# Patient Record
Sex: Male | Born: 1950 | Race: Black or African American | Hispanic: No | Marital: Married | Smoking: Former smoker
Health system: Southern US, Community
[De-identification: ages and names within clinical notes are randomized; demographics above are authoritative.]

## PROBLEM LIST (undated history)

## (undated) DIAGNOSIS — E785 Hyperlipidemia, unspecified: Secondary | ICD-10-CM

## (undated) DIAGNOSIS — I495 Sick sinus syndrome: Secondary | ICD-10-CM

## (undated) DIAGNOSIS — F17201 Nicotine dependence, unspecified, in remission: Secondary | ICD-10-CM

## (undated) DIAGNOSIS — I739 Peripheral vascular disease, unspecified: Secondary | ICD-10-CM

## (undated) DIAGNOSIS — M1990 Unspecified osteoarthritis, unspecified site: Secondary | ICD-10-CM

## (undated) DIAGNOSIS — G629 Polyneuropathy, unspecified: Secondary | ICD-10-CM

## (undated) DIAGNOSIS — D649 Anemia, unspecified: Secondary | ICD-10-CM

## (undated) DIAGNOSIS — I10 Essential (primary) hypertension: Secondary | ICD-10-CM

## (undated) DIAGNOSIS — I2510 Atherosclerotic heart disease of native coronary artery without angina pectoris: Secondary | ICD-10-CM

## (undated) DIAGNOSIS — E119 Type 2 diabetes mellitus without complications: Secondary | ICD-10-CM

## (undated) DIAGNOSIS — G4733 Obstructive sleep apnea (adult) (pediatric): Secondary | ICD-10-CM

## (undated) DIAGNOSIS — N400 Enlarged prostate without lower urinary tract symptoms: Secondary | ICD-10-CM

## (undated) DIAGNOSIS — K219 Gastro-esophageal reflux disease without esophagitis: Secondary | ICD-10-CM

## (undated) DIAGNOSIS — J449 Chronic obstructive pulmonary disease, unspecified: Secondary | ICD-10-CM

## (undated) DIAGNOSIS — E669 Obesity, unspecified: Secondary | ICD-10-CM

## (undated) DIAGNOSIS — I679 Cerebrovascular disease, unspecified: Secondary | ICD-10-CM

## (undated) HISTORY — DX: Type 2 diabetes mellitus without complications: E11.9

## (undated) HISTORY — DX: Obesity, unspecified: E66.9

## (undated) HISTORY — DX: Polyneuropathy, unspecified: G62.9

## (undated) HISTORY — DX: Peripheral vascular disease, unspecified: I73.9

## (undated) HISTORY — DX: Sick sinus syndrome: I49.5

## (undated) HISTORY — PX: ILIAC ARTERY STENT: SHX1786

## (undated) HISTORY — DX: Obstructive sleep apnea (adult) (pediatric): G47.33

## (undated) HISTORY — DX: Hyperlipidemia, unspecified: E78.5

## (undated) HISTORY — DX: Unspecified osteoarthritis, unspecified site: M19.90

## (undated) HISTORY — DX: Chronic obstructive pulmonary disease, unspecified: J44.9

## (undated) HISTORY — DX: Nicotine dependence, unspecified, in remission: F17.201

## (undated) HISTORY — DX: Atherosclerotic heart disease of native coronary artery without angina pectoris: I25.10

## (undated) HISTORY — DX: Cerebrovascular disease, unspecified: I67.9

## (undated) HISTORY — DX: Anemia, unspecified: D64.9

## (undated) HISTORY — DX: Gastro-esophageal reflux disease without esophagitis: K21.9

## (undated) HISTORY — DX: Benign prostatic hyperplasia without lower urinary tract symptoms: N40.0

## (undated) HISTORY — DX: Essential (primary) hypertension: I10

## (undated) SURGERY — Surgical Case
Anesthesia: *Unknown

---

## 1994-02-14 DIAGNOSIS — I495 Sick sinus syndrome: Secondary | ICD-10-CM

## 1994-02-14 HISTORY — DX: Sick sinus syndrome: I49.5

## 1994-02-14 HISTORY — PX: PACEMAKER INSERTION: SHX728

## 1996-02-15 HISTORY — PX: FEMORAL-POPLITEAL BYPASS GRAFT: SHX937

## 2001-02-14 DIAGNOSIS — I679 Cerebrovascular disease, unspecified: Secondary | ICD-10-CM

## 2001-02-14 HISTORY — PX: CAROTID ENDARTERECTOMY: SUR193

## 2001-02-14 HISTORY — DX: Cerebrovascular disease, unspecified: I67.9

## 2001-02-14 HISTORY — PX: CORONARY ARTERY BYPASS GRAFT: SHX141

## 2001-09-15 ENCOUNTER — Encounter (INDEPENDENT_AMBULATORY_CARE_PROVIDER_SITE_OTHER): Payer: Self-pay | Admitting: *Deleted

## 2001-09-15 ENCOUNTER — Inpatient Hospital Stay (HOSPITAL_COMMUNITY): Admission: AD | Admit: 2001-09-15 | Discharge: 2001-09-27 | Payer: Self-pay | Admitting: Cardiology

## 2001-09-15 ENCOUNTER — Encounter: Payer: Self-pay | Admitting: Internal Medicine

## 2001-09-18 ENCOUNTER — Encounter: Payer: Self-pay | Admitting: Cardiology

## 2001-09-20 ENCOUNTER — Encounter: Payer: Self-pay | Admitting: Cardiothoracic Surgery

## 2001-09-21 ENCOUNTER — Encounter: Payer: Self-pay | Admitting: Cardiothoracic Surgery

## 2001-09-22 ENCOUNTER — Encounter: Payer: Self-pay | Admitting: Cardiothoracic Surgery

## 2001-09-23 ENCOUNTER — Encounter: Payer: Self-pay | Admitting: Cardiothoracic Surgery

## 2001-09-24 ENCOUNTER — Encounter: Payer: Self-pay | Admitting: Cardiothoracic Surgery

## 2001-09-25 ENCOUNTER — Encounter: Payer: Self-pay | Admitting: Thoracic Surgery (Cardiothoracic Vascular Surgery)

## 2001-10-08 ENCOUNTER — Encounter: Payer: Self-pay | Admitting: Cardiology

## 2001-10-08 ENCOUNTER — Ambulatory Visit (HOSPITAL_COMMUNITY): Admission: RE | Admit: 2001-10-08 | Discharge: 2001-10-08 | Payer: Self-pay | Admitting: Cardiology

## 2001-10-18 ENCOUNTER — Encounter: Payer: Self-pay | Admitting: Emergency Medicine

## 2001-10-18 ENCOUNTER — Emergency Department (HOSPITAL_COMMUNITY): Admission: EM | Admit: 2001-10-18 | Discharge: 2001-10-18 | Payer: Self-pay | Admitting: Emergency Medicine

## 2001-10-18 ENCOUNTER — Inpatient Hospital Stay (HOSPITAL_COMMUNITY): Admission: EM | Admit: 2001-10-18 | Discharge: 2001-10-19 | Payer: Self-pay | Admitting: Cardiology

## 2002-08-28 ENCOUNTER — Emergency Department (HOSPITAL_COMMUNITY): Admission: EM | Admit: 2002-08-28 | Discharge: 2002-08-28 | Payer: Self-pay | Admitting: Emergency Medicine

## 2002-11-03 ENCOUNTER — Encounter: Payer: Self-pay | Admitting: Emergency Medicine

## 2002-11-03 ENCOUNTER — Emergency Department (HOSPITAL_COMMUNITY): Admission: EM | Admit: 2002-11-03 | Discharge: 2002-11-03 | Payer: Self-pay | Admitting: Emergency Medicine

## 2002-12-13 ENCOUNTER — Ambulatory Visit (HOSPITAL_COMMUNITY): Admission: RE | Admit: 2002-12-13 | Discharge: 2002-12-13 | Payer: Self-pay | Admitting: Family Medicine

## 2003-12-24 ENCOUNTER — Emergency Department (HOSPITAL_COMMUNITY): Admission: EM | Admit: 2003-12-24 | Discharge: 2003-12-25 | Payer: Self-pay | Admitting: Emergency Medicine

## 2004-02-10 ENCOUNTER — Encounter: Admission: RE | Admit: 2004-02-10 | Discharge: 2004-03-11 | Payer: Self-pay | Admitting: Family Medicine

## 2004-02-24 ENCOUNTER — Encounter (INDEPENDENT_AMBULATORY_CARE_PROVIDER_SITE_OTHER): Payer: Self-pay | Admitting: Family Medicine

## 2004-02-24 ENCOUNTER — Ambulatory Visit: Payer: Self-pay | Admitting: *Deleted

## 2004-02-26 ENCOUNTER — Encounter (INDEPENDENT_AMBULATORY_CARE_PROVIDER_SITE_OTHER): Payer: Self-pay | Admitting: Family Medicine

## 2004-02-26 ENCOUNTER — Ambulatory Visit (HOSPITAL_COMMUNITY): Admission: RE | Admit: 2004-02-26 | Discharge: 2004-02-26 | Payer: Self-pay | Admitting: *Deleted

## 2004-03-31 ENCOUNTER — Encounter (INDEPENDENT_AMBULATORY_CARE_PROVIDER_SITE_OTHER): Payer: Self-pay | Admitting: Family Medicine

## 2004-03-31 ENCOUNTER — Ambulatory Visit: Payer: Self-pay | Admitting: Internal Medicine

## 2004-04-12 ENCOUNTER — Emergency Department (HOSPITAL_COMMUNITY): Admission: EM | Admit: 2004-04-12 | Discharge: 2004-04-13 | Payer: Self-pay | Admitting: *Deleted

## 2004-04-14 ENCOUNTER — Encounter (INDEPENDENT_AMBULATORY_CARE_PROVIDER_SITE_OTHER): Payer: Self-pay | Admitting: Family Medicine

## 2004-04-14 ENCOUNTER — Ambulatory Visit: Payer: Self-pay | Admitting: *Deleted

## 2004-04-21 ENCOUNTER — Encounter (INDEPENDENT_AMBULATORY_CARE_PROVIDER_SITE_OTHER): Payer: Self-pay | Admitting: Family Medicine

## 2004-04-22 ENCOUNTER — Ambulatory Visit: Payer: Self-pay | Admitting: *Deleted

## 2004-05-12 ENCOUNTER — Encounter (INDEPENDENT_AMBULATORY_CARE_PROVIDER_SITE_OTHER): Payer: Self-pay | Admitting: Family Medicine

## 2004-05-12 ENCOUNTER — Ambulatory Visit: Payer: Self-pay | Admitting: *Deleted

## 2004-11-09 ENCOUNTER — Encounter (INDEPENDENT_AMBULATORY_CARE_PROVIDER_SITE_OTHER): Payer: Self-pay | Admitting: Family Medicine

## 2004-11-09 ENCOUNTER — Ambulatory Visit: Payer: Self-pay | Admitting: *Deleted

## 2005-02-14 DIAGNOSIS — I739 Peripheral vascular disease, unspecified: Secondary | ICD-10-CM

## 2005-02-14 HISTORY — PX: AORTA - BILATERAL FEMORAL ARTERY BYPASS GRAFT: SHX1175

## 2005-02-14 HISTORY — DX: Peripheral vascular disease, unspecified: I73.9

## 2005-04-28 ENCOUNTER — Ambulatory Visit: Payer: Self-pay | Admitting: Cardiology

## 2005-04-28 ENCOUNTER — Encounter (INDEPENDENT_AMBULATORY_CARE_PROVIDER_SITE_OTHER): Payer: Self-pay | Admitting: Family Medicine

## 2005-06-06 ENCOUNTER — Emergency Department (HOSPITAL_COMMUNITY): Admission: EM | Admit: 2005-06-06 | Discharge: 2005-06-06 | Payer: Self-pay | Admitting: Emergency Medicine

## 2005-06-08 ENCOUNTER — Encounter (INDEPENDENT_AMBULATORY_CARE_PROVIDER_SITE_OTHER): Payer: Self-pay | Admitting: Family Medicine

## 2005-06-08 ENCOUNTER — Ambulatory Visit: Payer: Self-pay | Admitting: *Deleted

## 2005-06-11 ENCOUNTER — Emergency Department (HOSPITAL_COMMUNITY): Admission: EM | Admit: 2005-06-11 | Discharge: 2005-06-11 | Payer: Self-pay | Admitting: Emergency Medicine

## 2005-06-24 ENCOUNTER — Ambulatory Visit (HOSPITAL_COMMUNITY): Admission: RE | Admit: 2005-06-24 | Discharge: 2005-06-24 | Payer: Self-pay | Admitting: *Deleted

## 2005-06-26 ENCOUNTER — Emergency Department (HOSPITAL_COMMUNITY): Admission: EM | Admit: 2005-06-26 | Discharge: 2005-06-26 | Payer: Self-pay | Admitting: Emergency Medicine

## 2005-07-05 ENCOUNTER — Ambulatory Visit: Payer: Self-pay | Admitting: Cardiology

## 2005-07-06 ENCOUNTER — Inpatient Hospital Stay (HOSPITAL_COMMUNITY): Admission: AD | Admit: 2005-07-06 | Discharge: 2005-07-19 | Payer: Self-pay | Admitting: *Deleted

## 2005-07-07 ENCOUNTER — Encounter: Payer: Self-pay | Admitting: Internal Medicine

## 2005-07-12 ENCOUNTER — Encounter (INDEPENDENT_AMBULATORY_CARE_PROVIDER_SITE_OTHER): Payer: Self-pay | Admitting: Specialist

## 2005-09-11 ENCOUNTER — Encounter (INDEPENDENT_AMBULATORY_CARE_PROVIDER_SITE_OTHER): Payer: Self-pay | Admitting: Family Medicine

## 2005-09-21 ENCOUNTER — Ambulatory Visit: Payer: Self-pay | Admitting: Family Medicine

## 2005-10-03 ENCOUNTER — Encounter (INDEPENDENT_AMBULATORY_CARE_PROVIDER_SITE_OTHER): Payer: Self-pay | Admitting: Family Medicine

## 2005-10-05 ENCOUNTER — Encounter (INDEPENDENT_AMBULATORY_CARE_PROVIDER_SITE_OTHER): Payer: Self-pay | Admitting: Family Medicine

## 2005-10-06 ENCOUNTER — Ambulatory Visit: Payer: Self-pay | Admitting: Family Medicine

## 2005-10-19 ENCOUNTER — Ambulatory Visit: Payer: Self-pay | Admitting: Family Medicine

## 2005-10-24 ENCOUNTER — Encounter (INDEPENDENT_AMBULATORY_CARE_PROVIDER_SITE_OTHER): Payer: Self-pay | Admitting: Family Medicine

## 2005-11-05 ENCOUNTER — Encounter (INDEPENDENT_AMBULATORY_CARE_PROVIDER_SITE_OTHER): Payer: Self-pay | Admitting: Family Medicine

## 2005-11-10 ENCOUNTER — Ambulatory Visit: Payer: Self-pay | Admitting: Family Medicine

## 2005-11-24 ENCOUNTER — Telehealth (INDEPENDENT_AMBULATORY_CARE_PROVIDER_SITE_OTHER): Payer: Self-pay | Admitting: Family Medicine

## 2005-11-30 ENCOUNTER — Telehealth (INDEPENDENT_AMBULATORY_CARE_PROVIDER_SITE_OTHER): Payer: Self-pay | Admitting: Family Medicine

## 2005-12-22 ENCOUNTER — Ambulatory Visit: Payer: Self-pay | Admitting: Family Medicine

## 2006-01-12 ENCOUNTER — Encounter (INDEPENDENT_AMBULATORY_CARE_PROVIDER_SITE_OTHER): Payer: Self-pay | Admitting: Family Medicine

## 2006-01-18 ENCOUNTER — Encounter: Payer: Self-pay | Admitting: Family Medicine

## 2006-01-18 DIAGNOSIS — I10 Essential (primary) hypertension: Secondary | ICD-10-CM

## 2006-01-18 DIAGNOSIS — E1140 Type 2 diabetes mellitus with diabetic neuropathy, unspecified: Secondary | ICD-10-CM

## 2006-01-19 ENCOUNTER — Ambulatory Visit: Payer: Self-pay | Admitting: Family Medicine

## 2006-02-20 ENCOUNTER — Encounter (INDEPENDENT_AMBULATORY_CARE_PROVIDER_SITE_OTHER): Payer: Self-pay | Admitting: Family Medicine

## 2006-03-23 ENCOUNTER — Encounter (INDEPENDENT_AMBULATORY_CARE_PROVIDER_SITE_OTHER): Payer: Self-pay | Admitting: Family Medicine

## 2006-03-27 ENCOUNTER — Encounter (INDEPENDENT_AMBULATORY_CARE_PROVIDER_SITE_OTHER): Payer: Self-pay | Admitting: Family Medicine

## 2006-04-10 ENCOUNTER — Encounter (INDEPENDENT_AMBULATORY_CARE_PROVIDER_SITE_OTHER): Payer: Self-pay | Admitting: Family Medicine

## 2006-04-12 ENCOUNTER — Encounter (INDEPENDENT_AMBULATORY_CARE_PROVIDER_SITE_OTHER): Payer: Self-pay | Admitting: Family Medicine

## 2006-04-20 ENCOUNTER — Ambulatory Visit: Payer: Self-pay | Admitting: Family Medicine

## 2006-04-20 DIAGNOSIS — E785 Hyperlipidemia, unspecified: Secondary | ICD-10-CM

## 2006-04-20 LAB — CONVERTED CEMR LAB
Cholesterol, target level: 200 mg/dL
Glucose, Bld: 144 mg/dL
HDL goal, serum: 40 mg/dL
LDL Goal: 70 mg/dL

## 2006-04-27 ENCOUNTER — Encounter (INDEPENDENT_AMBULATORY_CARE_PROVIDER_SITE_OTHER): Payer: Self-pay | Admitting: Family Medicine

## 2006-04-28 ENCOUNTER — Encounter (INDEPENDENT_AMBULATORY_CARE_PROVIDER_SITE_OTHER): Payer: Self-pay | Admitting: Family Medicine

## 2006-05-01 ENCOUNTER — Telehealth (INDEPENDENT_AMBULATORY_CARE_PROVIDER_SITE_OTHER): Payer: Self-pay | Admitting: Family Medicine

## 2006-05-03 ENCOUNTER — Encounter (INDEPENDENT_AMBULATORY_CARE_PROVIDER_SITE_OTHER): Payer: Self-pay | Admitting: Family Medicine

## 2006-05-08 ENCOUNTER — Telehealth (INDEPENDENT_AMBULATORY_CARE_PROVIDER_SITE_OTHER): Payer: Self-pay | Admitting: Family Medicine

## 2006-05-08 ENCOUNTER — Encounter (INDEPENDENT_AMBULATORY_CARE_PROVIDER_SITE_OTHER): Payer: Self-pay | Admitting: Family Medicine

## 2006-05-23 ENCOUNTER — Ambulatory Visit: Payer: Self-pay | Admitting: Family Medicine

## 2006-06-09 ENCOUNTER — Encounter (INDEPENDENT_AMBULATORY_CARE_PROVIDER_SITE_OTHER): Payer: Self-pay | Admitting: Family Medicine

## 2006-06-13 ENCOUNTER — Encounter (INDEPENDENT_AMBULATORY_CARE_PROVIDER_SITE_OTHER): Payer: Self-pay | Admitting: Family Medicine

## 2006-06-19 ENCOUNTER — Encounter (INDEPENDENT_AMBULATORY_CARE_PROVIDER_SITE_OTHER): Payer: Self-pay | Admitting: Family Medicine

## 2006-07-05 ENCOUNTER — Encounter (INDEPENDENT_AMBULATORY_CARE_PROVIDER_SITE_OTHER): Payer: Self-pay | Admitting: Family Medicine

## 2006-07-11 ENCOUNTER — Encounter (INDEPENDENT_AMBULATORY_CARE_PROVIDER_SITE_OTHER): Payer: Self-pay | Admitting: Family Medicine

## 2006-08-11 ENCOUNTER — Encounter (INDEPENDENT_AMBULATORY_CARE_PROVIDER_SITE_OTHER): Payer: Self-pay | Admitting: Family Medicine

## 2006-08-17 ENCOUNTER — Encounter (INDEPENDENT_AMBULATORY_CARE_PROVIDER_SITE_OTHER): Payer: Self-pay | Admitting: Family Medicine

## 2006-10-02 ENCOUNTER — Encounter (INDEPENDENT_AMBULATORY_CARE_PROVIDER_SITE_OTHER): Payer: Self-pay | Admitting: Family Medicine

## 2006-11-07 ENCOUNTER — Encounter (INDEPENDENT_AMBULATORY_CARE_PROVIDER_SITE_OTHER): Payer: Self-pay | Admitting: Family Medicine

## 2006-11-14 ENCOUNTER — Ambulatory Visit: Payer: Self-pay | Admitting: Family Medicine

## 2006-11-21 ENCOUNTER — Encounter (INDEPENDENT_AMBULATORY_CARE_PROVIDER_SITE_OTHER): Payer: Self-pay | Admitting: Family Medicine

## 2006-11-23 ENCOUNTER — Telehealth (INDEPENDENT_AMBULATORY_CARE_PROVIDER_SITE_OTHER): Payer: Self-pay | Admitting: Family Medicine

## 2006-11-23 ENCOUNTER — Encounter (INDEPENDENT_AMBULATORY_CARE_PROVIDER_SITE_OTHER): Payer: Self-pay | Admitting: Family Medicine

## 2006-11-24 ENCOUNTER — Ambulatory Visit: Payer: Self-pay | Admitting: Family Medicine

## 2006-11-24 ENCOUNTER — Telehealth (INDEPENDENT_AMBULATORY_CARE_PROVIDER_SITE_OTHER): Payer: Self-pay | Admitting: *Deleted

## 2006-11-24 LAB — CONVERTED CEMR LAB
Alkaline Phosphatase: 46 units/L (ref 39–117)
Basophils Relative: 1 % (ref 0–1)
Eosinophils Absolute: 0.1 10*3/uL (ref 0.0–0.7)
Eosinophils Relative: 3 % (ref 0–5)
Glucose, Bld: 154 mg/dL — ABNORMAL HIGH (ref 70–99)
HCT: 34.6 % — ABNORMAL LOW (ref 39.0–52.0)
HDL: 27 mg/dL — ABNORMAL LOW (ref >39)
LDL Cholesterol: 65 mg/dL (ref 0–99)
Lymphs Abs: 2.2 10*3/uL (ref 0.7–3.3)
MCHC: 32.9 g/dL (ref 30.0–36.0)
MCV: 85.4 fL (ref 78.0–100.0)
Nitrite: NEGATIVE
Platelets: 245 10*3/uL (ref 150–400)
Protein, U semiquant: 30
RDW: 12.9 % (ref 11.5–14.0)
Sodium: 140 meq/L (ref 135–145)
Total Bilirubin: 0.6 mg/dL (ref 0.3–1.2)
Total Protein: 7.1 g/dL (ref 6.0–8.3)
Triglycerides: 326 mg/dL — ABNORMAL HIGH (ref <150)
Urobilinogen, UA: 4
VLDL: 65 mg/dL — ABNORMAL HIGH (ref 0–40)
WBC: 4.7 10*3/uL (ref 4.0–10.5)
pH: 6

## 2006-11-25 ENCOUNTER — Encounter (INDEPENDENT_AMBULATORY_CARE_PROVIDER_SITE_OTHER): Payer: Self-pay | Admitting: Family Medicine

## 2006-11-26 LAB — CONVERTED CEMR LAB
Iron: 90 ug/dL (ref 42–165)
Microalb Creat Ratio: 49.3 mg/g — ABNORMAL HIGH (ref 0.0–30.0)
Saturation Ratios: 30 % (ref 20–55)
TIBC: 304 ug/dL (ref 215–435)

## 2006-11-27 ENCOUNTER — Encounter (INDEPENDENT_AMBULATORY_CARE_PROVIDER_SITE_OTHER): Payer: Self-pay | Admitting: Family Medicine

## 2006-11-27 ENCOUNTER — Telehealth (INDEPENDENT_AMBULATORY_CARE_PROVIDER_SITE_OTHER): Payer: Self-pay | Admitting: *Deleted

## 2006-12-06 ENCOUNTER — Ambulatory Visit: Payer: Self-pay | Admitting: Internal Medicine

## 2007-01-31 ENCOUNTER — Encounter (INDEPENDENT_AMBULATORY_CARE_PROVIDER_SITE_OTHER): Payer: Self-pay | Admitting: Family Medicine

## 2007-02-12 ENCOUNTER — Encounter (INDEPENDENT_AMBULATORY_CARE_PROVIDER_SITE_OTHER): Payer: Self-pay | Admitting: Family Medicine

## 2007-02-20 ENCOUNTER — Ambulatory Visit (HOSPITAL_COMMUNITY): Admission: RE | Admit: 2007-02-20 | Discharge: 2007-02-20 | Payer: Self-pay | Admitting: Internal Medicine

## 2007-02-20 ENCOUNTER — Ambulatory Visit: Payer: Self-pay | Admitting: Internal Medicine

## 2007-02-20 HISTORY — PX: OTHER SURGICAL HISTORY: SHX169

## 2007-02-24 ENCOUNTER — Encounter (INDEPENDENT_AMBULATORY_CARE_PROVIDER_SITE_OTHER): Payer: Self-pay | Admitting: Family Medicine

## 2007-02-27 ENCOUNTER — Encounter (INDEPENDENT_AMBULATORY_CARE_PROVIDER_SITE_OTHER): Payer: Self-pay | Admitting: Family Medicine

## 2007-03-27 ENCOUNTER — Ambulatory Visit: Payer: Self-pay | Admitting: Family Medicine

## 2007-03-28 ENCOUNTER — Ambulatory Visit (HOSPITAL_COMMUNITY): Admission: RE | Admit: 2007-03-28 | Discharge: 2007-03-28 | Payer: Self-pay | Admitting: Family Medicine

## 2007-03-29 ENCOUNTER — Telehealth (INDEPENDENT_AMBULATORY_CARE_PROVIDER_SITE_OTHER): Payer: Self-pay | Admitting: *Deleted

## 2007-04-03 ENCOUNTER — Ambulatory Visit: Payer: Self-pay | Admitting: Family Medicine

## 2007-04-05 ENCOUNTER — Encounter (INDEPENDENT_AMBULATORY_CARE_PROVIDER_SITE_OTHER): Payer: Self-pay | Admitting: Family Medicine

## 2007-04-06 ENCOUNTER — Encounter (INDEPENDENT_AMBULATORY_CARE_PROVIDER_SITE_OTHER): Payer: Self-pay | Admitting: Family Medicine

## 2007-04-11 ENCOUNTER — Encounter (INDEPENDENT_AMBULATORY_CARE_PROVIDER_SITE_OTHER): Payer: Self-pay | Admitting: Family Medicine

## 2007-04-13 ENCOUNTER — Encounter (INDEPENDENT_AMBULATORY_CARE_PROVIDER_SITE_OTHER): Payer: Self-pay | Admitting: Family Medicine

## 2007-04-13 LAB — CONVERTED CEMR LAB
AST: 14 units/L (ref 0–37)
BUN: 18 mg/dL (ref 6–23)
Basophils Relative: 1 % (ref 0–1)
Calcium: 9.6 mg/dL (ref 8.4–10.5)
Chloride: 104 meq/L (ref 96–112)
Creatinine, Ser: 1.11 mg/dL (ref 0.40–1.50)
Eosinophils Absolute: 0.1 10*3/uL (ref 0.0–0.7)
Glucose, Bld: 120 mg/dL — ABNORMAL HIGH (ref 70–99)
HDL: 30 mg/dL — ABNORMAL LOW (ref >39)
Hemoglobin: 12.5 g/dL — ABNORMAL LOW (ref 13.0–17.0)
MCHC: 31.5 g/dL (ref 30.0–36.0)
MCV: 85.2 fL (ref 78.0–100.0)
Monocytes Absolute: 0.4 10*3/uL (ref 0.1–1.0)
Monocytes Relative: 9 % (ref 3–12)
Neutro Abs: 1.9 10*3/uL (ref 1.7–7.7)
RBC: 4.66 M/uL (ref 4.22–5.81)
Total CHOL/HDL Ratio: 5.5
Triglycerides: 318 mg/dL — ABNORMAL HIGH (ref <150)

## 2007-05-04 ENCOUNTER — Encounter (INDEPENDENT_AMBULATORY_CARE_PROVIDER_SITE_OTHER): Payer: Self-pay | Admitting: Family Medicine

## 2007-05-07 ENCOUNTER — Telehealth (INDEPENDENT_AMBULATORY_CARE_PROVIDER_SITE_OTHER): Payer: Self-pay | Admitting: *Deleted

## 2007-05-07 ENCOUNTER — Encounter (INDEPENDENT_AMBULATORY_CARE_PROVIDER_SITE_OTHER): Payer: Self-pay | Admitting: Family Medicine

## 2007-05-21 ENCOUNTER — Ambulatory Visit: Payer: Self-pay | Admitting: Family Medicine

## 2007-05-31 ENCOUNTER — Ambulatory Visit: Payer: Self-pay | Admitting: Cardiovascular Disease

## 2007-06-01 ENCOUNTER — Emergency Department (HOSPITAL_COMMUNITY): Admission: EM | Admit: 2007-06-01 | Discharge: 2007-06-01 | Payer: Self-pay | Admitting: Emergency Medicine

## 2007-06-12 ENCOUNTER — Encounter (INDEPENDENT_AMBULATORY_CARE_PROVIDER_SITE_OTHER): Payer: Self-pay | Admitting: Family Medicine

## 2007-06-14 ENCOUNTER — Ambulatory Visit: Payer: Self-pay | Admitting: Family Medicine

## 2007-07-26 ENCOUNTER — Ambulatory Visit: Payer: Self-pay | Admitting: Family Medicine

## 2007-07-31 ENCOUNTER — Ambulatory Visit: Payer: Self-pay | Admitting: Cardiology

## 2007-08-01 ENCOUNTER — Encounter (INDEPENDENT_AMBULATORY_CARE_PROVIDER_SITE_OTHER): Payer: Self-pay | Admitting: Family Medicine

## 2007-08-02 ENCOUNTER — Encounter (HOSPITAL_COMMUNITY): Admission: RE | Admit: 2007-08-02 | Discharge: 2007-09-01 | Payer: Self-pay | Admitting: Cardiology

## 2007-08-02 ENCOUNTER — Encounter (INDEPENDENT_AMBULATORY_CARE_PROVIDER_SITE_OTHER): Payer: Self-pay | Admitting: Family Medicine

## 2007-08-02 ENCOUNTER — Ambulatory Visit: Payer: Self-pay | Admitting: Cardiology

## 2007-08-06 LAB — CONVERTED CEMR LAB
AST: 14 units/L (ref 0–37)
Albumin: 4.2 g/dL (ref 3.5–5.2)
BUN: 18 mg/dL (ref 6–23)
CO2: 23 meq/L (ref 19–32)
Calcium: 9.6 mg/dL (ref 8.4–10.5)
Chloride: 103 meq/L (ref 96–112)
Cholesterol: 164 mg/dL (ref 0–200)
Creatinine, Ser: 1.14 mg/dL (ref 0.40–1.50)
Glucose, Bld: 296 mg/dL — ABNORMAL HIGH (ref 70–99)
HDL: 27 mg/dL — ABNORMAL LOW (ref >39)
Total CHOL/HDL Ratio: 6.1
Triglycerides: 404 mg/dL — ABNORMAL HIGH (ref <150)

## 2007-08-20 ENCOUNTER — Encounter (INDEPENDENT_AMBULATORY_CARE_PROVIDER_SITE_OTHER): Payer: Self-pay | Admitting: Family Medicine

## 2007-08-22 ENCOUNTER — Ambulatory Visit: Payer: Self-pay | Admitting: Cardiology

## 2007-08-24 ENCOUNTER — Telehealth (INDEPENDENT_AMBULATORY_CARE_PROVIDER_SITE_OTHER): Payer: Self-pay | Admitting: Family Medicine

## 2007-08-24 ENCOUNTER — Encounter (INDEPENDENT_AMBULATORY_CARE_PROVIDER_SITE_OTHER): Payer: Self-pay | Admitting: Family Medicine

## 2007-08-27 ENCOUNTER — Encounter (INDEPENDENT_AMBULATORY_CARE_PROVIDER_SITE_OTHER): Payer: Self-pay | Admitting: Family Medicine

## 2007-09-11 ENCOUNTER — Encounter (HOSPITAL_COMMUNITY): Admission: RE | Admit: 2007-09-11 | Discharge: 2007-10-11 | Payer: Self-pay | Admitting: Family Medicine

## 2007-09-11 ENCOUNTER — Encounter (INDEPENDENT_AMBULATORY_CARE_PROVIDER_SITE_OTHER): Payer: Self-pay | Admitting: Family Medicine

## 2007-09-18 ENCOUNTER — Ambulatory Visit: Payer: Self-pay | Admitting: Family Medicine

## 2007-10-04 ENCOUNTER — Ambulatory Visit: Payer: Self-pay | Admitting: Cardiology

## 2007-10-09 ENCOUNTER — Encounter (INDEPENDENT_AMBULATORY_CARE_PROVIDER_SITE_OTHER): Payer: Self-pay | Admitting: Family Medicine

## 2007-10-11 ENCOUNTER — Ambulatory Visit: Payer: Self-pay | Admitting: *Deleted

## 2007-10-16 ENCOUNTER — Encounter (HOSPITAL_COMMUNITY): Admission: RE | Admit: 2007-10-16 | Discharge: 2007-11-12 | Payer: Self-pay | Admitting: Family Medicine

## 2007-10-17 ENCOUNTER — Encounter (INDEPENDENT_AMBULATORY_CARE_PROVIDER_SITE_OTHER): Payer: Self-pay | Admitting: Family Medicine

## 2007-10-23 ENCOUNTER — Encounter (INDEPENDENT_AMBULATORY_CARE_PROVIDER_SITE_OTHER): Payer: Self-pay | Admitting: Family Medicine

## 2007-10-30 ENCOUNTER — Ambulatory Visit: Payer: Self-pay | Admitting: Family Medicine

## 2007-10-31 ENCOUNTER — Encounter (INDEPENDENT_AMBULATORY_CARE_PROVIDER_SITE_OTHER): Payer: Self-pay | Admitting: Family Medicine

## 2007-11-16 ENCOUNTER — Encounter (INDEPENDENT_AMBULATORY_CARE_PROVIDER_SITE_OTHER): Payer: Self-pay | Admitting: Family Medicine

## 2007-11-19 ENCOUNTER — Ambulatory Visit: Payer: Self-pay | Admitting: Family Medicine

## 2007-12-24 ENCOUNTER — Encounter (INDEPENDENT_AMBULATORY_CARE_PROVIDER_SITE_OTHER): Payer: Self-pay | Admitting: Family Medicine

## 2008-02-15 HISTORY — PX: PACEMAKER GENERATOR CHANGE: SHX5998

## 2008-04-11 ENCOUNTER — Encounter: Payer: Self-pay | Admitting: Internal Medicine

## 2008-04-15 ENCOUNTER — Ambulatory Visit: Payer: Self-pay | Admitting: Internal Medicine

## 2008-04-22 ENCOUNTER — Ambulatory Visit (HOSPITAL_COMMUNITY): Admission: RE | Admit: 2008-04-22 | Discharge: 2008-04-22 | Payer: Self-pay | Admitting: Internal Medicine

## 2008-04-22 ENCOUNTER — Ambulatory Visit: Payer: Self-pay | Admitting: Internal Medicine

## 2008-04-25 ENCOUNTER — Ambulatory Visit: Payer: Self-pay | Admitting: Family Medicine

## 2008-04-27 ENCOUNTER — Encounter (INDEPENDENT_AMBULATORY_CARE_PROVIDER_SITE_OTHER): Payer: Self-pay | Admitting: Family Medicine

## 2008-05-02 ENCOUNTER — Encounter (INDEPENDENT_AMBULATORY_CARE_PROVIDER_SITE_OTHER): Payer: Self-pay | Admitting: Family Medicine

## 2008-05-05 ENCOUNTER — Encounter (INDEPENDENT_AMBULATORY_CARE_PROVIDER_SITE_OTHER): Payer: Self-pay | Admitting: Family Medicine

## 2008-05-06 ENCOUNTER — Encounter (INDEPENDENT_AMBULATORY_CARE_PROVIDER_SITE_OTHER): Payer: Self-pay | Admitting: Family Medicine

## 2008-05-07 LAB — CONVERTED CEMR LAB
ALT: 11 units/L (ref 0–53)
Albumin: 4.3 g/dL (ref 3.5–5.2)
Alkaline Phosphatase: 40 units/L (ref 39–117)
Basophils Absolute: 0.1 10*3/uL (ref 0.0–0.1)
Basophils Relative: 1 % (ref 0–1)
CO2: 26 meq/L (ref 19–32)
Creatinine, Urine: 109.7 mg/dL
Glucose, Bld: 236 mg/dL — ABNORMAL HIGH (ref 70–99)
Hemoglobin: 13.1 g/dL (ref 13.0–17.0)
LDL Cholesterol: 94 mg/dL (ref 0–99)
Lymphocytes Relative: 46 % (ref 12–46)
Microalb Creat Ratio: 15.4 mg/g (ref 0.0–30.0)
Microalb, Ur: 1.69 mg/dL (ref 0.00–1.89)
Monocytes Absolute: 0.4 10*3/uL (ref 0.1–1.0)
Monocytes Relative: 8 % (ref 3–12)
Neutro Abs: 2 10*3/uL (ref 1.7–7.7)
Neutrophils Relative %: 41 % — ABNORMAL LOW (ref 43–77)
PSA: 0.59 ng/mL (ref 0.10–4.00)
Potassium: 4.2 meq/L (ref 3.5–5.3)
RBC: 4.79 M/uL (ref 4.22–5.81)
Sodium: 138 meq/L (ref 135–145)
Total Bilirubin: 0.4 mg/dL (ref 0.3–1.2)
Total Protein: 7.7 g/dL (ref 6.0–8.3)
Triglycerides: 290 mg/dL — ABNORMAL HIGH (ref <150)
VLDL: 58 mg/dL — ABNORMAL HIGH (ref 0–40)

## 2008-05-23 ENCOUNTER — Encounter (INDEPENDENT_AMBULATORY_CARE_PROVIDER_SITE_OTHER): Payer: Self-pay | Admitting: Family Medicine

## 2008-05-28 ENCOUNTER — Ambulatory Visit: Payer: Self-pay | Admitting: Internal Medicine

## 2008-06-09 ENCOUNTER — Encounter (INDEPENDENT_AMBULATORY_CARE_PROVIDER_SITE_OTHER): Payer: Self-pay | Admitting: Family Medicine

## 2008-06-19 ENCOUNTER — Ambulatory Visit: Payer: Self-pay | Admitting: Family Medicine

## 2008-06-26 ENCOUNTER — Ambulatory Visit: Payer: Self-pay | Admitting: *Deleted

## 2008-07-28 ENCOUNTER — Emergency Department (HOSPITAL_COMMUNITY): Admission: EM | Admit: 2008-07-28 | Discharge: 2008-07-28 | Payer: Self-pay | Admitting: Emergency Medicine

## 2008-08-19 ENCOUNTER — Ambulatory Visit: Payer: Self-pay | Admitting: Family Medicine

## 2008-08-19 LAB — CONVERTED CEMR LAB: Hgb A1c MFr Bld: 12.6 %

## 2008-09-02 ENCOUNTER — Encounter (INDEPENDENT_AMBULATORY_CARE_PROVIDER_SITE_OTHER): Payer: Self-pay | Admitting: Family Medicine

## 2008-09-22 ENCOUNTER — Encounter: Payer: Self-pay | Admitting: Cardiology

## 2009-09-14 ENCOUNTER — Encounter (INDEPENDENT_AMBULATORY_CARE_PROVIDER_SITE_OTHER): Payer: Self-pay | Admitting: *Deleted

## 2009-10-22 ENCOUNTER — Emergency Department (HOSPITAL_COMMUNITY): Admission: EM | Admit: 2009-10-22 | Discharge: 2009-10-22 | Payer: Self-pay | Admitting: Emergency Medicine

## 2010-02-14 HISTORY — PX: COLONOSCOPY: SHX174

## 2010-02-24 ENCOUNTER — Encounter (INDEPENDENT_AMBULATORY_CARE_PROVIDER_SITE_OTHER): Payer: Self-pay | Admitting: *Deleted

## 2010-03-14 LAB — CONVERTED CEMR LAB
Glucose, Bld: 201 mg/dL
Hgb A1c MFr Bld: 7.6 %
TSH: 2.741 microintl units/mL

## 2010-03-16 NOTE — Letter (Signed)
Summary: Device-Delinquent Check  [facility], Main Office  [address]   [place], [place] [address]   Phone: [phone]  Fax: [phone]     September 14, 2009 MRN: [id number]   [name] [address] [place], [place]  [address]   Dear Mr. LAUFER,  According to our records, you have not had your implanted device checked in the recommended period of time.  We are unable to determine appropriate device function without checking your device on a regular basis.  Please call our office to schedule an appointment as soon as possible.  If you are having your device checked by another physician, please call us so that we may update our records.  Thank you,   [facility]

## 2010-03-18 NOTE — Letter (Signed)
Summary: Device-Delinquent Check  [facility], Main Office  [address]   [place], [place] [address]   Phone: [phone]  Fax: [phone]     February 24, 2010 MRN: [id number]   [name] [address] [place], [place]  [address]   Dear Mr. WIDMER,  According to our records, you have not had your implanted device checked in the recommended period of time.  We are unable to determine appropriate device function without checking your device on a regular basis.  Please call our office to schedule an appointment, with Dr Ladona Ridgel, as soon as possible.  If you are having your device checked by another physician, please call us so that we may update our records.  Thank you,  Letta Moynahan, EMT  February 24, 2010 4:35 PM  [facility] certified

## 2010-04-29 LAB — DIFFERENTIAL
Basophils Absolute: 0 10*3/uL (ref 0.0–0.1)
Basophils Relative: 1 % (ref 0–1)
Eosinophils Absolute: 0.1 10*3/uL (ref 0.0–0.7)
Monocytes Absolute: 0.4 10*3/uL (ref 0.1–1.0)
Monocytes Relative: 9 % (ref 3–12)
Neutro Abs: 2 10*3/uL (ref 1.7–7.7)
Neutrophils Relative %: 45 % (ref 43–77)

## 2010-04-29 LAB — CBC
Hemoglobin: 12.4 g/dL — ABNORMAL LOW (ref 13.0–17.0)
MCH: 28.3 pg (ref 26.0–34.0)
MCHC: 33.7 g/dL (ref 30.0–36.0)
RDW: 11.9 % (ref 11.5–15.5)

## 2010-04-29 LAB — URINALYSIS, ROUTINE W REFLEX MICROSCOPIC
Bilirubin Urine: NEGATIVE
Glucose, UA: >1000 mg/dL — AB
Ketones, ur: NEGATIVE mg/dL
Leukocytes, UA: NEGATIVE
Nitrite: NEGATIVE
Protein, ur: NEGATIVE mg/dL

## 2010-04-29 LAB — BASIC METABOLIC PANEL
CO2: 29 mEq/L (ref 19–32)
Calcium: 9.9 mg/dL (ref 8.4–10.5)
Chloride: 103 mEq/L (ref 96–112)
Glucose, Bld: 282 mg/dL — ABNORMAL HIGH (ref 70–99)
Sodium: 137 mEq/L (ref 135–145)

## 2010-04-29 LAB — POCT CARDIAC MARKERS: CKMB, poc: 2.7 ng/mL (ref 1.0–8.0)

## 2010-05-27 LAB — GLUCOSE, CAPILLARY: Glucose-Capillary: 213 mg/dL — ABNORMAL HIGH (ref 70–99)

## 2010-06-29 NOTE — Discharge Summary (Signed)
NAME:  [name], [name] NO.:  [id number]   MEDICAL RECORD NO.:  [id number]          PATIENT TYPE:  OIB   LOCATION:  2899                         FACILITY:  [facility]   PHYSICIAN:  Doylene Canning. Ladona Ridgel, MD    DATE OF BIRTH:  [birth year]   DATE OF ADMISSION:  04/22/2008  DATE OF DISCHARGE:  04/22/2008                               DISCHARGE SUMMARY   This patient has no known drug allergies.  Time for this dictation  greater than 40 minutes.   FINAL DIAGNOSES:  1. History of symptomatic bradycardia with pacemaker implanted 14      years ago, at elective replacement indicator.  2. Discharging after generator change out.      a.     Explanted St. Jude Synchrony II.      b.     Implanted St. Jude ACCENT DR dual-chamber pacemaker, Dr.       Lewayne Bunting.   SECONDARY DIAGNOSES:  1. History of three-vessel coronary artery disease, status post      coronary artery bypass graft surgery in 2003.  2. 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      a.     Right carotid endarterectomy in the past.      b.     Carotid duplex in August 2009.  The right shows no evidence       of restenosis.  Left internal carotid artery has 60-79% stenosis.  3. History of claudication bilateral femoral-popliteal bypass      surgeries in 1998, both occluded.  4. Aortobifemoral bypass in May 2007.  5. A 2-D echocardiogram in May 2007, ejection fraction 55-65%.  6. Chronic obstructive pulmonary disease.  7. Obstructive sleep apnea.  8. Benign prostatic hypertrophy.  9. Gastroesophageal reflux disease with erosive gastritis.  10.Myoview study in June 2009, no ischemia, small inferior infarct.  11.Diabetes.  12.Hypertension.  13.Dyslipidemia.  14.Hypertriglyceridemia.  15.Ongoing tobacco habituation.   PROCEDURE:  April 22, 2008, generator change for pacemaker at elective  replacement indicator, implanted St. Jude ACCENT DR  dual-chamber  pacemaker by Dr. Lewayne Bunting.  The patient has no postprocedural  complications.  He is asked to hold his Plavix for an additional 72  hours.   BRIEF HISTORY:  [name] is a 60 year old male.  He has pan vascular  arterial occlusive disease.   He has a history of symptomatic bradycardia.  He had permanent pacemaker  insertion 14 years ago.  He returns today for followup on April 15, 2008.  He has no specific complaints except for occasional periods of fatigue  and weakness.  His pacemaker was interrogated and it shows that it is at  elective replacement indicator.  He needs a generator change.  The  risks, benefits, and expectations have been described to the patient and  we will plan on doing this at the first convenient opportunity, but will  hold his Plavix 3 days prior to the procedure.   HOSPITAL COURSE:  The patient presents electively on April 22, 2008.  He  underwent generator  change by Dr. Ladona Ridgel as described above.  He has had  no postprocedural complications.  He discharged on his preoperative  medications except for Plavix which he will hold for an additional 72  hours.  He will go home on antibiotic Keflex 500 mg 1 p.o. t.i.d. for a  5-day period.  Otherwise, he continues his other medications which are  as follows.  1. Enteric-coated aspirin 325 mg daily.  2. Metformin 1 g twice daily.  3. Glipizide 10 mg twice daily.  4. Lyrica 150 mg twice daily.  5. Verapamil 240 mg daily.  6. Tricor 145 mg daily.  7. Lisinopril 40 mg daily.  8. Hydrochlorothiazide 25 mg daily.  9. Plavix 75 mg daily, hold off until Friday, April 25, 2008, and then      restart.   He is asked to keep his incision dry for next 7 days and to sponge bathe  until Tuesday, April 29, 2008.  He follows up at [facility] on Thursday, April 24, 2008, at 3 o'clock.  Lab  studies pertinent to this admission were drawn on April 15, 2008.  White  cells of 5,  hemoglobin 12.6, hematocrit 37.6, and platelets of 304.  Protime 12.8 and INR is 1.0.  Sodium 136, potassium 4.4, chloride 101,  chloride 29, glucose 287, BUN is 15, and creatinine 1.47.  I would note  that the patient's glucose on admission was 300.  He received 11 units  of NovoLog with a recheck.  It is very possible that his current regimen  is not sufficient to cover his diabetes.  We did not obtain a hemoglobin  A1c.  He is asked to follow up with his primary care Renleigh Ouellet to make  sure he has adequate management of his diabetes.      [name], PA      Doylene Canning. Ladona Ridgel, MD  Electronically Signed    GM/MEDQ  D:  04/22/2008  T:  04/23/2008  Job:  [id number]   cc:   Doylene Canning. Ladona Ridgel, MD  Gerrit Friends. Dietrich Pates, MD, [facility]

## 2010-06-29 NOTE — Assessment & Plan Note (Signed)
[facility]                         ELECTROPHYSIOLOGY OFFICE NOTE   NAME:[name], [name]                    MRN:          [id number]  DATE:04/15/2008                            DOB:          1950-04-27    [name] returns today for followup.  He is very pleasant male with a  history of symptomatic bradycardia who underwent permanent pacemaker  insertion that was 14 years ago with a St. Jude Synchrony II placed at  that time.  He has had coronary artery disease and underwent bypass  surgery in 2003.  He has a history of peripheral vascular disease and  status post aortobifemoral back in the late 90s.  He returns today for  followup.  He had no specific complaints today except for occasional  periods of fatigue and weakness.   PHYSICAL EXAMINATION:  VITAL SIGNS:  His blood pressure was 122/70, his  pulse is 68 and regular, the respirations were 18, and the weight was  200 pounds NECK:  No jugular venous distention.  LUNGS:  Clear bilaterally to auscultation.  No wheezes, rales, or  rhonchi are present.  There is no increased work of breathing.  CARDIOVASCULAR:  Regular rate and rhythm.  Normal S1 and S2.  ABDOMEN:  Soft and nontender.  EXTREMITIES:  No edema.   MEDICATIONS:  1. Aspirin 325 daily.  2. Metformin 1 g twice daily.  3. Glipizide 10 mg twice daily.  4. Verapamil 240 mg daily.  5. Lisinopril 40 a day.  6. Plavix 75 a day.   On interrogation of his pacemaker, his battery voltage was 2.47 volts.  His underlying atrial rhythm was paced.  The R-waves were 10.  The  impedance was 404 in the A and 502 in the ventricle.  The threshold was  1.5 at 0.4 in the A and 1 at 0.4 in the RV.  He was 99% A-paced.   IMPRESSION:  1. Symptomatic bradycardia.  2. Status post pacemaker insertion.  3. Pacemaker at elective replacement indicator.  4. Peripheral vascular disease.   DISCUSSION:  I have discussed risks, benefits, goals, and  expectations  of pacemaker generator change.  We will schedule this.  There was  possible convenient time.  We will plan on holding his Plavix for rather  3 days prior to the procedure.     Doylene Canning. Ladona Ridgel, MD  Electronically Signed    GWT/MedQ  DD: 04/15/2008  DT: 04/16/2008  Job #: [id number]   cc:   Franchot Heidelberg, M.D.

## 2010-06-29 NOTE — Letter (Signed)
July 31, 2007    Franchot Heidelberg, MD  [address]  [place], [place]  [address]   RE:  TRAVEION, RUDDOCK  MRN:  [id number]  /  DOB:  1950/08/06   Dear [name]:   It was my pleasure to evaluate Mr. Pifer in the office today in  consultation at your request.  He was previously followed by Dr.  Dorethea Clan, but has not been seen by a general cardiologist within the past  2 years.  Dr. Ladona Ridgel watches out for his pacemaker, which was placed for  symptomatic bradycardia.  Review of his previous EKG show that he  generally requires only atrial pacing.  He has known coronary disease  with CABG surgery performed in 2003.  He has peripheral vascular disease  with a remote aorto-bifem graft procedure in the 1990s.  His most recent  carotid ultrasound in 2006 showed no significant obstructive disease.  He has had multiple risk factors including diabetes, hypertension,  hyperlipidemia, and tobacco abuse.  Most of these are now optimally  controlled.  Blood pressure has been good.  A recent lipid profile shows  low HDL and somewhat elevated triglycerides.  He was prescribed an  additional medication, probably fenofibrate, but has not yet started  this.  He discontinued cigarette smoking in 2007 after a 50-60 pack year  history of consumption.  Diabetic control has reportedly been good.   CURRENT MEDICATIONS:  Include aspirin 81 mg daily, metformin 500 mg  daily, glipizide 10 mg b.i.d., Lyrica 150 mg b.i.d., verapamil 180 mg  daily.   He describes a recent chest discomfort.  This was quite vague and  relatively mild.  It is substernal without radiation.  There is no  relationship to exertion, although activity has been limited by  claudication.  He has class II dyspnea on exertion unrelated to his  chest discomfort.  He has not found any mechanism for improving or  exacerbating these symptoms.  There is no associated dyspnea,  diaphoresis, nor nausea.   Review of systems, past  medical, past social, and past family history  were updated.  He has not been hospitalized in recent years.  He does  have claudication after walking short-to-moderate distance that is  relieved fairly promptly with rest.  He denies excessive use of alcohol.  He has no history of gastrointestinal problems, but has been maintained  on an H2 blocker in the past.  His last pacemaker assessment was 8  months ago.   PHYSICAL EXAMINATION:  GENERAL:  On exam, pleasant gentleman, in no  acute distress.  The weight is 207, 5 pounds more than in October 2008.  Blood pressure 130/90, heart rate 75 and regular, respirations 16.  NECK:  No jugular venous distention; no carotid bruits.  HEENT:  EOM full; normal oral mucosa.  LUNGS:  Clear.  CARDIAC:  Normal first and second heart sounds; modest basilar systolic  ejection murmur; normal PMI.  ABDOMEN:  Soft and nontender; normal bowel sounds; no masses no  organomegaly.  EXTREMITIES:  No edema; distal pulses are palpable.  NEUROLOGIC:  Symmetric strength and tone; no focal findings.   EKG:  Atrial pacing; left atrial abnormality; prior inferior myocardial  infarction; prior anterior myocardial infarction; T-wave abnormality  consistent with lateral ischemia or LVH.  When compared to previous  tracing of February 24, 2004; PVC is no longer noted; criteria for prior  anterior MI more impressive.   IMPRESSION:  Mr. Muska is doing fairly well at  the present time.  You  are doing a much better job with him on control of  his risk factors.  We will proceed with a pharmacologic stress nuclear study to evaluate a  possible progression of coronary disease.  His claudication seems out of  proportion to his peripheral vascular disease; ABIs will be obtained.  We will verify that he is starting an appropriate lipid-lowering agent,  and we will reassess this nice gentleman after a stress test has been  completed.   Thank you so much for sending him back to  [place].    Sincerely,      Gerrit Friends. Dietrich Pates, MD, [facility]  Electronically Signed    RMR/MedQ  DD: 07/31/2007  DT: 08/01/2007  Job #: [id number]

## 2010-06-29 NOTE — Assessment & Plan Note (Signed)
OFFICE VISIT   LAURIN, MORGENSTERN  DOB:  1950-12-06                                       06/26/2008  EAVWU#:[id number]   The patient returned to the office today for followup evaluation of his  carotid disease.  He underwent a carotid Doppler evaluation today.  Has  had a right carotid endarterectomy done in 2003.  Also a history of  aortobifemoral bypass in 2007 and coronary artery bypass in 2003.   His carotid Doppler evaluation reveals patent right carotid  endarterectomy site without evidence of restenosis.  60-79% left ICA  stenosis.  Unchanged from a study carried out in August of 2009.   The patient had no major complaints at this time.  Denies recent chest  pain.  No visual disturbance.  No sensory or motor deficit.   He appears generally well.  Moderately obese.  No acute distress.  BP is  130/80, pulse is 70 per minute.  No carotid bruits.  Chest is clear with  equal air entry bilaterally.  Normal heart sounds without murmurs.  No  gallops or rubs.   Overall the patient remains quite stable.  Will plan to follow up with  him in 1 year with carotid Doppler evaluation.   Balinda Quails, M.D.  Electronically Signed   PGH/MEDQ  D:  06/26/2008  T:  06/27/2008  Job:  [id number]   cc:   Gerrit Friends. Dietrich Pates, MD, [facility]

## 2010-06-29 NOTE — Letter (Signed)
August 22, 2007    Franchot Heidelberg, MD  [address], [address]  [place], [place] [address]   RE:  AUSAR, GEORGIOU  MRN:  [id number]  /  DOB:  April 03, 1950   Dear Remi Haggard:   Mr. Muzquiz returns to the office for continue assessment and treatment  of coronary artery disease and peripheral vascular disease.  Since his  last visit, he has done generally well.  He continues to have  claudication with mild exertion.  He has had no chest discomfort nor  dyspnea.  He continues to refrain from cigarette smoking.   An adenosine stress nuclear study was performed 3 weeks ago.  This  revealed normal left ventricular size, mildly impaired left ventricular  systolic function with inferior and inferoseptal hypokinesis.  There is  a small area of inferior infarction without ischemia.   ABIs were also obtained.  These revealed fairly severe disease with  values of 0.56 on the right and 0.53 on the left.  The wave forms were  apparently most consistent with obstruction at below the level of  Hunter's canal.   Since his last visit, he was started fenofibrate 145 mg daily.  Diovan  was changed to lisinopril and a low dose of diuretic added.   PHYSICAL EXAMINATION:  VITAL SIGNS:  The weight is 208, stable.  Blood  pressure 145/70, heart rate 75 and regular, and respirations 13.  NECK:  No jugular venous distention; normal carotid upstrokes without  bruits.  LUNGS:  Clear.  CARDIAC:  Normal first and second heart sounds; slight basilar systolic  ejection murmur.  ABDOMEN:  Soft and nontender; no masses; no organomegaly.  EXTREMITIES:  Right dorsalis pedis pulses fairly good; otherwise, distal  pulses are decreased.   IMPRESSION:  Mr. Suydam is doing well from a cardiac standpoint and  with respect to control of cardiovascular risk factors.  He would  benefit from statin treatment and we will start simvastatin 20 mg daily.  He is unclear as to when he was previously seen by  [name].  We will  arrange for continuing followup, at least on an annual basis.  I will  plan to see this nice gentleman again in 8 months.    Sincerely,      Gerrit Friends. Dietrich Pates, MD, [facility]  Electronically Signed    RMR/MedQ  DD: 08/22/2007  DT: 08/23/2007  Job #: [id number]   CC:    Balinda Quails, M.D.

## 2010-06-29 NOTE — Op Note (Signed)
NAME:  LETROY, [name] NO.:  [id number]   MEDICAL RECORD NO.:  [id number]          PATIENT TYPE:  AMB   LOCATION:  DAY                           FACILITY:  [facility]   PHYSICIAN:  R. Roetta Sessions, M.D. DATE OF BIRTH:  1950-09-10   DATE OF PROCEDURE:  02/20/2007  DATE OF DISCHARGE:                                PROCEDURE NOTE   PROCEDURE:  Screening ileocolonoscopy.   INDICATIONS FOR PROCEDURE:  A 56-year African American gentleman sent  over courtesy of Dr. Erby Pian, for colorectal cancer screening.  He is  devoid of any lower GI tract symptoms.  He has never had his lower GI  tract imaged and there is no family history of colorectal neoplasia.  Colonoscopy is now being done as a screening maneuver.  This approach  has been discussed with the patient along with the potential risks,  benefits, alternatives and limitations.  The patient questions were  answered.  He is agreeable.  Please see documentation in the medical  record.   PROCEDURE NOTE:  O2 saturation, blood pressure, pulse monitored  throughout the entire procedure.  Conscious sedation: Versed 3 mg IV,  Demerol 75 mg IV, in divided doses.  Instrument:  Pentax video chip  system.   FINDINGS:  Digital rectal exam revealed no abnormalities   ENDOSCOPIC FINDINGS:  Prep was good.  Colon:  Colonic mucosa was surveyed from the rectosigmoid junction  through the left transverse right colon to the appendiceal orifice,  ileocecal valve and cecum.  These structures were well seen and  photographed for the record.  Terminal ileum was intubated 10 cm.  From  this level scope  was slowly and cautiously withdrawn.  All previously  mentioned mucosal surfaces were again seen.  The colonic mucosa as well  as the terminal ileum mucosa appeared normal.  The scope was pulled down  to the rectum.  Thorough examination of the rectal mucosa including  retroflexed view of the anal verge demonstrated no abnormalities.  The  patient tolerated the procedure well and was reactive in endoscopy.   IMPRESSION:  Normal rectum, colon, and terminal ileum.   RECOMMENDATIONS:  Repeat screening colonoscopy, 10 years.      Jonathon Bellows, M.D.  Electronically Signed    RMR/MEDQ  D:  02/20/2007  T:  02/20/2007  Job:  [id number]   cc:   Franchot Heidelberg, M.D.

## 2010-06-29 NOTE — Letter (Signed)
August 22, 2007     RE:  KAYCEON, OKI  MRN:  [id number]  /  DOB:  1951/02/12   ADDENDUM.   PHYSICAL EXAMINATION:  VITAL SIGNS:  The weight is 208, stable.  Blood  pressure 145/70, heart rate 75 and regular, and respirations 13.  NECK:  No jugular venous distention; normal carotid upstrokes without  bruits.  LUNGS:  Clear.  CARDIAC:  Normal first and second heart sounds; slight basilar systolic  ejection murmur.  ABDOMEN:  Soft and nontender; no masses; no organomegaly.  EXTREMITIES:  Right dorsalis pedis pulses fairly good; otherwise, distal  pulses are decreased.   IMPRESSION:  Mr. Hardgrove is doing well from a cardiac standpoint and  with respect to control of cardiovascular risk factors.  He would  benefit from statin treatment and we will start simvastatin 20 mg daily.  He is unclear as to when he was previously seen by [name].  We will  arrange for continuing followup, at least on an annual basis.  I will  plan to see this nice gentleman again in 8 months.    Sincerely,      Gerrit Friends. Dietrich Pates, MD, [facility]    RMR/MedQ  DD: 08/22/2007  DT: 08/23/2007  Job #: [id number]

## 2010-06-29 NOTE — Procedures (Signed)
CAROTID DUPLEX EXAM   INDICATION:  Follow up carotid artery disease.   HISTORY:  Diabetes:  Yes.  Cardiac:  MI.  Hypertension:  Yes.  Smoking:  Quit.  Previous Surgery:  Right CEA.  CV History:  No.  Amaurosis Fugax No, Paresthesias No, Hemiparesis No.                                       RIGHT             LEFT  Brachial systolic pressure:         160               155  Brachial Doppler waveforms:         Biphasic          Biphasic  Vertebral direction of flow:        Antegrade         Antegrade (barely  visualized)  DUPLEX VELOCITIES (cm/sec)  CCA peak systolic                   67                80  ECA peak systolic                   55                227  ICA peak systolic                   60                217  ICA end diastolic                   23                91  PLAQUE MORPHOLOGY:                  N/A in ICA        Mixed/calcified  PLAQUE AMOUNT:                      N/A in ICA        Moderate/severe  PLAQUE LOCATION:                    CCA               ICA/ECA/CCA   IMPRESSION:  1. Right internal carotid artery shows no evidence of restenosis,      status post carotid endarterectomy.  2. Left internal carotid artery shows evidence of 60-79% stenosis with      the highest velocity in the mid internal carotid artery.  3. Left external carotid artery stenosis.  4. Bilateral high bifurcations.   ___________________________________________  P. Liliane Bade, M.D.   AS/MEDQ  D:  10/11/2007  T:  10/11/2007  Job:  [id number]

## 2010-06-29 NOTE — Assessment & Plan Note (Signed)
OFFICE VISIT   [name], [name]  DOB:  12-02-1950                                       10/11/2007  KGMWN#:[id number]   The patient has an extensive history of atherosclerotic vascular  disease.  Most recent operation was aortobifemoral bypass carried out in  May of 2007 for ischemic rest pain of the right foot.  He has undergone  bilateral femoral popliteal bypasses, coronary artery bypass and a right  carotid endarterectomy in the past.   He is referred to the office at this time for followup evaluation of his  carotid disease.  A carotid Doppler was performed today and this reveals  his right carotid endarterectomy site to be widely patent without  evidence of restenosis.  His left ICA reveals a 60-79% stenosis and the  velocity is 217/91 cm/sec.  He has a moderate amount of mixed calcific  plaque in the left carotid bifurcation.   PAST MEDICAL HISTORY:  His past medical history further includes  diabetes, hypertension, coronary artery disease, congestive heart  failure and hyperlipidemia.   SOCIAL HISTORY:  He is married with two children.  He is on long-term  disability.  Does not smoke.  Discontinued tobacco use in 2007.  No  alcohol use.   His main complaints relate to weight gain.  He has occasional chest  tightness and shortness of breath with exertion.  He does have some leg  pain with ambulation.   PHYSICAL EXAMINATION:  General:  Evaluation reveals a moderately obese  60 year old male.  Alert and oriented.  Neck:  No carotid bruits.  Cranial nerves intact.  Strength equal bilaterally.  Normal reflexes.  Heart:  Sounds are normal without murmurs.  Chest:  Is clear with equal  entry bilaterally.  Abdomen:  Obese, soft, nontender.  No masses or  organomegaly.   The patient does have moderate asymptomatic left carotid occlusive  disease.  His peripheral vascular disease is stable.   We will plan followup again in 6 months with a carotid  Doppler  evaluation.   CURRENT MEDICATIONS:  1. Were reviewed and include aspirin 81 mg daily.  2. Lisinopril 40 mg daily.  3. Tricor 145 mg daily.  4. Verapamil CR 240 mg daily.  5. Metformin 1000 mg b.i.d.  6. Lyrica 150 mg b.i.d.  7. Glipizide XL 10 mg b.i.d.  8. Plavix 75 mg daily.  9. Hydrochlorothiazide 25 mg daily.  10.Ultram 50 mg q.6 h p.r.n.   Balinda Quails, M.D.  Electronically Signed   PGH/MEDQ  D:  10/11/2007  T:  10/12/2007  Job:  [id number]   cc:   Gerrit Friends. Dietrich Pates, MD, [facility]

## 2010-06-29 NOTE — Op Note (Signed)
NAME:  [name], [name] NO.:  [id number]   MEDICAL RECORD NO.:  [id number]          PATIENT TYPE:  OIB   LOCATION:  2899                         FACILITY:  [facility]   PHYSICIAN:  Doylene Canning. Ladona Ridgel, MD    DATE OF BIRTH:  05-29-1950   DATE OF PROCEDURE:  04/22/2008  DATE OF DISCHARGE:  04/22/2008                               OPERATIVE REPORT   PROCEDURE PERFORMED:  Removal of previously implanted dual-chamber  pacemaker and insertion of a new dual-chamber pacemaker with pacemaker  lead testing.   The patient is a 60 year old male with a history of symptomatic  bradycardia with no permanent pacemaker insertion in 1996.  The  patient's pacemaker was subsequently reached ERI and is now referred for  pacemaker generator change with insertion of the new device.   PROCEDURE:  After informed was obtained, the patient was taken to the  diagnostic EP lab in fasting state.  After the usual preparation and  draping, intravenous fentanyl and midazolam was given for sedation.  A  30 mL lidocaine was infiltrated into the right infraclavicular region.  A 5-cm incision was carried out over this region.  Electrocautery was  utilized and dissect down to the pacemaker pocket.  Additional  electrocautery was utilized to open the pocket and free up the pacemaker  generator along with the pacing leads.  The RV lead was 1346T, 52-cm  lead with R waves measuring 12 mV with pacing impedance of 490 ohms and  a threshold less of than a V at 0.5 msec.  The atrial lead was a model  11ADAT, which had P-waves of 2.5 mV and pacing threshold of 1.25 V at  0.5 msec where the impedance of 311 ohms.  With these satisfactory  findings, the new St. Jude Accent DR dual-chamber device, serial number  [id number] was connected to the old atrial and ventricular leads and placed  back in the subcutaneous pocket.  The pocket was irrigated with  kanamycin and the pocket was closed with 2-0 Vicryl and 3-0 Vicryl.  Benzoin was painted on the skin.  Steri-Strips were applied and the  pressure dressing was placed and the patient was returned to his room in  satisfactory condition.   COMPLICATIONS:  There were no immediate procedure complications.   RESULTS:  This demonstrated successful removal of previous implanted St.  Jude pacemaker followed by insertion of a new device without immediate  procedure complications.      Doylene Canning. Ladona Ridgel, MD  Electronically Signed     GWT/MEDQ  D:  04/22/2008  T:  04/23/2008  Job:  [id number]   cc:   Franchot Heidelberg, M.D.

## 2010-06-29 NOTE — Procedures (Signed)
CAROTID DUPLEX EXAM   INDICATION:  Carotid disease.   HISTORY:  Diabetes:  Yes.  Cardiac:  MI.  Hypertension:  Yes.  Smoking:  Previous.  Previous Surgery:  Right carotid endarterectomy in 2003.  CV History:  Currently asymptomatic.  Amaurosis Fugax No, Paresthesias No, Hemiparesis No.                                       RIGHT             LEFT  Brachial systolic pressure:         152               138  Brachial Doppler waveforms:         Normal            Normal  Vertebral direction of flow:        Antegrade         Not visualized  DUPLEX VELOCITIES (cm/sec)  CCA peak systolic                   111               115  ECA peak systolic                   119               281  ICA peak systolic                   42                203  ICA end diastolic                   15                76  PLAQUE MORPHOLOGY:                  Mixed             Mixed  PLAQUE AMOUNT:                      Mild              Moderate/severe  PLAQUE LOCATION:                    CCA               ICA/ECA/CCA   IMPRESSION:  1. Patent right carotid endarterectomy site with no evidence of      stenosis in the right internal carotid artery.  2. A 60% to 79% stenosis of the left internal carotid artery.  3. No significant change noted when compared to the previous exam on      10/11/2007.   ___________________________________________  P. Liliane Bade, M.D.   CH/MEDQ  D:  06/26/2008  T:  06/26/2008  Job:  [id number]

## 2010-06-29 NOTE — Assessment & Plan Note (Signed)
[facility]                         ELECTROPHYSIOLOGY OFFICE NOTE   NAME:[name], Collin MAFFETT                    MRN:          [id number]  DATE:12/06/2006                            DOB:          [birth year]    The patient returns today for follow-up.  He is a very pleasant man with  a history of symptomatic bradycardia status post pacemaker insertion  back in 1996.  He has a Lawyer II device placed in and  returns today for follow-up.  He has had longstanding atrial under-  sensing.  The patient had no specific complaints today.  He has gone  back to working part-time.  He denies chest pain, he denies shortness of  breath.  He does have a very mild pain in his legs at nighttime which is  thought secondary to diabetic neuropathy and also complains of some  claudication symptoms which are very mild.  Otherwise, he had no other  complaints.   MEDICATIONS:  1. Diovan/hydrochlorothiazide 320/25 mg.  2. Plavix 75 mg a day.  3. Verapamil 180 mg a day.  4. Lyrica.  5. Glipizide 10 mg b.i.d.  6. Metformin 500 mg daily.  7. Aspirin 81 mg a day.   PHYSICAL EXAMINATION:  GENERAL:  He is a pleasant, well-appearing man in  no acute distress.  VITAL SIGNS:  Blood pressure today was 124/74, pulse 75 and regular,  respirations 18, and weight was 202 pounds.  NEUROLOGY:  No jugular venous distention.  LUNGS:  Clear to auscultation bilaterally.  There were no wheezes,  rales, or rhonchi.  CARDIOVASCULAR:  Regular rate and rhythm with normal S1 and S2.  EXTREMITIES:  No edema.   Interrogation of his pacemaker demonstrates a Lawyer II.  The battery voltage was 2.69 volts.  The impedance was 512 in the atrium  and 524 in the ventricle.  Threshold was 1.5 at 0.4 in the atrium and 1  at 0.4 in the ventricle.  There were no P waves, the R waves were  greater than 10.  Battery voltage was 2.69 volts.   IMPRESSION:  1. Symptomatic  bradycardia.  2. Status post pacemaker insertion.  3. Hypertension.   DISCUSSION:  The patient is stable and his pacemaker is working  normally.  He will come back to the clinic in six months.  If his  symptoms of claudication worsen, we have asked that he give [place] a call.     Doylene Canning. Ladona Ridgel, MD  Electronically Signed    GWT/MedQ  DD: 12/06/2006  DT: 12/07/2006  Job #: [id number]   cc:   Ramon Dredge L. Juanetta Gosling, M.D.

## 2010-07-02 NOTE — H&P (Signed)
NAME:  [name], POKE NO.:  [id number]   MEDICAL RECORD NO.:  [id number]          PATIENT TYPE:  OIB   LOCATION:  5707                         FACILITY:  [facility]   PHYSICIAN:  Balinda Quails, M.D.    DATE OF BIRTH:  10/09/1950   DATE OF ADMISSION:  07/05/2005  DATE OF DISCHARGE:  07/05/2005                                HISTORY & PHYSICAL   CHIEF COMPLAINT:  Bilateral lower extremity claudication.   HISTORY OF PRESENT ILLNESS:  The patient is a 60 year old African-American  male with past medical history of hypertension, hyperlipidemia, coronary  artery disease status post coronary bypass grafting.  The patient was  recently seen in the office by [name] June 13, 2005.  The patient was  seen with complaints of bilateral lower extremity claudication, the right  lower extremity greater than the left lower extremity.  The patient states  this has been occurring for several weeks now with pain in the right foot  radiating up to the right hip.  It occurs with rest as well as ambulation.  He has associated numbness and tingling in the right lower extremity.  Dr.  Madilyn Fireman performed an abdominal aortogram with pelvic runoff as well as  bilateral selective lower extremity arteriograms on Jun 24, 2005.  This  showed calcified iliac arteries bilaterally, high-grade right mid-external  iliac artery stenosis and moderate left external iliac artery stenosis.  His  right lower extremity reveals occlusion in the superficial femoral artery,  popliteal artery with reconstitution in the peroneal/posterior tibial artery  via collaterals.  Left leg reveals occlusion in the left superficial femoral  and popliteal artery, reconstitution of three tibial arteries via lateral.  The patient was discharged home following the aortogram.  The patient  presents today to [facility] to have another aortogram done on Jul 05, 2005.  This was performed by Dr. Darrick Penna.  This showed right common iliac  artery  occlusion at the origin.  There is 50% stenosis of the left external iliac  artery.  The patient tolerated this procedure well without difficulty.  After further discussion with the patient, he denies any ulcerations fevers,  chills, erythema or drainage of his lower extremities.  He has noted  decreased temperature, right greater than left.  Does state that he had to  present to the emergency room several times with the above complaints.   PAST MEDICAL HISTORY:  1.  History of coronary artery disease, status post coronary bypass      grafting.  2.  Hypertension.  3.  History of peripheral vascular disease.  4.  Sleep apnea.  5.  Hyperlipidemia.  6.  Newly-diagnosed diabetes mellitus.   PAST SURGICAL HISTORY:  1.  Coronary artery bypass grafting x4.  2.  Status post __________ .  3.  Status post tonsillectomy/adenoidectomy.  4.  Status post right common carotid endarterectomy.   ALLERGIES:  No known drug allergies.   MEDICATIONS:  1.  Verapamil 240 mg b.i.d.  2.  Tricor 145 mg daily.  3.  Aspirin 81 mg daily.  4.  Cilostazol 100 mg b.i.d.  5.  Morphine __________ p.o. p.r.n.  6.  Darvocet-N 100 two tablets q.4h.   SOCIAL HISTORY:  The patient is currently married with five children, lives  at home with his family.  Has a history of tobacco abuse.  He quit about one  month ago.  Denies any alcohol.  The patient is retired, residing in  [place].   FAMILY HISTORY:  Mother is still living at 55 with history of Alzheimer's.  Father deceased at age 68 with congestive heart failure.   REVIEW OF SYSTEMS:  See HPI for pertinent positives and negatives.  The  patient denies any recent changes in weight, fevers, night sweats or chills.  He denies any headaches, recent changes in vision or hearing, difficulty  swallowing.  Denies any chest pain, palpitations, lightheadedness,  dizziness, orthopnea, paroxysmal nocturnal dyspnea.  Denies any shortness of  breath,  hemoptysis, coughing or wheezing.  Denies any nausea, vomiting,  abdominal pain or changes in bowel movements.  The patient has not had any  hematochezia.  Denies any urgency, frequency, dysuria or hematuria.  The  patient also denies any TIA or CVA, amaurosis fugax or   PHYSICAL EXAMINATION:  GENERAL:  Well-developed, well-nourished white male  in no acute distress.  VITAL SIGNS:  Blood pressure 159/82, pulse 97, respirations 80s.  HEENT: Normocephalic, atraumatic.  Pupils equal, round and react to light  and accommodation.  Extraocular movements intact.  Oral mucosa is pink and  moist.  The patient does have dentures fourth, upper and lower, currently  not in place.  NECK:  Supple.  There is a well-healed scar from his right carotid  endarterectomy.  No carotid bruit is noted.  RESPIRATORY:  Clear to auscultation bilaterally.  CARDIAC:  Regular rate and rhythm with a 2/6 systolic ejection murmur noted.  There is a well-healed scar from his status post bypass surgery.  ABDOMEN:  Bowel sounds x4.  Soft, nontender on palpation.  No  hepatosplenomegaly noted.  GENITOURINARY:  Deferred.  RECTAL:  Deferred.  EXTREMITIES:  No edema, cyanosis or clubbing noted.  The patient's lower  extremities are cold to touch bilaterally.  He has 2+ bilateral radial  pulses noted.  No palpable femoral, dorsalis pedis or posterior tibial pulse  is noted.  There is no gangrene, clubbing or mottling noted.  NEUROLOGIC:  Cranial nerves II-XII intact.  Patient is alert and oriented  x4.  Gait is steady.  Muscle strength 5/5 upper and lower extremities  bilaterally.   IMPRESSION AND PLAN:  [name] is a 60 year old male seen with peripheral  vascular occlusive disease.  The patient will be admitted to [facility].  [name] to evaluate the patient for possible revascularization in the  morning.  Will restart the patient's medications.  Please see post peripheral vascular catheterization orders.  Will  consult cardiology as well  as medical service for new-onset diabetes mellitus.      Stephanie Acre Dominick, PA      P. Liliane Bade, M.D.  Electronically Signed    KMD/MEDQ  D:  07/05/2005  T:  07/05/2005  Job:  [id number]

## 2010-07-02 NOTE — Cardiovascular Report (Signed)
NAME:  PARKS, CZAJKOWSKI NO.:  [id number]   MEDICAL RECORD NO.:  [id number]                   PATIENT TYPE:  INP   LOCATION:  2308                                 FACILITY:  [facility]   PHYSICIAN:  Rollene Rotunda, M.D. [facility]            DATE OF BIRTH:  [birth year]   DATE OF PROCEDURE:  DATE OF DISCHARGE:                              CARDIAC CATHETERIZATION   PROCEDURE:  Left heart catheterization/coronary arteriography.   INDICATION:  Evaluate patient with a non-Q-wave myocardial infarction.   PROCEDURAL NOTE:  Left heart catheterization was performed via the right  femoral artery.  I used a high approach, given his bilateral aortobifem  bypass grafts.  The vessel was cannulated using a Smart needle without  difficulty.  Preformed Judkins and a pigtail catheter were utilized.  The  patient tolerated the procedure well and left the lab in stable condition.   RESULTS:  Hemodynamics:  LV 127/23, Ao 125/95.   Coronaries:  The left main was normal.  The LAD had an ostial and proximal  80% stenosis in a heavily calcified area.  This compromised a large septal  perforator.  The entire midsegment of the LAD was calcified with diffuse 25%  and 35% lesions.  In the mid-to-distal segment, there was a 40% stenosis.  Apical vessel was relatively free of disease.  There were two somewhat small  diagonals emerging from the midvessel.  The circumflex was a very large  vessel.  In the A-V groove, there was long proximal and mid 30% stenosis  before two mid obtuse marginals.  After the mid obtuse marginal, there was a  70% stenosis that compromised a moderate-sized posterolateral vessel.  There  was small ramus intermediate.  There were two large mid obtuse marginals  originating immediately after the 95% A-V groove stenosis.   Right coronary:  The right coronary artery was a dominant vessel.  There was  a long proximal 80% stenosis.  There was 99% stenosis immediately after  the  right ventricular branch.  There was a long mid 30% stenosis.  There was a  focal 60% stenosis before the PDA.  The PDA was moderately diffusely  diseased with 40-50% lesions.   Left ventriculogram:  The left ventriculogram was obtained in the RAO  projection.  The EF was 65% with apparently normal wall motion (the images  were somewhat compromised by ventricular ectopy).   CONCLUSION:  Well-preserved ejection fraction.  Severe three-vessel coronary  disease.   PLAN:  The patient will be referred for CABG.                                               Rollene Rotunda, M.D. [facility]    JH/MEDQ  D:  09/17/2001  T:  09/22/2001  Job:  [id number]   cc:  Dirk Dress. Katrinka Blazing, M.D.

## 2010-07-02 NOTE — Consult Note (Signed)
NAME:  [name], CORALLO NO.:  [id number]   MEDICAL RECORD NO.:  [id number]           PATIENT TYPE:   LOCATION:                                 FACILITY:   PHYSICIAN:  Charlies Constable, M.D. [facility]      DATE OF BIRTH:   DATE OF CONSULTATION:  07/05/2005  DATE OF DISCHARGE:                                   CONSULTATION   PRIMARY CARE PHYSICIAN:  Edward L. Juanetta Gosling, M.D.   PRIMARY CARDIOLOGIST:  Vida Roller, M.D.   PATIENT PROFILE:  A 60 year old African American male with a prior history  of CAD, PVD, who requires preoperative clearance prior to lower extremity  bypass surgery.   PROBLEMS:  1.  CAD.      1.  __________.      2.  September 20, 2001, CABG times four with LIMA to the LAD, vein graft to          the OM-1 and OM-2, vein graft to PDA.  2.  Hypertension.  3.  Hyperlipidemia.  4.  Hypertriglyceridemia.  5.  Peripheral vascular disease.      1.  Status post bilateral fem-pop bypass in 1998.      2.  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      3.  On Jun 24, 2005, __________ showed bilateral external iliac          stenosis. 100% right SFA __________ bilateral 100% __________ .  6.  COPD.  7.  Tobacco abuse.  8.  Sick sinus syndrome.      1.  On Jun 22, 1994, status post [place]. Jude model [id number], dual-chamber          permanent pacemaker.   HISTORY OF PRESENT ILLNESS:  A 60 year old [place] American male with a  history of CAD, PVD, and CVD as outlined below. He has had progressive  bilateral right greater than left lower extremity claudication for at least  the past year, which has worsened significantly over the past month with  pain now occurring at rest on the right. Over this period of this time he  denied any chest pain or shortness of breath, but has had significant  decrease exercise tolerance secondary to claudication. He has been seen by  Dr. __________ with peripheral runoff on May 11th and additional imaging  today. The patient also needs  bilateral lower extremity bypass surgery.   ALLERGIES:  No known drug allergies.   CURRENT MEDICATIONS:  1.  Aspirin 325 mg daily.  2.  Verapamil 240 mg b.i.d.  3.  K-Dur 20 mEq daily.  4.  Diovan/hydrochlorothiazide 160/12.5 mg daily.  5.  Tricor 145 mg daily.  6.  Pletal 100 mg b.i.d.  7.  Paxil 20 mg daily.   FAMILY HISTORY:  Mother is age 68 with Alzheimer's. Father died of black  lung and problems with CAD at age 32. A brother has CAD.   SOCIAL HISTORY:  Lives in [place] with his wife. He is currently on  disability. He has five grown children. He smokes 40 pack years, quitting  about one  month ago. He denies any alcohol, drugs, or exercise.   REVIEW OF SYSTEMS:  Positive for chills that occurred this morning as well  as cold sweat. He also has right greater than left claudication as described  in HPI. He does have some mild genitourinary symptoms with straining on  urination. He denies any chest pain, palpitations, shortness of breath, PND,  orthopnea, dizziness, syncope, or edema. All other systems reviewed and are  negative.   PHYSICAL EXAMINATION:  VITAL SIGNS: Blood pressure 128/80, heart rate 72,  respirations 16, afebrile.  GENERAL: A pleasant African American male in no acute distress, awake,  alert, and oriented times three.  NECK: No JVD and a loud bruit on the left.  LUNGS: Respirations are regular and labored, clear to auscultation.  CARDIAC: Regular S1 and S2. No S3, S4, or murmurs. Distal pulses difficult  to palpate.   ACCESSORY/CLINICAL FINDINGS:  Hemoglobin 11.9, hematocrit 36.6, WBC 5.6,  platelet count 455,000. Sodium 135, potassium 4.3, chloride 103, CO2 of 26,  BUN 9, creatinine 1.9, glucose 201. Total bilirubin 0.4. alkaline  phosphatase 56, AST 21, ALT 18, total protein 6.9, albumin 3.7.   ASSESSMENT/PLAN:  1.  Peripheral vascular disease. Patient is pending lower extremity bypass      surgery after __________. He does require cardiac  clearance given his      history of __________ .  2.  Coronary artery disease. The patient denies chest pain or shortness of      breath over the past year, but has had significant decline in functional      capacity secondary to claudication. This in combination with pending      vascular surgery, no recent cardiac evaluation, and previous myocardial      infarction, means that he should be evaluated noninvasively prior to      surgery. Discussed this with [name] and will go ahead and schedule      him for an adenosine Myoview in the a.m. to rule out ischemia. This      could also be done on an outpatient basis if felt appropriate by Dr.      Madilyn Fireman. Would plan to add beta blocker perioperatively provided that this      does not cause wheezing in the setting of history of tobacco abuse and      chronic obstructive pulmonary disease.  3.  Hypertension, stable. Continue angiotensin-receptor blockers and      hydrochlorothiazide. He is also on verapamil.  4.  Hyperlipidemia/hypertriglyceridemia. Recently started on Tricor. His      LFTs looked okay. Check fasting lipids.  5.  Chronic obstructive pulmonary disease. No wheezing.  6.  Tobacco abuse. He quit one month ago. Continued cessation advised.      [name], NP    ______________________________  Charlies Constable, M.D. [facility]    CRB/MEDQ  D:  07/05/2005  T:  07/05/2005  Job:  [id number]

## 2010-07-02 NOTE — Procedures (Signed)
NAME:  JOANNA, HALL NO.:  [id number]   MEDICAL RECORD NO.:  [id number]          PATIENT TYPE:  EMS   LOCATION:  ED                            FACILITY:  [facility]   PHYSICIAN:  Edward L. Juanetta Gosling, M.D.DATE OF BIRTH:  04/12/1950   DATE OF PROCEDURE:  04/12/2004  DATE OF DISCHARGE:  04/13/2004                                EKG INTERPRETATION   PROCEDURE:  EKG   TIME:  2146.   INTERPRETATION:  The rhythm is what appears to be a paced rhythm.  The rest  of the interpretation by the computer is incorrect.      ELH/MEDQ  D:  04/13/2004  T:  04/13/2004  Job:  [id number]

## 2010-07-04 ENCOUNTER — Emergency Department (HOSPITAL_COMMUNITY)
Admission: EM | Admit: 2010-07-04 | Discharge: 2010-07-04 | Disposition: A | Discharge status: Home / Self Care | Payer: Self-pay | Attending: Emergency Medicine | Admitting: Emergency Medicine

## 2010-07-04 DIAGNOSIS — Z79899 Other long term (current) drug therapy: Secondary | ICD-10-CM | POA: Insufficient documentation

## 2010-07-04 DIAGNOSIS — M79609 Pain in unspecified limb: Secondary | ICD-10-CM | POA: Insufficient documentation

## 2010-07-04 DIAGNOSIS — E119 Type 2 diabetes mellitus without complications: Secondary | ICD-10-CM | POA: Insufficient documentation

## 2010-07-04 DIAGNOSIS — Z951 Presence of aortocoronary bypass graft: Secondary | ICD-10-CM | POA: Insufficient documentation

## 2010-07-04 DIAGNOSIS — I10 Essential (primary) hypertension: Secondary | ICD-10-CM | POA: Insufficient documentation

## 2010-07-04 DIAGNOSIS — Z7982 Long term (current) use of aspirin: Secondary | ICD-10-CM | POA: Insufficient documentation

## 2010-07-04 DIAGNOSIS — Z950 Presence of cardiac pacemaker: Secondary | ICD-10-CM | POA: Insufficient documentation

## 2010-07-04 DIAGNOSIS — R209 Unspecified disturbances of skin sensation: Secondary | ICD-10-CM | POA: Insufficient documentation

## 2010-11-09 LAB — URINALYSIS, ROUTINE W REFLEX MICROSCOPIC
Bilirubin Urine: NEGATIVE
Glucose, UA: NEGATIVE
Hgb urine dipstick: NEGATIVE
Ketones, ur: NEGATIVE
Protein, ur: NEGATIVE
Urobilinogen, UA: 0.2

## 2011-02-14 ENCOUNTER — Encounter: Payer: Self-pay | Admitting: Cardiology

## 2011-04-29 ENCOUNTER — Ambulatory Visit: Payer: Self-pay | Admitting: Family Medicine

## 2011-05-20 ENCOUNTER — Ambulatory Visit (INDEPENDENT_AMBULATORY_CARE_PROVIDER_SITE_OTHER): Payer: Medicare Other | Admitting: Family Medicine

## 2011-05-20 ENCOUNTER — Encounter: Payer: Self-pay | Admitting: Family Medicine

## 2011-05-20 VITALS — BP 180/90 | HR 81 | Resp 16 | Ht 65.25 in | Wt 193.0 lb

## 2011-05-20 DIAGNOSIS — E1159 Type 2 diabetes mellitus with other circulatory complications: Secondary | ICD-10-CM

## 2011-05-20 DIAGNOSIS — R079 Chest pain, unspecified: Secondary | ICD-10-CM

## 2011-05-20 DIAGNOSIS — G609 Hereditary and idiopathic neuropathy, unspecified: Secondary | ICD-10-CM

## 2011-05-20 DIAGNOSIS — E785 Hyperlipidemia, unspecified: Secondary | ICD-10-CM

## 2011-05-20 DIAGNOSIS — I10 Essential (primary) hypertension: Secondary | ICD-10-CM

## 2011-05-20 DIAGNOSIS — I798 Other disorders of arteries, arterioles and capillaries in diseases classified elsewhere: Secondary | ICD-10-CM

## 2011-05-20 LAB — COMPREHENSIVE METABOLIC PANEL
AST: 14 U/L (ref 0–37)
Albumin: 3.9 g/dL (ref 3.5–5.2)
BUN: 16 mg/dL (ref 6–23)
CO2: 27 mEq/L (ref 19–32)
Calcium: 9.7 mg/dL (ref 8.4–10.5)
Chloride: 103 mEq/L (ref 96–112)
Creat: 1.04 mg/dL (ref 0.50–1.35)
Glucose, Bld: 237 mg/dL — ABNORMAL HIGH (ref 70–99)
Potassium: 4 mEq/L (ref 3.5–5.3)

## 2011-05-20 LAB — LDL CHOLESTEROL, DIRECT: Direct LDL: 109 mg/dL — ABNORMAL HIGH

## 2011-05-20 MED ORDER — LISINOPRIL-HYDROCHLOROTHIAZIDE 20-12.5 MG PO TABS: 1.0000 | ORAL_TABLET | Freq: Every day | ORAL | Status: DC

## 2011-05-20 NOTE — Progress Notes (Signed)
  Subjective:    Patient ID: [name], male    DOB: 10-Apr-1950, 61 y.o.   MRN: [id number]  HPI Pt here to establish care, previous PCP Dr. Felecia Shelling [facility]- Dr. Ladona Ridgel ([place]) Medications and history reviewed- pt has old medication bottles and some he is still taking, has other bottles at home,  DM- diagnosed in 1998, he has a meter today, however we can not get it to give [place] any values. He has been on glipizide and metformin, last A1C unknown. Also admits to tingling and numbness in his feet for many years, has PVD  CAD- s/p MI in 1996, had pacer placed secondary to bradycardia, has not followed with cardiology but has CP on and off, no NTG used, he takes aspirin when he gets pain, no longer on plavix  HTN- out of meds x 4 days    Review of Systems   GEN- denies fatigue, fever, weight loss,weakness,+ recent illness (URI) HEENT- denies eye drainage, change in vision, nasal discharge, CVS- +chest pain, palpitations, +leg swelling RESP- denies SOB, cough, wheeze ABD- denies N/V, change in stools, abd pain GU- denies dysuria, hematuria, dribbling, incontinence MSK- denies joint pain,+ muscle aches, injury Neuro- denies headache, dizziness, syncope, seizure activity      Objective:   Physical Exam GEN- NAD, alert and oriented x3, pleasant HEENT- PERRL, EOMI, non injected sclera, pink conjunctiva, MMM, oropharynx clear Neck- Supple, +carotid bruit CVS- RRR, no murmur RESP-CTAB ABD- NABS, soft, NT,ND EXT- No edema Pulses- Radial, DP- 2+,  Feet- long toenails, thick, no open lesions, callus build-up  EKG- Electronically paced, no ST changed, inverted t waves in Lateral leads, compared to EKG 2009, no big changes        Assessment & Plan:

## 2011-05-20 NOTE — Patient Instructions (Signed)
Get the blood work done today We will review next week Continue taking the metformin, glipizide, aspirin and neurontin I have refilled your blood pressure pill lisinopril/HCTZ- take 1 tablet a day F/U on Tuesday morning next week

## 2011-05-21 LAB — CBC
Hemoglobin: 12 g/dL — ABNORMAL LOW (ref 13.0–17.0)
MCHC: 32.3 g/dL (ref 30.0–36.0)
RBC: 4.38 MIL/uL (ref 4.22–5.81)
WBC: 5.9 10*3/uL (ref 4.0–10.5)

## 2011-05-22 ENCOUNTER — Encounter: Payer: Self-pay | Admitting: Family Medicine

## 2011-05-22 NOTE — Assessment & Plan Note (Signed)
He has CP with exertion and at rest, he down plays how often he gets pain but I am concerned for anginal episodes, I will restart his BP meds and assess his other co-morbidites with labs today, he will follow up next week, he wants to see his previous cardiologist and declines a new heart doctor, but states he has a bill with the previous. No signs of acute MI today.

## 2011-05-22 NOTE — Assessment & Plan Note (Signed)
BS elevated today, will obtain labs and see him with a quick follow-up next Tuesday, to change medications

## 2011-05-22 NOTE — Assessment & Plan Note (Signed)
Currently no statin therapy, check lipids

## 2011-05-22 NOTE — Assessment & Plan Note (Signed)
On neurontin but he is having difficulty affording medication

## 2011-05-22 NOTE — Assessment & Plan Note (Signed)
Refilled ACE/HCTZ, no Beta blocker with pacer/bradycardia

## 2011-05-24 ENCOUNTER — Ambulatory Visit: Payer: Medicare Other | Admitting: Family Medicine

## 2011-05-24 ENCOUNTER — Telehealth: Payer: Self-pay | Admitting: Family Medicine

## 2011-05-24 ENCOUNTER — Encounter: Payer: Self-pay | Admitting: Family Medicine

## 2011-05-24 ENCOUNTER — Ambulatory Visit (INDEPENDENT_AMBULATORY_CARE_PROVIDER_SITE_OTHER): Payer: Medicare Other | Admitting: Family Medicine

## 2011-05-24 VITALS — BP 158/80 | HR 70 | Resp 18 | Ht 65.25 in | Wt 191.0 lb

## 2011-05-24 DIAGNOSIS — R079 Chest pain, unspecified: Secondary | ICD-10-CM

## 2011-05-24 DIAGNOSIS — E1159 Type 2 diabetes mellitus with other circulatory complications: Secondary | ICD-10-CM

## 2011-05-24 DIAGNOSIS — I10 Essential (primary) hypertension: Secondary | ICD-10-CM

## 2011-05-24 DIAGNOSIS — I798 Other disorders of arteries, arterioles and capillaries in diseases classified elsewhere: Secondary | ICD-10-CM

## 2011-05-24 MED ORDER — METFORMIN HCL 1000 MG PO TABS: 1000.0000 mg | ORAL_TABLET | Freq: Two times a day (BID) | ORAL | Status: DC

## 2011-05-24 MED ORDER — GABAPENTIN 300 MG PO CAPS: 300.0000 mg | ORAL_CAPSULE | Freq: Three times a day (TID) | ORAL | Status: DC

## 2011-05-24 MED ORDER — GLIPIZIDE 10 MG PO TABS: 10.0000 mg | ORAL_TABLET | Freq: Two times a day (BID) | ORAL | Status: DC

## 2011-05-24 NOTE — Telephone Encounter (Signed)
Please send him a letter with the appt time

## 2011-05-24 NOTE — Assessment & Plan Note (Signed)
Uncontrolled diabetes mellitus with A1c of 10.5%. He was only taking metformin 500 mg twice a day however this bottle was from June of 2012. Will change him to thousand milligrams twice a day and increase his glipizide to 10 mg twice a day. I fear he may need Lantus injections however I am not sure he will able to comprehend this and may need a nurse for a week or so to show him injections Renal function normal on metabolic pabel

## 2011-05-24 NOTE — Assessment & Plan Note (Signed)
Will refer him to cardiology, today he brought in papers where is was on Plaix for these are a few years old, I am not sure if he still needs to be on this, he is taking Aspirin, he may a good candidate for a stress myoview, I am also not sure when his pacer was last checked.

## 2011-05-24 NOTE — Patient Instructions (Addendum)
Take 2 medicines for diabetes, your blood sugar is too high A1C 10.5%  Take the metformin 1 tablet twice a day  Take the glipizide  1 tablet twice a day  For your blood pressure- continue the pill you just picked up- once a day  Continue your aspirin once a day I will refer you to the heart doctor for your chest pain Keep taking your blood sugar- before you eat in the morning and in the evening Watch the sugar, cut back on bread, sweet tea, cookies , milkshakes, eat fresh veggies, drink plenty of water Walk 30 minutes a day  F/U 6 weeks

## 2011-05-24 NOTE — Assessment & Plan Note (Signed)
BP improved with medication, we discussed his diet, he is eating many fried foods, fast foods, drinking sweet tea and drinking milkshakes

## 2011-05-24 NOTE — Progress Notes (Signed)
  Subjective:    Patient ID: [name], male    DOB: 11-Nov-1950, 61 y.o.   MRN: [id number]  HPI  Patient here to followup labs and medications. He was seen as a new patient last week and was confused about his medications. He is taking his lisinopril HCTZ which was prescribed for blood pressure without any concerns.  Review of Systems - per above     Objective:   Physical Exam GEN-NAD,alerta and oriented x 3        Assessment & Plan:

## 2011-05-31 NOTE — Telephone Encounter (Signed)
Letter has been sent

## 2011-06-08 ENCOUNTER — Ambulatory Visit: Payer: Medicare Other | Admitting: Cardiology

## 2011-06-21 ENCOUNTER — Other Ambulatory Visit: Payer: Self-pay | Admitting: Family Medicine

## 2011-07-05 ENCOUNTER — Ambulatory Visit (INDEPENDENT_AMBULATORY_CARE_PROVIDER_SITE_OTHER): Payer: Medicare Other | Admitting: Family Medicine

## 2011-07-05 ENCOUNTER — Encounter: Payer: Self-pay | Admitting: Family Medicine

## 2011-07-05 VITALS — BP 136/80 | HR 89 | Resp 18 | Ht 65.25 in | Wt 189.0 lb

## 2011-07-05 DIAGNOSIS — E1159 Type 2 diabetes mellitus with other circulatory complications: Secondary | ICD-10-CM

## 2011-07-05 DIAGNOSIS — I2510 Atherosclerotic heart disease of native coronary artery without angina pectoris: Secondary | ICD-10-CM

## 2011-07-05 DIAGNOSIS — E785 Hyperlipidemia, unspecified: Secondary | ICD-10-CM

## 2011-07-05 DIAGNOSIS — G609 Hereditary and idiopathic neuropathy, unspecified: Secondary | ICD-10-CM

## 2011-07-05 DIAGNOSIS — I798 Other disorders of arteries, arterioles and capillaries in diseases classified elsewhere: Secondary | ICD-10-CM

## 2011-07-05 DIAGNOSIS — I10 Essential (primary) hypertension: Secondary | ICD-10-CM

## 2011-07-05 MED ORDER — SIMVASTATIN 10 MG PO TABS: 10.0000 mg | ORAL_TABLET | Freq: Every evening | ORAL | Status: DC

## 2011-07-05 NOTE — Progress Notes (Signed)
  Subjective:    Patient ID: [name], male    DOB: 1951-02-14, 61 y.o.   MRN: [id number]  HPI Pt here for routine visit  To follow - up DM He has had a mild sore throat but this is improved, he also has some dry mouth Blood sugars have been running 200's , highest 400 in the morning, he was not able to see the cardiologist and needs this rescheduled   Review of Systems   GEN- denies fatigue, fever, weight loss,weakness, recent illness HEENT- denies eye drainage, change in vision, nasal discharge, CVS- denies chest pain, palpitations RESP- denies SOB, cough, wheeze ABD- denies N/V, change in stools, abd pain GU- denies dysuria, hematuria, dribbling, incontinence MSK- denies joint pain, muscle aches, injury Neuro- denies headache, dizziness, syncope, seizure activity      Objective:   Physical Exam GEN- NAD, alert and oriented x3, pleasant HEENT- PERRL, EOMI, non injected sclera, pink conjunctiva, MMM, oropharynx clear, TM clear bilat Neck- Supple, +carotid bruit CVS- RRR, no murmur RESP-CTAB ABD- NABS, soft, NT,ND EXT- No edema Pulses- Radial, DP- 2+,  Feet- long toenails, thick, no open lesions, callus build-up Diabetic foot exam below      Assessment & Plan:

## 2011-07-05 NOTE — Patient Instructions (Signed)
Continue your diabetes pills twice a day Keep checking your blood sugar  Take the medicine for your cholesterol Continue your aspirin  Please go to appt with heart doctor  Work on the food choices- no added sugar, no sweets, no soda,  F/U  1st week of July- we will get blood work that done

## 2011-07-06 ENCOUNTER — Encounter: Payer: Self-pay | Admitting: Family Medicine

## 2011-07-06 NOTE — Assessment & Plan Note (Signed)
Unchanged, stressed importance of trying to control DM

## 2011-07-06 NOTE — Assessment & Plan Note (Signed)
Start zocor at bedtime goal LDL < 100

## 2011-07-06 NOTE — Assessment & Plan Note (Signed)
Will reschedule with cardiology Statin drug therapy, continue ASA

## 2011-07-06 NOTE — Assessment & Plan Note (Signed)
BP much improved, continue current medications.

## 2011-07-06 NOTE — Assessment & Plan Note (Signed)
His A1C is quite elevated, His blood sugars have improved but his diet is very poor, time spent on counseling for diabetic nutrition. Will continue current medications

## 2011-08-16 ENCOUNTER — Ambulatory Visit: Payer: Medicare Other | Admitting: Family Medicine

## 2011-09-09 ENCOUNTER — Telehealth: Payer: Self-pay | Admitting: Family Medicine

## 2011-09-09 MED ORDER — LISINOPRIL-HYDROCHLOROTHIAZIDE 20-12.5 MG PO TABS: ORAL_TABLET | ORAL | Status: DC

## 2011-09-09 NOTE — Telephone Encounter (Signed)
Sent in

## 2011-09-20 ENCOUNTER — Ambulatory Visit (INDEPENDENT_AMBULATORY_CARE_PROVIDER_SITE_OTHER): Payer: Medicare Other | Admitting: Family Medicine

## 2011-09-20 ENCOUNTER — Encounter: Payer: Self-pay | Admitting: Family Medicine

## 2011-09-20 VITALS — BP 122/70 | HR 66 | Resp 16 | Ht 65.25 in | Wt 188.0 lb

## 2011-09-20 DIAGNOSIS — I10 Essential (primary) hypertension: Secondary | ICD-10-CM

## 2011-09-20 DIAGNOSIS — G609 Hereditary and idiopathic neuropathy, unspecified: Secondary | ICD-10-CM

## 2011-09-20 DIAGNOSIS — E785 Hyperlipidemia, unspecified: Secondary | ICD-10-CM

## 2011-09-20 DIAGNOSIS — Z125 Encounter for screening for malignant neoplasm of prostate: Secondary | ICD-10-CM

## 2011-09-20 DIAGNOSIS — I798 Other disorders of arteries, arterioles and capillaries in diseases classified elsewhere: Secondary | ICD-10-CM

## 2011-09-20 DIAGNOSIS — E1159 Type 2 diabetes mellitus with other circulatory complications: Secondary | ICD-10-CM

## 2011-09-20 DIAGNOSIS — I2510 Atherosclerotic heart disease of native coronary artery without angina pectoris: Secondary | ICD-10-CM

## 2011-09-20 DIAGNOSIS — E119 Type 2 diabetes mellitus without complications: Secondary | ICD-10-CM

## 2011-09-20 NOTE — Assessment & Plan Note (Addendum)
Check A1C, previously uncontrolled , on ACEI, urine Micro sent  Needs diabetic shoes- [name] to evaluate him [phone]

## 2011-09-20 NOTE — Assessment & Plan Note (Signed)
He still has not seen cardiology, pacer in place, previously on plavix, now on ASA, will make another appt for Sept

## 2011-09-20 NOTE — Progress Notes (Signed)
  Subjective:    Patient ID: [name], male    DOB: 10-Feb-1951, 61 y.o.   MRN: [id number]  HPI Pt here to f/u Chronic medical problems Neuropathy- His tingling in feet is still present and burning sensation R>L DM- CBG less than 200 fasting , taking meds as prescribed , no hypoglycemia Due for labs  Medications reviewed    Review of Systems  GEN- denies fatigue, fever, weight loss,weakness, recent illness HEENT- denies eye drainage, change in vision, nasal discharge, CVS- denies chest pain, palpitations RESP- denies SOB, cough, wheeze ABD- denies N/V, change in stools, abd pain GU- denies dysuria, hematuria, dribbling, incontinence MSK- denies joint pain, muscle aches, injury Neuro- denies headache, dizziness, syncope, seizure activity      Objective:   Physical Exam GEN- NAD, alert and oriented x3 HEENT- PERRL, EOMI, non injected sclera, pink conjunctiva, MMM, oropharynx clear Neck- Supple, no thryomegaly CVS- RRR, no murmur RESP-CTAB ABD-NABS,soft,NT,ND  EXT- No edema Pulses- Radial, DP- 2+ Diabetic Foot- thickened nails callus bilat, small hyperpigmented lesion beneath left great toenail, decreased monofilament bilat         Assessment & Plan:  Unable to afford podiatry right now

## 2011-09-20 NOTE — Assessment & Plan Note (Signed)
BP at goal no change to meds 

## 2011-09-20 NOTE — Assessment & Plan Note (Signed)
He was not aware he had gabapentin and has only been taking BID, increase to TID as prescribed, f/u 1 week for lab review

## 2011-09-20 NOTE — Assessment & Plan Note (Signed)
Check Direct LDL started on Zocor 10mg 

## 2011-09-20 NOTE — Patient Instructions (Signed)
F/U in 1 week for the labs  Gabapentin- take 1 tablet in morning , 1 in afternoon, and 1 at bedtime for your feet  Continue all other medications Appt for heart doctor to be done- in September

## 2011-09-21 ENCOUNTER — Other Ambulatory Visit: Payer: Self-pay

## 2011-09-21 LAB — COMPREHENSIVE METABOLIC PANEL
ALT: 9 U/L (ref 0–53)
Albumin: 4 g/dL (ref 3.5–5.2)
CO2: 27 mEq/L (ref 19–32)
Potassium: 4.1 mEq/L (ref 3.5–5.3)
Sodium: 138 mEq/L (ref 135–145)
Total Bilirubin: 0.5 mg/dL (ref 0.3–1.2)
Total Protein: 6.9 g/dL (ref 6.0–8.3)

## 2011-09-21 LAB — HEMOGLOBIN A1C: Hgb A1c MFr Bld: 10.9 % — ABNORMAL HIGH (ref <5.7)

## 2011-09-21 MED ORDER — GABAPENTIN 300 MG PO CAPS: 300.0000 mg | ORAL_CAPSULE | Freq: Three times a day (TID) | ORAL | Status: DC

## 2011-09-22 MED ORDER — INSULIN GLARGINE 100 UNIT/ML ~~LOC~~ SOLN: SUBCUTANEOUS | Status: DC

## 2011-09-22 NOTE — Addendum Note (Signed)
Addended by: Milinda Antis F on: 09/22/2011 08:17 AM   Modules accepted: Orders

## 2011-09-26 ENCOUNTER — Other Ambulatory Visit: Payer: Self-pay

## 2011-09-26 MED ORDER — GABAPENTIN 300 MG PO CAPS: 300.0000 mg | ORAL_CAPSULE | Freq: Three times a day (TID) | ORAL | Status: DC

## 2011-09-27 ENCOUNTER — Encounter: Payer: Self-pay | Admitting: Family Medicine

## 2011-09-27 ENCOUNTER — Ambulatory Visit (INDEPENDENT_AMBULATORY_CARE_PROVIDER_SITE_OTHER): Payer: Medicare Other | Admitting: Family Medicine

## 2011-09-27 VITALS — BP 122/78 | HR 62 | Resp 18 | Ht 65.25 in | Wt 189.1 lb

## 2011-09-27 DIAGNOSIS — E1159 Type 2 diabetes mellitus with other circulatory complications: Secondary | ICD-10-CM

## 2011-09-27 DIAGNOSIS — E785 Hyperlipidemia, unspecified: Secondary | ICD-10-CM

## 2011-09-27 DIAGNOSIS — I798 Other disorders of arteries, arterioles and capillaries in diseases classified elsewhere: Secondary | ICD-10-CM

## 2011-09-27 NOTE — Progress Notes (Signed)
  Subjective:    Patient ID: [name], male    DOB: 1950/09/23, 61 y.o.   MRN: [id number]  HPI  Pt here to f/u labs, no concerns, had difficulty getting some medications, but has all of them except insulin that was ordered. A1C deteriorated    Review of Systems - per above      Objective:   Physical Exam GEN-NAD,alert and oriented x 3       Assessment & Plan:

## 2011-09-27 NOTE — Patient Instructions (Signed)
Diabetic shoes Call us about the insulin Start Lantus 10 units at bedtime  F/U 4 weeks

## 2011-09-28 ENCOUNTER — Telehealth: Payer: Self-pay | Admitting: Family Medicine

## 2011-09-28 ENCOUNTER — Other Ambulatory Visit: Payer: Self-pay

## 2011-09-28 MED ORDER — INSULIN PEN NEEDLE 31G X 5 MM MISC: 10.0000 [IU] | Freq: Every day | Status: DC

## 2011-09-28 NOTE — Assessment & Plan Note (Signed)
LDL at goal continue statin therapy.

## 2011-09-28 NOTE — Telephone Encounter (Signed)
Pen needles sent

## 2011-09-28 NOTE — Assessment & Plan Note (Signed)
Plan to start Lantus 10 units qhs, given instruction by nurse today, samples of Lantus given from office, we will call and confirm with pharmacy why he was not able to get this, he tells me he has some insulin at home but does not know the name of it, but never used it, given by previous PCP, he did not think he needed it. Continue oral medicaitons

## 2011-10-24 ENCOUNTER — Encounter: Payer: Self-pay | Admitting: Cardiology

## 2011-10-24 ENCOUNTER — Ambulatory Visit (INDEPENDENT_AMBULATORY_CARE_PROVIDER_SITE_OTHER): Payer: Medicare Other | Admitting: Cardiology

## 2011-10-24 VITALS — BP 106/57 | HR 69 | Ht 65.0 in | Wt 187.0 lb

## 2011-10-24 DIAGNOSIS — I739 Peripheral vascular disease, unspecified: Secondary | ICD-10-CM | POA: Insufficient documentation

## 2011-10-24 DIAGNOSIS — I679 Cerebrovascular disease, unspecified: Secondary | ICD-10-CM

## 2011-10-24 DIAGNOSIS — E669 Obesity, unspecified: Secondary | ICD-10-CM | POA: Insufficient documentation

## 2011-10-24 DIAGNOSIS — I70209 Unspecified atherosclerosis of native arteries of extremities, unspecified extremity: Secondary | ICD-10-CM | POA: Insufficient documentation

## 2011-10-24 DIAGNOSIS — G4733 Obstructive sleep apnea (adult) (pediatric): Secondary | ICD-10-CM | POA: Insufficient documentation

## 2011-10-24 DIAGNOSIS — I495 Sick sinus syndrome: Secondary | ICD-10-CM | POA: Insufficient documentation

## 2011-10-24 DIAGNOSIS — F17201 Nicotine dependence, unspecified, in remission: Secondary | ICD-10-CM | POA: Insufficient documentation

## 2011-10-24 DIAGNOSIS — E1151 Type 2 diabetes mellitus with diabetic peripheral angiopathy without gangrene: Secondary | ICD-10-CM

## 2011-10-24 DIAGNOSIS — I10 Essential (primary) hypertension: Secondary | ICD-10-CM

## 2011-10-24 DIAGNOSIS — D649 Anemia, unspecified: Secondary | ICD-10-CM | POA: Insufficient documentation

## 2011-10-24 DIAGNOSIS — E119 Type 2 diabetes mellitus without complications: Secondary | ICD-10-CM

## 2011-10-24 DIAGNOSIS — I7090 Unspecified atherosclerosis: Secondary | ICD-10-CM

## 2011-10-24 DIAGNOSIS — J449 Chronic obstructive pulmonary disease, unspecified: Secondary | ICD-10-CM | POA: Insufficient documentation

## 2011-10-24 DIAGNOSIS — Z87891 Personal history of nicotine dependence: Secondary | ICD-10-CM

## 2011-10-24 DIAGNOSIS — I2510 Atherosclerotic heart disease of native coronary artery without angina pectoris: Secondary | ICD-10-CM

## 2011-10-24 DIAGNOSIS — R0989 Other specified symptoms and signs involving the circulatory and respiratory systems: Secondary | ICD-10-CM

## 2011-10-24 DIAGNOSIS — E785 Hyperlipidemia, unspecified: Secondary | ICD-10-CM

## 2011-10-24 NOTE — Assessment & Plan Note (Signed)
The patient does remarkably well with moderate to severe lower extremity disease.  Further evaluation will be performed if and when his symptoms become limiting.

## 2011-10-24 NOTE — Assessment & Plan Note (Signed)
Control of diabetes is suboptimal.  Dr. Jeanice Lim is working with patient to improve his treatment.

## 2011-10-24 NOTE — Patient Instructions (Addendum)
Your physician recommends that you schedule a follow-up appointment in:  1 - 1 year with provider  2 - next available with Dr Ladona Ridgel  Your physician has requested that you have a carotid duplex. This test is an ultrasound of the carotid arteries in your neck. It looks at blood flow through these arteries that supply the brain with blood. Allow one hour for this exam. There are no restrictions or special instructions.

## 2011-10-24 NOTE — Assessment & Plan Note (Addendum)
Patient continues to do well 10 years following CABG surgery.  Cardiology care will concentrate on optimal management of cardiovascular risk factors.  His chest discomfort is mild, intermittent and unlikely to reflect myocardial ischemia.  No testing is necessary unless symptoms worsen.

## 2011-10-24 NOTE — Assessment & Plan Note (Signed)
Minimal anemia.  Dr. Jeanice Lim may wish to perform iron studies and stool Hemoccult testing if not previously undertaken.

## 2011-10-24 NOTE — Assessment & Plan Note (Signed)
Blood pressure control has been good in recent months, and current medications will be continued.

## 2011-10-24 NOTE — Assessment & Plan Note (Signed)
Lipid profile is at goal with very low-dose statin therapy, which will be continued.

## 2011-10-24 NOTE — Progress Notes (Signed)
Patient ID: [name], male   DOB: 10-09-1950, 61 y.o.   MRN: [id number]  HPI: Pleasant gentleman with widespread atherosclerosis including coronary artery disease, cerebrovascular disease and peripheral vascular disease, who now returns at the kind request of Dr. Jeanice Lim to reestablish specialty cardiology care after having been lost to followup for the past 4 years.  He was evaluated by Dr. Ladona Ridgel 3 years ago when he was found to have depletion of his pacemaker generator, which was replaced, but has also been lost to followup by pacemaker clinic.  He reports vague chest discomfort that occurs unpredictably and infrequently.  This is located in the lower mid and left chest, is poorly characterized, is relatively mild, and resolves spontaneously in a short period of time.  He remains fairly active including mowing his lawn without any exercise related dyspnea or chest discomfort; however, with prompting, he does describe some claudication.  Diabetic control is suboptimal based on recent A1c level in excess of 10.  Blood pressure control has apparently been good.  Current Outpatient Prescriptions on File Prior to Visit  Medication Sig Dispense Refill  . aspirin 81 MG tablet Take 81 mg by mouth daily.        Marland Kitchen gabapentin (NEURONTIN) 300 MG capsule Take 1 capsule (300 mg total) by mouth 3 (three) times daily.  90 capsule  3  . glipiZIDE (GLUCOTROL) 10 MG tablet Take 1 tablet (10 mg total) by mouth 2 (two) times daily before a meal.  60 tablet  3  . insulin glargine (LANTUS SOLOSTAR) 100 UNIT/ML injection Inject 10-50 units as directed at bedtime  15 mL  2  . Insulin Pen Needle (B-D UF III MINI PEN NEEDLES) 31G X 5 MM MISC 10 Units by Does not apply route daily.  30 each  2  . lisinopril-hydrochlorothiazide (PRINZIDE,ZESTORETIC) 20-12.5 MG per tablet TAKE 1 TABLET BY MOUTH DAILY  30 tablet  3  . metFORMIN (GLUCOPHAGE) 1000 MG tablet Take 1 tablet (1,000 mg total) by mouth 2 (two) times daily with a meal.   60 tablet  3  . simvastatin (ZOCOR) 10 MG tablet Take 1 tablet (10 mg total) by mouth every evening.  30 tablet  3   No Known Allergies    Past Medical History  Diagnosis Date  . Arteriosclerotic cardiovascular disease (ASCVD)     CABG in 2003 for 3 VD; 07/2007: Stress nuclear-inferior infarction without ischemia; normal EF by echo in 06/2005  . Diabetes mellitus type II   . Hyperlipidemia   . Degenerative joint disease     Left knee  . Cerebrovascular disease 2003    Carotid ultrasound in 2010: Patent right CEA site, 60-79% left internal carotid artery  . Peripheral vascular disease 2007    aortobifemoral BPG-2007  . Anemia   . Peripheral neuropathy   . Hypertension   . Sick sinus syndrome 1996    permanent pacemaker-1996; generator change in 2010  . Chronic obstructive pulmonary disease   . Obesity   . Obstructive sleep apnea   . Benign prostatic hypertrophy   . Gastroesophageal reflux disease   . Tobacco abuse, in remission     Quit in 2007     Past Surgical History  Procedure Date  . Coronary artery bypass graft 2003    three-vessel disease  . Iliac artery stent     Percutaneous intervention-right external iliac; unclear whether stent was placed  . Pacemaker insertion 1996    generator change in 2010  . Carotid  endarterectomy 2003    Right  . Pacemaker insertion     Generator replaced in 04/2008  . Aorta - bilateral femoral artery bypass graft 2007  . Femoral-popliteal bypass graft 1998    bilateral     Family History  Problem Relation Age of Onset  . Dementia Mother   . Heart attack Father   . Coronary artery disease Father   . Peripheral vascular disease Sister   . Diabetes Brother   . Peripheral vascular disease Brother   . Hypertension Brother   . Coronary artery disease Brother      History   Social History  . Marital Status: Married    Spouse Name: N/A    Number of Children: N/A  . Years of Education: 9   Occupational History  . 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 Smoking status: Former Smoker -- 2.0 packs/day for 30 years    Types: Cigarettes  . Smokeless tobacco: Former Neurosurgeon    Quit date: 02/16/1996  . Alcohol Use: No  . Drug Use: No  . Sexually Active: Not on file   Other Topics Concern  . Not on file   Social History Narrative   Lives with wife"slacker in school" - up to 9th grade at [employer]     ROS: Denies dyspnea, orthopnea, PND, neurologic symptoms, palpitations, lightheadedness or syncope. Patient notes decreased energy, chronic discomfort in the right pharynx when he swallows-consultation with an otolaryngologist advised, negative colonoscopy in 2012 and seasonal allergies.   All other systems reviewed and are negative.  PHYSICAL EXAM: BP 106/57  Pulse 69  Ht 5\' 5"  (1.651 m)  Wt 84.823 kg (187 lb)  BMI 31.12 kg/m2   General-Well-developed; no acute distress Body Habitus-Mildly overweight  HEENT-/AT; PERRL; EOM intact; conjunctiva and lids nl Neck-No JVD; Bilateral carotid bruits Endocrine-No thyromegaly; transverse surgical scar at the base of the neck Lungs-Clear lung fields; resonant percussion; normal I-to-E ratio Cardiovascular- normal PMI; normal S1 and S2; modest systolic ejection murmur Abdomen-BS normal; soft and non-tender without masses or organomegaly; no bruits; aortic pulsation not palpable Musculoskeletal-No deformities, cyanosis or clubbing Neurologic-Nl cranial nerves; symmetric strength and tone Skin- Warm, no significant lesions Extremities-Markedly reduced to absent distal pulses; Trace edema.  Deformed pulse contour is obtained with difficulty from both dorsalis pedis and posterior tibial vessels.  EKG:  Tracing performed 05/2011 obtained and reviewed.  Atrial pacing, prior inferior MI, markedly delayed R-wave progression->prior anteroseptal MI, borderline low voltage, ST-T wave abnormality consistent with LVH or  lateral ischemia.  ASSESSMENT AND PLAN:  [place] Bing, MD 10/24/2011 12:04 PM

## 2011-10-24 NOTE — Assessment & Plan Note (Signed)
Patient had moderately severe left ICA disease in 2010; repeat imaging will be performed.

## 2011-10-24 NOTE — Assessment & Plan Note (Signed)
Patient will be re-enrolled in [facility]emaker [facility].

## 2011-10-25 ENCOUNTER — Ambulatory Visit: Payer: Medicare Other | Admitting: Family Medicine

## 2011-10-26 ENCOUNTER — Ambulatory Visit (HOSPITAL_COMMUNITY)
Admission: RE | Admit: 2011-10-26 | Discharge: 2011-10-26 | Disposition: A | Discharge status: Home / Self Care | Payer: Medicare Other | Source: Ambulatory Visit | Attending: Cardiology | Admitting: Cardiology

## 2011-10-26 ENCOUNTER — Encounter: Payer: Self-pay | Admitting: Family Medicine

## 2011-10-26 DIAGNOSIS — I6529 Occlusion and stenosis of unspecified carotid artery: Secondary | ICD-10-CM | POA: Insufficient documentation

## 2011-10-26 DIAGNOSIS — R0989 Other specified symptoms and signs involving the circulatory and respiratory systems: Secondary | ICD-10-CM

## 2011-10-27 ENCOUNTER — Encounter: Payer: Self-pay | Admitting: *Deleted

## 2011-10-27 ENCOUNTER — Encounter: Payer: Self-pay | Admitting: Cardiology

## 2011-10-28 ENCOUNTER — Telehealth: Payer: Self-pay | Admitting: Family Medicine

## 2011-10-28 ENCOUNTER — Telehealth: Payer: Self-pay | Admitting: Cardiology

## 2011-10-28 DIAGNOSIS — I10 Essential (primary) hypertension: Secondary | ICD-10-CM

## 2011-10-28 DIAGNOSIS — E119 Type 2 diabetes mellitus without complications: Secondary | ICD-10-CM

## 2011-10-28 NOTE — Telephone Encounter (Signed)
Results of lab work / tg  °

## 2011-10-30 ENCOUNTER — Other Ambulatory Visit: Payer: Self-pay | Admitting: Family Medicine

## 2011-11-04 ENCOUNTER — Other Ambulatory Visit: Payer: Self-pay

## 2011-11-04 MED ORDER — INSULIN GLARGINE 100 UNIT/ML ~~LOC~~ SOLN: SUBCUTANEOUS | Status: DC

## 2011-11-04 MED ORDER — INSULIN PEN NEEDLE 31G X 5 MM MISC: 10.0000 [IU] | Freq: Every day | Status: DC

## 2011-11-04 NOTE — Telephone Encounter (Signed)
Referral sent 

## 2011-11-04 NOTE — Telephone Encounter (Signed)
Refill sent.  Please advise on referral.

## 2011-11-16 ENCOUNTER — Encounter: Payer: Self-pay | Admitting: *Deleted

## 2011-11-16 DIAGNOSIS — Z950 Presence of cardiac pacemaker: Secondary | ICD-10-CM | POA: Insufficient documentation

## 2011-11-21 ENCOUNTER — Encounter: Payer: Medicare Other | Admitting: Internal Medicine

## 2011-11-21 ENCOUNTER — Other Ambulatory Visit: Payer: Self-pay | Admitting: Family Medicine

## 2011-11-21 ENCOUNTER — Ambulatory Visit: Payer: Medicare Other | Admitting: Family Medicine

## 2011-11-22 ENCOUNTER — Encounter: Payer: Self-pay | Admitting: Family Medicine

## 2011-11-22 ENCOUNTER — Ambulatory Visit (INDEPENDENT_AMBULATORY_CARE_PROVIDER_SITE_OTHER): Payer: Medicare Other | Admitting: Family Medicine

## 2011-11-22 VITALS — BP 128/78 | HR 79 | Resp 18 | Ht 65.25 in | Wt 192.0 lb

## 2011-11-22 DIAGNOSIS — I10 Essential (primary) hypertension: Secondary | ICD-10-CM

## 2011-11-22 DIAGNOSIS — I739 Peripheral vascular disease, unspecified: Secondary | ICD-10-CM

## 2011-11-22 DIAGNOSIS — E119 Type 2 diabetes mellitus without complications: Secondary | ICD-10-CM

## 2011-11-22 MED ORDER — METFORMIN HCL 1000 MG PO TABS: 1000.0000 mg | ORAL_TABLET | Freq: Two times a day (BID) | ORAL | Status: DC

## 2011-11-22 MED ORDER — INSULIN GLARGINE 100 UNIT/ML ~~LOC~~ SOLN: SUBCUTANEOUS | Status: DC

## 2011-11-22 MED ORDER — GLIPIZIDE 10 MG PO TABS: 10.0000 mg | ORAL_TABLET | Freq: Two times a day (BID) | ORAL | Status: DC

## 2011-11-22 MED ORDER — LISINOPRIL-HYDROCHLOROTHIAZIDE 20-12.5 MG PO TABS: ORAL_TABLET | ORAL | Status: DC

## 2011-11-22 MED ORDER — SIMVASTATIN 10 MG PO TABS: 10.0000 mg | ORAL_TABLET | Freq: Every day | ORAL | Status: DC

## 2011-11-22 NOTE — Progress Notes (Signed)
  Subjective:    Patient ID: [name], male    DOB: 01/20/1951, 62 y.o.   MRN: [id number]  HPI Patient here to followup diabetes mellitus. His blood sugars have been in the 200s on Lantus 10 units. She's not had his insulin or 4 days. He was seen by cardiology to reestablish however missed his appointment yesterday with Dr. Ladona Ridgel for pacemaker check he states he was unaware of the appointment He had carotid ultrasound done however he thought he was on have ultrasound of his legs done. He states that his legs felt very heavy when he walks. He is history of peripheral vascular disease and is status post intervention with stenting. When he walks he has severe pain in his lower legs and it feels similar to before he had the stents placed  Review of Systems  GEN- denies fatigue, fever, weight loss,weakness, recent illness HEENT- denies eye drainage, change in vision, nasal discharge, CVS- denies chest pain, palpitations RESP- denies SOB, cough, wheeze ABD- denies N/V, change in stools, abd pain GU- denies dysuria, hematuria, dribbling, incontinence MSK- denies joint pain, muscle aches, injury Neuro- denies headache, dizziness, syncope, seizure activity      Objective:   Physical Exam GEN- NAD, alert and oriented x3 HEENT- PERRL, EOMI, non injected sclera, pink conjunctiva, MMM, oropharynx clear Neck- Supple, no thryomegaly CVS- RRR, no murmur RESP-CTAB EXT- No edema, no open foot lesions, thickened toenails Pulses- Radial 2+, DP-1+        Assessment & Plan:

## 2011-11-22 NOTE — Patient Instructions (Addendum)
Increase Lantus to 20 units at bedtime Continue other medications Call and reschedule with Dr. Ladona Ridgel  Ultrasound of your legs to be done  Flu shot given F/U 6 weeks

## 2011-11-23 NOTE — Assessment & Plan Note (Signed)
Well controlled 

## 2011-11-23 NOTE — Assessment & Plan Note (Addendum)
Uncontrolled, restart lantus increase to 20 units, A1C at next visit, will try to simplify regimen as much as possible he has difficulty keeping details straight Continue metformin and glipizide

## 2011-11-23 NOTE — Assessment & Plan Note (Signed)
He is having recurrent sym[ptoms consistent with his PVD, will obtain arterial ultrasound, will need referral back to vascular surgery

## 2011-11-24 ENCOUNTER — Ambulatory Visit (HOSPITAL_COMMUNITY)
Admission: RE | Admit: 2011-11-24 | Discharge: 2011-11-24 | Disposition: A | Discharge status: Home / Self Care | Payer: Medicare Other | Source: Ambulatory Visit | Attending: Family Medicine | Admitting: Family Medicine

## 2011-11-24 ENCOUNTER — Other Ambulatory Visit: Payer: Self-pay | Admitting: Family Medicine

## 2011-11-24 DIAGNOSIS — I739 Peripheral vascular disease, unspecified: Secondary | ICD-10-CM

## 2011-11-28 ENCOUNTER — Other Ambulatory Visit: Payer: Self-pay | Admitting: Family Medicine

## 2011-11-28 DIAGNOSIS — I739 Peripheral vascular disease, unspecified: Secondary | ICD-10-CM

## 2011-11-29 ENCOUNTER — Telehealth: Payer: Self-pay | Admitting: Family Medicine

## 2011-11-30 NOTE — Telephone Encounter (Signed)
Patient aware.

## 2011-11-30 NOTE — Telephone Encounter (Signed)
He can take tylenol, but if he is having severe pain or difficulty breathing he needs to come or go to ER

## 2011-11-30 NOTE — Telephone Encounter (Signed)
States everytime he breathes he has pain in his mid back on both right or left side and lower back. If he doesn't take the aspirin he hurts really bad. Had fever chills and bodyaches, but that has almost went away. Now its just the pain in his back. Has no money to come in and wants to know what you recommend

## 2011-12-01 ENCOUNTER — Other Ambulatory Visit (HOSPITAL_COMMUNITY): Payer: Medicare Other

## 2011-12-05 ENCOUNTER — Encounter: Payer: Self-pay | Admitting: Family Medicine

## 2011-12-05 ENCOUNTER — Ambulatory Visit (INDEPENDENT_AMBULATORY_CARE_PROVIDER_SITE_OTHER): Payer: Medicare Other | Admitting: Family Medicine

## 2011-12-05 VITALS — BP 126/80 | HR 71 | Resp 15 | Ht 65.25 in | Wt 191.1 lb

## 2011-12-05 DIAGNOSIS — I2510 Atherosclerotic heart disease of native coronary artery without angina pectoris: Secondary | ICD-10-CM

## 2011-12-05 DIAGNOSIS — J441 Chronic obstructive pulmonary disease with (acute) exacerbation: Secondary | ICD-10-CM

## 2011-12-05 DIAGNOSIS — E119 Type 2 diabetes mellitus without complications: Secondary | ICD-10-CM

## 2011-12-05 DIAGNOSIS — M549 Dorsalgia, unspecified: Secondary | ICD-10-CM

## 2011-12-05 DIAGNOSIS — R319 Hematuria, unspecified: Secondary | ICD-10-CM

## 2011-12-05 DIAGNOSIS — I7090 Unspecified atherosclerosis: Secondary | ICD-10-CM

## 2011-12-05 LAB — POCT URINALYSIS DIPSTICK
Bilirubin, UA: NEGATIVE
Glucose, UA: 500
Ketones, UA: NEGATIVE
Spec Grav, UA: 1.02

## 2011-12-05 MED ORDER — AZITHROMYCIN 250 MG PO TABS: ORAL_TABLET | ORAL | Status: DC

## 2011-12-05 MED ORDER — ALBUTEROL SULFATE HFA 108 (90 BASE) MCG/ACT IN AERS: 2.0000 | INHALATION_SPRAY | Freq: Four times a day (QID) | RESPIRATORY_TRACT | Status: DC | PRN

## 2011-12-05 NOTE — Assessment & Plan Note (Signed)
Small amount of blood noted in urine, no sign of infection, will recheck at next visit, he has appt with cardiology and Vascular surgery, will complete one task at a time as he can become very confused with his appt etc, he may need urology referral

## 2011-12-05 NOTE — Patient Instructions (Signed)
Increase lantus to 25 units  Use inhaler every 4 hours as needed for breathing  Take antibiotic as prescribed Use tylenol twice a day as needed Keep previous follow-up appointment

## 2011-12-05 NOTE — Assessment & Plan Note (Signed)
Uncontrolled, increase lantus to 25 units

## 2011-12-05 NOTE — Assessment & Plan Note (Signed)
Inhaler and antibiotics

## 2011-12-05 NOTE — Assessment & Plan Note (Signed)
Occasional chest pain, EKG reviewed with cardiology, no change to meds, given red flags on ASA

## 2011-12-05 NOTE — Progress Notes (Signed)
  Subjective:    Patient ID: [name], male    DOB: 1951/01/02, 61 y.o.   MRN: [id number]  HPI  Pt presents with mid back pain, abd pain, cough and wheeze x 8 days. He uses tylenol or aspirin and this helps . He had chest pain last week however did not seek emergency room care for this. Blood sugars have been elevated 200-300  Review of Systems  GEN- denies fatigue, fever, weight loss,weakness, recent illness HEENT- denies eye drainage, change in vision, nasal discharge, CVS- denies chest pain, palpitations RESP- denies SOB,+ cough,+ wheeze ABD- denies N/V, change in stools, abd pain GU- denies dysuria, hematuria, dribbling, incontinence MSK- denies joint pain, +muscle aches, injury Neuro- denies headache, dizziness, syncope, seizure activity      Objective:   Physical Exam GEN- NAD, alert and oriented x3 HEENT- PERRL, EOMI, non injected sclera, pink conjunctiva, MMM, oropharynx clear Neck- Supple, no LAD CVS- RRR, no murmur RESP-scattered wheeze, normal WOB ABS-NABS,soft,NT,ND  MSK- TTP lower thoracic region, near ribs, spine non tender, neg SLR  EXT- No edema Pulses- Radial, DP- 2+  EKG- reviewed with cardiology Old inferior and anterior infarct, paced, t wave inversions non specific      Assessment & Plan:

## 2011-12-09 ENCOUNTER — Emergency Department (HOSPITAL_COMMUNITY)
Admission: EM | Admit: 2011-12-09 | Discharge: 2011-12-09 | Disposition: A | Discharge status: Home / Self Care | Payer: Medicare Other | Attending: Emergency Medicine | Admitting: Emergency Medicine

## 2011-12-09 ENCOUNTER — Encounter (HOSPITAL_COMMUNITY): Payer: Self-pay | Admitting: *Deleted

## 2011-12-09 DIAGNOSIS — Z79899 Other long term (current) drug therapy: Secondary | ICD-10-CM | POA: Insufficient documentation

## 2011-12-09 DIAGNOSIS — Z950 Presence of cardiac pacemaker: Secondary | ICD-10-CM | POA: Insufficient documentation

## 2011-12-09 DIAGNOSIS — G4730 Sleep apnea, unspecified: Secondary | ICD-10-CM | POA: Insufficient documentation

## 2011-12-09 DIAGNOSIS — Z862 Personal history of diseases of the blood and blood-forming organs and certain disorders involving the immune mechanism: Secondary | ICD-10-CM | POA: Insufficient documentation

## 2011-12-09 DIAGNOSIS — E785 Hyperlipidemia, unspecified: Secondary | ICD-10-CM | POA: Insufficient documentation

## 2011-12-09 DIAGNOSIS — IMO0001 Reserved for inherently not codable concepts without codable children: Secondary | ICD-10-CM | POA: Insufficient documentation

## 2011-12-09 DIAGNOSIS — Z7982 Long term (current) use of aspirin: Secondary | ICD-10-CM | POA: Insufficient documentation

## 2011-12-09 DIAGNOSIS — I739 Peripheral vascular disease, unspecified: Secondary | ICD-10-CM | POA: Insufficient documentation

## 2011-12-09 DIAGNOSIS — J4489 Other specified chronic obstructive pulmonary disease: Secondary | ICD-10-CM | POA: Insufficient documentation

## 2011-12-09 DIAGNOSIS — G609 Hereditary and idiopathic neuropathy, unspecified: Secondary | ICD-10-CM | POA: Insufficient documentation

## 2011-12-09 DIAGNOSIS — Z87891 Personal history of nicotine dependence: Secondary | ICD-10-CM | POA: Insufficient documentation

## 2011-12-09 DIAGNOSIS — E669 Obesity, unspecified: Secondary | ICD-10-CM | POA: Insufficient documentation

## 2011-12-09 DIAGNOSIS — J449 Chronic obstructive pulmonary disease, unspecified: Secondary | ICD-10-CM | POA: Insufficient documentation

## 2011-12-09 DIAGNOSIS — M791 Myalgia, unspecified site: Secondary | ICD-10-CM

## 2011-12-09 DIAGNOSIS — I2510 Atherosclerotic heart disease of native coronary artery without angina pectoris: Secondary | ICD-10-CM | POA: Insufficient documentation

## 2011-12-09 DIAGNOSIS — Z8719 Personal history of other diseases of the digestive system: Secondary | ICD-10-CM | POA: Insufficient documentation

## 2011-12-09 DIAGNOSIS — Z951 Presence of aortocoronary bypass graft: Secondary | ICD-10-CM | POA: Insufficient documentation

## 2011-12-09 DIAGNOSIS — R7309 Other abnormal glucose: Secondary | ICD-10-CM | POA: Insufficient documentation

## 2011-12-09 DIAGNOSIS — E119 Type 2 diabetes mellitus without complications: Secondary | ICD-10-CM | POA: Insufficient documentation

## 2011-12-09 DIAGNOSIS — Z794 Long term (current) use of insulin: Secondary | ICD-10-CM | POA: Insufficient documentation

## 2011-12-09 DIAGNOSIS — R739 Hyperglycemia, unspecified: Secondary | ICD-10-CM

## 2011-12-09 DIAGNOSIS — N400 Enlarged prostate without lower urinary tract symptoms: Secondary | ICD-10-CM | POA: Insufficient documentation

## 2011-12-09 DIAGNOSIS — R109 Unspecified abdominal pain: Secondary | ICD-10-CM | POA: Insufficient documentation

## 2011-12-09 DIAGNOSIS — I10 Essential (primary) hypertension: Secondary | ICD-10-CM | POA: Insufficient documentation

## 2011-12-09 DIAGNOSIS — Z9889 Other specified postprocedural states: Secondary | ICD-10-CM | POA: Insufficient documentation

## 2011-12-09 LAB — POCT I-STAT, CHEM 8
Chloride: 101 mEq/L (ref 96–112)
Glucose, Bld: 233 mg/dL — ABNORMAL HIGH (ref 70–99)
HCT: 36 % — ABNORMAL LOW (ref 39.0–52.0)
Hemoglobin: 12.2 g/dL — ABNORMAL LOW (ref 13.0–17.0)
Potassium: 3.8 mEq/L (ref 3.5–5.1)
Sodium: 141 mEq/L (ref 135–145)

## 2011-12-09 LAB — URINE MICROSCOPIC-ADD ON

## 2011-12-09 LAB — URINALYSIS, ROUTINE W REFLEX MICROSCOPIC
Bilirubin Urine: NEGATIVE
Leukocytes, UA: NEGATIVE
Nitrite: NEGATIVE
Specific Gravity, Urine: 1.025 (ref 1.005–1.030)
Urobilinogen, UA: 0.2 mg/dL (ref 0.0–1.0)
pH: 5.5 (ref 5.0–8.0)

## 2011-12-09 MED ORDER — GI COCKTAIL ~~LOC~~
30.0000 mL | Freq: Once | ORAL | Status: AC
  Administered 2011-12-09: 30 mL via ORAL
  Filled 2011-12-09: qty 30

## 2011-12-09 MED ORDER — OXYCODONE-ACETAMINOPHEN 5-325 MG PO TABS: 1.0000 | ORAL_TABLET | Freq: Once | ORAL | Status: DC

## 2011-12-09 MED ORDER — OXYCODONE-ACETAMINOPHEN 5-325 MG PO TABS
1.0000 | ORAL_TABLET | Freq: Once | ORAL | Status: AC
  Administered 2011-12-09: 1 via ORAL
  Filled 2011-12-09: qty 1

## 2011-12-09 NOTE — ED Notes (Signed)
Pt states he got the flu from the flu shot. Pt aching all over & stomach cramping. Pt thinks he may have been running a fever. Pt was seen by PCP 2 or 3 days ago.e

## 2011-12-09 NOTE — ED Provider Notes (Signed)
History     CSN: [id number]  Arrival date & time 12/09/11  2042   First MD Initiated Contact with Patient 12/09/11 2100      Chief Complaint  Patient presents with  . Nausea  . Cough  . Generalized Body Aches  . Headache  . Abdominal Pain    (Consider location/radiation/quality/duration/timing/severity/associated sxs/prior treatment) HPI Comments: [name] is a 61 y.o. Male who presents with cramping abdominal pain for several days. He also has generalized achiness. He denies fever, chills, nausea, or vomiting. He completed a course of Zithromax. Today for "a lung infection" . He received a flu injection of 8 days ago. He denies chest pain, nausea, or vomiting. He has had bowel movements are hard in consistency. The last one was tonight. He has been able to eat. He denies change in urinary habits. There are no known modifying factors.  Patient is a 61 y.o. male presenting with cough, headaches, and abdominal pain. The history is provided by the patient.  Cough Associated symptoms include headaches.  Headache   Abdominal Pain The primary symptoms of the illness include abdominal pain.    Past Medical History  Diagnosis Date  . Arteriosclerotic cardiovascular disease (ASCVD)     CABG in 2003 for 3 VD; 07/2007: Stress nuclear-inferior infarction without ischemia; normal EF by echo in 06/2005  . Diabetes mellitus type II   . Hyperlipidemia   . Degenerative joint disease     Left knee  . Cerebrovascular disease 2003    Carotid ultrasound in 2010: Patent right CEA site, 60-79% left internal carotid artery  . Peripheral vascular disease 2007    aortobifemoral BPG-2007  . Anemia   . Peripheral neuropathy   . Hypertension   . Sick sinus syndrome 1996    permanent pacemaker-1996; generator change in 2010  . Chronic obstructive pulmonary disease   . Obesity   . Obstructive sleep apnea   . Benign prostatic hypertrophy   . Gastroesophageal reflux disease   . Tobacco  abuse, in remission     50 pack years total consumption; Quit in 2007    Past Surgical History  Procedure Date  . Coronary artery bypass graft 2003    three-vessel disease  . Iliac artery stent     Percutaneous intervention-right external iliac; unclear whether stent was placed  . Pacemaker insertion 1996    generator change in 2010  . Carotid endarterectomy 2003    Right  . Pacemaker insertion     Generator replaced in 04/2008  . Aorta - bilateral femoral artery bypass graft 2007  . Femoral-popliteal bypass graft 1998    bilateral  . Colonoscopy 2012    Negative screening study.    Family History  Problem Relation Age of Onset  . Dementia Mother   . Heart attack Father   . Coronary artery disease Father   . Peripheral vascular disease Sister   . Diabetes Brother   . Peripheral vascular disease Brother   . Hypertension Brother   . Coronary artery disease Brother     History  Substance Use Topics  . Smoking status: Former Smoker -- 2.0 packs/day for 25 years    Types: Cigarettes  . Smokeless tobacco: Former Neurosurgeon    Quit date: 02/16/1996  . Alcohol Use: No      Review of Systems  Respiratory: Positive for cough.   Gastrointestinal: Positive for abdominal pain.  Neurological: Positive for headaches.  All other systems reviewed and are negative.  Allergies  Review of patient's allergies indicates no known allergies.  Home Medications   Current Outpatient Rx  Name Route Sig Dispense Refill  . LISINOPRIL-HYDROCHLOROTHIAZIDE 20-12.5 MG PO TABS Oral Take 1 tablet by mouth daily.    . ALBUTEROL SULFATE HFA 108 (90 BASE) MCG/ACT IN AERS Inhalation Inhale 2 puffs into the lungs every 6 (six) hours as needed for wheezing. 1 Inhaler 2  . ASPIRIN 81 MG PO TABS Oral Take 81 mg by mouth daily.      . AZITHROMYCIN 250 MG PO TABS  Take 2 tablets (500 mg) on  Day 1,  followed by 1 tablet (250 mg) once daily on Days 2 through 5. 6 each 0  . GABAPENTIN 300 MG PO CAPS Oral  Take 1 capsule (300 mg total) by mouth 3 (three) times daily. 90 capsule 3  . GLIPIZIDE 10 MG PO TABS Oral Take 1 tablet (10 mg total) by mouth 2 (two) times daily before a meal. 60 tablet 3  . INSULIN GLARGINE 100 UNIT/ML [place] SOLN  Inject 10-50 units as directed at bedtime (uses 20 units)    . METFORMIN HCL 1000 MG PO TABS Oral Take 1 tablet (1,000 mg total) by mouth 2 (two) times daily with a meal. 60 tablet 4  . SIMVASTATIN 10 MG PO TABS Oral Take 1 tablet (10 mg total) by mouth at bedtime. 30 tablet 3    BP 193/79  Pulse 69  Temp 97.8 F (36.6 C) (Oral)  Resp 22  Ht 5\' 4"  (1.626 m)  Wt 198 lb (89.812 kg)  BMI 33.99 kg/m2  SpO2 100%  Physical Exam  Nursing note and vitals reviewed. Constitutional: He is oriented to person, place, and time. He appears well-developed and well-nourished.  HENT:  Head: Normocephalic and atraumatic.  Right Ear: External ear normal.  Left Ear: External ear normal.  Eyes: Conjunctivae normal and EOM are normal. Pupils are equal, round, and reactive to light.  Neck: Normal range of motion and phonation normal. Neck supple.  Cardiovascular: Normal rate, regular rhythm, normal heart sounds and intact distal pulses.   Pulmonary/Chest: Effort normal and breath sounds normal. He exhibits no bony tenderness.  Abdominal: Soft. Normal appearance. There is no tenderness.       Bowel sounds are hyperactive.  Musculoskeletal: Normal range of motion.  Neurological: He is alert and oriented to person, place, and time. He has normal strength. No cranial nerve deficit or sensory deficit. He exhibits normal muscle tone. Coordination normal.  Skin: Skin is warm, dry and intact.  Psychiatric: He has a normal mood and affect. His behavior is normal. Judgment and thought content normal.    ED Course  Procedures (including critical care time)  Nontoxic patient with normal vital signs. He'll be evaluated in ED, with anticipated discharge home.  Orthostatic vital signs,  negative  Emergency department treatment: GI cocktail.   Date: 12/02/2011  Rate: 73  Rhythm: electronic atrial pacing and PVC  QRS Axis: normal  PR and QT Intervals: PR prolonged  ST/T Wave abnormalities: nonspecific T wave changes  PR and QRS Conduction Disutrbances: Prolonged AV conduction  Narrative Interpretation: Poor R wave progression, inferior Q wave  Old EKG Reviewed: unchanged   Nursing notes,  applicable records and vitals reviewed.    22:05- after treatment with GI cocktail and oral fluids, the patient states he has back pain. He says that is the problem he was here for, and he thinks it hi from his lung. He is denying  abdominal pain at this time. Patient is insistent that he receive medication for back pain. He requests a shot. Percocet was ordered.    Labs Reviewed  URINALYSIS, ROUTINE W REFLEX MICROSCOPIC - Abnormal; Notable for the following:    Glucose, UA 500 (*)     Hgb urine dipstick SMALL (*)     Protein, ur 30 (*)     All other components within normal limits  POCT I-STAT, CHEM 8 - Abnormal; Notable for the following:    Glucose, Bld 233 (*)     Hemoglobin 12.2 (*)     HCT 36.0 (*)     All other components within normal limits  URINE MICROSCOPIC-ADD ON - Abnormal; Notable for the following:    Casts HYALINE CASTS (*)     All other components within normal limits   No results found.   1. Abdominal pain   2. Hyperglycemia   3. Myalgia       MDM    Symptom of abdominal cramping with recent use of a azithromycin, complicated by constipation. Doubt reaction to influenza vaccine, or worsening infection, process. Patient has mild, hyperglycemia, without ketosis. No overt volume depletion, but hyperglycemia may be causing some mild fluid loss.Doubt metabolic instability, serious bacterial infection or impending vascular collapse; the patient is stable for discharge.     Plan: Home Medications- usual, Percocet, trial Pepto-Bismol for cramping.  Consider Miralax for possible constipation.; Home Treatments- rest, increase fluids; Recommended follow up- PCP for check up in 1 week, and prn.    Care to Dr. Estell Harpin for disposition after the Percocet; if it controls the pain, he is stable for d/c.    Flint Melter, MD 12/09/11 2321

## 2011-12-09 NOTE — ED Notes (Signed)
Pt alert & oriented x4, stable gait. Patient  given discharge instructions, paperwork & prescription(s). Patient verbalized understanding. Pt left department w/ no further questions. 

## 2011-12-09 NOTE — ED Notes (Signed)
Pt states feeling aches and pains with nausea since receiving flu shot 8 days prior. BP in triage 183/78, pt states he is a diabetic with heart problems and wanted to get checked out.

## 2011-12-15 ENCOUNTER — Telehealth: Payer: Self-pay | Admitting: Family Medicine

## 2011-12-16 ENCOUNTER — Encounter: Payer: Medicare Other | Admitting: Vascular Surgery

## 2011-12-16 NOTE — Telephone Encounter (Signed)
Called patient and left message for them to return call at the office   

## 2011-12-23 NOTE — Telephone Encounter (Signed)
Pt went to ED for Abd pain and constipation and was advised to follow up with pcp. Pt never returned my call back

## 2011-12-26 ENCOUNTER — Encounter: Payer: Self-pay | Admitting: Family Medicine

## 2011-12-29 ENCOUNTER — Telehealth: Payer: Self-pay | Admitting: Family Medicine

## 2011-12-30 ENCOUNTER — Encounter: Payer: Medicare Other | Admitting: Vascular Surgery

## 2011-12-30 MED ORDER — BLOOD GLUCOSE TEST VI STRP: ORAL_STRIP | Status: DC

## 2011-12-30 NOTE — Telephone Encounter (Signed)
Sent to [place] as requested

## 2012-01-03 ENCOUNTER — Ambulatory Visit: Payer: Medicare Other | Admitting: Family Medicine

## 2012-01-09 ENCOUNTER — Telehealth: Payer: Self-pay | Admitting: Family Medicine

## 2012-01-09 NOTE — Telephone Encounter (Signed)
Has been trying to get in touch with patient no answer will keep trying

## 2012-01-09 NOTE — Telephone Encounter (Signed)
noted 

## 2012-01-20 ENCOUNTER — Emergency Department (HOSPITAL_COMMUNITY)
Admission: EM | Admit: 2012-01-20 | Discharge: 2012-01-20 | Disposition: A | Discharge status: Home / Self Care | Payer: Medicare Other | Attending: Emergency Medicine | Admitting: Emergency Medicine

## 2012-01-20 ENCOUNTER — Encounter (HOSPITAL_COMMUNITY): Payer: Self-pay | Admitting: *Deleted

## 2012-01-20 ENCOUNTER — Encounter: Payer: Medicare Other | Admitting: Vascular Surgery

## 2012-01-20 ENCOUNTER — Emergency Department (HOSPITAL_COMMUNITY): Payer: Medicare Other

## 2012-01-20 DIAGNOSIS — N400 Enlarged prostate without lower urinary tract symptoms: Secondary | ICD-10-CM | POA: Insufficient documentation

## 2012-01-20 DIAGNOSIS — Z794 Long term (current) use of insulin: Secondary | ICD-10-CM | POA: Insufficient documentation

## 2012-01-20 DIAGNOSIS — I2510 Atherosclerotic heart disease of native coronary artery without angina pectoris: Secondary | ICD-10-CM | POA: Insufficient documentation

## 2012-01-20 DIAGNOSIS — Z862 Personal history of diseases of the blood and blood-forming organs and certain disorders involving the immune mechanism: Secondary | ICD-10-CM | POA: Insufficient documentation

## 2012-01-20 DIAGNOSIS — Z7982 Long term (current) use of aspirin: Secondary | ICD-10-CM | POA: Insufficient documentation

## 2012-01-20 DIAGNOSIS — K219 Gastro-esophageal reflux disease without esophagitis: Secondary | ICD-10-CM | POA: Insufficient documentation

## 2012-01-20 DIAGNOSIS — M1610 Unilateral primary osteoarthritis, unspecified hip: Secondary | ICD-10-CM | POA: Insufficient documentation

## 2012-01-20 DIAGNOSIS — E785 Hyperlipidemia, unspecified: Secondary | ICD-10-CM | POA: Insufficient documentation

## 2012-01-20 DIAGNOSIS — Z79899 Other long term (current) drug therapy: Secondary | ICD-10-CM | POA: Insufficient documentation

## 2012-01-20 DIAGNOSIS — J449 Chronic obstructive pulmonary disease, unspecified: Secondary | ICD-10-CM | POA: Insufficient documentation

## 2012-01-20 DIAGNOSIS — Z8673 Personal history of transient ischemic attack (TIA), and cerebral infarction without residual deficits: Secondary | ICD-10-CM | POA: Insufficient documentation

## 2012-01-20 DIAGNOSIS — Z951 Presence of aortocoronary bypass graft: Secondary | ICD-10-CM | POA: Insufficient documentation

## 2012-01-20 DIAGNOSIS — M79606 Pain in leg, unspecified: Secondary | ICD-10-CM

## 2012-01-20 DIAGNOSIS — J4489 Other specified chronic obstructive pulmonary disease: Secondary | ICD-10-CM | POA: Insufficient documentation

## 2012-01-20 DIAGNOSIS — E669 Obesity, unspecified: Secondary | ICD-10-CM | POA: Insufficient documentation

## 2012-01-20 DIAGNOSIS — M549 Dorsalgia, unspecified: Secondary | ICD-10-CM | POA: Insufficient documentation

## 2012-01-20 DIAGNOSIS — Z87891 Personal history of nicotine dependence: Secondary | ICD-10-CM | POA: Insufficient documentation

## 2012-01-20 DIAGNOSIS — Z950 Presence of cardiac pacemaker: Secondary | ICD-10-CM | POA: Insufficient documentation

## 2012-01-20 DIAGNOSIS — I739 Peripheral vascular disease, unspecified: Secondary | ICD-10-CM | POA: Insufficient documentation

## 2012-01-20 DIAGNOSIS — E119 Type 2 diabetes mellitus without complications: Secondary | ICD-10-CM | POA: Insufficient documentation

## 2012-01-20 DIAGNOSIS — M169 Osteoarthritis of hip, unspecified: Secondary | ICD-10-CM | POA: Insufficient documentation

## 2012-01-20 DIAGNOSIS — M79609 Pain in unspecified limb: Secondary | ICD-10-CM | POA: Insufficient documentation

## 2012-01-20 DIAGNOSIS — I10 Essential (primary) hypertension: Secondary | ICD-10-CM | POA: Insufficient documentation

## 2012-01-20 MED ORDER — OXYCODONE-ACETAMINOPHEN 5-325 MG PO TABS: 1.0000 | ORAL_TABLET | Freq: Four times a day (QID) | ORAL | Status: DC | PRN

## 2012-01-20 MED ORDER — HYDROCODONE-ACETAMINOPHEN 5-325 MG PO TABS
2.0000 | ORAL_TABLET | Freq: Once | ORAL | Status: AC
  Administered 2012-01-20: 2 via ORAL
  Filled 2012-01-20: qty 2

## 2012-01-20 NOTE — ED Provider Notes (Signed)
History   This chart was scribed for Geoffery Lyons, MD by Charolett Bumpers, ED Scribe. The patient was seen in room APA12/APA12. Patient's care was started at 1237.    CSN: [id number]  Arrival date & time 01/20/12  1038   First MD Initiated Contact with Patient 01/20/12 1237      Chief Complaint  Patient presents with  . Leg Pain   The history is provided by the patient. No language interpreter was used.   [name] is a 61 y.o. male who presents to the Emergency Department complaining of constant, moderate left upper leg pain. He describes it as soreness that is aggravated with walking and movement. He denies any redness or warmth. He denies any fever. He reports associated lower left sided back pain. He has a several vascular surgeries on his left leg and has a h/o bypass.    Past Medical History  Diagnosis Date  . Arteriosclerotic cardiovascular disease (ASCVD)     CABG in 2003 for 3 VD; 07/2007: Stress nuclear-inferior infarction without ischemia; normal EF by echo in 06/2005  . Diabetes mellitus type II   . Hyperlipidemia   . Degenerative joint disease     Left knee  . Cerebrovascular disease 2003    Carotid ultrasound in 2010: Patent right CEA site, 60-79% left internal carotid artery  . Peripheral vascular disease 2007    aortobifemoral BPG-2007  . Anemia   . Peripheral neuropathy   . Hypertension   . Sick sinus syndrome 1996    permanent pacemaker-1996; generator change in 2010  . Chronic obstructive pulmonary disease   . Obesity   . Obstructive sleep apnea   . Benign prostatic hypertrophy   . Gastroesophageal reflux disease   . Tobacco abuse, in remission     50 pack years total consumption; Quit in 2007    Past Surgical History  Procedure Date  . Coronary artery bypass graft 2003    three-vessel disease  . Iliac artery stent     Percutaneous intervention-right external iliac; unclear whether stent was placed  . Pacemaker insertion 1996     generator change in 2010  . Carotid endarterectomy 2003    Right  . Pacemaker insertion     Generator replaced in 04/2008  . Aorta - bilateral femoral artery bypass graft 2007  . Femoral-popliteal bypass graft 1998    bilateral  . Colonoscopy 2012    Negative screening study.    Family History  Problem Relation Age of Onset  . Dementia Mother   . Heart attack Father   . Coronary artery disease Father   . Peripheral vascular disease Sister   . Diabetes Brother   . Peripheral vascular disease Brother   . Hypertension Brother   . Coronary artery disease Brother     History  Substance Use Topics  . Smoking status: Former Smoker -- 2.0 packs/day for 25 years    Types: Cigarettes  . Smokeless tobacco: Former Neurosurgeon    Quit date: 02/16/1996  . Alcohol Use: No      Review of Systems  Constitutional: Negative for fever.  Musculoskeletal: Positive for myalgias and back pain.       Left upper thigh pain.  All other systems reviewed and are negative.    Allergies  Review of patient's allergies indicates no known allergies.  Home Medications   Current Outpatient Rx  Name  Route  Sig  Dispense  Refill  . ALBUTEROL SULFATE HFA 108 (90 BASE)  MCG/ACT IN AERS   Inhalation   Inhale 2 puffs into the lungs every 6 (six) hours as needed for wheezing.   1 Inhaler   2   . ASPIRIN 81 MG PO TABS   Oral   Take 81 mg by mouth daily.           Marland Kitchen GABAPENTIN 300 MG PO CAPS   Oral   Take 1 capsule (300 mg total) by mouth 3 (three) times daily.   90 capsule   3   . GLIPIZIDE 10 MG PO TABS   Oral   Take 1 tablet (10 mg total) by mouth 2 (two) times daily before a meal.   60 tablet   3   . BLOOD GLUCOSE TEST VI STRP      For use with bayer breeze 2 meter for three times daily testing  DX 250.00   100 each   5   . INSULIN GLARGINE 100 UNIT/ML [place] SOLN   Subcutaneous   Inject 26 Units into the skin at bedtime.          Marland Kitchen LISINOPRIL-HYDROCHLOROTHIAZIDE 20-12.5 MG PO  TABS   Oral   Take 1 tablet by mouth daily.         Marland Kitchen METFORMIN HCL 1000 MG PO TABS   Oral   Take 1 tablet (1,000 mg total) by mouth 2 (two) times daily with a meal.   60 tablet   4   . OXYCODONE-ACETAMINOPHEN 5-325 MG PO TABS   Oral   Take 1 tablet by mouth once.   20 tablet   0   . SIMVASTATIN 10 MG PO TABS   Oral   Take 1 tablet (10 mg total) by mouth at bedtime.   30 tablet   3     BP 155/60  Pulse 69  Temp 97.7 F (36.5 C) (Oral)  Resp 18  Ht 5\' 5"  (1.651 m)  Wt 189 lb (85.73 kg)  BMI 31.45 kg/m2  SpO2 99%  Physical Exam  Nursing note and vitals reviewed. Constitutional: He is oriented to person, place, and time. He appears well-developed and well-nourished. No distress.  HENT:  Head: Normocephalic and atraumatic.  Eyes: EOM are normal. Pupils are equal, round, and reactive to light.  Neck: Normal range of motion. Neck supple. No tracheal deviation present.  Cardiovascular: Normal rate, regular rhythm and normal heart sounds.   No murmur heard. Pulmonary/Chest: Effort normal and breath sounds normal. No respiratory distress. He has no wheezes.  Abdominal: Soft. Bowel sounds are normal. He exhibits no distension. There is no tenderness.  Musculoskeletal: Normal range of motion. He exhibits tenderness. He exhibits no edema.       Multiple incision to left leg consistent with previous vascular procedures. Tenderness to the medial aspect of the mid left thigh. No palpable abnormalities. No erythema or warmth noted. Sensation and motor intact. DP palpable bilaterally.   Neurological: He is alert and oriented to person, place, and time.  Skin: Skin is warm and dry.  Psychiatric: He has a normal mood and affect. His behavior is normal.    ED Course  Procedures (including critical care time)  DIAGNOSTIC STUDIES: Oxygen Saturation is 99% on room air, normal by my interpretation.    COORDINATION OF CARE:  12:45-Discussed planned course of treatment with the  patient including pain medication and an ultrasound, who is agreeable at this time.   13:00-Medication Orders: Hydrocodone-acetaminophen (Norco/Vicodin) 5-325 mg per tablet 2 tablet-once.   Labs Reviewed -  No data to display No results found.   No diagnosis found.    MDM  The patient has a history of peripheral vascular disease and presents here with pain in the left leg.  The pulses are intact distally and there is no evidence of dvt.  I suspect this is musculoskeletal in nature.  To follow up with pcp if not improving.        I personally performed the services described in this documentation, which was scribed in my presence. The recorded information has been reviewed and is accurate.        Geoffery Lyons, MD 01/20/12 [phone]

## 2012-01-20 NOTE — ED Notes (Signed)
Pain lt upper leg and hip and lt  Arm,    Increased pain with motion,   Cough,  "wheeze in my chest"  Pain in chest when coughs.  Non productive cough

## 2012-01-26 ENCOUNTER — Encounter: Payer: Self-pay | Admitting: Family Medicine

## 2012-01-26 ENCOUNTER — Ambulatory Visit (INDEPENDENT_AMBULATORY_CARE_PROVIDER_SITE_OTHER): Payer: Medicare Other | Admitting: Family Medicine

## 2012-01-26 VITALS — BP 150/86 | HR 68 | Resp 15 | Ht 65.25 in | Wt 197.0 lb

## 2012-01-26 DIAGNOSIS — E785 Hyperlipidemia, unspecified: Secondary | ICD-10-CM

## 2012-01-26 DIAGNOSIS — E119 Type 2 diabetes mellitus without complications: Secondary | ICD-10-CM

## 2012-01-26 DIAGNOSIS — M79606 Pain in leg, unspecified: Secondary | ICD-10-CM

## 2012-01-26 DIAGNOSIS — I10 Essential (primary) hypertension: Secondary | ICD-10-CM

## 2012-01-26 DIAGNOSIS — I739 Peripheral vascular disease, unspecified: Secondary | ICD-10-CM

## 2012-01-26 DIAGNOSIS — E1149 Type 2 diabetes mellitus with other diabetic neurological complication: Secondary | ICD-10-CM

## 2012-01-26 DIAGNOSIS — M79609 Pain in unspecified limb: Secondary | ICD-10-CM

## 2012-01-26 DIAGNOSIS — J449 Chronic obstructive pulmonary disease, unspecified: Secondary | ICD-10-CM

## 2012-01-26 DIAGNOSIS — E1142 Type 2 diabetes mellitus with diabetic polyneuropathy: Secondary | ICD-10-CM

## 2012-01-26 MED ORDER — HYDROCODONE-ACETAMINOPHEN 10-500 MG PO TABS: 1.0000 | ORAL_TABLET | Freq: Two times a day (BID) | ORAL | Status: DC | PRN

## 2012-01-26 MED ORDER — LISINOPRIL-HYDROCHLOROTHIAZIDE 20-12.5 MG PO TABS: 2.0000 | ORAL_TABLET | Freq: Every day | ORAL | Status: DC

## 2012-01-26 MED ORDER — METFORMIN HCL 1000 MG PO TABS: 1000.0000 mg | ORAL_TABLET | Freq: Two times a day (BID) | ORAL | Status: DC

## 2012-01-26 NOTE — Progress Notes (Signed)
  Subjective:    Patient ID: [name], male    DOB: 03-30-1950, 61 y.o.   MRN: [id number]  HPI  Patient here to follow chronic medical problems. He was seen in the ED secondary to leg pain. He was discharged with no specific diagnosis. He did not reschedule the previous appointment with Dr. Ladona Ridgel for his pacemaker. He also had to reschedule his appointment with vein a vascular specialist for his legs.  Diabetes mellitus his fasting blood sugar this morning was 269. He's been averaging in the 200's on his meter he is taking Lantus 26 units he's not taking metformin he states this was not renewed   Review of Systems  GEN- denies fatigue, fever, weight loss,weakness, recent illness HEENT- denies eye drainage, change in vision, nasal discharge, CVS- denies chest pain, palpitations RESP- denies SOB, cough, wheeze ABD- denies N/V, change in stools, abd pain GU- denies dysuria, hematuria, dribbling, incontinence MSK- + joint pain, +muscle aches, injury Neuro- denies headache, dizziness, syncope, seizure activity      Objective:   Physical Exam GEN- NAD, alert and oriented x3 HEENT- PERRL, EOMI, non injected sclera, pink conjunctiva, MMM, oropharynx clear Neck- Supple, no LAD CVS- RRR, no murmur RESP-CTAB ABS-NABS,soft,NT,ND  MSK-spine non tender, neg SLR  Hip- normal IR/ER EXT- No edema Pulses- Radial, DP- 2+       Assessment & Plan:

## 2012-01-26 NOTE — Patient Instructions (Addendum)
Increase Lantus 30 units  Increase blood pressure pill- take 2 tablets at a time  Restart metformin twice a day  Get the labs done on Saturday New appointments to be made for Dr. Ladona Ridgel and Vein/vascular doctor New pain pill sent to be taken twice a day F/U 6 weeks

## 2012-01-29 DIAGNOSIS — M79606 Pain in leg, unspecified: Secondary | ICD-10-CM | POA: Insufficient documentation

## 2012-01-29 LAB — LIPID PANEL
HDL: 25 mg/dL — ABNORMAL LOW (ref >39)
LDL Cholesterol: 75 mg/dL (ref 0–99)
Triglycerides: 194 mg/dL — ABNORMAL HIGH (ref <150)
VLDL: 39 mg/dL (ref 0–40)

## 2012-01-29 LAB — BASIC METABOLIC PANEL
BUN: 15 mg/dL (ref 6–23)
CO2: 28 mEq/L (ref 19–32)
Calcium: 9.9 mg/dL (ref 8.4–10.5)
Glucose, Bld: 199 mg/dL — ABNORMAL HIGH (ref 70–99)
Sodium: 141 mEq/L (ref 135–145)

## 2012-01-29 LAB — ESTIMATED GFR: GFR, Est African American: 74 mL/min

## 2012-01-29 NOTE — Assessment & Plan Note (Signed)
Uncontrolled, restart metformin, increase lantus to 30 units [facility] to assist

## 2012-01-29 NOTE — Assessment & Plan Note (Signed)
Doing well currently, no change to meds, affording medications is a problem, we will see if [facility] can assist

## 2012-01-29 NOTE — Assessment & Plan Note (Signed)
Leg pain could be due to his severe PVD, possible referred OA from hip or back, decrease pain meds to hydrocodone BID,

## 2012-01-29 NOTE — Assessment & Plan Note (Signed)
Uncontrolled, increase zestoretic to 2 tablets daily

## 2012-01-29 NOTE — Assessment & Plan Note (Signed)
My office will reschedule with vein and vascular

## 2012-02-02 ENCOUNTER — Encounter: Payer: Self-pay | Admitting: Vascular Surgery

## 2012-02-03 ENCOUNTER — Encounter: Payer: Medicare Other | Admitting: Vascular Surgery

## 2012-02-23 ENCOUNTER — Encounter: Payer: Self-pay | Admitting: Vascular Surgery

## 2012-02-24 ENCOUNTER — Encounter: Payer: Medicare Other | Admitting: Vascular Surgery

## 2012-02-24 ENCOUNTER — Telehealth: Payer: Self-pay | Admitting: Family Medicine

## 2012-02-27 NOTE — Telephone Encounter (Signed)
noted 

## 2012-03-08 ENCOUNTER — Ambulatory Visit: Payer: Medicare Other | Admitting: Family Medicine

## 2012-03-26 ENCOUNTER — Encounter: Payer: Self-pay | Admitting: Family Medicine

## 2012-03-26 ENCOUNTER — Ambulatory Visit (INDEPENDENT_AMBULATORY_CARE_PROVIDER_SITE_OTHER): Payer: Medicare Other | Admitting: Family Medicine

## 2012-03-26 VITALS — BP 150/82 | HR 78 | Resp 18 | Ht 65.25 in | Wt 199.0 lb

## 2012-03-26 DIAGNOSIS — E119 Type 2 diabetes mellitus without complications: Secondary | ICD-10-CM

## 2012-03-26 DIAGNOSIS — I10 Essential (primary) hypertension: Secondary | ICD-10-CM

## 2012-03-26 DIAGNOSIS — Z23 Encounter for immunization: Secondary | ICD-10-CM

## 2012-03-26 LAB — GLUCOSE, POCT (MANUAL RESULT ENTRY): POC Glucose: 199 mg/dl — AB (ref 70–99)

## 2012-03-26 MED ORDER — AMLODIPINE BESYLATE 5 MG PO TABS: 5.0000 mg | ORAL_TABLET | Freq: Every day | ORAL | Status: DC

## 2012-03-26 NOTE — Progress Notes (Signed)
  Subjective:    Patient ID: [name], male    DOB: 18-Mar-1950, 62 y.o.   MRN: [id number]  HPI  Patient here to followup hypertension and diabetes mellitus, Lantus was increased to 30 units he has his meter today which shows various readings fasting from 150 to 200s and they're even to 400s in the evening time. No hypoglycemia he is taking metformin twice a day per report. He is taking 2 lisinopril HCTZ per report. He missed his appointment with the vascular surgeon states he cannot afford to go. He is being seen by [facility]  Review of Systems   GEN- denies fatigue, fever, weight loss,weakness, recent illness HEENT- denies eye drainage, change in vision, nasal discharge, CVS- denies chest pain, palpitations RESP- denies SOB, cough, wheeze Neuro- denies headache, dizziness, syncope, seizure activity      Objective:   Physical Exam GEN- NAD, alert and oriented x3 CVS- RRR, no murmur RESP-CTAB EXT- No edema Pulses- Radial, DP- 2+        Assessment & Plan:

## 2012-03-26 NOTE — Patient Instructions (Addendum)
Increase lantus to 35 units  Take metformin twice a day with food Add blood pressure -Norvasc  Write down the blood sugars  F/U 6 weeks

## 2012-03-27 NOTE — Assessment & Plan Note (Signed)
Uncontrolled, will add norvasc 5 mg to lisinopril HCTZ 40-25mg 

## 2012-03-27 NOTE — Assessment & Plan Note (Signed)
Uncontrolled, he is taking metformin now, increase lantus to 35 units, I spoke with his [facility] about the changes made, he has BP monitor, glucometer and she will get him a scale  Discussed diet again avoiding sweets,carbs

## 2012-05-03 ENCOUNTER — Other Ambulatory Visit: Payer: Self-pay

## 2012-05-03 ENCOUNTER — Telehealth: Payer: Self-pay | Admitting: Family Medicine

## 2012-05-03 MED ORDER — LISINOPRIL-HYDROCHLOROTHIAZIDE 20-12.5 MG PO TABS: 2.0000 | ORAL_TABLET | Freq: Every day | ORAL | Status: DC

## 2012-05-03 NOTE — Telephone Encounter (Signed)
Med reordered.

## 2012-05-07 ENCOUNTER — Telehealth: Payer: Self-pay | Admitting: Family Medicine

## 2012-05-07 ENCOUNTER — Ambulatory Visit: Payer: Medicare Other | Admitting: Family Medicine

## 2012-05-07 NOTE — Telephone Encounter (Signed)
Noted  

## 2012-05-21 ENCOUNTER — Encounter: Payer: Self-pay | Admitting: Family Medicine

## 2012-05-21 ENCOUNTER — Ambulatory Visit (INDEPENDENT_AMBULATORY_CARE_PROVIDER_SITE_OTHER): Payer: Medicare Other | Admitting: Family Medicine

## 2012-05-21 VITALS — BP 172/86 | HR 72 | Resp 18 | Ht 65.25 in | Wt 195.1 lb

## 2012-05-21 DIAGNOSIS — I10 Essential (primary) hypertension: Secondary | ICD-10-CM

## 2012-05-21 DIAGNOSIS — E119 Type 2 diabetes mellitus without complications: Secondary | ICD-10-CM

## 2012-05-21 DIAGNOSIS — IMO0001 Reserved for inherently not codable concepts without codable children: Secondary | ICD-10-CM

## 2012-05-21 MED ORDER — SIMVASTATIN 10 MG PO TABS: 10.0000 mg | ORAL_TABLET | Freq: Every day | ORAL | Status: DC

## 2012-05-21 MED ORDER — LISINOPRIL-HYDROCHLOROTHIAZIDE 20-12.5 MG PO TABS: 2.0000 | ORAL_TABLET | Freq: Every day | ORAL | Status: DC

## 2012-05-21 MED ORDER — AMLODIPINE BESYLATE 5 MG PO TABS: 5.0000 mg | ORAL_TABLET | Freq: Every day | ORAL | Status: DC

## 2012-05-21 MED ORDER — METFORMIN HCL 1000 MG PO TABS: 1000.0000 mg | ORAL_TABLET | Freq: Two times a day (BID) | ORAL | Status: DC

## 2012-05-21 MED ORDER — GABAPENTIN 300 MG PO CAPS: 300.0000 mg | ORAL_CAPSULE | Freq: Three times a day (TID) | ORAL | Status: DC

## 2012-05-21 NOTE — Assessment & Plan Note (Signed)
Elevated No meds, will have [facility] nurse check BP

## 2012-05-21 NOTE — Assessment & Plan Note (Signed)
Will send him for her A1c his last one was 9.9% his blood sugars are much improved his blood sugar in the office today is 121. Continue lantus 35 units and metformin He would benefit from podiatry but unable to afford visit and transportation is an issue Diabetic shoes to be ordered

## 2012-05-21 NOTE — Patient Instructions (Addendum)
Take your blood pressure medication Simvastatin is your cholesterol pill, take at bedtime Get the blood work drawn today [facility] nurse will check your blood pressure F/U 3 months

## 2012-05-21 NOTE — Progress Notes (Signed)
  Subjective:    Patient ID: [name], male    DOB: Nov 28, 1950, 62 y.o.   MRN: [id number]  HPI  Patient here to followup diabetes and hypertension this is an interim visit. Fortunately he did not take his blood pressure medications or any of his other pills today. He has been giving himself his insulin his blood sugar meter shows a 14 day average of 145. His fasting blood sugars have been 130 to 170 his last A1c was 9.9%. He needs a new prescription for diabetic shoes. [facility] nurse/coordinator [name] will see him tomorrow   Review of Systems   GEN- denies fatigue, fever, weight loss,weakness, recent illness HEENT- denies eye drainage, change in vision, nasal discharge, CVS- denies chest pain, palpitations RESP- denies SOB, cough, wheeze ABD- denies N/V, change in stools, abd pain GU- denies dysuria, hematuria, dribbling, incontinence MSK- denies joint pain, muscle aches, injury Neuro- denies headache, dizziness, syncope, seizure activity      Objective:   Physical Exam GEN- NAD, alert and oriented x3 CVS- RRR, no murmur RESP-CTAB EXT- No edema Pulses- Radial 2+ DP diminished        Assessment & Plan:

## 2012-05-22 ENCOUNTER — Telehealth: Payer: Self-pay

## 2012-05-22 LAB — BASIC METABOLIC PANEL
CO2: 29 mEq/L (ref 19–32)
Calcium: 10.2 mg/dL (ref 8.4–10.5)
Creat: 1.06 mg/dL (ref 0.50–1.35)
Glucose, Bld: 123 mg/dL — ABNORMAL HIGH (ref 70–99)

## 2012-05-22 LAB — HEMOGLOBIN A1C: Mean Plasma Glucose: 217 mg/dL — ABNORMAL HIGH (ref <117)

## 2012-05-22 NOTE — Telephone Encounter (Signed)
[facility] nurse called in BP reading  Before med 144/83  After med 2 hrs 110/78

## 2012-05-22 NOTE — Telephone Encounter (Signed)
Noted, normal range, no change to meds

## 2012-05-29 ENCOUNTER — Telehealth: Payer: Self-pay | Admitting: Family Medicine

## 2012-05-29 MED ORDER — BLOOD GLUCOSE TEST VI STRP: ORAL_STRIP | Status: DC

## 2012-05-29 NOTE — Telephone Encounter (Signed)
Sent to [facility]

## 2012-06-05 ENCOUNTER — Other Ambulatory Visit: Payer: Self-pay

## 2012-06-05 MED ORDER — SIMVASTATIN 10 MG PO TABS: 10.0000 mg | ORAL_TABLET | Freq: Every day | ORAL | Status: DC

## 2012-06-05 MED ORDER — AMLODIPINE BESYLATE 5 MG PO TABS: 5.0000 mg | ORAL_TABLET | Freq: Every day | ORAL | Status: DC

## 2012-06-05 MED ORDER — METFORMIN HCL 1000 MG PO TABS: 1000.0000 mg | ORAL_TABLET | Freq: Two times a day (BID) | ORAL | Status: DC

## 2012-06-05 MED ORDER — GABAPENTIN 300 MG PO CAPS: 300.0000 mg | ORAL_CAPSULE | Freq: Three times a day (TID) | ORAL | Status: DC

## 2012-06-05 MED ORDER — INSULIN GLARGINE 100 UNIT/ML ~~LOC~~ SOLN: 35.0000 [IU] | Freq: Every day | SUBCUTANEOUS | Status: DC

## 2012-06-05 MED ORDER — LISINOPRIL-HYDROCHLOROTHIAZIDE 20-12.5 MG PO TABS: 2.0000 | ORAL_TABLET | Freq: Every day | ORAL | Status: DC

## 2012-06-05 MED ORDER — BLOOD GLUCOSE TEST VI STRP: ORAL_STRIP | Status: DC

## 2012-08-14 ENCOUNTER — Ambulatory Visit: Payer: Self-pay | Admitting: Family Medicine

## 2012-08-15 ENCOUNTER — Other Ambulatory Visit: Payer: Self-pay | Admitting: Family Medicine

## 2012-08-15 MED ORDER — INSULIN GLARGINE 100 UNIT/ML ~~LOC~~ SOLN: 35.0000 [IU] | Freq: Every day | SUBCUTANEOUS | Status: DC

## 2012-08-15 NOTE — Telephone Encounter (Signed)
Med refilled.

## 2012-08-20 ENCOUNTER — Ambulatory Visit: Payer: Medicare Other | Admitting: Family Medicine

## 2012-09-12 ENCOUNTER — Other Ambulatory Visit: Payer: Self-pay | Admitting: Family Medicine

## 2012-09-13 NOTE — Telephone Encounter (Signed)
Med refilled.

## 2012-09-17 ENCOUNTER — Ambulatory Visit (INDEPENDENT_AMBULATORY_CARE_PROVIDER_SITE_OTHER): Payer: Medicare Other | Admitting: Family Medicine

## 2012-09-17 ENCOUNTER — Encounter: Payer: Self-pay | Admitting: Family Medicine

## 2012-09-17 ENCOUNTER — Telehealth: Payer: Self-pay | Admitting: Internal Medicine

## 2012-09-17 VITALS — BP 146/78 | HR 62 | Temp 97.5°F | Resp 18 | Ht 64.0 in | Wt 193.0 lb

## 2012-09-17 DIAGNOSIS — E785 Hyperlipidemia, unspecified: Secondary | ICD-10-CM

## 2012-09-17 DIAGNOSIS — R002 Palpitations: Secondary | ICD-10-CM | POA: Insufficient documentation

## 2012-09-17 DIAGNOSIS — E119 Type 2 diabetes mellitus without complications: Secondary | ICD-10-CM

## 2012-09-17 DIAGNOSIS — R9431 Abnormal electrocardiogram [ECG] [EKG]: Secondary | ICD-10-CM

## 2012-09-17 DIAGNOSIS — I10 Essential (primary) hypertension: Secondary | ICD-10-CM

## 2012-09-17 LAB — CBC WITH DIFFERENTIAL/PLATELET
Eosinophils Relative: 3 % (ref 0–5)
HCT: 31.5 % — ABNORMAL LOW (ref 39.0–52.0)
Hemoglobin: 10.4 g/dL — ABNORMAL LOW (ref 13.0–17.0)
Lymphocytes Relative: 34 % (ref 12–46)
MCHC: 33 g/dL (ref 30.0–36.0)
MCV: 82.2 fL (ref 78.0–100.0)
Monocytes Absolute: 0.4 10*3/uL (ref 0.1–1.0)
Monocytes Relative: 9 % (ref 3–12)
Neutro Abs: 2.5 10*3/uL (ref 1.7–7.7)
RDW: 13.4 % (ref 11.5–15.5)
WBC: 4.7 10*3/uL (ref 4.0–10.5)

## 2012-09-17 LAB — COMPREHENSIVE METABOLIC PANEL
ALT: 11 U/L (ref 0–53)
AST: 13 U/L (ref 0–37)
BUN: 13 mg/dL (ref 6–23)
Calcium: 9.7 mg/dL (ref 8.4–10.5)
Chloride: 105 mEq/L (ref 96–112)
Creat: 1.11 mg/dL (ref 0.50–1.35)
Total Bilirubin: 0.7 mg/dL (ref 0.3–1.2)

## 2012-09-17 LAB — TSH: TSH: 2.284 u[IU]/mL (ref 0.350–4.500)

## 2012-09-17 LAB — HEMOGLOBIN A1C, FINGERSTICK: Hgb A1C (fingerstick): 9.8 % — ABNORMAL HIGH (ref <5.7)

## 2012-09-17 MED ORDER — INSULIN GLARGINE 100 UNIT/ML SOLOSTAR PEN: PEN_INJECTOR | SUBCUTANEOUS | Status: DC

## 2012-09-17 MED ORDER — AMLODIPINE BESYLATE 10 MG PO TABS: 10.0000 mg | ORAL_TABLET | Freq: Every day | ORAL | Status: DC

## 2012-09-17 MED ORDER — METFORMIN HCL 1000 MG PO TABS: 1000.0000 mg | ORAL_TABLET | Freq: Two times a day (BID) | ORAL | Status: DC

## 2012-09-17 MED ORDER — INSULIN PEN NEEDLE 31G X 8 MM MISC: Status: DC

## 2012-09-17 MED ORDER — GABAPENTIN 300 MG PO CAPS: 300.0000 mg | ORAL_CAPSULE | Freq: Three times a day (TID) | ORAL | Status: DC

## 2012-09-17 MED ORDER — AMLODIPINE BESYLATE 5 MG PO TABS: 10.0000 mg | ORAL_TABLET | Freq: Every day | ORAL | Status: DC

## 2012-09-17 MED ORDER — SIMVASTATIN 10 MG PO TABS: 10.0000 mg | ORAL_TABLET | Freq: Every day | ORAL | Status: DC

## 2012-09-17 MED ORDER — LISINOPRIL-HYDROCHLOROTHIAZIDE 20-12.5 MG PO TABS: 2.0000 | ORAL_TABLET | Freq: Every day | ORAL | Status: DC

## 2012-09-17 NOTE — Assessment & Plan Note (Signed)
Diabetes is uncontrolled his A1c is worsened. I'll increase his insulin to 40 units. Keeping his medications on a regular basis has been up area to his care.  Continue metformin

## 2012-09-17 NOTE — Assessment & Plan Note (Signed)
Blood pressure is above goal. Is currently on ACE inhibitor. I will increase his Norvasc to 10 mg. I will await cardiology tomorrow before starting any rate controlling medications

## 2012-09-17 NOTE — Patient Instructions (Addendum)
Increase lantus to 40 units  Referral to heart doctor Take 2 of the norvasc, then pick up new dose F/U 4 weeks for diabetes

## 2012-09-17 NOTE — Assessment & Plan Note (Signed)
Listening to his cardiac exam he appears to have an arrhythmia however does not show up on the EKG, as he is being paced. He has some nonspecific T wave changes there was some question of ST depression as well as the anterior leads that they compared this to his previous EKGs there appears to have some depression in those leads as well. At any rate with his history of sick sinus syndrome and the fact that his pacemaker has not been checked in greater than 2 years he needs followup this week. His cardiology appointment scheduled for tomorrow.  I will check TSH metabolic panel and CBC today

## 2012-09-17 NOTE — Progress Notes (Signed)
  Subjective:    Patient ID: [name], male    DOB: [birth year], 62 y.o.   MRN: [id number]  HPI  Pt here to f/u chronic medical problem Diabetes mellitus- he has his meter with him today his 14 day average is 174. His fasting this morning was 174 yesterday 147. He's been taking Lantus 36 units he denies any hypoglycemia Coronary artery disease- he's had a few episodes of chest pain over the past couple weeks. He has a pacemaker he still not seen pacemaker clinic. He states he feels like there is some palpitations or fluttering in his chest. He denies shortness of breath nausea vomiting associated. He did not seek any care during the episode which lasted a few minutes. Medications reviewed  Review of Systems   GEN- denies fatigue, fever, weight loss,weakness, recent illness HEENT- denies eye drainage, change in vision, nasal discharge, CVS- denies chest pain,+ palpitations RESP- denies SOB, cough, wheeze ABD- denies N/V, change in stools, abd pain GU- denies dysuria, hematuria, dribbling, incontinence MSK- denies joint pain, muscle aches, injury Neuro- denies headache, dizziness, syncope, seizure activity      Objective:   Physical Exam GEN- NAD, alert and oriented x3 HEENT- PERRL, EOMI, non injected sclera, pink conjunctiva, MMM, oropharynx clear Neck- Supple,  CVS- irregular rhythm , normal rate, no murmur RESP-CTAB EXT- No edema Pulses- Radial, DP- 2+  EKG- Compared to EKG Oct 2013, Inverted t waves, pacer spikes, mild ST depression in anterior leads,      Assessment & Plan:

## 2012-09-17 NOTE — Telephone Encounter (Signed)
New problem   Sandy/Dr [facility] stated Dr Jeanice Lim wanted pt to be seen this week with Dr Ladona Ridgel..Pt had pacer implanted by him in 2010 and is now having palpitation. Notes are in Epic and EKG will be in Epic in 1hr. Andrey Campanile would like to speak with nurse once dr have a chance to view records in epic to see if an sooner appt can be made.

## 2012-09-17 NOTE — Telephone Encounter (Signed)
Patient will see Sharrell Ku tomorrow.  Dr Ladona Ridgel is also in the office

## 2012-09-17 NOTE — Assessment & Plan Note (Signed)
Per above regarding his palpitations

## 2012-09-18 ENCOUNTER — Ambulatory Visit (INDEPENDENT_AMBULATORY_CARE_PROVIDER_SITE_OTHER): Payer: Medicare Other | Admitting: Cardiology

## 2012-09-18 ENCOUNTER — Encounter: Payer: Self-pay | Admitting: Cardiology

## 2012-09-18 VITALS — BP 147/64 | HR 60 | Ht 64.0 in | Wt 197.0 lb

## 2012-09-18 DIAGNOSIS — I493 Ventricular premature depolarization: Secondary | ICD-10-CM

## 2012-09-18 DIAGNOSIS — I472 Ventricular tachycardia, unspecified: Secondary | ICD-10-CM

## 2012-09-18 DIAGNOSIS — R002 Palpitations: Secondary | ICD-10-CM

## 2012-09-18 DIAGNOSIS — Z950 Presence of cardiac pacemaker: Secondary | ICD-10-CM

## 2012-09-18 DIAGNOSIS — I495 Sick sinus syndrome: Secondary | ICD-10-CM

## 2012-09-18 DIAGNOSIS — I2510 Atherosclerotic heart disease of native coronary artery without angina pectoris: Secondary | ICD-10-CM

## 2012-09-18 DIAGNOSIS — I4949 Other premature depolarization: Secondary | ICD-10-CM

## 2012-09-18 LAB — PACEMAKER DEVICE OBSERVATION
AL IMPEDENCE PM: 310 Ohm
ATRIAL PACING PM: 88
RV LEAD IMPEDENCE PM: 550 Ohm
VENTRICULAR PACING PM: 5.2

## 2012-09-18 NOTE — Patient Instructions (Addendum)
Your physician has requested that you have an echocardiogram. Echocardiography is a painless test that uses sound waves to create images of your heart. It provides your doctor with information about the size and shape of your heart and how well your heart's chambers and valves are working. This procedure takes approximately one hour. There are no restrictions for this procedure.  Your physician recommends that you schedule a follow-up appointment in: 10-23-2012 at 2:30pm with [facility]  Your physician has recommended you make the following change in your medication:   START METOPROLOL TART. 25 MG TWICE A DAY (TWELVE HOURS APART)  Your physician recommends that you continue on your current medications as directed. Please refer to the Current Medication list given to you today.

## 2012-09-18 NOTE — Progress Notes (Signed)
ELECTROPHYSIOLOGY OFFICE NOTE  Patient ID: [name] MRN: [id number], DOB/AGE: 1950-08-03   Date of Visit: 09/18/2012  Primary Physician: Milinda Antis, MD Primary Cardiologist / EP: Dietrich Pates, MD / Ladona Ridgel, MD Reason for Visit: EP/device follow-up  History of Present Illness  [name] is a 62 y.o. male with CAD s/p CABG, PVD, HTN, DM and sinus node dysfunction s/p PPM implant who presents for EP/device follow-up at the request of Dr. Jeanice Lim to reestablish pacemaker care after having been lost to followup since August 2009.   He reports vague chest discomfort and left arm aching that occurs unpredictably and infrequently. This is located in the lower mid and left chest, is poorly characterized, mild and resolves spontaneously in a short period of time. He remains fairly active including push mowing his lawn without any exercise related dyspnea or chest discomfort. He denies exertional chest pain or shortness of breath. He report occasional "fluttering" palpitations but denies dizziness, near syncope or syncope. He denies LE swelling, orthopnea, PND or recent weight gain.   Past Medical History Past Medical History  Diagnosis Date  . Arteriosclerotic cardiovascular disease (ASCVD)     CABG in 2003 for 3 VD; 07/2007: Stress nuclear-inferior infarction without ischemia; normal EF by echo in 06/2005  . Diabetes mellitus type II   . Hyperlipidemia   . Degenerative joint disease     Left knee  . Cerebrovascular disease 2003    Carotid ultrasound in 2010: Patent right CEA site, 60-79% left internal carotid artery  . Peripheral vascular disease 2007    aortobifemoral BPG-2007  . Anemia   . Peripheral neuropathy   . Hypertension   . Sick sinus syndrome 1996    permanent pacemaker-1996; generator change in 2010  . Chronic obstructive pulmonary disease   . Obesity   . Obstructive sleep apnea   . Benign prostatic hypertrophy   . Gastroesophageal reflux disease   . Tobacco  abuse, in remission     50 pack years total consumption; Quit in 2007    Past Surgical History Past Surgical History  Procedure Laterality Date  . Coronary artery bypass graft  2003    three-vessel disease  . Iliac artery stent      Percutaneous intervention-right external iliac; unclear whether stent was placed  . Pacemaker insertion  1996    generator change in 2010  . Carotid endarterectomy  2003    Right  . Pacemaker insertion      Generator replaced in 04/2008  . Aorta - bilateral femoral artery bypass graft  2007  . Femoral-popliteal bypass graft  1998    bilateral  . Colonoscopy  2012    Negative screening study.    Allergies/Intolerances No Known Allergies  Current Home Medications Current Outpatient Prescriptions  Medication Sig Dispense Refill  . acetaminophen (TYLENOL) 500 MG tablet Take 1,000 mg by mouth every 6 (six) hours as needed. Pain.      Marland Kitchen albuterol (PROVENTIL HFA;VENTOLIN HFA) 108 (90 BASE) MCG/ACT inhaler Inhale 2 puffs into the lungs every 4 (four) hours as needed.      Marland Kitchen amLODipine (NORVASC) 10 MG tablet Take 1 tablet (10 mg total) by mouth daily.  90 tablet  1  . aspirin 81 MG tablet Take 81 mg by mouth daily.        Marland Kitchen gabapentin (NEURONTIN) 300 MG capsule Take 1 capsule (300 mg total) by mouth 3 (three) times daily.  270 capsule  1  . Glucose Blood (BLOOD GLUCOSE  TEST STRIPS) STRP For use with bayer breeze 2 meter for three times daily testing  DX 250.00  300 each  1  . Insulin Glargine (LANTUS SOLOSTAR) 100 UNIT/ML SOPN Take 40 units at bedtime for diabetes  15 mL  6  . Insulin Pen Needle (B-D ULTRAFINE III SHORT PEN) 31G X 8 MM MISC Apply to lantus solastar pen daily as directed  100 each  6  . lisinopril-hydrochlorothiazide (PRINZIDE,ZESTORETIC) 20-12.5 MG per tablet Take 2 tablets by mouth daily.  180 tablet  1  . metFORMIN (GLUCOPHAGE) 1000 MG tablet Take 1 tablet (1,000 mg total) by mouth 2 (two) times daily with a meal.  180 tablet  1  .  simvastatin (ZOCOR) 10 MG tablet Take 1 tablet (10 mg total) by mouth at bedtime.  90 tablet  1   No current facility-administered medications for this visit.   Social History Social History  . Marital Status: Married   Occupational History  . 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 Smoking status: Former Smoker -- 2.00 packs/day for 25 years    Types: Cigarettes  . Smokeless tobacco: Former Neurosurgeon    Quit date: 02/16/1996  . Alcohol Use: No  . Drug Use: No  . Sexually Active: Not on file   Review of Systems General: No chills, fever, night sweats or weight changes Cardiovascular: No exertional chest pain, dyspnea on exertion, edema, orthopnea, paroxysmal nocturnal dyspnea Dermatological: No rash, lesions or masses Respiratory: No cough, dyspnea Urologic: No hematuria, dysuria Abdominal: No nausea, vomiting, diarrhea, bright red blood per rectum, melena, or hematemesis Neurologic: No visual changes, weakness, changes in mental status All other systems reviewed and are otherwise negative except as noted above.  Physical Exam Vitals: Blood pressure 147/64, pulse 60, height 5\' 4"  (1.626 m), weight 197 lb (89.359 kg).  General: Well developed, well appearing 62 y.o. male in no acute distress. HEENT: Normocephalic, atraumatic. EOMs intact. Sclera nonicteric. Oropharynx clear.  Neck: Supple without bruits. No JVD. Lungs: Respirations regular and unlabored, CTA bilaterally. No wheezes, rales or rhonchi. Heart: RRR. S1, S2 present. No murmurs, rub, S3 or S4. Abdomen: Soft, non-distended. Extremities: No clubbing, cyanosis or edema. PT/Radials 2+ and equal bilaterally. Psych: Normal affect. Neuro: Alert and oriented X 3. Moves all extremities spontaneously.   Diagnostics 12-lead ECG from PCP's office reviewed - A paced V sensed; no ST-T wave abnormalities Device interrogation today - PPM lost to follow-up since 2009. Normal device  function. Thresholds, sensing, impedances consistent with previous measurements. Device programmed to maximize longevity. 43,309 mode switch episodes, <1% of time, EGMs show noise. 24 high ventricular rates noted since Jan 2011, longest 10 seconds, EGMs reviewed and consistent with NSVT. Device programmed at appropriate safety margins. Histogram distribution appropriate for patient activity level. Device programmed to optimize intrinsic conduction. Estimated longevity 5.8 - 6.5 years.   Assessment and Plan 1. Symptomatic PVCs, NSVT - check echo - start metoprolol 25 mg PO BID - return to clinic in 4 weeks 2. Sinus node dysfunction s/p PPM implant - has been lost to follow-up x 5 years - normal device function by interrogation today - no programming changes made - continue routine PPM follow-up in device clinic every 6 months 3. CAD - stable without anginal symptoms - continue medical therapy  This plan of care was formulated with Dr. Ladona Ridgel who has reviewed Mr. Noseworthy' chart and device interrogation. Signed, Rick Duff, PA-C 09/18/2012,  3:32 PM

## 2012-09-27 ENCOUNTER — Other Ambulatory Visit (HOSPITAL_COMMUNITY): Payer: Medicare Other

## 2012-10-01 ENCOUNTER — Telehealth: Payer: Self-pay | Admitting: Family Medicine

## 2012-10-01 ENCOUNTER — Ambulatory Visit: Payer: Medicare Other | Admitting: Adult Health

## 2012-10-01 MED ORDER — "PEN NEEDLES 5/16"" 31G X 8 MM MISC": Status: DC

## 2012-10-01 NOTE — Telephone Encounter (Signed)
Insulin Pen Needles

## 2012-10-01 NOTE — Telephone Encounter (Signed)
Needles sent to [facility]

## 2012-10-04 ENCOUNTER — Encounter: Payer: Self-pay | Admitting: Adult Health

## 2012-10-04 ENCOUNTER — Ambulatory Visit (INDEPENDENT_AMBULATORY_CARE_PROVIDER_SITE_OTHER): Payer: Medicare Other | Admitting: Adult Health

## 2012-10-04 VITALS — BP 150/88 | HR 66 | Ht 66.0 in | Wt 190.0 lb

## 2012-10-04 DIAGNOSIS — I2510 Atherosclerotic heart disease of native coronary artery without angina pectoris: Secondary | ICD-10-CM

## 2012-10-04 DIAGNOSIS — I10 Essential (primary) hypertension: Secondary | ICD-10-CM

## 2012-10-04 DIAGNOSIS — I7090 Unspecified atherosclerosis: Secondary | ICD-10-CM

## 2012-10-04 DIAGNOSIS — I739 Peripheral vascular disease, unspecified: Secondary | ICD-10-CM

## 2012-10-04 DIAGNOSIS — I679 Cerebrovascular disease, unspecified: Secondary | ICD-10-CM

## 2012-10-04 DIAGNOSIS — I495 Sick sinus syndrome: Secondary | ICD-10-CM

## 2012-10-04 DIAGNOSIS — R002 Palpitations: Secondary | ICD-10-CM

## 2012-10-04 MED ORDER — METOPROLOL TARTRATE 25 MG PO TABS: 25.0000 mg | ORAL_TABLET | Freq: Two times a day (BID) | ORAL | Status: DC

## 2012-10-04 NOTE — Patient Instructions (Signed)
Your physician recommends that you schedule a follow-up appointment in: 6 MONTHS You will receive a reminder letter two months in advance reminding you to call and schedule your appointment. If you don't receive this letter, please contact our office.  Your physician recommends that you continue on your current medications as directed. Please refer to the Current Medication list given to you today.   

## 2012-10-04 NOTE — Progress Notes (Deleted)
Name: ADONAY SCHEIER    DOB: Aug 23, 1950  Age: 62 y.o.  MR#: [id number]       PCP:  Milinda Antis, MD      Insurance: Payor: [employer]vertising [employer] MEDICARE / Plan: AARP MEDICARE COMPLETE / Product Type: *No Product type* /   CC:   Chest pain  VS Filed Vitals:   10/04/12 1451  BP: 150/88  Pulse: 66  Height: 5\' 6"  (1.676 m)  Weight: 190 lb (86.183 kg)    Weights Current Weight  10/04/12 190 lb (86.183 kg)  09/18/12 197 lb (89.359 kg)  09/17/12 193 lb (87.544 kg)    Blood Pressure  BP Readings from Last 3 Encounters:  10/04/12 150/88  09/18/12 147/64  09/17/12 146/78     Admit date:  (Not on file) Last encounter with RMR:  Visit date not found   Allergy Review of patient's allergies indicates no known allergies.  Current Outpatient Prescriptions  Medication Sig Dispense Refill  . acetaminophen (TYLENOL) 500 MG tablet Take 1,000 mg by mouth every 6 (six) hours as needed. Pain.      Marland Kitchen albuterol (PROVENTIL HFA;VENTOLIN HFA) 108 (90 BASE) MCG/ACT inhaler Inhale 2 puffs into the lungs every 4 (four) hours as needed.      Marland Kitchen amLODipine (NORVASC) 10 MG tablet Take 1 tablet (10 mg total) by mouth daily.  90 tablet  1  . aspirin 81 MG tablet Take 81 mg by mouth daily.        Marland Kitchen gabapentin (NEURONTIN) 300 MG capsule Take 1 capsule (300 mg total) by mouth 3 (three) times daily.  270 capsule  1  . Glucose Blood (BLOOD GLUCOSE TEST STRIPS) STRP For use with bayer breeze 2 meter for three times daily testing  DX 250.00  300 each  1  . Insulin Glargine (LANTUS SOLOSTAR) 100 UNIT/ML SOPN Take 40 units at bedtime for diabetes  15 mL  6  . Insulin Pen Needle (PEN NEEDLES 31GX5/16") 31G X 8 MM MISC As directed  100 each  3  . lisinopril-hydrochlorothiazide (PRINZIDE,ZESTORETIC) 20-12.5 MG per tablet Take 2 tablets by mouth daily.  180 tablet  1  . metFORMIN (GLUCOPHAGE) 1000 MG tablet Take 1 tablet (1,000 mg total) by mouth 2 (two) times daily with a meal.  180 tablet  1  . simvastatin (ZOCOR) 10  MG tablet Take 1 tablet (10 mg total) by mouth at bedtime.  90 tablet  1  . metoprolol tartrate (LOPRESSOR) 25 MG tablet Take 25 mg by mouth 2 (two) times daily.       No current facility-administered medications for this visit.    Discontinued Meds:   There are no discontinued medications.  Patient Active Problem List   Diagnosis Date Noted  . Nonspecific abnormal electrocardiogram (ECG) (EKG) 09/17/2012  . Palpitations 09/17/2012  . Leg pain 01/29/2012  . COPD exacerbation 12/05/2011  . Hematuria 12/05/2011  . Pacemaker-St.Jude 11/16/2011  . Arteriosclerotic cardiovascular disease (ASCVD)   . Diabetes mellitus, type 2   . Cerebrovascular disease   . Peripheral vascular disease   . Sick sinus syndrome   . Chronic obstructive pulmonary disease   . Obesity   . Obstructive sleep apnea   . Tobacco abuse, in remission   . Anemia   . HYPERLIPIDEMIA 04/20/2006  . PERIPHERAL NEUROPATHY 01/18/2006  . HYPERTENSION 01/18/2006    LABS    Component Value Date/Time   NA 139 09/17/2012 0939   NA 139 05/21/2012 1354   NA 141 01/26/2012 1519  K 4.1 09/17/2012 0939   K 4.2 05/21/2012 1354   K 4.4 01/26/2012 1519   CL 105 09/17/2012 0939   CL 104 05/21/2012 1354   CL 104 01/26/2012 1519   CO2 27 09/17/2012 0939   CO2 29 05/21/2012 1354   CO2 28 01/26/2012 1519   GLUCOSE 176* 09/17/2012 0939   GLUCOSE 123* 05/21/2012 1354   GLUCOSE 199* 01/26/2012 1519   BUN 13 09/17/2012 0939   BUN 13 05/21/2012 1354   BUN 15 01/26/2012 1519   CREATININE 1.11 09/17/2012 0939   CREATININE 1.06 05/21/2012 1354   CREATININE 1.21 01/26/2012 1519   CREATININE 1.20 12/09/2011 2126   CREATININE 0.97 10/22/2009 1512   CREATININE 1.40 05/06/2008 2123   CALCIUM 9.7 09/17/2012 0939   CALCIUM 10.2 05/21/2012 1354   CALCIUM 9.9 01/26/2012 1519   GFRNONAA >60 10/22/2009 1512   GFRAA  Value: >60        The eGFR has been calculated using the MDRD equation. This calculation has not been validated in all clinical situations. eGFR's  persistently <60 mL/min signify possible Chronic Kidney Disease. 10/22/2009 1512   CMP     Component Value Date/Time   NA 139 09/17/2012 0939   K 4.1 09/17/2012 0939   CL 105 09/17/2012 0939   CO2 27 09/17/2012 0939   GLUCOSE 176* 09/17/2012 0939   BUN 13 09/17/2012 0939   CREATININE 1.11 09/17/2012 0939   CREATININE 1.20 12/09/2011 2126   CALCIUM 9.7 09/17/2012 0939   PROT 6.4 09/17/2012 0939   ALBUMIN 4.0 09/17/2012 0939   AST 13 09/17/2012 0939   ALT 11 09/17/2012 0939   ALKPHOS 39 09/17/2012 0939   BILITOT 0.7 09/17/2012 0939   GFRNONAA >60 10/22/2009 1512   GFRAA  Value: >60        The eGFR has been calculated using the MDRD equation. This calculation has not been validated in all clinical situations. eGFR's persistently <60 mL/min signify possible Chronic Kidney Disease. 10/22/2009 1512       Component Value Date/Time   WBC 4.7 09/17/2012 0939   WBC 5.9 05/20/2011 1413   WBC 4.6 10/22/2009 1512   HGB 10.4* 09/17/2012 0939   HGB 12.2* 12/09/2011 2126   HGB 12.0* 05/20/2011 1413   HCT 31.5* 09/17/2012 0939   HCT 36.0* 12/09/2011 2126   HCT 37.1* 05/20/2011 1413   MCV 82.2 09/17/2012 0939   MCV 84.7 05/20/2011 1413   MCV 84.0 10/22/2009 1512    Lipid Panel     Component Value Date/Time   CHOL 139 01/26/2012 1519   TRIG 194* 01/26/2012 1519   HDL 25* 01/26/2012 1519   CHOLHDL 5.6 01/26/2012 1519   VLDL 39 01/26/2012 1519   LDLCALC 75 01/26/2012 1519    ABG    Component Value Date/Time   TCO2 26 12/09/2011 2126     Lab Results  Component Value Date   TSH 2.284 09/17/2012   BNP (last 3 results) No results found for this basename: PROBNP,  in the last 8760 hours Cardiac Panel (last 3 results) No results found for this basename: CKTOTAL, CKMB, TROPONINI, RELINDX,  in the last 72 hours  Iron/TIBC/Ferritin    Component Value Date/Time   IRON 90 11/25/2006 0510   TIBC 304 11/25/2006 0510   FERRITIN 58 11/25/2006 0510     EKG Orders placed in visit on 12/26/11  . EKG     Prior Assessment and  Plan Problem List as of 10/04/2012     Cardiovascular  and Mediastinum   HYPERTENSION   Last Assessment & Plan   09/17/2012 Office Visit Written 09/17/2012  9:03 PM by Salley Scarlet, MD     Blood pressure is above goal. Is currently on ACE inhibitor. I will increase his Norvasc to 10 mg. I will await cardiology tomorrow before starting any rate controlling medications    Arteriosclerotic cardiovascular disease (ASCVD)   Last Assessment & Plan   12/05/2011 Office Visit Written 12/05/2011 10:08 PM by Salley Scarlet, MD     Occasional chest pain, EKG reviewed with cardiology, no change to meds, given red flags on ASA    Cerebrovascular disease   Last Assessment & Plan   10/24/2011 Office Visit Written 10/24/2011  1:20 PM by Kathlen Brunswick, MD     Patient had moderately severe left ICA disease in 2010; repeat imaging will be performed.    Peripheral vascular disease   Last Assessment & Plan   01/26/2012 Office Visit Written 01/29/2012  7:01 PM by Salley Scarlet, MD     My office will reschedule with vein and vascular    Sick sinus syndrome   Last Assessment & Plan   10/24/2011 Office Visit Written 10/24/2011  1:26 PM by Kathlen Brunswick, MD     Patient will be re-enrolled in [facility].      Respiratory   Chronic obstructive pulmonary disease   Last Assessment & Plan   01/26/2012 Office Visit Written 01/29/2012  6:58 PM by Salley Scarlet, MD     Doing well currently, no change to meds, affording medications is a problem, we will see if [facility] can assist    Obstructive sleep apnea   COPD exacerbation   Last Assessment & Plan   12/05/2011 Office Visit Written 12/05/2011 10:07 PM by Salley Scarlet, MD     Inhaler and antibiotics      Endocrine   Diabetes mellitus, type 2   Last Assessment & Plan   09/17/2012 Office Visit Written 09/17/2012  9:03 PM by Salley Scarlet, MD     Diabetes is uncontrolled his A1c is worsened. I'll increase his insulin to 40 units. Keeping his  medications on a regular basis has been up area to his care.  Continue metformin      Nervous and Auditory   PERIPHERAL NEUROPATHY   Last Assessment & Plan   09/20/2011 Office Visit Written 09/20/2011  4:59 PM by Salley Scarlet, MD     He was not aware he had gabapentin and has only been taking BID, increase to TID as prescribed, f/u 1 week for lab review      Other   HYPERLIPIDEMIA   Last Assessment & Plan   10/24/2011 Office Visit Written 10/24/2011  1:22 PM by Kathlen Brunswick, MD     Lipid profile is at goal with very low-dose statin therapy, which will be continued.    Obesity   Tobacco abuse, in remission   Anemia   Last Assessment & Plan   10/24/2011 Office Visit Written 10/24/2011  1:19 PM by Kathlen Brunswick, MD     Minimal anemia.  Dr. Jeanice Lim may wish to perform iron studies and stool Hemoccult testing if not previously undertaken.    Pacemaker-St.Jude   Hematuria   Last Assessment & Plan   12/05/2011 Office Visit Written 12/05/2011 10:09 PM by Salley Scarlet, MD     Small amount of blood noted in urine, no sign of infection, will recheck at  next visit, he has appt with cardiology and Vascular surgery, will complete one task at a time as he can become very confused with his appt etc, he may need urology referral    Leg pain   Last Assessment & Plan   01/26/2012 Office Visit Written 01/29/2012  7:03 PM by Salley Scarlet, MD     Leg pain could be due to his severe PVD, possible referred OA from hip or back, decrease pain meds to hydrocodone BID,    Nonspecific abnormal electrocardiogram (ECG) (EKG)   Last Assessment & Plan   09/17/2012 Office Visit Written 09/17/2012  9:04 PM by Salley Scarlet, MD     Per above regarding his palpitations    Palpitations   Last Assessment & Plan   09/17/2012 Office Visit Written 09/17/2012  9:02 PM by Salley Scarlet, MD     Listening to his cardiac exam he appears to have an arrhythmia however does not show up on the EKG, as he is being  paced. He has some nonspecific T wave changes there was some question of ST depression as well as the anterior leads that they compared this to his previous EKGs there appears to have some depression in those leads as well. At any rate with his history of sick sinus syndrome and the fact that his pacemaker has not been checked in greater than 2 years he needs followup this week. His cardiology appointment scheduled for tomorrow.  I will check TSH metabolic panel and CBC today        Imaging: No results found.

## 2012-10-04 NOTE — Assessment & Plan Note (Signed)
Continued risk management for now. Will not repeat stress test or invasive testing at this time. See him again in 6 months.

## 2012-10-04 NOTE — Assessment & Plan Note (Signed)
He continues to have some chest discomfort which is fleeting, and palpitations. He was started on low dose metoprolol but has not yet taken it. I have advised him to go by and get medications. Will evaluate if he has increased wheezing or bronchospams with known history of COPD. He is otherwise to continue current medications.

## 2012-10-04 NOTE — Assessment & Plan Note (Signed)
BP slightly elevated today. Will re-evaluate BP once metoprolol has been taken. Otherwise will continue antihypertensive regimen as directed.

## 2012-10-04 NOTE — Progress Notes (Signed)
HPI: [name] is a 62 y/o patient of Dr. Dietrich [name] and Dr. Lewayne [name] we are following for ongoing assessment and treatment of CAD, s/p CABG, PVD, Hypertension, Sinus node dysfunction s/p PPM. Was last seen by Collin Duff, PA for EP on 09/24/2012, with addition of metoprolol 25 mg BID for recurrent angina and fluttering. He has not filled the Rx yet. He comes today without new complaints. He is otherwise medically compliant.   No Known Allergies  Current Outpatient Prescriptions  Medication Sig Dispense Refill  . acetaminophen (TYLENOL) 500 MG tablet Take 1,000 mg by mouth every 6 (six) hours as needed. Pain.      Marland Kitchen albuterol (PROVENTIL HFA;VENTOLIN HFA) 108 (90 BASE) MCG/ACT inhaler Inhale 2 puffs into the lungs every 4 (four) hours as needed.      Marland Kitchen amLODipine (NORVASC) 10 MG tablet Take 1 tablet (10 mg total) by mouth daily.  90 tablet  1  . aspirin 81 MG tablet Take 81 mg by mouth daily.        Marland Kitchen gabapentin (NEURONTIN) 300 MG capsule Take 1 capsule (300 mg total) by mouth 3 (three) times daily.  270 capsule  1  . Glucose Blood (BLOOD GLUCOSE TEST STRIPS) STRP For use with bayer breeze 2 meter for three times daily testing  DX 250.00  300 each  1  . Insulin Glargine (LANTUS SOLOSTAR) 100 UNIT/ML SOPN Take 40 units at bedtime for diabetes  15 mL  6  . Insulin Pen Needle (PEN NEEDLES 31GX5/16") 31G X 8 MM MISC As directed  100 each  3  . lisinopril-hydrochlorothiazide (PRINZIDE,ZESTORETIC) 20-12.5 MG per tablet Take 2 tablets by mouth daily.  180 tablet  1  . metFORMIN (GLUCOPHAGE) 1000 MG tablet Take 1 tablet (1,000 mg total) by mouth 2 (two) times daily with a meal.  180 tablet  1  . simvastatin (ZOCOR) 10 MG tablet Take 1 tablet (10 mg total) by mouth at bedtime.  90 tablet  1  . metoprolol tartrate (LOPRESSOR) 25 MG tablet Take 1 tablet (25 mg total) by mouth 2 (two) times daily.  60 tablet  6   No current facility-administered medications for this visit.    Past Medical  History  Diagnosis Date  . Arteriosclerotic cardiovascular disease (ASCVD)     CABG in 2003 for 3 VD; 07/2007: Stress nuclear-inferior infarction without ischemia; normal EF by echo in 06/2005  . Diabetes mellitus type II   . Hyperlipidemia   . Degenerative joint disease     Left knee  . Cerebrovascular disease 2003    Carotid ultrasound in 2010: Patent right CEA site, 60-79% left internal carotid artery  . Peripheral vascular disease 2007    aortobifemoral BPG-2007  . Anemia   . Peripheral neuropathy   . Hypertension   . Sick sinus syndrome 1996    permanent pacemaker-1996; generator change in 2010  . Chronic obstructive pulmonary disease   . Obesity   . Obstructive sleep apnea   . Benign prostatic hypertrophy   . Gastroesophageal reflux disease   . Tobacco abuse, in remission     50 pack years total consumption; Quit in 2007    Past Surgical History  Procedure Laterality Date  . Coronary artery bypass graft  2003    three-vessel disease  . Iliac artery stent      Percutaneous intervention-right external iliac; unclear whether stent was placed  . Pacemaker insertion  1996    generator change in 2010  . Carotid  endarterectomy  2003    Right  . Pacemaker insertion      Generator replaced in 04/2008  . Aorta - bilateral femoral artery bypass graft  2007  . Femoral-popliteal bypass graft  1998    bilateral  . Colonoscopy  2012    Negative screening study.    JXB:JYNWGN of systems complete and found to be negative unless listed above  PHYSICAL EXAM BP 150/88  Pulse 66  Ht 5\' 6"  (1.676 m)  Wt 190 lb (86.183 kg)  BMI 30.68 kg/m2 General: Well developed, well nourished, in no acute distress Head: Eyes PERRLA, No xanthomas.   Normal cephalic and atramatic  Lungs: Bilateral crackles without wheezes or coughing. Heart: HRRR S1 S2, without MRG.  Pulses are 2+ & equal.            No carotid bruit. No JVD.   Abdomen: Bowel sounds are positive, abdomen soft and non-tender  without masses or                  Hernia's noted. Msk:  Back normal, normal gait. Normal strength and tone for age. Extremities: No clubbing, cyanosis or edema. Laceration on the inner aspect of the right lower leg. Does not appear infected. DP +1 Neuro: Alert and oriented X 3. Psych:  Good affect, responds appropriately  EKG: Paced rhythm. Rate of 70 bpm  ASSESSMENT AND PLAN

## 2012-10-16 ENCOUNTER — Ambulatory Visit: Payer: Medicare Other | Admitting: Family Medicine

## 2012-10-18 ENCOUNTER — Other Ambulatory Visit (HOSPITAL_COMMUNITY): Payer: Medicare Other

## 2012-10-19 ENCOUNTER — Encounter: Payer: Self-pay | Admitting: Cardiology

## 2012-10-23 ENCOUNTER — Encounter: Payer: Medicare Other | Admitting: Cardiology

## 2012-10-23 ENCOUNTER — Ambulatory Visit (INDEPENDENT_AMBULATORY_CARE_PROVIDER_SITE_OTHER): Payer: Medicare Other | Admitting: Family Medicine

## 2012-10-23 ENCOUNTER — Encounter: Payer: Self-pay | Admitting: Family Medicine

## 2012-10-23 VITALS — BP 100/78 | HR 62 | Temp 98.7°F | Resp 18 | Wt 185.0 lb

## 2012-10-23 DIAGNOSIS — I10 Essential (primary) hypertension: Secondary | ICD-10-CM

## 2012-10-23 DIAGNOSIS — I6523 Occlusion and stenosis of bilateral carotid arteries: Secondary | ICD-10-CM

## 2012-10-23 DIAGNOSIS — Z1211 Encounter for screening for malignant neoplasm of colon: Secondary | ICD-10-CM

## 2012-10-23 DIAGNOSIS — Z23 Encounter for immunization: Secondary | ICD-10-CM

## 2012-10-23 DIAGNOSIS — I658 Occlusion and stenosis of other precerebral arteries: Secondary | ICD-10-CM

## 2012-10-23 DIAGNOSIS — I6529 Occlusion and stenosis of unspecified carotid artery: Secondary | ICD-10-CM

## 2012-10-23 DIAGNOSIS — E119 Type 2 diabetes mellitus without complications: Secondary | ICD-10-CM

## 2012-10-23 DIAGNOSIS — R002 Palpitations: Secondary | ICD-10-CM

## 2012-10-23 DIAGNOSIS — D649 Anemia, unspecified: Secondary | ICD-10-CM

## 2012-10-23 LAB — CBC WITH DIFFERENTIAL/PLATELET
Basophils Relative: 2 % — ABNORMAL HIGH (ref 0–1)
HCT: 37.3 % — ABNORMAL LOW (ref 39.0–52.0)
Hemoglobin: 12 g/dL — ABNORMAL LOW (ref 13.0–17.0)
Lymphocytes Relative: 37 % (ref 12–46)
Lymphs Abs: 1.8 10*3/uL (ref 0.7–4.0)
Monocytes Absolute: 0.4 10*3/uL (ref 0.1–1.0)
Monocytes Relative: 8 % (ref 3–12)
Neutro Abs: 2.5 10*3/uL (ref 1.7–7.7)
Neutrophils Relative %: 50 % (ref 43–77)
RBC: 4.42 MIL/uL (ref 4.22–5.81)
WBC: 4.8 10*3/uL (ref 4.0–10.5)

## 2012-10-23 LAB — FERRITIN: Ferritin: 74 ng/mL (ref 22–322)

## 2012-10-23 LAB — IRON AND TIBC
%SAT: 34 % (ref 20–55)
Iron: 111 ug/dL (ref 42–165)
UIBC: 211 ug/dL (ref 125–400)

## 2012-10-23 NOTE — Patient Instructions (Addendum)
Take the metoprolol twice a day for the heart palpitations Continue 40 units Take the insulin as prescribed Continue all other medications Referral for colonoscopy Flu shot given Ultrasound of neck ultrasound Release of records for Colonoscopy from [facility] F/U 3 months

## 2012-10-23 NOTE — Progress Notes (Signed)
Error - patient was a no-show This encounter was created in error - please disregard. Patient was a no-show.

## 2012-10-24 ENCOUNTER — Encounter: Payer: Self-pay | Admitting: Internal Medicine

## 2012-10-24 DIAGNOSIS — I6529 Occlusion and stenosis of unspecified carotid artery: Secondary | ICD-10-CM | POA: Insufficient documentation

## 2012-10-24 NOTE — Assessment & Plan Note (Signed)
Recheck HB and iron stores, refer for colonoscopy

## 2012-10-24 NOTE — Assessment & Plan Note (Signed)
I will contact [facility] again, to see if we can get some help with his medications and Insulin Given samples from office A1C uncontrolled Continue at 40 units

## 2012-10-24 NOTE — Progress Notes (Signed)
  Subjective:    Patient ID: [name], male    DOB: 1950-02-16, 62 y.o.   MRN: [id number]  HPI  Pt here to f/u DM. He has missed some doses of insulin due to not having supply. 14 day average 313. CBG 200-300.  No hypoglycemia. Labs showed anemia, pt does not recall when his last colonoscopy was. Denies rectal bleeding. Cardiology note reviewed- started on toprol without any difficulty  Review of Systems - per above   GEN- denies fatigue, fever, weight loss,weakness, recent illness HEENT- denies eye drainage, change in vision, nasal discharge, CVS- denies chest pain, palpitations RESP- denies SOB, cough, wheeze ABD- denies N/V, change in stools, abd pain Neuro- denies headache, dizziness, syncope, seizure activity      Objective:   Physical Exam GEN- NAD, alert and oriented x3 HEENT- PERRL, EOMI, non injected sclera, pink conjunctiva, MMM, oropharynx clear Neck- Supple, +bilateral carotid bruti CVS- RRR, no murmur RESP-CTAB EXT- No edema Pulses- Radial 2+        Assessment & Plan:

## 2012-10-24 NOTE — Assessment & Plan Note (Signed)
Continue BP meds looks good

## 2012-10-24 NOTE — Assessment & Plan Note (Signed)
Repeat carotid ultrasound, overdue

## 2012-10-24 NOTE — Assessment & Plan Note (Signed)
Reviewed cardiology note continue Toprol BID

## 2012-11-01 ENCOUNTER — Encounter: Payer: Self-pay | Admitting: Internal Medicine

## 2012-11-02 ENCOUNTER — Encounter (INDEPENDENT_AMBULATORY_CARE_PROVIDER_SITE_OTHER): Payer: Medicare Other

## 2012-11-02 DIAGNOSIS — R0989 Other specified symptoms and signs involving the circulatory and respiratory systems: Secondary | ICD-10-CM

## 2012-11-02 DIAGNOSIS — I6523 Occlusion and stenosis of bilateral carotid arteries: Secondary | ICD-10-CM

## 2012-11-02 DIAGNOSIS — I6529 Occlusion and stenosis of unspecified carotid artery: Secondary | ICD-10-CM

## 2012-11-20 ENCOUNTER — Encounter: Payer: Self-pay | Admitting: Internal Medicine

## 2012-11-26 ENCOUNTER — Encounter: Payer: Self-pay | Admitting: Gastroenterology

## 2012-11-26 ENCOUNTER — Other Ambulatory Visit: Payer: Self-pay | Admitting: Internal Medicine

## 2012-11-26 ENCOUNTER — Encounter (INDEPENDENT_AMBULATORY_CARE_PROVIDER_SITE_OTHER): Payer: Self-pay

## 2012-11-26 ENCOUNTER — Ambulatory Visit (INDEPENDENT_AMBULATORY_CARE_PROVIDER_SITE_OTHER): Payer: Medicare Other | Admitting: Gastroenterology

## 2012-11-26 VITALS — BP 125/66 | HR 70 | Temp 98.3°F | Ht 64.0 in | Wt 190.0 lb

## 2012-11-26 DIAGNOSIS — D649 Anemia, unspecified: Secondary | ICD-10-CM

## 2012-11-26 DIAGNOSIS — R198 Other specified symptoms and signs involving the digestive system and abdomen: Secondary | ICD-10-CM

## 2012-11-26 DIAGNOSIS — R194 Change in bowel habit: Secondary | ICD-10-CM

## 2012-11-26 MED ORDER — PEG 3350-KCL-NA BICARB-NACL 420 G PO SOLR: 4000.0000 mL | ORAL | Status: DC

## 2012-11-26 NOTE — Assessment & Plan Note (Signed)
Chronic normocytic anemia. No evidence of iron deficiency. Hemoccult status unknown. Last TCS in 2009 was normal. Patient complains of intermittent lower abdominal pain and change in stool consistency. For bowel change, would offer him a colonoscopy. I have requested he complete ifobt. If positive, and colonoscopy unremarkable, we could consider upper endoscopy at time of colonoscopy. Outside of recent mild heartburn, he is asymptomatic from UGI perspective. Suspect he has anemia of chronic disease.   Colonoscopy +/- EGD in the near future.  I have discussed the risks, alternatives, benefits with regards to but not limited to the risk of reaction to medication, bleeding, infection, perforation and the patient is agreeable to proceed. Written consent to be obtained.

## 2012-11-26 NOTE — Progress Notes (Signed)
Primary Care Physician:  Milinda Antis, MD  Primary Gastroenterologist:  Roetta Sessions, MD   Chief Complaint  Patient presents with  . Colonoscopy  . Anemia    HPI:  [name] is a 62 y.o. male here for further evaluation of normocytic anemia. Hemoccult status is unknown. Last TCS in 2009, normal. C/O intermittent lower abdominal pain discomfort, last for couple of hours. Sometimes postprandial but normally occurs when lays down and tries to sleep. Usually about 5 out of 10 on pain scale. Feels like gas pain. Not affected by BMs. Some bloating/gas. BM QOD. No brbpr, melena. Really hasn't paid attention. States his BMs are different the last several weeks. Now they are soupy instead of formed. No N/V. Some heartburn on occasion lately. No dysphagia. Weight down about 5 pounds in the last one year.  Current Outpatient Prescriptions  Medication Sig Dispense Refill  . acetaminophen (TYLENOL) 500 MG tablet Take 1,000 mg by mouth every 6 (six) hours as needed. Pain.      Marland Kitchen albuterol (PROVENTIL HFA;VENTOLIN HFA) 108 (90 BASE) MCG/ACT inhaler Inhale 2 puffs into the lungs every 4 (four) hours as needed.      Marland Kitchen amLODipine (NORVASC) 10 MG tablet Take 1 tablet (10 mg total) by mouth daily.  90 tablet  1  . aspirin 81 MG tablet Take 81 mg by mouth daily.        Marland Kitchen gabapentin (NEURONTIN) 300 MG capsule Take 1 capsule (300 mg total) by mouth 3 (three) times daily.  270 capsule  1  . Glucose Blood (BLOOD GLUCOSE TEST STRIPS) STRP For use with bayer breeze 2 meter for three times daily testing  DX 250.00  300 each  1  . Insulin Glargine (LANTUS SOLOSTAR) 100 UNIT/ML SOPN Take 40 units at bedtime for diabetes  15 mL  6  . Insulin Pen Needle (PEN NEEDLES 31GX5/16") 31G X 8 MM MISC As directed  100 each  3  . lisinopril-hydrochlorothiazide (PRINZIDE,ZESTORETIC) 20-12.5 MG per tablet Take 2 tablets by mouth daily.  180 tablet  1  . metFORMIN (GLUCOPHAGE) 1000 MG tablet Take 1 tablet (1,000 mg total) by  mouth 2 (two) times daily with a meal.  180 tablet  1  . metoprolol tartrate (LOPRESSOR) 25 MG tablet Take 1 tablet (25 mg total) by mouth 2 (two) times daily.  60 tablet  6  . simvastatin (ZOCOR) 10 MG tablet Take 1 tablet (10 mg total) by mouth at bedtime.  90 tablet  1   No current facility-administered medications for this visit.    Allergies as of 11/26/2012  . (No Known Allergies)    Past Medical History  Diagnosis Date  . Arteriosclerotic cardiovascular disease (ASCVD)     CABG in 2003 for 3 VD; 07/2007: Stress nuclear-inferior infarction without ischemia; normal EF by echo in 06/2005  . Diabetes mellitus type II   . Hyperlipidemia   . Degenerative joint disease     Left knee  . Cerebrovascular disease 2003    Carotid ultrasound in 2010: Patent right CEA site, 60-79% left internal carotid artery  . Peripheral vascular disease 2007    aortobifemoral BPG-2007  . Anemia   . Peripheral neuropathy   . Hypertension   . Sick sinus syndrome 1996    permanent pacemaker-1996; generator change in 2010  . Chronic obstructive pulmonary disease   . Obesity   . Obstructive sleep apnea   . Benign prostatic hypertrophy   . Gastroesophageal reflux disease   .  Tobacco abuse, in remission     50 pack years total consumption; Quit in 2007    Past Surgical History  Procedure Laterality Date  . Coronary artery bypass graft  2003    three-vessel disease  . Iliac artery stent      Percutaneous intervention-right external iliac; unclear whether stent was placed  . Pacemaker insertion  1996    generator change in 2010  . Carotid endarterectomy  2003    Right  . Pacemaker insertion      Generator replaced in 04/2008  . Aorta - bilateral femoral artery bypass graft  2007  . Femoral-popliteal bypass graft  1998    bilateral  . Colonoscopy  2012    Negative screening study.  . Ileocolonoscopy  02/20/2007    WUJ:WJXBJY rectum, colon, and terminal ileum    Family History  Problem  Relation Age of Onset  . Dementia Mother   . Heart attack Father   . Coronary artery disease Father   . Peripheral vascular disease Sister   . Diabetes Brother   . Peripheral vascular disease Brother   . Hypertension Brother   . Coronary artery disease Brother     History   Social History  . Marital Status: Married    Spouse Name: N/A    Number of Children: N/A  . Years of Education: 9   Occupational History  . 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 Smoking status: Former Smoker -- 2.00 packs/day for 25 years    Types: Cigarettes  . Smokeless tobacco: Former Neurosurgeon    Quit date: 02/16/1996  . Alcohol Use: No  . Drug Use: No  . Sexual Activity: Not on file   Other Topics Concern  . Not on file   Social History Narrative   Lives with wife   "slacker in school" - up to 9th grade at [employer]      ROS:  General: Negative for anorexia,  fever, chills, fatigue, weakness. See hpi. Eyes: Negative for vision changes.  ENT: Negative for hoarseness, difficulty swallowing , nasal congestion. CV: Negative for chest pain, angina, palpitations, dyspnea on exertion, peripheral edema.  Respiratory: Negative for dyspnea at rest, dyspnea on exertion, cough, sputum, wheezing.  GI: See history of present illness. GU:  Negative for dysuria, hematuria, urinary incontinence, urinary frequency, nocturnal urination.  MS: Negative for joint pain, low back pain.  Derm: Negative for rash or itching.  Neuro: Negative for weakness, abnormal sensation, seizure, frequent headaches, memory loss, confusion.  Psych: Negative for anxiety, depression, suicidal ideation, hallucinations.  Endo: see hpi. Heme: Negative for bruising or bleeding. Allergy: Negative for rash or hives.    Physical Examination:  BP 125/66  Pulse 70  Temp(Src) 98.3 F (36.8 C) (Oral)  Ht 5\' 4"  (1.626 m)  Wt 190 lb (86.183 kg)  BMI 32.6 kg/m2    General: Well-nourished, well-developed in no acute distress.  Head: Normocephalic, atraumatic.   Eyes: Conjunctiva pink, no icterus. Mouth: Oropharyngeal mucosa moist and pink , no lesions erythema or exudate. Neck: Supple without thyromegaly, masses, or lymphadenopathy.  Lungs: Clear to auscultation bilaterally.  Heart: Regular rate and rhythm, no murmurs rubs or gallops.  Abdomen: Bowel sounds are normal, nontender, nondistended, no hepatosplenomegaly or masses, no abdominal bruits or    hernia , no rebound or guarding.   Rectal: not performed Extremities: No lower extremity edema. No clubbing or deformities.  Neuro: Alert and oriented x 4 , grossly normal neurologically.  Skin: Warm and dry, no rash or jaundice.   Psych: Alert and cooperative, normal mood and affect.  Labs: Lab Results  Component Value Date   WBC 4.8 10/23/2012   HGB 12.0* 10/23/2012   HCT 37.3* 10/23/2012   MCV 84.4 10/23/2012   PLT 272 10/23/2012      Lab Results  Component Value Date   IRON 111 10/23/2012   TIBC 322 10/23/2012   FERRITIN 74 10/23/2012   Lab Results  Component Value Date   CREATININE 1.11 09/17/2012   BUN 13 09/17/2012   NA 139 09/17/2012   K 4.1 09/17/2012   CL 105 09/17/2012   CO2 27 09/17/2012   Lab Results  Component Value Date   ALT 11 09/17/2012   AST 13 09/17/2012   ALKPHOS 39 09/17/2012   BILITOT 0.7 09/17/2012   Lab Results  Component Value Date   TSH 2.284 09/17/2012     Imaging Studies: No results found.

## 2012-11-26 NOTE — Patient Instructions (Signed)
1. Please return your stool specimen ASAP. This will help [place] determine if you have blood in your stool and the extent of your work up. 2. We have scheduled you for a colonoscopy but if you have blood in your stool and nothing is found on your colonoscopy, you will get an upper endoscopy at the same time.

## 2012-11-26 NOTE — Progress Notes (Signed)
cc'd to pcp 

## 2012-11-29 ENCOUNTER — Encounter (HOSPITAL_COMMUNITY): Payer: Self-pay | Admitting: Pharmacy Technician

## 2012-12-04 ENCOUNTER — Telehealth: Payer: Self-pay | Admitting: Gastroenterology

## 2012-12-04 NOTE — Telephone Encounter (Signed)
Please remind patient we need him to complete his ifobt ASAP.

## 2012-12-06 NOTE — Telephone Encounter (Signed)
Pt is aware to do IFOBT test

## 2012-12-13 ENCOUNTER — Encounter (HOSPITAL_COMMUNITY): Admission: RE | Payer: Self-pay | Source: Ambulatory Visit

## 2012-12-13 ENCOUNTER — Telehealth: Payer: Self-pay | Admitting: Internal Medicine

## 2012-12-13 ENCOUNTER — Ambulatory Visit (HOSPITAL_COMMUNITY): Admission: RE | Admit: 2012-12-13 | Payer: Medicare Other | Source: Ambulatory Visit | Admitting: Internal Medicine

## 2012-12-13 SURGERY — COLONOSCOPY
Anesthesia: Moderate Sedation

## 2012-12-13 NOTE — Telephone Encounter (Signed)
Melanie from Endo called over and stated that [name] no showed for his procedure this morning, she tried to call his home and a lady got ugly with her on the phone and hung up on her, I then called and [name] answered the phone and got ugly with me and hung up the phone.

## 2012-12-18 ENCOUNTER — Telehealth: Payer: Self-pay | Admitting: Family Medicine

## 2012-12-18 NOTE — Telephone Encounter (Signed)
Please have them come by office to get Invokana samples to take for now See if [facility] has any way to get him Lantus, or any other insulin  Continue metformin as well

## 2012-12-18 NOTE — Telephone Encounter (Signed)
Doing nurse [facility] visit.  Pt BS after eating high carb breakfast 411.  Nurse checked it now and it was 509.  Rechecked after having him drink some water and was 563.  Pt tells nurse only has enough Lantus for today.  Asking for samples (we have none)  Lantus was refilled in August + 6 add RF's.  Nurse needs to stress to patient this is not a medication he can do without. Must pick up refill today.  Mess will be relayed to provider.

## 2012-12-19 ENCOUNTER — Telehealth: Payer: Self-pay | Admitting: Family Medicine

## 2012-12-19 NOTE — Telephone Encounter (Signed)
[name] needs clarification of medicine that was picked up for Mr. Goth.    Okey Dupre  [phone]

## 2012-12-19 NOTE — Telephone Encounter (Signed)
LMTRC

## 2012-12-19 NOTE — Telephone Encounter (Signed)
noted 

## 2012-12-19 NOTE — Telephone Encounter (Signed)
NOTED

## 2012-12-19 NOTE — Telephone Encounter (Signed)
Able to reach patient today.  Told him about Invokana samples here.  To take until able to refill insulin.  Pt states understanding and will pick up tomorrow!  Says has enough medicine for today!

## 2012-12-19 NOTE — Telephone Encounter (Signed)
[name] from [facility] called back and wanted to know how much Invokana he is suppose to take it I told her he is suppose to take only once a day until he can get his insulin.

## 2012-12-25 ENCOUNTER — Telehealth: Payer: Self-pay | Admitting: Family Medicine

## 2012-12-25 NOTE — Telephone Encounter (Signed)
, °

## 2012-12-25 NOTE — Telephone Encounter (Signed)
Please call pt, to come pick up Lantus , he needs to take 40 units at bedtime Continue metformin and Invokana Schedule an appt for Friday , bring his meter

## 2012-12-25 NOTE — Telephone Encounter (Signed)
Called Thelma pt sister no answer left message to return my call, called pt home number to speak to pt and wife hung up on me. Tried calling back no one will answer.

## 2012-12-25 NOTE — Telephone Encounter (Signed)
Patient FBS's have been running high .  Sunday it was 519 , Monday 418, Tuesday 344 . Patient checked while I was on the phone with him and it was 459.

## 2012-12-26 NOTE — Telephone Encounter (Signed)
Pt is coming in on Friday per dr. Jeanice Lim

## 2012-12-28 ENCOUNTER — Ambulatory Visit: Payer: Self-pay | Admitting: Family Medicine

## 2013-01-01 ENCOUNTER — Encounter: Payer: Self-pay | Admitting: Family Medicine

## 2013-01-01 ENCOUNTER — Ambulatory Visit (INDEPENDENT_AMBULATORY_CARE_PROVIDER_SITE_OTHER): Payer: Medicare Other | Admitting: Family Medicine

## 2013-01-01 VITALS — BP 100/78 | HR 84 | Temp 98.3°F | Resp 18 | Ht 63.0 in | Wt 184.0 lb

## 2013-01-01 DIAGNOSIS — E119 Type 2 diabetes mellitus without complications: Secondary | ICD-10-CM

## 2013-01-01 DIAGNOSIS — I10 Essential (primary) hypertension: Secondary | ICD-10-CM

## 2013-01-01 LAB — MICROALBUMIN / CREATININE URINE RATIO
Creatinine, Urine: 43.4 mg/dL
Microalb, Ur: 3.61 mg/dL — ABNORMAL HIGH (ref 0.00–1.89)

## 2013-01-01 LAB — HEMOGLOBIN A1C, FINGERSTICK: Hgb A1C (fingerstick): >14 % — ABNORMAL HIGH (ref <5.7)

## 2013-01-01 LAB — BASIC METABOLIC PANEL WITH GFR
BUN: 24 mg/dL — ABNORMAL HIGH (ref 6–23)
CO2: 25 mEq/L (ref 19–32)
Calcium: 9.8 mg/dL (ref 8.4–10.5)
Chloride: 99 mEq/L (ref 96–112)
Creat: 1.31 mg/dL (ref 0.50–1.35)
Glucose, Bld: 414 mg/dL (ref 70–99)

## 2013-01-01 LAB — HEMOGLOBIN A1C
Hgb A1c MFr Bld: 13.5 % — ABNORMAL HIGH (ref <5.7)
Mean Plasma Glucose: 341 mg/dL — ABNORMAL HIGH (ref <117)

## 2013-01-01 NOTE — Assessment & Plan Note (Signed)
Diabetes has severely uncontrolled. This is secondary to not being able to get his medications on regular basis. Our for [facility] to [facility]. We have some Lantus which she will start here in the office. He will continue with 40 units. I will also start him on 5 units of Humalog with each meal which we also had samples of here in the office  He will followup in one week Metabolic panel done

## 2013-01-01 NOTE — Assessment & Plan Note (Signed)
Blood pressure looks okay today. Will continue his current medication

## 2013-01-01 NOTE — Assessment & Plan Note (Signed)
Pressure looks okay today we will continue his current medications

## 2013-01-01 NOTE — Progress Notes (Signed)
  Subjective:    Patient ID: [name], male    DOB: 12-05-1950, 63 y.o.   MRN: [id number]  HPI  Patient here to followup his diabetes. She's not had his insulin and over month. He did take him a, for couple weeks as we were trying to find him some insulin. His last A1c are very poor control. His A1c done during today's visit was greater than 14%. His blood sugars have been ranging 300-500. He denies any polyuria or polydipsia. Denies any chest pain shortness of breath or headache.  He has most of his other medications but is out of one of his blood pressure medications. Of note he also has metformin however this bottle was from July of 2014.    Review of Systems   GEN- denies fatigue, fever, weight loss,weakness, recent illness HEENT- denies eye drainage, change in vision, nasal discharge, CVS- denies chest pain, palpitations RESP- denies SOB, cough, wheeze ABD- denies N/V, change in stools, abd pain Neuro- denies headache, dizziness, syncope, seizure activity      Objective:   Physical Exam  GEN- NAD, alert and oriented x3 CVS- RRR, no murmur RESP-CTAB EXT- No edema Pulses- Radial 2+          Assessment & Plan:  [phone]  - [name]

## 2013-01-01 NOTE — Patient Instructions (Addendum)
Referral to [facility] Take Lantus 40 units Take Metformin twice a day Continue blood pressure pills Start Humalog 5 units with meals F/U 1 week- Bring your meter

## 2013-01-03 ENCOUNTER — Telehealth: Payer: Self-pay | Admitting: *Deleted

## 2013-01-03 NOTE — Telephone Encounter (Signed)
Faxed over information to [facility]

## 2013-01-08 ENCOUNTER — Encounter: Payer: Self-pay | Admitting: Family Medicine

## 2013-01-08 ENCOUNTER — Ambulatory Visit (INDEPENDENT_AMBULATORY_CARE_PROVIDER_SITE_OTHER): Payer: Medicare Other | Admitting: Family Medicine

## 2013-01-08 VITALS — BP 110/78 | HR 78 | Temp 97.4°F | Resp 18 | Ht 63.0 in | Wt 192.0 lb

## 2013-01-08 DIAGNOSIS — E119 Type 2 diabetes mellitus without complications: Secondary | ICD-10-CM

## 2013-01-08 MED ORDER — INSULIN GLARGINE 100 UNIT/ML SOLOSTAR PEN: PEN_INJECTOR | SUBCUTANEOUS | Status: DC

## 2013-01-08 MED ORDER — INSULIN LISPRO 100 UNIT/ML (KWIKPEN): 10.0000 [IU] | PEN_INJECTOR | Freq: Three times a day (TID) | SUBCUTANEOUS | Status: DC

## 2013-01-08 NOTE — Progress Notes (Signed)
  Subjective:    Patient ID: [name], male    DOB: [birth year], 62 y.o.   MRN: [id number]  HPI Patient here for one-week followup on his blood sugars. At our last visit he was given Lantus as well as Humalog to take with meals. Lantus 40 units at bedtime Novlog 5 units at bedtime  - Blood sugars have been 190-380  on his meter fasting and with meals. He has not had any hypoglycemia. He is taking metformin as prescribed. He has both his blood pressure medications.  [facility] was [facility] with [facility] but was told that he did not qualify I did not receive any rejection letter regarding this patient   Review of Systems - PER ABOVE     Objective:   Physical Exam GEN-NAD,alert and oriented x 3       Assessment & Plan:

## 2013-01-08 NOTE — Patient Instructions (Signed)
Continue lantus 40units at bedtime Increase mealtime HUmalog to 10 units with each meal  F/u 4 weeks- change the Dec 9th appt

## 2013-01-08 NOTE — Assessment & Plan Note (Signed)
Diabetes mellitus uncontrolled. If he can keep his medication a regular basis I think would be a to improve his diabetes mellitus. Will have him increase the Humalog with meals to 10 units. He will continue the metformin and Lantus at the current dose. His blood sugars are much improved as they were previously 400 and 500. We will work into giving him another pharmacy that can assist with his medications.

## 2013-01-18 ENCOUNTER — Telehealth: Payer: Self-pay | Admitting: *Deleted

## 2013-01-18 NOTE — Telephone Encounter (Signed)
Send his Medications in to [facility] - information on your desk, this is a company he contacted that previously sent him medications.

## 2013-01-18 NOTE — Telephone Encounter (Signed)
Called [facility] to find out about [name] and they stated that he was Medicare only does not have low income subsidy [facility]PA did inform of how to signup for low subsidy income medicare, says that it will take 30 days from sign up to get approval. Once approved [facility]PA will rerun to see if he has been approved adn pick him up and start services.

## 2013-01-18 NOTE — Telephone Encounter (Signed)
Call and take care of Monday

## 2013-01-22 ENCOUNTER — Ambulatory Visit: Payer: Medicare Other | Admitting: Family Medicine

## 2013-01-30 NOTE — Telephone Encounter (Signed)
Contacted [facility] they will be taking care of this

## 2013-02-11 ENCOUNTER — Ambulatory Visit (INDEPENDENT_AMBULATORY_CARE_PROVIDER_SITE_OTHER): Payer: Medicare Other | Admitting: Family Medicine

## 2013-02-11 ENCOUNTER — Encounter: Payer: Self-pay | Admitting: Family Medicine

## 2013-02-11 VITALS — BP 146/96 | HR 60 | Temp 97.7°F | Resp 18 | Wt 197.0 lb

## 2013-02-11 DIAGNOSIS — I10 Essential (primary) hypertension: Secondary | ICD-10-CM

## 2013-02-11 DIAGNOSIS — E119 Type 2 diabetes mellitus without complications: Secondary | ICD-10-CM

## 2013-02-11 NOTE — Patient Instructions (Signed)
Continue current medications Take Humalog 10 units with breakfast and dinner Continue Lantus 40units at bedtime F/U 2 months- Come Fasting

## 2013-02-12 NOTE — Progress Notes (Signed)
   Subjective:    Patient ID: [name], male    DOB: [birth year], 62 y.o.   MRN: [id number]  HPI   Patient here for interim visit for his diabetes mellitus. Her last visit his insulin was titrated. He currently has Lantus 40 units and he does have this with him today. He also has Humalog which she's been taking 5-10 units twice a day with meals. He is also taking his other medications. Of note he has been out of his lisinopril HCTZ for the past few days. cbg fasting 92-125, he had elevated 191 christmas day,  14 day average 132    Review of Systems  GEN- denies fatigue, fever, weight loss,weakness, recent illness HEENT- denies eye drainage, change in vision, nasal discharge, CVS- denies chest pain, palpitations RESP- denies SOB, cough, wheeze ABD- denies N/V, change in stools, abd pain       Objective:   Physical Exam GEN- NAD, alert and oriented x3 CVS- RRR, no murmur RESP-CTAB EXT- No edema  Pulses- Radial, DP- 2+        Assessment & Plan:

## 2013-02-12 NOTE — Assessment & Plan Note (Signed)
Blood sugars are much improved with the use of insulin. He'll continue Lantus 40 units.  I sent use 10 units of Humalog with breakfast and dinner. He was provided with another Humalog pen

## 2013-02-12 NOTE — Assessment & Plan Note (Signed)
He will be able to pick up his blood pressure medications this week. He will continue his current regimen in which his blood pressure was controlled on.

## 2013-03-14 ENCOUNTER — Other Ambulatory Visit: Payer: Self-pay | Admitting: Family Medicine

## 2013-03-15 ENCOUNTER — Telehealth: Payer: Self-pay | Admitting: Family Medicine

## 2013-03-15 MED ORDER — INSULIN GLARGINE 100 UNIT/ML SOLOSTAR PEN: PEN_INJECTOR | SUBCUTANEOUS | Status: DC

## 2013-03-15 NOTE — Telephone Encounter (Signed)
Meds refilled.

## 2013-03-15 NOTE — Telephone Encounter (Signed)
[place]arm[place] [facility] [place] Call back number is [phone] Pt is needing a refill on Lantus

## 2013-03-15 NOTE — Telephone Encounter (Signed)
Medication refilled per protocol. 

## 2013-04-16 ENCOUNTER — Ambulatory Visit: Payer: Medicare Other | Admitting: Family Medicine

## 2013-04-24 ENCOUNTER — Telehealth: Payer: Self-pay | Admitting: *Deleted

## 2013-04-24 MED ORDER — LISINOPRIL-HYDROCHLOROTHIAZIDE 20-12.5 MG PO TABS: 2.0000 | ORAL_TABLET | Freq: Every day | ORAL | Status: DC

## 2013-04-24 MED ORDER — GABAPENTIN 300 MG PO CAPS: 300.0000 mg | ORAL_CAPSULE | Freq: Three times a day (TID) | ORAL | Status: DC

## 2013-04-24 MED ORDER — SIMVASTATIN 10 MG PO TABS: 10.0000 mg | ORAL_TABLET | Freq: Every day | ORAL | Status: DC

## 2013-04-24 MED ORDER — ASPIRIN 81 MG PO TABS: 81.0000 mg | ORAL_TABLET | Freq: Every day | ORAL | Status: DC

## 2013-04-24 NOTE — Telephone Encounter (Signed)
Refill appropriate and filled per protocol. 

## 2013-04-24 NOTE — Telephone Encounter (Signed)
Refills on Medications   Gabapentin 300mg ; simvastatin 10mg ;lisinopril-HCTZ 20-12.5mg ; asprin 81mg    Pharmacy [facility]- [phone]  Pt call back [phone]

## 2013-04-30 ENCOUNTER — Ambulatory Visit: Payer: Medicare HMO | Admitting: Family Medicine

## 2013-05-07 ENCOUNTER — Ambulatory Visit (INDEPENDENT_AMBULATORY_CARE_PROVIDER_SITE_OTHER): Payer: Medicare HMO | Admitting: Family Medicine

## 2013-05-07 ENCOUNTER — Encounter: Payer: Self-pay | Admitting: Family Medicine

## 2013-05-07 VITALS — BP 130/78 | HR 66 | Temp 97.3°F | Resp 16 | Ht 64.5 in | Wt 194.0 lb

## 2013-05-07 DIAGNOSIS — E785 Hyperlipidemia, unspecified: Secondary | ICD-10-CM

## 2013-05-07 DIAGNOSIS — I10 Essential (primary) hypertension: Secondary | ICD-10-CM

## 2013-05-07 DIAGNOSIS — E1149 Type 2 diabetes mellitus with other diabetic neurological complication: Secondary | ICD-10-CM

## 2013-05-07 DIAGNOSIS — E1140 Type 2 diabetes mellitus with diabetic neuropathy, unspecified: Secondary | ICD-10-CM

## 2013-05-07 DIAGNOSIS — E119 Type 2 diabetes mellitus without complications: Secondary | ICD-10-CM

## 2013-05-07 DIAGNOSIS — E1142 Type 2 diabetes mellitus with diabetic polyneuropathy: Secondary | ICD-10-CM

## 2013-05-07 LAB — CBC WITH DIFFERENTIAL/PLATELET
BASOS ABS: 0 10*3/uL (ref 0.0–0.1)
Basophils Relative: 1 % (ref 0–1)
EOS PCT: 4 % (ref 0–5)
Eosinophils Absolute: 0.2 10*3/uL (ref 0.0–0.7)
HEMATOCRIT: 38.2 % — AB (ref 39.0–52.0)
Hemoglobin: 12.5 g/dL — ABNORMAL LOW (ref 13.0–17.0)
LYMPHS ABS: 2.2 10*3/uL (ref 0.7–4.0)
LYMPHS PCT: 48 % — AB (ref 12–46)
MCH: 27.1 pg (ref 26.0–34.0)
MCHC: 32.7 g/dL (ref 30.0–36.0)
MCV: 82.7 fL (ref 78.0–100.0)
MONO ABS: 0.6 10*3/uL (ref 0.1–1.0)
Monocytes Relative: 12 % (ref 3–12)
NEUTROS ABS: 1.6 10*3/uL — AB (ref 1.7–7.7)
Neutrophils Relative %: 35 % — ABNORMAL LOW (ref 43–77)
Platelets: 242 10*3/uL (ref 150–400)
RBC: 4.62 MIL/uL (ref 4.22–5.81)
RDW: 13.3 % (ref 11.5–15.5)
WBC: 4.6 10*3/uL (ref 4.0–10.5)

## 2013-05-07 LAB — HEMOGLOBIN A1C, FINGERSTICK: HEMOGLOBIN A1C, FINGERSTICK: 9.4 % — AB (ref <5.7)

## 2013-05-07 NOTE — Progress Notes (Signed)
Patient ID: Collin LeitzWilliam F Bogle, male   DOB: 01/02/1951, 63 y.o.   MRN: [id number]   Subjective:    Patient ID: Collin LeitzWilliam F Oommen, male    DOB: 01/23/1951, 63 y.o.   MRN: [id number]  Patient presents for 2 month F/U   Patient here to followup diabetes mellitus hypertension hyperlipidemia. He missed his last interim followup. He has his blood glucometer with him today which shows a 14 day average of 187 his fasting this morning was 201 for the past 6 weeks his blood sugars have been in the 150s to low 200s no hypoglycemia. He is taking Lantus 40 units as well as Humalog 10 units with each meal  He has no specific concerns today. He is due for fasting labs. Note he does not want to proceed with colonoscopy at this time he did miss his previous colonoscopy appointment which is noted in the chart     Review Of Systems:  GEN- denies fatigue, fever, weight loss,weakness, recent illness HEENT- denies eye drainage, change in vision, nasal discharge, CVS- denies chest pain, palpitations RESP- denies SOB, cough, wheeze ABD- denies N/V, change in stools, abd pain GU- denies dysuria, hematuria, dribbling, incontinence MSK- denies joint pain, muscle aches, injury Neuro- denies headache, dizziness, syncope, seizure activity       Objective:    BP 130/78  Pulse 66  Temp(Src) 97.3 F (36.3 C)  Resp 16  Ht 5' 4.5" (1.638 m)  Wt 194 lb (87.998 kg)  BMI 32.80 kg/m2 GEN- NAD, alert and oriented x3 HEENT- PERRL, EOMI, non injected sclera, pink conjunctiva, MMM, oropharynx clear CVS- RRR, no murmur RESP-CTAB EXT- No edema Pulses- Radial, DP- 2+        Assessment & Plan:      Problem List Items Addressed This Visit   Type II or unspecified type diabetes mellitus with neurological manifestations, 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 I'll have him continue Lantus 40 units in the NovoLog 10 unitswith each meal Continue metformin    HYPERTENSION     Blood pressures elevated some today he has taken his medications typically his blood pressure looks fairly well on the current regimen therefore I would not change it    HYPERLIPIDEMIA     Recheck his lipid panel today note he did eat earlier this morning however take that into consideration with his triglycerides he is currently on statin drug     Diabetic neuropathy     We'll continue the gabapentin which is controlling his symptoms       Note: This dictation was prepared with Dragon dictation along with smaller phrase technology. Any transcriptional errors that result from this process are unintentional.

## 2013-05-07 NOTE — Patient Instructions (Signed)
Continue your blood pressure medications Medications sent to [facility] F/U 3 months

## 2013-05-08 ENCOUNTER — Other Ambulatory Visit: Payer: Self-pay | Admitting: *Deleted

## 2013-05-08 LAB — COMPREHENSIVE METABOLIC PANEL
ALBUMIN: 3.9 g/dL (ref 3.5–5.2)
ALT: 14 U/L (ref 0–53)
AST: 16 U/L (ref 0–37)
Alkaline Phosphatase: 44 U/L (ref 39–117)
BUN: 20 mg/dL (ref 6–23)
CHLORIDE: 102 meq/L (ref 96–112)
CO2: 27 meq/L (ref 19–32)
Calcium: 9.6 mg/dL (ref 8.4–10.5)
Creat: 1.34 mg/dL (ref 0.50–1.35)
Glucose, Bld: 157 mg/dL — ABNORMAL HIGH (ref 70–99)
Potassium: 4.2 mEq/L (ref 3.5–5.3)
Sodium: 139 mEq/L (ref 135–145)
Total Bilirubin: 0.5 mg/dL (ref 0.2–1.2)
Total Protein: 6.7 g/dL (ref 6.0–8.3)

## 2013-05-08 LAB — LIPID PANEL
CHOL/HDL RATIO: 5.8 ratio
Cholesterol: 151 mg/dL (ref 0–200)
HDL: 26 mg/dL — ABNORMAL LOW (ref >39)
LDL Cholesterol: 78 mg/dL (ref 0–99)
Triglycerides: 233 mg/dL — ABNORMAL HIGH (ref <150)
VLDL: 47 mg/dL — ABNORMAL HIGH (ref 0–40)

## 2013-05-08 MED ORDER — ASPIRIN 81 MG PO TABS: 81.0000 mg | ORAL_TABLET | Freq: Every day | ORAL | Status: AC

## 2013-05-08 MED ORDER — METOPROLOL TARTRATE 25 MG PO TABS: 25.0000 mg | ORAL_TABLET | Freq: Two times a day (BID) | ORAL | Status: DC

## 2013-05-08 MED ORDER — GABAPENTIN 300 MG PO CAPS: 300.0000 mg | ORAL_CAPSULE | Freq: Three times a day (TID) | ORAL | Status: DC

## 2013-05-08 MED ORDER — LISINOPRIL-HYDROCHLOROTHIAZIDE 20-12.5 MG PO TABS: 2.0000 | ORAL_TABLET | Freq: Every day | ORAL | Status: DC

## 2013-05-08 MED ORDER — SIMVASTATIN 10 MG PO TABS: 10.0000 mg | ORAL_TABLET | Freq: Every day | ORAL | Status: DC

## 2013-05-08 MED ORDER — INSULIN GLARGINE 100 UNIT/ML SOLOSTAR PEN: PEN_INJECTOR | SUBCUTANEOUS | Status: DC

## 2013-05-08 MED ORDER — ACETAMINOPHEN 500 MG PO TABS: 1000.0000 mg | ORAL_TABLET | Freq: Four times a day (QID) | ORAL | Status: AC | PRN

## 2013-05-08 MED ORDER — METFORMIN HCL 1000 MG PO TABS: 1000.0000 mg | ORAL_TABLET | Freq: Two times a day (BID) | ORAL | Status: DC

## 2013-05-08 MED ORDER — ALBUTEROL SULFATE HFA 108 (90 BASE) MCG/ACT IN AERS: 2.0000 | INHALATION_SPRAY | RESPIRATORY_TRACT | Status: DC | PRN

## 2013-05-08 MED ORDER — GLUCOSE BLOOD VI STRP: ORAL_STRIP | Status: DC

## 2013-05-08 MED ORDER — INSULIN LISPRO 100 UNIT/ML (KWIKPEN): 10.0000 [IU] | PEN_INJECTOR | Freq: Three times a day (TID) | SUBCUTANEOUS | Status: DC

## 2013-05-08 NOTE — Assessment & Plan Note (Signed)
We'll continue the gabapentin which is controlling his symptoms

## 2013-05-08 NOTE — Assessment & Plan Note (Signed)
Blood pressures elevated some today he has taken his medications typically his blood pressure looks fairly well on the current regimen therefore I would not change it

## 2013-05-08 NOTE — Assessment & Plan Note (Signed)
Recheck his lipid panel today note he did eat earlier this morning however take that into consideration with his triglycerides he is currently on statin drug

## 2013-05-08 NOTE — Assessment & Plan Note (Addendum)
His A1c is actually improved today from his previous 13.5% down to 9.4% I think this is due to compliance with his medications and having them on a regular basis. I'll have him continue Lantus 40 units in the NovoLog 10 unitswith each meal Continue metformin

## 2013-05-08 NOTE — Telephone Encounter (Signed)
Per orders from MD, all medications refilled.  

## 2013-06-20 ENCOUNTER — Other Ambulatory Visit: Payer: Self-pay | Admitting: Family Medicine

## 2013-06-20 NOTE — Telephone Encounter (Signed)
Medication refilled per protocol. 

## 2013-06-21 ENCOUNTER — Telehealth: Payer: Self-pay | Admitting: Family Medicine

## 2013-06-21 MED ORDER — SIMVASTATIN 10 MG PO TABS: 10.0000 mg | ORAL_TABLET | Freq: Every day | ORAL | Status: DC

## 2013-06-21 MED ORDER — METFORMIN HCL 1000 MG PO TABS: 1000.0000 mg | ORAL_TABLET | Freq: Two times a day (BID) | ORAL | Status: DC

## 2013-06-21 MED ORDER — INSULIN GLARGINE 100 UNIT/ML SOLOSTAR PEN: PEN_INJECTOR | SUBCUTANEOUS | Status: DC

## 2013-06-21 MED ORDER — ALBUTEROL SULFATE HFA 108 (90 BASE) MCG/ACT IN AERS: 2.0000 | INHALATION_SPRAY | RESPIRATORY_TRACT | Status: AC | PRN

## 2013-06-21 MED ORDER — METOPROLOL TARTRATE 25 MG PO TABS: 25.0000 mg | ORAL_TABLET | Freq: Two times a day (BID) | ORAL | Status: DC

## 2013-06-21 MED ORDER — LISINOPRIL-HYDROCHLOROTHIAZIDE 20-12.5 MG PO TABS: 2.0000 | ORAL_TABLET | Freq: Every day | ORAL | Status: DC

## 2013-06-21 MED ORDER — GABAPENTIN 300 MG PO CAPS: 300.0000 mg | ORAL_CAPSULE | Freq: Three times a day (TID) | ORAL | Status: DC

## 2013-06-21 MED ORDER — INSULIN LISPRO 100 UNIT/ML (KWIKPEN): 10.0000 [IU] | PEN_INJECTOR | Freq: Three times a day (TID) | SUBCUTANEOUS | Status: DC

## 2013-06-21 NOTE — Telephone Encounter (Signed)
Prescription sent to pharmacy. .   Call placed to patient and patient made aware.  

## 2013-06-21 NOTE — Telephone Encounter (Signed)
[phone] Pt is needing a refill on all of his medications states he is out of everything expect his insulin.  Pharmacy [facility]

## 2013-06-24 ENCOUNTER — Telehealth: Payer: Self-pay | Admitting: *Deleted

## 2013-06-24 MED ORDER — INSULIN GLARGINE 100 UNIT/ML SOLOSTAR PEN: PEN_INJECTOR | SUBCUTANEOUS | Status: DC

## 2013-06-24 MED ORDER — INSULIN LISPRO 100 UNIT/ML (KWIKPEN)
10.0000 [IU] | PEN_INJECTOR | Freq: Three times a day (TID) | SUBCUTANEOUS | Status: DC
Start: 2013-06-24 — End: 2013-08-28

## 2013-06-24 NOTE — Telephone Encounter (Signed)
Message copied by Phillips OdorSIX, Jamila Slatten H on Mon Jun 24, 2013  9:59 AM ------      Message from: Harriet MassonOBERTS, CHELSEA N      Created: Mon Jun 24, 2013  9:56 AM      Regarding: [facility]      Contact: [phone]       [facility] is calling to speak to you about this pt ------

## 2013-08-07 ENCOUNTER — Ambulatory Visit: Payer: Medicare HMO | Admitting: Family Medicine

## 2013-08-07 ENCOUNTER — Other Ambulatory Visit: Payer: Self-pay | Admitting: Family Medicine

## 2013-08-07 NOTE — Telephone Encounter (Signed)
Refill appropriate and filled per protocol. 

## 2013-08-12 ENCOUNTER — Telehealth: Payer: Self-pay | Admitting: *Deleted

## 2013-08-12 NOTE — Telephone Encounter (Signed)
[facility] he is due for PNA and Colonoscopy shot but has refused to have colonoscopy done wants to wait until his visit with provider to discuss PNA and colonoscopy at that time.

## 2013-08-14 ENCOUNTER — Other Ambulatory Visit: Payer: Self-pay | Admitting: *Deleted

## 2013-08-14 ENCOUNTER — Telehealth: Payer: Self-pay | Admitting: *Deleted

## 2013-08-14 NOTE — Telephone Encounter (Signed)
Received call from patient.   Reports that he requires refill on test strips for glucometer.   Call placed to pharmacy.   Advised that he had received 90 day supply of test strips recently and strips could not be refilled until 09/06/2013.  Call placed to patient and his wife made aware.

## 2013-08-24 ENCOUNTER — Other Ambulatory Visit: Payer: Self-pay | Admitting: *Deleted

## 2013-08-24 DIAGNOSIS — E785 Hyperlipidemia, unspecified: Secondary | ICD-10-CM

## 2013-08-24 DIAGNOSIS — I10 Essential (primary) hypertension: Secondary | ICD-10-CM

## 2013-08-24 DIAGNOSIS — E119 Type 2 diabetes mellitus without complications: Secondary | ICD-10-CM

## 2013-08-28 ENCOUNTER — Encounter: Payer: Self-pay | Admitting: Family Medicine

## 2013-08-28 ENCOUNTER — Ambulatory Visit (INDEPENDENT_AMBULATORY_CARE_PROVIDER_SITE_OTHER): Payer: Medicare HMO | Admitting: Family Medicine

## 2013-08-28 VITALS — BP 136/82 | HR 78 | Temp 97.7°F | Resp 16 | Ht 64.0 in | Wt 195.0 lb

## 2013-08-28 DIAGNOSIS — E785 Hyperlipidemia, unspecified: Secondary | ICD-10-CM

## 2013-08-28 DIAGNOSIS — R319 Hematuria, unspecified: Secondary | ICD-10-CM

## 2013-08-28 DIAGNOSIS — W57XXXA Bitten or stung by nonvenomous insect and other nonvenomous arthropods, initial encounter: Secondary | ICD-10-CM

## 2013-08-28 DIAGNOSIS — T148 Other injury of unspecified body region: Secondary | ICD-10-CM

## 2013-08-28 DIAGNOSIS — E1149 Type 2 diabetes mellitus with other diabetic neurological complication: Secondary | ICD-10-CM

## 2013-08-28 LAB — CBC WITH DIFFERENTIAL/PLATELET
BASOS PCT: 1 % (ref 0–1)
Basophils Absolute: 0.1 10*3/uL (ref 0.0–0.1)
Eosinophils Absolute: 0.2 10*3/uL (ref 0.0–0.7)
Eosinophils Relative: 4 % (ref 0–5)
HCT: 35.3 % — ABNORMAL LOW (ref 39.0–52.0)
HEMOGLOBIN: 11.2 g/dL — AB (ref 13.0–17.0)
Lymphocytes Relative: 35 % (ref 12–46)
Lymphs Abs: 1.9 10*3/uL (ref 0.7–4.0)
MCH: 26.5 pg (ref 26.0–34.0)
MCHC: 31.7 g/dL (ref 30.0–36.0)
MCV: 83.6 fL (ref 78.0–100.0)
MONOS PCT: 8 % (ref 3–12)
Monocytes Absolute: 0.4 10*3/uL (ref 0.1–1.0)
NEUTROS ABS: 2.9 10*3/uL (ref 1.7–7.7)
Neutrophils Relative %: 52 % (ref 43–77)
PLATELETS: 278 10*3/uL (ref 150–400)
RBC: 4.22 MIL/uL (ref 4.22–5.81)
RDW: 12.9 % (ref 11.5–15.5)
WBC: 5.5 10*3/uL (ref 4.0–10.5)

## 2013-08-28 LAB — LIPID PANEL
CHOLESTEROL: 119 mg/dL (ref 0–200)
HDL: 23 mg/dL — AB (ref >39)
LDL CALC: 44 mg/dL (ref 0–99)
TRIGLYCERIDES: 261 mg/dL — AB (ref <150)
Total CHOL/HDL Ratio: 5.2 Ratio
VLDL: 52 mg/dL — ABNORMAL HIGH (ref 0–40)

## 2013-08-28 LAB — COMPLETE METABOLIC PANEL WITH GFR
ALBUMIN: 3.8 g/dL (ref 3.5–5.2)
ALK PHOS: 53 U/L (ref 39–117)
ALT: 12 U/L (ref 0–53)
AST: 13 U/L (ref 0–37)
BILIRUBIN TOTAL: 0.8 mg/dL (ref 0.2–1.2)
BUN: 16 mg/dL (ref 6–23)
CO2: 27 mEq/L (ref 19–32)
Calcium: 9.2 mg/dL (ref 8.4–10.5)
Chloride: 103 mEq/L (ref 96–112)
Creat: 1.29 mg/dL (ref 0.50–1.35)
GFR, EST NON AFRICAN AMERICAN: 59 mL/min — AB
GFR, Est African American: 68 mL/min
Glucose, Bld: 238 mg/dL — ABNORMAL HIGH (ref 70–99)
Potassium: 4 mEq/L (ref 3.5–5.3)
SODIUM: 138 meq/L (ref 135–145)
TOTAL PROTEIN: 6.5 g/dL (ref 6.0–8.3)

## 2013-08-28 LAB — HEMOGLOBIN A1C
Hgb A1c MFr Bld: 11.5 % — ABNORMAL HIGH (ref <5.7)
Mean Plasma Glucose: 283 mg/dL — ABNORMAL HIGH (ref <117)

## 2013-08-28 MED ORDER — TRIAMCINOLONE ACETONIDE 0.1 % EX CREA: 1.0000 "application " | TOPICAL_CREAM | Freq: Two times a day (BID) | CUTANEOUS | Status: DC

## 2013-08-28 NOTE — Assessment & Plan Note (Signed)
Recheck lipids on statin 

## 2013-08-28 NOTE — Assessment & Plan Note (Signed)
Increase lantus to 45 units Increase humalog to 12 units  Labs to be done, non fasting but due to pt having difficulty getting to appointments will go ahead and do them F/u 4 weeks with meter

## 2013-08-28 NOTE — Assessment & Plan Note (Signed)
This looks like chigger bites, i am concerned about oral prednisone due to his sugars, will use topical TAC instead

## 2013-08-28 NOTE — Assessment & Plan Note (Signed)
Blood pressure looks good today, no change to meds

## 2013-08-28 NOTE — Patient Instructions (Addendum)
Take 45 units of Lantus at bedtime Give 12 units of humalog with meals  Use the cream twice a day  F/U 4 weeks for your sugars- BRING YOUR METER AND YOUR MEDICATIONS

## 2013-08-28 NOTE — Progress Notes (Signed)
Patient ID: [name], male   DOB: 01/19/1951, 63 y.o.   MRN: [id number]   Subjective:    Patient ID: [name], male    DOB: 09/01/1950, 63 y.o.   MRN: [id number]  Patient presents for 3 month F/U and Rash   patient here follow chronic medical problems. He did not bring his medications or his meter with him today. His diabetes mellitus was uncontrolled as last A1c was 9.6%. He is given Lantus 40 units and 10 units of Humalog with each meal. He states his blood sugar fasting is in the 200s and before meal in the 190s. He denies any hypoglycemia.  He also is concerned about a rash in his legs they say he was out some tall grass and leaves about a week ago and has had small red bumps on his lower leg they're very itchy in nature. He's been using rubbing alcohol. He's not had any drainage from the lesions.    Review Of Systems:  GEN- denies fatigue, fever, weight loss,weakness, recent illness HEENT- denies eye drainage, change in vision, nasal discharge, CVS- denies chest pain, palpitations RESP- denies SOB, cough, wheeze ABD- denies N/V, change in stools, abd pain Neuro- denies headache, dizziness, syncope, seizure activity       Objective:    BP 136/82  Pulse 78  Temp(Src) 97.7 F (36.5 C) (Oral)  Resp 16  Ht 5\' 4"  (1.626 m)  Wt 195 lb (88.451 kg)  BMI 33.46 kg/m2 GEN- NAD, alert and oriented x3 HEENT- PERRL, EOMI, non injected sclera, pink conjunctiva, MMM, oropharynx clear Neck- Supple, no LAD CVS- RRR, no murmur RESP-CTAB EXT- No edema Skin- multiple erythematous maculopapular lesions lesions in a sock lite pattern from his ankle to mid shin Pulses- Radial 2+, DP dminished         Assessment & Plan:      Problem List Items Addressed This Visit   Type II or unspecified type diabetes mellitus with neurological manifestations, 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 Any transcriptional errors that result from this process are unintentional.

## 2013-09-24 ENCOUNTER — Ambulatory Visit: Payer: Self-pay | Admitting: Family Medicine

## 2013-09-25 ENCOUNTER — Ambulatory Visit: Payer: Self-pay | Admitting: Family Medicine

## 2013-10-18 ENCOUNTER — Ambulatory Visit: Payer: Medicare HMO | Admitting: Family Medicine

## 2013-10-22 ENCOUNTER — Ambulatory Visit: Payer: Medicare HMO | Admitting: Family Medicine

## 2013-10-24 ENCOUNTER — Telehealth: Payer: Self-pay | Admitting: *Deleted

## 2013-10-24 MED ORDER — INSULIN DETEMIR 100 UNIT/ML FLEXPEN
45.0000 [IU] | PEN_INJECTOR | Freq: Every day | SUBCUTANEOUS | Status: DC
Start: 2013-10-24 — End: 2014-03-03

## 2013-10-24 NOTE — Telephone Encounter (Signed)
Received call from patient wife, Delores.   Reports that patient insurance is not covering Lantus.   Received letter from [place]. Requested to change patient to Levemir.  MD made aware and new orders obtained to switch patient to Levemir.   Prescription sent to pharmacy.

## 2013-10-30 ENCOUNTER — Encounter: Payer: Self-pay | Admitting: Family Medicine

## 2013-10-30 ENCOUNTER — Ambulatory Visit (INDEPENDENT_AMBULATORY_CARE_PROVIDER_SITE_OTHER): Payer: Medicare HMO | Admitting: Family Medicine

## 2013-10-30 VITALS — BP 140/78 | HR 72 | Temp 97.9°F | Resp 16 | Ht 64.0 in | Wt 197.0 lb

## 2013-10-30 DIAGNOSIS — I10 Essential (primary) hypertension: Secondary | ICD-10-CM

## 2013-10-30 DIAGNOSIS — E1349 Other specified diabetes mellitus with other diabetic neurological complication: Secondary | ICD-10-CM

## 2013-10-30 DIAGNOSIS — E1149 Type 2 diabetes mellitus with other diabetic neurological complication: Secondary | ICD-10-CM

## 2013-10-30 DIAGNOSIS — Z23 Encounter for immunization: Secondary | ICD-10-CM

## 2013-10-30 NOTE — Assessment & Plan Note (Signed)
Well controlled 

## 2013-10-30 NOTE — Assessment & Plan Note (Addendum)
His blood sugars are significantly improved today based on his meter results. His A1c is due in about 4 weeks and he will obtain fasting labs at that time. I will continue him at the current dose of Lantus however he will be switched to Levemir due to insurance coverage he will continue the Humalog at 10 units with each meal continue metformin  Note she declines podiatry

## 2013-10-30 NOTE — Assessment & Plan Note (Signed)
Discussed the importance of medication adherence he will go back to the gabapentin 3 times a day but does not help would consider Lyrica or trying a topical cream compound

## 2013-10-30 NOTE — Progress Notes (Signed)
Patient ID: [name], male   DOB: [birth year], 63 y.o.   MRN: [id number]   Subjective:    Patient ID: [name], male    DOB: [birth year], 63 y.o.   MRN: [id number]  Patient presents for F/U and Diabetic Neuropathy  patient here for interim followup on his diabetes mellitus his A1c is uncontrolled at 11%, is been taking Lantus 45 units and Humalog 10 units with his meals. He has his blood sugar meter with him today which shows, a 14 day average of 163 his fasting this morning was 199 his blood sugars have ranged from 115 to 200. He states that he is taking his medication as prescribed as well as oral medications. She's not had any hypoglycemia no polyuria polydipsia. Over the past week he has had some increased burning sensation in his feet and tingling he has known diabetic neuropathy. He's not taking his gabapentin on a regular basis     Review Of Systems:  GEN- denies fatigue, fever, weight loss,weakness, recent illness HEENT- denies eye drainage, change in vision, nasal discharge, CVS- denies chest pain, palpitations RESP- denies SOB, cough, wheeze ABD- denies N/V, change in stools, abd pain GU- denies dysuria, hematuria, dribbling, incontinence MSK- +joint pain, muscle aches, injury Neuro- denies headache, dizziness, syncope, seizure activity       Objective:    BP 140/78  Pulse 72  Temp(Src) 97.9 F (36.6 C) (Oral)  Resp 16  Ht  (1.626 m)  Wt 197 lb (89.359 kg)  BMI 33.80 kg/m2 GEN- NAD, alert and oriented x3 HEENT- PERRL, EOMI, non injected sclera, pink conjunctiva, MMM, oropharynx clear CVS- RRR, no murmur RESP-CTAB Neuro- decreased monofilament in feet bilat- all areas noted, long thick nails EXT- No edema Pulses- Radial, DP- 2+        Assessment & Plan:      Problem List Items Addressed This Visit   Type II or unspecified type diabetes mellitus with neurological manifestations, 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 Any transcriptional errors that result from this process are unintentional.

## 2013-10-30 NOTE — Patient Instructions (Signed)
New Insulin Levemir- take 45 units Continue Humalog 10 units with each meal Take the capsule- neurontin three times a day for diabetic neurapthy Flu shot given F/U 8 weeks in office

## 2013-11-15 ENCOUNTER — Telehealth: Payer: Self-pay | Admitting: Family Medicine

## 2013-11-15 NOTE — Telephone Encounter (Signed)
[facility] calling to check to see if we got fax for diabetic testing supplies for this patient  [phone]

## 2013-11-18 ENCOUNTER — Other Ambulatory Visit (HOSPITAL_COMMUNITY): Payer: Self-pay | Admitting: *Deleted

## 2013-11-18 DIAGNOSIS — I6523 Occlusion and stenosis of bilateral carotid arteries: Secondary | ICD-10-CM

## 2013-11-18 NOTE — Telephone Encounter (Signed)
[facility]   [facility]   Call was placed to patient on Wednesday, 11/13/2013 after receiving fax.   Reports that he did not request diabetic supplies from company, and he is getting his supplies from [employer].   Prescription denied.

## 2013-11-19 ENCOUNTER — Ambulatory Visit (HOSPITAL_COMMUNITY): Payer: Medicare HMO | Attending: Family Medicine | Admitting: Radiology

## 2013-11-19 DIAGNOSIS — I739 Peripheral vascular disease, unspecified: Secondary | ICD-10-CM | POA: Diagnosis not present

## 2013-11-19 DIAGNOSIS — J449 Chronic obstructive pulmonary disease, unspecified: Secondary | ICD-10-CM | POA: Diagnosis not present

## 2013-11-19 DIAGNOSIS — I2510 Atherosclerotic heart disease of native coronary artery without angina pectoris: Secondary | ICD-10-CM | POA: Diagnosis present

## 2013-11-19 DIAGNOSIS — Z87891 Personal history of nicotine dependence: Secondary | ICD-10-CM | POA: Diagnosis not present

## 2013-11-19 DIAGNOSIS — I10 Essential (primary) hypertension: Secondary | ICD-10-CM | POA: Insufficient documentation

## 2013-11-19 DIAGNOSIS — E785 Hyperlipidemia, unspecified: Secondary | ICD-10-CM | POA: Diagnosis not present

## 2013-11-19 DIAGNOSIS — E119 Type 2 diabetes mellitus without complications: Secondary | ICD-10-CM | POA: Insufficient documentation

## 2013-11-19 DIAGNOSIS — I6523 Occlusion and stenosis of bilateral carotid arteries: Secondary | ICD-10-CM

## 2013-11-19 NOTE — Progress Notes (Signed)
Carotid Duplex performed. 

## 2013-11-20 NOTE — Telephone Encounter (Signed)
No return call noted.  

## 2013-11-26 ENCOUNTER — Telehealth: Payer: Self-pay | Admitting: Family Medicine

## 2013-11-26 NOTE — Telephone Encounter (Signed)
Patient wants to speak to nurse regarding his blockage he said  [phone]

## 2013-11-26 NOTE — Telephone Encounter (Signed)
Call placed to patient.   Requested more information about Carotid Duplex results.   Results discussed with patient.   Information about heart healthy diet mailed to patient per patient request.   Also reports that lab orders were misplaced.   Copy of orders mailed to patient.

## 2013-12-03 LAB — COMPREHENSIVE METABOLIC PANEL
ALK PHOS: 51 U/L (ref 39–117)
ALT: 13 U/L (ref 0–53)
AST: 13 U/L (ref 0–37)
Albumin: 3.8 g/dL (ref 3.5–5.2)
BILIRUBIN TOTAL: 0.4 mg/dL (ref 0.2–1.2)
BUN: 18 mg/dL (ref 6–23)
CO2: 29 mEq/L (ref 19–32)
Calcium: 9.4 mg/dL (ref 8.4–10.5)
Chloride: 104 mEq/L (ref 96–112)
Creat: 1.26 mg/dL (ref 0.50–1.35)
Glucose, Bld: 219 mg/dL — ABNORMAL HIGH (ref 70–99)
Potassium: 4.5 mEq/L (ref 3.5–5.3)
SODIUM: 140 meq/L (ref 135–145)
Total Protein: 6.6 g/dL (ref 6.0–8.3)

## 2013-12-03 LAB — CBC WITH DIFFERENTIAL/PLATELET
BASOS PCT: 1 % (ref 0–1)
Basophils Absolute: 0 10*3/uL (ref 0.0–0.1)
EOS ABS: 0.2 10*3/uL (ref 0.0–0.7)
Eosinophils Relative: 4 % (ref 0–5)
HCT: 36.6 % — ABNORMAL LOW (ref 39.0–52.0)
Hemoglobin: 11.8 g/dL — ABNORMAL LOW (ref 13.0–17.0)
Lymphocytes Relative: 39 % (ref 12–46)
Lymphs Abs: 1.9 10*3/uL (ref 0.7–4.0)
MCH: 27.4 pg (ref 26.0–34.0)
MCHC: 32.2 g/dL (ref 30.0–36.0)
MCV: 84.9 fL (ref 78.0–100.0)
Monocytes Absolute: 0.3 10*3/uL (ref 0.1–1.0)
Monocytes Relative: 7 % (ref 3–12)
NEUTROS PCT: 49 % (ref 43–77)
Neutro Abs: 2.4 10*3/uL (ref 1.7–7.7)
PLATELETS: 231 10*3/uL (ref 150–400)
RBC: 4.31 MIL/uL (ref 4.22–5.81)
RDW: 12.8 % (ref 11.5–15.5)
WBC: 4.8 10*3/uL (ref 4.0–10.5)

## 2013-12-03 LAB — HEMOGLOBIN A1C
Hgb A1c MFr Bld: 10.1 % — ABNORMAL HIGH (ref <5.7)
MEAN PLASMA GLUCOSE: 243 mg/dL — AB (ref <117)

## 2013-12-03 LAB — LIPID PANEL
CHOL/HDL RATIO: 5.4 ratio
Cholesterol: 125 mg/dL (ref 0–200)
HDL: 23 mg/dL — AB (ref >39)
LDL CALC: 39 mg/dL (ref 0–99)
Triglycerides: 317 mg/dL — ABNORMAL HIGH (ref <150)
VLDL: 63 mg/dL — ABNORMAL HIGH (ref 0–40)

## 2013-12-23 ENCOUNTER — Ambulatory Visit: Payer: Medicare HMO | Admitting: Family Medicine

## 2013-12-31 ENCOUNTER — Emergency Department (HOSPITAL_COMMUNITY): Payer: Medicare HMO

## 2013-12-31 ENCOUNTER — Inpatient Hospital Stay (HOSPITAL_COMMUNITY): Payer: Medicare HMO

## 2013-12-31 ENCOUNTER — Encounter (HOSPITAL_COMMUNITY): Payer: Self-pay

## 2013-12-31 ENCOUNTER — Inpatient Hospital Stay (HOSPITAL_COMMUNITY)
Admission: EM | Admit: 2013-12-31 | Discharge: 2014-01-07 | DRG: 269 | Disposition: A | Discharge status: Skilled Nursing Facility | Payer: Medicare HMO | Attending: Internal Medicine | Admitting: Internal Medicine

## 2013-12-31 DIAGNOSIS — N400 Enlarged prostate without lower urinary tract symptoms: Secondary | ICD-10-CM | POA: Diagnosis present

## 2013-12-31 DIAGNOSIS — E785 Hyperlipidemia, unspecified: Secondary | ICD-10-CM | POA: Diagnosis present

## 2013-12-31 DIAGNOSIS — R202 Paresthesia of skin: Secondary | ICD-10-CM

## 2013-12-31 DIAGNOSIS — Z794 Long term (current) use of insulin: Secondary | ICD-10-CM | POA: Diagnosis not present

## 2013-12-31 DIAGNOSIS — R200 Anesthesia of skin: Secondary | ICD-10-CM | POA: Diagnosis present

## 2013-12-31 DIAGNOSIS — I743 Embolism and thrombosis of arteries of the lower extremities: Secondary | ICD-10-CM | POA: Diagnosis present

## 2013-12-31 DIAGNOSIS — K5900 Constipation, unspecified: Secondary | ICD-10-CM | POA: Diagnosis not present

## 2013-12-31 DIAGNOSIS — M79609 Pain in unspecified limb: Secondary | ICD-10-CM

## 2013-12-31 DIAGNOSIS — E1140 Type 2 diabetes mellitus with diabetic neuropathy, unspecified: Secondary | ICD-10-CM | POA: Diagnosis present

## 2013-12-31 DIAGNOSIS — Z87891 Personal history of nicotine dependence: Secondary | ICD-10-CM | POA: Diagnosis not present

## 2013-12-31 DIAGNOSIS — I129 Hypertensive chronic kidney disease with stage 1 through stage 4 chronic kidney disease, or unspecified chronic kidney disease: Secondary | ICD-10-CM | POA: Diagnosis present

## 2013-12-31 DIAGNOSIS — T82868A Thrombosis of vascular prosthetic devices, implants and grafts, initial encounter: Principal | ICD-10-CM | POA: Diagnosis present

## 2013-12-31 DIAGNOSIS — I998 Other disorder of circulatory system: Secondary | ICD-10-CM

## 2013-12-31 DIAGNOSIS — N179 Acute kidney failure, unspecified: Secondary | ICD-10-CM | POA: Diagnosis not present

## 2013-12-31 DIAGNOSIS — F329 Major depressive disorder, single episode, unspecified: Secondary | ICD-10-CM | POA: Diagnosis present

## 2013-12-31 DIAGNOSIS — N189 Chronic kidney disease, unspecified: Secondary | ICD-10-CM | POA: Diagnosis present

## 2013-12-31 DIAGNOSIS — I70422 Atherosclerosis of autologous vein bypass graft(s) of the extremities with rest pain, left leg: Secondary | ICD-10-CM | POA: Diagnosis not present

## 2013-12-31 DIAGNOSIS — I679 Cerebrovascular disease, unspecified: Secondary | ICD-10-CM

## 2013-12-31 DIAGNOSIS — R52 Pain, unspecified: Secondary | ICD-10-CM

## 2013-12-31 DIAGNOSIS — I10 Essential (primary) hypertension: Secondary | ICD-10-CM

## 2013-12-31 DIAGNOSIS — Z7982 Long term (current) use of aspirin: Secondary | ICD-10-CM | POA: Diagnosis not present

## 2013-12-31 DIAGNOSIS — Z79899 Other long term (current) drug therapy: Secondary | ICD-10-CM

## 2013-12-31 DIAGNOSIS — I495 Sick sinus syndrome: Secondary | ICD-10-CM

## 2013-12-31 DIAGNOSIS — I739 Peripheral vascular disease, unspecified: Secondary | ICD-10-CM | POA: Diagnosis present

## 2013-12-31 DIAGNOSIS — G4733 Obstructive sleep apnea (adult) (pediatric): Secondary | ICD-10-CM | POA: Diagnosis present

## 2013-12-31 DIAGNOSIS — K219 Gastro-esophageal reflux disease without esophagitis: Secondary | ICD-10-CM | POA: Diagnosis present

## 2013-12-31 DIAGNOSIS — Z950 Presence of cardiac pacemaker: Secondary | ICD-10-CM | POA: Diagnosis not present

## 2013-12-31 DIAGNOSIS — Z419 Encounter for procedure for purposes other than remedying health state, unspecified: Secondary | ICD-10-CM

## 2013-12-31 DIAGNOSIS — D62 Acute posthemorrhagic anemia: Secondary | ICD-10-CM | POA: Diagnosis not present

## 2013-12-31 DIAGNOSIS — E1149 Type 2 diabetes mellitus with other diabetic neurological complication: Secondary | ICD-10-CM

## 2013-12-31 DIAGNOSIS — I2510 Atherosclerotic heart disease of native coronary artery without angina pectoris: Secondary | ICD-10-CM | POA: Diagnosis present

## 2013-12-31 DIAGNOSIS — Z951 Presence of aortocoronary bypass graft: Secondary | ICD-10-CM | POA: Diagnosis not present

## 2013-12-31 DIAGNOSIS — R609 Edema, unspecified: Secondary | ICD-10-CM

## 2013-12-31 DIAGNOSIS — M7989 Other specified soft tissue disorders: Secondary | ICD-10-CM

## 2013-12-31 LAB — CBC WITH DIFFERENTIAL/PLATELET
BASOS ABS: 0 10*3/uL (ref 0.0–0.1)
Basophils Relative: 0 % (ref 0–1)
EOS PCT: 1 % (ref 0–5)
Eosinophils Absolute: 0.1 10*3/uL (ref 0.0–0.7)
HCT: 38.7 % — ABNORMAL LOW (ref 39.0–52.0)
HEMOGLOBIN: 12.7 g/dL — AB (ref 13.0–17.0)
Lymphocytes Relative: 17 % (ref 12–46)
Lymphs Abs: 1.3 10*3/uL (ref 0.7–4.0)
MCH: 27.3 pg (ref 26.0–34.0)
MCHC: 32.8 g/dL (ref 30.0–36.0)
MCV: 83.2 fL (ref 78.0–100.0)
Monocytes Absolute: 0.4 10*3/uL (ref 0.1–1.0)
Monocytes Relative: 6 % (ref 3–12)
Neutro Abs: 5.7 10*3/uL (ref 1.7–7.7)
Neutrophils Relative %: 76 % (ref 43–77)
Platelets: 207 10*3/uL (ref 150–400)
RBC: 4.65 MIL/uL (ref 4.22–5.81)
RDW: 12.2 % (ref 11.5–15.5)
WBC: 7.6 10*3/uL (ref 4.0–10.5)

## 2013-12-31 LAB — BASIC METABOLIC PANEL
Anion gap: 13 (ref 5–15)
BUN: 25 mg/dL — ABNORMAL HIGH (ref 6–23)
CALCIUM: 10.2 mg/dL (ref 8.4–10.5)
CO2: 29 meq/L (ref 19–32)
CREATININE: 1.28 mg/dL (ref 0.50–1.35)
Chloride: 97 mEq/L (ref 96–112)
GFR calc Af Amer: 67 mL/min — ABNORMAL LOW (ref >90)
GFR calc non Af Amer: 58 mL/min — ABNORMAL LOW (ref >90)
Glucose, Bld: 333 mg/dL — ABNORMAL HIGH (ref 70–99)
Potassium: 4.4 mEq/L (ref 3.7–5.3)
Sodium: 139 mEq/L (ref 137–147)

## 2013-12-31 LAB — PROTIME-INR
INR: 1.01 (ref 0.00–1.49)
Prothrombin Time: 13.4 seconds (ref 11.6–15.2)

## 2013-12-31 LAB — APTT: aPTT: 28 seconds (ref 24–37)

## 2013-12-31 LAB — TROPONIN I: Troponin I: <0.3 ng/mL (ref <0.30)

## 2013-12-31 LAB — CK: CK TOTAL: 214 U/L (ref 7–232)

## 2013-12-31 LAB — CBG MONITORING, ED: Glucose-Capillary: 240 mg/dL — ABNORMAL HIGH (ref 70–99)

## 2013-12-31 MED ORDER — INSULIN ASPART 100 UNIT/ML ~~LOC~~ SOLN
10.0000 [IU] | Freq: Three times a day (TID) | SUBCUTANEOUS | Status: DC
  Administered 2014-01-01 – 2014-01-07 (×10): 10 [IU] via SUBCUTANEOUS

## 2013-12-31 MED ORDER — HYDROCHLOROTHIAZIDE 12.5 MG PO CAPS
12.5000 mg | ORAL_CAPSULE | Freq: Once | ORAL | Status: AC
  Administered 2013-12-31: 12.5 mg via ORAL
  Filled 2013-12-31: qty 1

## 2013-12-31 MED ORDER — ASPIRIN 81 MG PO TABS: 81.0000 mg | ORAL_TABLET | Freq: Every day | ORAL | Status: DC

## 2013-12-31 MED ORDER — LISINOPRIL 20 MG PO TABS
20.0000 mg | ORAL_TABLET | Freq: Once | ORAL | Status: AC
  Administered 2013-12-31: 20 mg via ORAL
  Filled 2013-12-31: qty 1

## 2013-12-31 MED ORDER — SODIUM CHLORIDE 0.9 % IV SOLN
INTRAVENOUS | Status: DC
  Administered 2014-01-01 – 2014-01-05 (×7): via INTRAVENOUS

## 2013-12-31 MED ORDER — ALBUTEROL SULFATE HFA 108 (90 BASE) MCG/ACT IN AERS: 2.0000 | INHALATION_SPRAY | RESPIRATORY_TRACT | Status: DC | PRN

## 2013-12-31 MED ORDER — INSULIN DETEMIR 100 UNIT/ML FLEXPEN: 45.0000 [IU] | PEN_INJECTOR | Freq: Every day | SUBCUTANEOUS | Status: DC

## 2013-12-31 MED ORDER — HYDRALAZINE HCL 20 MG/ML IJ SOLN: 10.0000 mg | INTRAMUSCULAR | Status: DC | PRN

## 2013-12-31 MED ORDER — ACETAMINOPHEN 325 MG PO TABS
650.0000 mg | ORAL_TABLET | Freq: Four times a day (QID) | ORAL | Status: DC | PRN
  Administered 2014-01-01 – 2014-01-02 (×5): 650 mg via ORAL
  Filled 2013-12-31 (×6): qty 2

## 2013-12-31 MED ORDER — METOPROLOL TARTRATE 25 MG PO TABS
25.0000 mg | ORAL_TABLET | Freq: Two times a day (BID) | ORAL | Status: DC
  Administered 2014-01-01 – 2014-01-02 (×5): 25 mg via ORAL
  Filled 2013-12-31 (×8): qty 1

## 2013-12-31 MED ORDER — ONDANSETRON HCL 4 MG/2ML IJ SOLN: 4.0000 mg | Freq: Four times a day (QID) | INTRAMUSCULAR | Status: DC | PRN

## 2013-12-31 MED ORDER — INSULIN DETEMIR 100 UNIT/ML ~~LOC~~ SOLN
20.0000 [IU] | Freq: Once | SUBCUTANEOUS | Status: AC
  Administered 2014-01-01: 20 [IU] via SUBCUTANEOUS
  Filled 2013-12-31: qty 0.2

## 2013-12-31 MED ORDER — ACETAMINOPHEN 650 MG RE SUPP: 650.0000 mg | Freq: Four times a day (QID) | RECTAL | Status: DC | PRN

## 2013-12-31 MED ORDER — SODIUM CHLORIDE 0.9 % IJ SOLN
3.0000 mL | Freq: Two times a day (BID) | INTRAMUSCULAR | Status: DC
  Administered 2013-12-31 – 2014-01-05 (×4): 3 mL via INTRAVENOUS

## 2013-12-31 MED ORDER — INSULIN ASPART 100 UNIT/ML ~~LOC~~ SOLN
0.0000 [IU] | Freq: Four times a day (QID) | SUBCUTANEOUS | Status: DC
  Administered 2014-01-01: 11 [IU] via SUBCUTANEOUS

## 2013-12-31 MED ORDER — ONDANSETRON HCL 4 MG PO TABS: 4.0000 mg | ORAL_TABLET | Freq: Four times a day (QID) | ORAL | Status: DC | PRN

## 2013-12-31 MED ORDER — HEPARIN (PORCINE) IN NACL 100-0.45 UNIT/ML-% IJ SOLN
1300.0000 [IU]/h | INTRAMUSCULAR | Status: DC
  Administered 2013-12-31 – 2014-01-01 (×2): 1300 [IU]/h via INTRAVENOUS
  Filled 2013-12-31 (×3): qty 250

## 2013-12-31 MED ORDER — HEPARIN BOLUS VIA INFUSION
4000.0000 [IU] | Freq: Once | INTRAVENOUS | Status: AC
  Administered 2013-12-31: 4000 [IU] via INTRAVENOUS
  Filled 2013-12-31: qty 4000

## 2013-12-31 MED ORDER — GABAPENTIN 300 MG PO CAPS
300.0000 mg | ORAL_CAPSULE | Freq: Three times a day (TID) | ORAL | Status: DC
  Administered 2014-01-01 – 2014-01-07 (×18): 300 mg via ORAL
  Filled 2013-12-31 (×26): qty 1

## 2013-12-31 MED ORDER — ALBUTEROL SULFATE (2.5 MG/3ML) 0.083% IN NEBU: 2.5000 mg | INHALATION_SOLUTION | RESPIRATORY_TRACT | Status: DC | PRN

## 2013-12-31 MED ORDER — ASPIRIN EC 81 MG PO TBEC
81.0000 mg | DELAYED_RELEASE_TABLET | Freq: Every day | ORAL | Status: DC
  Administered 2014-01-01 (×2): 81 mg via ORAL
  Filled 2013-12-31 (×4): qty 1

## 2013-12-31 MED ORDER — SIMVASTATIN 10 MG PO TABS
10.0000 mg | ORAL_TABLET | Freq: Every day | ORAL | Status: DC
  Administered 2014-01-01 – 2014-01-06 (×7): 10 mg via ORAL
  Filled 2013-12-31 (×11): qty 1

## 2013-12-31 MED ORDER — INSULIN DETEMIR 100 UNIT/ML ~~LOC~~ SOLN
45.0000 [IU] | Freq: Every day | SUBCUTANEOUS | Status: DC
  Administered 2014-01-01 – 2014-01-07 (×5): 45 [IU] via SUBCUTANEOUS
  Filled 2013-12-31 (×7): qty 0.45

## 2013-12-31 NOTE — ED Notes (Signed)
Pt complain of pain in left leg. States he thinks he may have a clot. States he had one in the past but he doesn't know where it was or if he was on any medication

## 2013-12-31 NOTE — ED Provider Notes (Signed)
CSN: [id number]     Arrival date & time 12/31/13  1236 History  This chart was scribed for Ward GivensIva L Robyne Matar, MD by Tonye RoyaltyJoshua Chen, ED Scribe. This patient was seen in room APAH2/APAH2 and the patient's care was started at 1:06 PM.    Chief Complaint  Patient presents with  . Leg Pain   The history is provided by the patient. No language interpreter was used.    HPI Comments: [name] is a 63 y.o. male who presents to the Emergency Department complaining of left lower leg pain with onset this morning. He reports associated swelling and numbness to the medial side. He states he did not notice swelling or other symptoms yesterday. He denies pain to his feet or toes. He denies acute wounds to his feet. He states he feels dizzy. He states he ambulates without a cane normally, but has difficulty walking today due to numbness and feeling weak. He denies fever, chills, chest pain, or SOB. He reports prior arterial clot to his left leg 10 years ago that was removed with a catheter. He states he was not hospitalized nor prescribed blood thinners. He reports prior right carotid endarterectomy. He denies smoking.  [facility]  Past Medical History  Diagnosis Date  . Arteriosclerotic cardiovascular disease (ASCVD)     CABG in 2003 for 3 VD; 07/2007: Stress nuclear-inferior infarction without ischemia; normal EF by echo in 06/2005  . Diabetes mellitus type II   . Hyperlipidemia   . Degenerative joint disease     Left knee  . Cerebrovascular disease 2003    Carotid ultrasound in 2010: Patent right CEA site, 60-79% left internal carotid artery  . Peripheral vascular disease 2007    aortobifemoral BPG-2007  . Anemia   . Peripheral neuropathy   . Hypertension   . Sick sinus syndrome 1996    permanent pacemaker-1996; generator change in 2010  . Chronic obstructive pulmonary disease   . Obesity   . Obstructive sleep apnea   . Benign prostatic hypertrophy   . Gastroesophageal reflux disease    . Tobacco abuse, in remission     50 pack years total consumption; Quit in 2007   Past Surgical History  Procedure Laterality Date  . Coronary artery bypass graft  2003    three-vessel disease  . Iliac artery stent      Percutaneous intervention-right external iliac; unclear whether stent was placed  . Pacemaker insertion  1996    generator change in 2010  . Carotid endarterectomy  2003    Right  . Pacemaker insertion      Generator replaced in 04/2008  . Aorta - bilateral femoral artery bypass graft  2007  . Femoral-popliteal bypass graft  1998    bilateral  . Colonoscopy  2012    Negative screening study.  . Ileocolonoscopy  02/20/2007    OZH:YQMVHQRMR:Normal rectum, colon, and terminal ileum   Family History  Problem Relation Age of Onset  . Dementia Mother   . Heart attack Father   . Coronary artery disease Father   . Peripheral vascular disease Sister   . Diabetes Brother   . Peripheral vascular disease Brother   . Hypertension Brother   . Coronary artery disease Brother    History  Substance Use Topics  . Smoking status: Former Smoker -- 2.00 packs/day for 25 years    Types: Cigarettes  . Smokeless tobacco: Former NeurosurgeonUser    Quit date: 02/16/1996  . Alcohol Use:  No  lives at home Lives with spouse  Review of Systems  Constitutional: Negative for fever and chills.  Respiratory: Negative for shortness of breath.   Cardiovascular: Positive for leg swelling (and pain). Negative for chest pain.  Neurological: Positive for dizziness, weakness and numbness.  All other systems reviewed and are negative.     Allergies  Review of patient's allergies indicates no known allergies.  Home Medications   Prior to Admission medications   Medication Sig Start Date End Date Taking? Authorizing Provider  acetaminophen (TYLENOL) 500 MG tablet Take 2 tablets (1,000 mg total) by mouth every 6 (six) hours as needed. Pain. 05/08/13   Salley Scarlet, MD  albuterol (PROVENTIL  HFA;VENTOLIN HFA) 108 (90 BASE) MCG/ACT inhaler Inhale 2 puffs into the lungs every 4 (four) hours as needed. 06/21/13   Salley Scarlet, MD  aspirin 81 MG tablet Take 1 tablet (81 mg total) by mouth daily. 05/08/13   Salley Scarlet, MD  BAYER CONTOUR NEXT TEST test strip TEST THREE TIMES A DAY 08/07/13   Salley Scarlet, MD  gabapentin (NEURONTIN) 300 MG capsule Take 1 capsule (300 mg total) by mouth 3 (three) times daily. 06/21/13   Salley Scarlet, MD  Insulin Detemir (LEVEMIR FLEXTOUCH) 100 UNIT/ML Pen Inject 45 Units into the skin daily at 10 pm. 10/24/13   Salley Scarlet, MD  insulin lispro (HUMALOG) 100 UNIT/ML KiwkPen Inject 12 Units into the skin 3 (three) times daily with meals. 06/24/13   Salley Scarlet, MD  lisinopril-hydrochlorothiazide (PRINZIDE,ZESTORETIC) 20-12.5 MG per tablet Take 2 tablets by mouth daily. 06/21/13   Salley Scarlet, MD  metFORMIN (GLUCOPHAGE) 1000 MG tablet Take 1 tablet (1,000 mg total) by mouth 2 (two) times daily with a meal. 06/21/13   Salley Scarlet, MD  metoprolol tartrate (LOPRESSOR) 25 MG tablet Take 1 tablet (25 mg total) by mouth 2 (two) times daily. 06/21/13   Salley Scarlet, MD  simvastatin (ZOCOR) 10 MG tablet Take 1 tablet (10 mg total) by mouth at bedtime. 06/21/13   Salley Scarlet, MD  triamcinolone cream (KENALOG) 0.1 % Apply 1 application topically 2 (two) times daily. Apply to leg 08/28/13   Salley Scarlet, MD   BP 171/72 mmHg  Pulse 68  Temp(Src) 97.5 F (36.4 C) (Oral)  Resp 18  Ht 5\' 4"  (1.626 m)  Wt 210 lb (95.255 kg)  BMI 36.03 kg/m2  SpO2 97%  Vital signs normal   Physical Exam  Constitutional: He is oriented to person, place, and time. He appears well-developed and well-nourished.  Non-toxic appearance. He does not appear ill. No distress.  HENT:  Head: Normocephalic and atraumatic.  Right Ear: External ear normal.  Left Ear: External ear normal.  Nose: Nose normal. No mucosal edema or rhinorrhea.  Mouth/Throat: Oropharynx  is clear and moist and mucous membranes are normal. No dental abscesses or uvula swelling.  Eyes: Conjunctivae and EOM are normal. Pupils are equal, round, and reactive to light.  Neck: Normal range of motion and full passive range of motion without pain. Neck supple.  Cardiovascular: Normal rate, regular rhythm and normal heart sounds.  Exam reveals no gallop and no friction rub.   No murmur heard. Pulmonary/Chest: Effort normal and breath sounds normal. No respiratory distress. He has no wheezes. He has no rhonchi. He has no rales. He exhibits no tenderness and no crepitus.  Abdominal: Soft. Normal appearance and bowel sounds are normal. He exhibits no distension. There  is no tenderness. There is no rebound and no guarding.  Musculoskeletal: Normal range of motion. He exhibits no edema or tenderness.  Moves all extremities well. Patient has mild swelling of his left lower extremity below the knee. It is nonpitting.Both legs cool to touch. There is no discoloration of his toes. There is some prolongation of capillary refill in left foot.  Neurological: He is alert and oriented to person, place, and time. He has normal strength. No cranial nerve deficit.  Skin: Skin is warm, dry and intact. No rash noted. No erythema. No pallor.  Psychiatric: He has a normal mood and affect. His speech is normal and behavior is normal. His mood appears not anxious.  Nursing note and vitals reviewed.   ED Course  Procedures (including critical care time)  DIAGNOSTIC STUDIES: Oxygen Saturation is 97% on room air, normal by my interpretation.    COORDINATION OF CARE: 1:12 PM Discussed treatment plan with patient at beside, the patient agrees with the plan and has no further questions at this time.  Review of patient's old records shows he had bypass surgery done in 2003. He had a right carotid endarterectomy in 2003. He was seen by Dr. Dorris Fetch in August 2003. Patient has had aortofemoral bypass surgery done  in May 2007, and he also had bilateral femoropopliteal bypass done. He also had intervention for obstruction in his right vascular system of his lower extremity.  15:52 Dr Edilia Bo wants pt to come to [facility] ED tonight  15:59 Shanda Bumps, charge nurse at [facility] ED informed of transfer to see Dr Edilia Bo  16:00 Dr Blinda Leatherwood, ED physician notified of transfer  Labs Review Results for orders placed or performed during the hospital encounter of 12/31/13  Basic metabolic panel  Result Value Ref Range   Sodium 139 137 - 147 mEq/L   Potassium 4.4 3.7 - 5.3 mEq/L   Chloride 97 96 - 112 mEq/L   CO2 29 19 - 32 mEq/L   Glucose, Bld 333 (H) 70 - 99 mg/dL   BUN 25 (H) 6 - 23 mg/dL   Creatinine, Ser 1.61 0.50 - 1.35 mg/dL   Calcium 09.6 8.4 - 04.5 mg/dL   GFR calc non Af Amer 58 (L) >90 mL/min   GFR calc Af Amer 67 (L) >90 mL/min   Anion gap 13 5 - 15  CBC with Differential  Result Value Ref Range   WBC 7.6 4.0 - 10.5 K/uL   RBC 4.65 4.22 - 5.81 MIL/uL   Hemoglobin 12.7 (L) 13.0 - 17.0 g/dL   HCT 40.9 (L) 81.1 - 91.4 %   MCV 83.2 78.0 - 100.0 fL   MCH 27.3 26.0 - 34.0 pg   MCHC 32.8 30.0 - 36.0 g/dL   RDW 78.2 95.6 - 21.3 %   Platelets 207 150 - 400 K/uL   Neutrophils Relative % 76 43 - 77 %   Neutro Abs 5.7 1.7 - 7.7 K/uL   Lymphocytes Relative 17 12 - 46 %   Lymphs Abs 1.3 0.7 - 4.0 K/uL   Monocytes Relative 6 3 - 12 %   Monocytes Absolute 0.4 0.1 - 1.0 K/uL   Eosinophils Relative 1 0 - 5 %   Eosinophils Absolute 0.1 0.0 - 0.7 K/uL   Basophils Relative 0 0 - 1 %   Basophils Absolute 0.0 0.0 - 0.1 K/uL  Protime-INR  Result Value Ref Range   Prothrombin Time 13.4 11.6 - 15.2 seconds   INR 1.01 0.00 - 1.49  APTT  Result Value Ref Range   aPTT 28 24 - 37 seconds    Laboratory interpretation all normal except mild anemia, hyperglycemia     Imaging Review [place] Venous Img Lower Unilateral Left  12/31/2013   CLINICAL DATA:  63 year old male with left lower extremity pain, swelling and numbness   EXAM: LEFT LOWER EXTREMITY VENOUS DOPPLER ULTRASOUND  TECHNIQUE: Gray-scale sonography with graded compression, as well as color Doppler and duplex ultrasound were performed to evaluate the lower extremity deep venous systems from the level of the common femoral vein and including the common femoral, femoral, profunda femoral, popliteal and calf veins including the posterior tibial, peroneal and gastrocnemius veins when visible. The superficial great saphenous vein was also interrogated. Spectral Doppler was utilized to evaluate flow at rest and with distal augmentation maneuvers in the common femoral, femoral and popliteal veins.  COMPARISON:  Prior duplex venous ultrasound of the left lower extremity 01/20/2012  FINDINGS: Contralateral Common Femoral Vein: Respiratory phasicity is normal and symmetric with the symptomatic side. No evidence of thrombus. Normal compressibility.  Common Femoral Vein: No evidence of thrombus. Normal compressibility, respiratory phasicity and response to augmentation.  Saphenofemoral Junction: No evidence of thrombus. Normal compressibility and flow on color Doppler imaging.  Profunda Femoral Vein: No evidence of thrombus. Normal compressibility and flow on color Doppler imaging.  Femoral Vein: No evidence of thrombus. Normal compressibility, respiratory phasicity and response to augmentation.  Popliteal Vein: No evidence of thrombus. Normal compressibility, respiratory phasicity and response to augmentation.  Calf Veins: No evidence of thrombus. Normal compressibility and flow on color Doppler imaging.  Superficial Great Saphenous Vein: No evidence of thrombus. Normal compressibility and flow on color Doppler imaging.  Venous Reflux:  None.  Other Findings:  None.  IMPRESSION: No evidence of deep venous thrombosis.   Electronically Signed   By: Malachy MoanHeath  McCullough M.D.   On: 12/31/2013 15:09   [place] Arterial Seg Single  12/31/2013   CLINICAL DATA:  Left leg pain, swelling and numbness.  History of aortobifemoral graft. History of lower extremity grafts.  EXAM: NONINVASIVE PHYSIOLOGIC VASCULAR STUDY OF BILATERAL LOWER EXTREMITIES  TECHNIQUE: Evaluation of both lower extremities were performed at rest, including calculation of ankle-brachial indices with single level Doppler, pressure and pulse volume recording.  COMPARISON:  11/24/2011  FINDINGS: Right ABI: The right ankle arteries are not compressible and unable to obtain ankle-brachial indices.  Left ABI:  0.88  Right Lower Extremity: Irregular Doppler waveforms throughout the right ankle.  Left Lower Extremity: Irregular Doppler waveforms throughout the left ankle. The ABI in the posterior tibial artery is 0.88 and 0.15 from the dorsalis pedis artery.  IMPRESSION: Right ankle-brachial index could not be obtained due to non compressible vessels. Irregular waveforms in the right ankle are similar to the previous examination. Previously, the right ABI was 0.56 and suspect severe peripheral vascular disease in the right lower extremity.  Left ABI is 0.88. This could be an over-estimation because it was previously 0.38. The patient may have had revascularization involving the posterior tibial artery to account for the improved ABI.   Electronically Signed   By: Richarda OverlieAdam  Henn M.D.   On: 12/31/2013 15:11     EKG Interpretation None      MDM   Final diagnoses:  Pain  Swelling  Numbness    [facility] to be evaluated by Dr Vallarie Mareickson   Montez Stryker, MD, FACEP   I personally performed the services described in this documentation, which was scribed in  my presence. The recorded information has been reviewed and considered. Devoria Albe, MD, Armando Gang    Ward Givens, MD 12/31/13 [phone]

## 2013-12-31 NOTE — ED Notes (Signed)
[facility] here via [facility], [facility] sent here for changes seen on doppler study of left foot

## 2013-12-31 NOTE — H&P (Signed)
[facility] History and Physical  Patient: [name]  WUJ:[id number]  DOB: [birth year]  DOS: the patient was seen and examined on 12/31/2013 PCP: Milinda Antis, MD  Chief Complaint: left leg pain  HPI: [name] is a 63 y.o. male with Past medical history of PVD, CAD, DM-2, HTN, ANEMIA, CKD, OSA, BPH, pacemaker implant. The patient presented with complaints of left leg pain. He mentions that he woke up with the symptoms in the morning the pain was started having this high and was radiating to his leg. While the day his pain was progressively worsening and therefore he decided to come to the hospital at [facility]. In any event he was found to have abnormal ABI and vascular surgery was consulted. Vascular surgery has followed up with the patient and recommend further workup and has requested the patient to be admitted to medicine service. At the time of my evaluation patient mentions the pain in the left leg has. He also mentions that he has chronic diabetic neuropathy and has been having on and off events of numbness in both his legs as well as hands since last many months. He denies any fall trauma or injury. He denies any speech difficulty or headache or vision difficulty. He denies any weakness of any particular site. He denies any chest pain or shortness of breath or cough. He mentions he is compliant with all his medications and there is no recent change in his medications. He is a former smoker  The patient is coming from home. And at his baseline independent for most of his ADL.  Review of Systems: as mentioned in the history of present illness.  A Comprehensive review of the other systems is negative.  Past Medical History  Diagnosis Date  . Arteriosclerotic cardiovascular disease (ASCVD)     CABG in 2003 for 3 VD; 07/2007: Stress nuclear-inferior infarction without ischemia; normal EF by echo in 06/2005  . Diabetes mellitus type II   . Hyperlipidemia   .  Degenerative joint disease     Left knee  . Cerebrovascular disease 2003    Carotid ultrasound in 2010: Patent right CEA site, 60-79% left internal carotid artery  . Peripheral vascular disease 2007    aortobifemoral BPG-2007  . Anemia   . Peripheral neuropathy   . Hypertension   . Sick sinus syndrome 1996    permanent pacemaker-1996; generator change in 2010  . Chronic obstructive pulmonary disease   . Obesity   . Obstructive sleep apnea   . Benign prostatic hypertrophy   . Gastroesophageal reflux disease   . Tobacco abuse, in remission     50 pack years total consumption; Quit in 2007   Past Surgical History  Procedure Laterality Date  . Coronary artery bypass graft  2003    three-vessel disease  . Iliac artery stent      Percutaneous intervention-right external iliac; unclear whether stent was placed  . Pacemaker insertion  1996    generator change in 2010  . Carotid endarterectomy  2003    Right  . Pacemaker insertion      Generator replaced in 04/2008  . Aorta - bilateral femoral artery bypass graft  2007  . Femoral-popliteal bypass graft  1998    bilateral  . Colonoscopy  2012    Negative screening study.  . Ileocolonoscopy  02/20/2007    NFA:OZHYQM rectum, colon, and terminal ileum   Social History:  reports that he has quit smoking. His smoking use included  Cigarettes. He has a 50 pack-year smoking history. He quit smokeless tobacco use about 17 years ago. He reports that he does not drink alcohol or use illicit drugs.  No Known Allergies  Family History  Problem Relation Age of Onset  . Dementia Mother   . Heart attack Father   . Coronary artery disease Father   . Peripheral vascular disease Sister   . Diabetes Brother   . Peripheral vascular disease Brother   . Hypertension Brother   . Coronary artery disease Brother     Prior to Admission medications   Medication Sig Start Date End Date Taking? Authorizing Provider  acetaminophen (TYLENOL) 500 MG  tablet Take 2 tablets (1,000 mg total) by mouth every 6 (six) hours as needed. Pain. 05/08/13  Yes Salley ScarletKawanta F [place], MD  albuterol (PROVENTIL HFA;VENTOLIN HFA) 108 (90 BASE) MCG/ACT inhaler Inhale 2 puffs into the lungs every 4 (four) hours as needed. 06/21/13  Yes Salley ScarletKawanta F [place], MD  aspirin 81 MG tablet Take 1 tablet (81 mg total) by mouth daily. 05/08/13  Yes Salley ScarletKawanta F [place], MD  BAYER CONTOUR NEXT TEST test strip TEST THREE TIMES A DAY 08/07/13  Yes Salley ScarletKawanta F [place], MD  Insulin Detemir (LEVEMIR FLEXTOUCH) 100 UNIT/ML Pen Inject 45 Units into the skin daily at 10 pm. 10/24/13  Yes Salley ScarletKawanta F [place], MD  insulin lispro (HUMALOG) 100 UNIT/ML KiwkPen Inject 12 Units into the skin 3 (three) times daily with meals. 06/24/13  Yes Salley ScarletKawanta F [place], MD  lisinopril-hydrochlorothiazide (PRINZIDE,ZESTORETIC) 20-12.5 MG per tablet Take 2 tablets by mouth daily. 06/21/13  Yes Salley ScarletKawanta F [place], MD  metFORMIN (GLUCOPHAGE) 1000 MG tablet Take 1 tablet (1,000 mg total) by mouth 2 (two) times daily with a meal. 06/21/13  Yes Salley ScarletKawanta F [place], MD  metoprolol tartrate (LOPRESSOR) 25 MG tablet Take 1 tablet (25 mg total) by mouth 2 (two) times daily. 06/21/13  Yes Salley ScarletKawanta F [place], MD  simvastatin (ZOCOR) 10 MG tablet Take 1 tablet (10 mg total) by mouth at bedtime. 06/21/13  Yes Salley ScarletKawanta F [place], MD  triamcinolone cream (KENALOG) 0.1 % Apply 1 application topically 2 (two) times daily. Apply to leg 08/28/13  Yes Salley ScarletKawanta F [place], MD  gabapentin (NEURONTIN) 300 MG capsule Take 1 capsule (300 mg total) by mouth 3 (three) times daily. Patient not taking: Reported on 12/31/2013 06/21/13   Salley ScarletKawanta F , MD    Physical Exam: Filed Vitals:   12/31/13 1930 12/31/13 1945 12/31/13 2041 12/31/13 2140  BP: 193/84 181/71 143/126 154/71  Pulse: 69 70 70 70  Temp:    98.4 F (36.9 C)  TempSrc:    Oral  Resp: 20 14 19 18   Height:    5\' 4"  (1.626 m)  Weight:    85.3 kg (188 lb 0.8 oz)  SpO2: 100% 97% 100% 98%    General: Alert,  Awake and Oriented to Time, Place and Person. Appear in mild distress Eyes: PERRL ENT: Oral Mucosa clear moist. Neck: no JVD Cardiovascular: S1 and S2 Present, no Murmur, Peripheral Pulses Present Respiratory: Bilateral Air entry equal and Decreased, Clear to Auscultation, noCrackles, no wheezes Abdomen: Bowel Sound present, Soft and non tender Skin: no Rash Extremities: trace Pedal edema, no calf tenderness Neurologic: Grossly no focal neuro deficit. Bilateral lower limb numbness  Labs on Admission:  CBC:  Recent Labs Lab 12/31/13 1339  WBC 7.6  NEUTROABS 5.7  HGB 12.7*  HCT 38.7*  MCV 83.2  PLT 207    CMP  Component Value Date/Time   NA 139 12/31/2013 1339   K 4.4 12/31/2013 1339   CL 97 12/31/2013 1339   CO2 29 12/31/2013 1339   GLUCOSE 333* 12/31/2013 1339   BUN 25* 12/31/2013 1339   CREATININE 1.28 12/31/2013 1339   CREATININE 1.26 12/03/2013 0848   CALCIUM 10.2 12/31/2013 1339   PROT 6.6 12/03/2013 0848   ALBUMIN 3.8 12/03/2013 0848   AST 13 12/03/2013 0848   ALT 13 12/03/2013 0848   ALKPHOS 51 12/03/2013 0848   BILITOT 0.4 12/03/2013 0848   GFRNONAA 58* 12/31/2013 1339   GFRNONAA 59* 08/28/2013 1206   GFRAA 67* 12/31/2013 1339   GFRAA 68 08/28/2013 1206    No results for input(s): LIPASE, AMYLASE in the last 168 hours. No results for input(s): AMMONIA in the last 168 hours.   Recent Labs Lab 12/31/13 2051  CKTOTAL 214  TROPONINI <0.30   BNP (last 3 results) No results for input(s): PROBNP in the last 8760 hours.  Radiological Exams on Admission: Dg Cervical Spine 2 Or 3 Views  12/31/2013   CLINICAL DATA:  Posterior neck pain without injury, initial encounter  EXAM: CERVICAL SPINE - 2-3 VIEW  COMPARISON:  None.  FINDINGS: Seven cervical segments are well visualized. Vertebral body height is well maintained. Heavy carotid calcifications are noted bilaterally. No acute fracture or acute facet abnormality is noted. The odontoid is within normal  limits.  IMPRESSION: No acute abnormality noted.   Electronically Signed   By: Alcide Clever M.D.   On: 12/31/2013 22:57   Dg Lumbar Spine 2-3 Views  12/31/2013   CLINICAL DATA:  No injury. Pain down both legs with low back pain and right leg paresthesias.  EXAM: LUMBAR SPINE - 2-3 VIEW  COMPARISON:  06/01/2007  FINDINGS: Normal alignment of the lumbar spine. Mild diffuse degenerative changes predominate involving the facet joints. Minimal endplate hypertrophic changes are present. No vertebral compression deformities. Intervertebral disc space heights are preserved. Extensive vascular calcifications noted in the aorta and iliac arteries. Surgical clips present in the abdomen and pelvis.  IMPRESSION: No acute bony abnormalities. Normal alignment. Degenerative changes predominate in the facet joints. Vascular calcifications.   Electronically Signed   By: Burman Nieves M.D.   On: 12/31/2013 22:58   US Venous Img Lower Unilateral Left  12/31/2013   CLINICAL DATA:  63 year old male with left lower extremity pain, swelling and numbness  EXAM: LEFT LOWER EXTREMITY VENOUS DOPPLER ULTRASOUND  TECHNIQUE: Gray-scale sonography with graded compression, as well as color Doppler and duplex ultrasound were performed to evaluate the lower extremity deep venous systems from the level of the common femoral vein and including the common femoral, femoral, profunda femoral, popliteal and calf veins including the posterior tibial, peroneal and gastrocnemius veins when visible. The superficial great saphenous vein was also interrogated. Spectral Doppler was utilized to evaluate flow at rest and with distal augmentation maneuvers in the common femoral, femoral and popliteal veins.  COMPARISON:  Prior duplex venous ultrasound of the left lower extremity 01/20/2012  FINDINGS: Contralateral Common Femoral Vein: Respiratory phasicity is normal and symmetric with the symptomatic side. No evidence of thrombus. Normal compressibility.   Common Femoral Vein: No evidence of thrombus. Normal compressibility, respiratory phasicity and response to augmentation.  Saphenofemoral Junction: No evidence of thrombus. Normal compressibility and flow on color Doppler imaging.  Profunda Femoral Vein: No evidence of thrombus. Normal compressibility and flow on color Doppler imaging.  Femoral Vein: No evidence of thrombus. Normal compressibility, respiratory phasicity  and response to augmentation.  Popliteal Vein: No evidence of thrombus. Normal compressibility, respiratory phasicity and response to augmentation.  Calf Veins: No evidence of thrombus. Normal compressibility and flow on color Doppler imaging.  Superficial Great Saphenous Vein: No evidence of thrombus. Normal compressibility and flow on color Doppler imaging.  Venous Reflux:  None.  Other Findings:  None.  IMPRESSION: No evidence of deep venous thrombosis.   Electronically Signed   By: Malachy Moan M.D.   On: 12/31/2013 15:09   US Arterial Seg Single  12/31/2013   CLINICAL DATA:  Left leg pain, swelling and numbness. History of aortobifemoral graft. History of lower extremity grafts.  EXAM: NONINVASIVE PHYSIOLOGIC VASCULAR STUDY OF BILATERAL LOWER EXTREMITIES  TECHNIQUE: Evaluation of both lower extremities were performed at rest, including calculation of ankle-brachial indices with single level Doppler, pressure and pulse volume recording.  COMPARISON:  11/24/2011  FINDINGS: Right ABI: The right ankle arteries are not compressible and unable to obtain ankle-brachial indices.  Left ABI:  0.88  Right Lower Extremity: Irregular Doppler waveforms throughout the right ankle.  Left Lower Extremity: Irregular Doppler waveforms throughout the left ankle. The ABI in the posterior tibial artery is 0.88 and 0.15 from the dorsalis pedis artery.  IMPRESSION: Right ankle-brachial index could not be obtained due to non compressible vessels. Irregular waveforms in the right ankle are similar to the  previous examination. Previously, the right ABI was 0.56 and suspect severe peripheral vascular disease in the right lower extremity.  Left ABI is 0.88. This could be an over-estimation because it was previously 0.38. The patient may have had revascularization involving the posterior tibial artery to account for the improved ABI.   Electronically Signed   By: Richarda Overlie M.D.   On: 12/31/2013 15:11    Assessment/Plan Principal Problem:   Lower limb ischemia Active Problems:   Diabetic neuropathy   Essential hypertension   Arteriosclerotic cardiovascular disease (ASCVD)   Cerebrovascular disease   Peripheral vascular disease   Sick sinus syndrome   Obstructive sleep apnea   1. Lower limb ischemia The patient is presenting with complaints of left leg pain. With abnormal ABI vascular surgery has evaluated the patient and feels that the patient has blockage of the left limb of his aortofemoral graft. At present we'll recommend close observation and IV hydration. They also recommended IV heparin. Patient will be scheduled to have an angiogram on 01/02/2014. We will continue with aspirinfollow the recommendations from vascular surgery.  2.diabetes mellitus. Likely uncontrolled. Hemoglobin A1c significantly elevated. At present we will continue patient's home Levemir and placing him on sliding scale as well as continuing his anemia coverage.  3.bilateral paresthesia. Patient is found to have bilateral sounds and numbness of the leg. As well as he has some mild numbness of his fingers. CT of the head is negative for any acute abnormality as well as x-rays cervical spine as well as x-ray lumbar spine. At present we'll continue to closely monitor and follow serial neuro checks and monitor him on telemetry. Continue gabapentin  4.coronary artery disease. Pacemaker implant. Continue with aspirin monitoring him on telemetry. Patient did have some chest pain 2 weeks ago but denies any chest  pain on and off. The pain he described was a sharp stabbing pain. Currently chest pain-free close monitoring required.  5. Accelerated hypertension. Blood pressure mildly elevated. At present holding patient's lisinopril hydrochlorothiazide and metformin and continue with Lopressor as well as adding IV hydralazine as needed.  Advance goals of care  discussion: full code as per my discussion with patient   Consults: vascular surgery  DVT Prophylaxis: on chronic anticoagulation Nutrition: nothing by mouth after midnight  Family Communication: family was present at bedside, opportunity was given to ask question and all questions were answered satisfactorily at the time of interview. Disposition: Admitted to inpatient in telemetry unit.  Author: Lynden OxfordPranav Della Scrivener, MD [facility] Pager: [phone] 12/31/2013, 11:11 PM    If 7PM-7AM, please contact night-coverage [email or web address] Password [email or web address]

## 2013-12-31 NOTE — ED Notes (Signed)
[facility] here [facility].

## 2013-12-31 NOTE — ED Provider Notes (Signed)
CSN: [id number]     Arrival date & time 12/31/13  1236 History   First MD Initiated Contact with Patient 12/31/13 1300     Chief Complaint  Patient presents with  . Leg Pain     (Consider location/radiation/quality/duration/timing/severity/associated sxs/prior Treatment) HPI The patient is sent from [facility] for abnormal ABIs. Patient has known vascular disease. He has had previous arterial bypass surgery. The patient developed pain this morning as well as numbness in the left leg. This is something he has not experienced since he had his bypass surgery many years ago. The symptoms have persisted. The patient has not taken his medications today. Past Medical History  Diagnosis Date  . Arteriosclerotic cardiovascular disease (ASCVD)     CABG in 2003 for 3 VD; 07/2007: Stress nuclear-inferior infarction without ischemia; normal EF by echo in 06/2005  . Diabetes mellitus type II   . Hyperlipidemia   . Degenerative joint disease     Left knee  . Cerebrovascular disease 2003    Carotid ultrasound in 2010: Patent right CEA site, 60-79% left internal carotid artery  . Peripheral vascular disease 2007    aortobifemoral BPG-2007  . Anemia   . Peripheral neuropathy   . Hypertension   . Sick sinus syndrome 1996    permanent pacemaker-1996; generator change in 2010  . Chronic obstructive pulmonary disease   . Obesity   . Obstructive sleep apnea   . Benign prostatic hypertrophy   . Gastroesophageal reflux disease   . Tobacco abuse, in remission     50 pack years total consumption; Quit in 2007   Past Surgical History  Procedure Laterality Date  . Coronary artery bypass graft  2003    three-vessel disease  . Iliac artery stent      Percutaneous intervention-right external iliac; unclear whether stent was placed  . Pacemaker insertion  1996    generator change in 2010  . Carotid endarterectomy  2003    Right  . Pacemaker insertion      Generator replaced in 04/2008  . Aorta  - bilateral femoral artery bypass graft  2007  . Femoral-popliteal bypass graft  1998    bilateral  . Colonoscopy  2012    Negative screening study.  . Ileocolonoscopy  02/20/2007    WUJ:WJXBJY rectum, colon, and terminal ileum   Family History  Problem Relation Age of Onset  . Dementia Mother   . Heart attack Father   . Coronary artery disease Father   . Peripheral vascular disease Sister   . Diabetes Brother   . Peripheral vascular disease Brother   . Hypertension Brother   . Coronary artery disease Brother    History  Substance Use Topics  . Smoking status: Former Smoker -- 2.00 packs/day for 25 years    Types: Cigarettes  . Smokeless tobacco: Former Neurosurgeon    Quit date: 02/16/1996  . Alcohol Use: No    Review of Systems 10 Systems reviewed and are negative for acute change except as noted in the HPI.    Allergies  Review of patient's allergies indicates no known allergies.  Home Medications   Prior to Admission medications   Medication Sig Start Date End Date Taking? Authorizing Provider  acetaminophen (TYLENOL) 500 MG tablet Take 2 tablets (1,000 mg total) by mouth every 6 (six) hours as needed. Pain. 05/08/13  Yes Salley Scarlet, MD  albuterol (PROVENTIL HFA;VENTOLIN HFA) 108 (90 BASE) MCG/ACT inhaler Inhale 2 puffs into the lungs every 4 (  four) hours as needed. 06/21/13  Yes Salley Scarlet, MD  aspirin 81 MG tablet Take 1 tablet (81 mg total) by mouth daily. 05/08/13  Yes Salley Scarlet, MD  BAYER CONTOUR NEXT TEST test strip TEST THREE TIMES A DAY 08/07/13  Yes Salley Scarlet, MD  Insulin Detemir (LEVEMIR FLEXTOUCH) 100 UNIT/ML Pen Inject 45 Units into the skin daily at 10 pm. 10/24/13  Yes Salley Scarlet, MD  insulin lispro (HUMALOG) 100 UNIT/ML KiwkPen Inject 12 Units into the skin 3 (three) times daily with meals. 06/24/13  Yes Salley Scarlet, MD  lisinopril-hydrochlorothiazide (PRINZIDE,ZESTORETIC) 20-12.5 MG per tablet Take 2 tablets by mouth daily.  06/21/13  Yes Salley Scarlet, MD  metFORMIN (GLUCOPHAGE) 1000 MG tablet Take 1 tablet (1,000 mg total) by mouth 2 (two) times daily with a meal. 06/21/13  Yes Salley Scarlet, MD  metoprolol tartrate (LOPRESSOR) 25 MG tablet Take 1 tablet (25 mg total) by mouth 2 (two) times daily. 06/21/13  Yes Salley Scarlet, MD  simvastatin (ZOCOR) 10 MG tablet Take 1 tablet (10 mg total) by mouth at bedtime. 06/21/13  Yes Salley Scarlet, MD  triamcinolone cream (KENALOG) 0.1 % Apply 1 application topically 2 (two) times daily. Apply to leg 08/28/13  Yes Salley Scarlet, MD  gabapentin (NEURONTIN) 300 MG capsule Take 1 capsule (300 mg total) by mouth 3 (three) times daily. Patient not taking: Reported on 12/31/2013 06/21/13   Salley Scarlet, MD   BP 163/78 mmHg  Pulse 70  Temp(Src) 98.4 F (36.9 C) (Oral)  Resp 18  Ht 5\' 4"  (1.626 m)  Wt 188 lb 0.8 oz (85.3 kg)  BMI 32.26 kg/m2  SpO2 98% Physical Exam  Constitutional: He is oriented to person, place, and time. He appears well-developed and well-nourished. No distress.  HENT:  Head: Normocephalic and atraumatic.  Eyes: EOM are normal. Pupils are equal, round, and reactive to light.  Cardiovascular: Normal rate, regular rhythm and normal heart sounds.   Pulmonary/Chest: Effort normal and breath sounds normal.  Abdominal: Soft. There is no tenderness.  Musculoskeletal:  The patient does not have any peripheral edema. The calves are nontender. He does not have appreciable pulses in either foot. The right foot however is warm to the touch and patient has intact motor function. The left foot is slightly cooler to the touch however still is warm and does not have pallor. Patient can move the toes of both feet. There are no acutely necrotic areas. Patient has hypertrophic thickened toenails bilaterally.  Neurological: He is alert and oriented to person, place, and time. Coordination normal.  Skin: Skin is warm and dry.  Psychiatric: He has a normal mood and  affect.    ED Course  Procedures (including critical care time) Labs Review Labs Reviewed  BASIC METABOLIC PANEL - Abnormal; Notable for the following:    Glucose, Bld 333 (*)    BUN 25 (*)    GFR calc non Af Amer 58 (*)    GFR calc Af Amer 67 (*)    All other components within normal limits  CBC WITH DIFFERENTIAL - Abnormal; Notable for the following:    Hemoglobin 12.7 (*)    HCT 38.7 (*)    All other components within normal limits  GLUCOSE, CAPILLARY - Abnormal; Notable for the following:    Glucose-Capillary 301 (*)    All other components within normal limits  CBG MONITORING, ED - Abnormal; Notable for the following:  Glucose-Capillary 240 (*)    All other components within normal limits  PROTIME-INR  APTT  TROPONIN I  CK  HEPARIN LEVEL (UNFRACTIONATED)  CBC  HEMOGLOBIN A1C  URINALYSIS, ROUTINE W REFLEX MICROSCOPIC  URINE RAPID DRUG SCREEN (HOSP PERFORMED)    Imaging Review Dg Cervical Spine 2 Or 3 Views  12/31/2013   CLINICAL DATA:  Posterior neck pain without injury, initial encounter  EXAM: CERVICAL SPINE - 2-3 VIEW  COMPARISON:  None.  FINDINGS: Seven cervical segments are well visualized. Vertebral body height is well maintained. Heavy carotid calcifications are noted bilaterally. No acute fracture or acute facet abnormality is noted. The odontoid is within normal limits.  IMPRESSION: No acute abnormality noted.   Electronically Signed   By: Alcide CleverMark  Lukens M.D.   On: 12/31/2013 22:57   Dg Lumbar Spine 2-3 Views  12/31/2013   CLINICAL DATA:  No injury. Pain down both legs with low back pain and right leg paresthesias.  EXAM: LUMBAR SPINE - 2-3 VIEW  COMPARISON:  06/01/2007  FINDINGS: Normal alignment of the lumbar spine. Mild diffuse degenerative changes predominate involving the facet joints. Minimal endplate hypertrophic changes are present. No vertebral compression deformities. Intervertebral disc space heights are preserved. Extensive vascular calcifications  noted in the aorta and iliac arteries. Surgical clips present in the abdomen and pelvis.  IMPRESSION: No acute bony abnormalities. Normal alignment. Degenerative changes predominate in the facet joints. Vascular calcifications.   Electronically Signed   By: Burman NievesWilliam  Stevens M.D.   On: 12/31/2013 22:58   Ct Head Wo Contrast  12/31/2013   CLINICAL DATA:  Left-sided weakness  EXAM: CT HEAD WITHOUT CONTRAST  TECHNIQUE: Contiguous axial images were obtained from the base of the skull through the vertex without intravenous contrast.  COMPARISON:  None.  FINDINGS: The bony calvarium is intact. No gross soft tissue abnormality is noted. The paranasal sinuses show evidence of a large mucosal retention cyst in the right maxillary antrum. The ventricles are of normal size and configuration. No findings to suggest acute hemorrhage, acute infarction or space-occupying mass lesion is noted.  IMPRESSION: No acute intracranial abnormality noted.   Electronically Signed   By: Alcide CleverMark  Lukens M.D.   On: 12/31/2013 23:21   [place] Venous Img Lower Unilateral Left  12/31/2013   CLINICAL DATA:  63 year old male with left lower extremity pain, swelling and numbness  EXAM: LEFT LOWER EXTREMITY VENOUS DOPPLER ULTRASOUND  TECHNIQUE: Gray-scale sonography with graded compression, as well as color Doppler and duplex ultrasound were performed to evaluate the lower extremity deep venous systems from the level of the common femoral vein and including the common femoral, femoral, profunda femoral, popliteal and calf veins including the posterior tibial, peroneal and gastrocnemius veins when visible. The superficial great saphenous vein was also interrogated. Spectral Doppler was utilized to evaluate flow at rest and with distal augmentation maneuvers in the common femoral, femoral and popliteal veins.  COMPARISON:  Prior duplex venous ultrasound of the left lower extremity 01/20/2012  FINDINGS: Contralateral Common Femoral Vein: Respiratory  phasicity is normal and symmetric with the symptomatic side. No evidence of thrombus. Normal compressibility.  Common Femoral Vein: No evidence of thrombus. Normal compressibility, respiratory phasicity and response to augmentation.  Saphenofemoral Junction: No evidence of thrombus. Normal compressibility and flow on color Doppler imaging.  Profunda Femoral Vein: No evidence of thrombus. Normal compressibility and flow on color Doppler imaging.  Femoral Vein: No evidence of thrombus. Normal compressibility, respiratory phasicity and response to augmentation.  Popliteal Vein: No  evidence of thrombus. Normal compressibility, respiratory phasicity and response to augmentation.  Calf Veins: No evidence of thrombus. Normal compressibility and flow on color Doppler imaging.  Superficial Great Saphenous Vein: No evidence of thrombus. Normal compressibility and flow on color Doppler imaging.  Venous Reflux:  None.  Other Findings:  None.  IMPRESSION: No evidence of deep venous thrombosis.   Electronically Signed   By: Malachy MoanHeath  McCullough M.D.   On: 12/31/2013 15:09   [place] Arterial Seg Single  12/31/2013   CLINICAL DATA:  Left leg pain, swelling and numbness. History of aortobifemoral graft. History of lower extremity grafts.  EXAM: NONINVASIVE PHYSIOLOGIC VASCULAR STUDY OF BILATERAL LOWER EXTREMITIES  TECHNIQUE: Evaluation of both lower extremities were performed at rest, including calculation of ankle-brachial indices with single level Doppler, pressure and pulse volume recording.  COMPARISON:  11/24/2011  FINDINGS: Right ABI: The right ankle arteries are not compressible and unable to obtain ankle-brachial indices.  Left ABI:  0.88  Right Lower Extremity: Irregular Doppler waveforms throughout the right ankle.  Left Lower Extremity: Irregular Doppler waveforms throughout the left ankle. The ABI in the posterior tibial artery is 0.88 and 0.15 from the dorsalis pedis artery.  IMPRESSION: Right ankle-brachial index could not  be obtained due to non compressible vessels. Irregular waveforms in the right ankle are similar to the previous examination. Previously, the right ABI was 0.56 and suspect severe peripheral vascular disease in the right lower extremity.  Left ABI is 0.88. This could be an over-estimation because it was previously 0.38. The patient may have had revascularization involving the posterior tibial artery to account for the improved ABI.   Electronically Signed   By: Richarda OverlieAdam  Henn M.D.   On: 12/31/2013 15:11     EKG Interpretation None     Dr. Durwin Noraixon of vascular surgery was notified of the patient's presence in the emergency department. He had previously been consult did [facility] by Dr. Lynelle DoctorKnapp. He will assess the patient in the emergency department. MDM   Final diagnoses:  Pain  Swelling  Numbness  Peripheral vascular disease of extremity   This patient presents with a history of peripheral vascular disease. This morning his symptoms became acutely worse. ABIs done previously indicated significantly compromised flow to the left lower extremity. The patient will have vascular surgery consult done in the emergency department to determine ongoing treatment and management.     Arby BarretteMarcy Zacherie Honeyman, MD 01/01/14 [phone]

## 2013-12-31 NOTE — Consult Note (Signed)
[facility] [place]  Patient name: [name] MRN: [id number] DOB: 06/16/1950 Sex: male  REASON FOR CONSULT: Left lower extremity pain and swelling. The patient is transferred from [facility].   HPI: [name] is a 63 y.o. male who presented to the [facility] emergency department with a chief complaint of left leg swelling and pain. Workup there included a venous duplex scan which showed no evidence of DVT of the left lower extremity. She had calcific vessels and ABIs were not felt to be accurate. There was some concern about left lower extremity ischemia and the patient was transferred urgently for vascular evaluation.  This patient has an extensive vascular history. It looks like he was last seen in our office on 06/26/2008 by Dr. Liliane Bade. He had undergone a previous right carotid endarterectomy in 2003. He had an aortobifemoral bypass graft done in 2007 and underwent coronary revascularization in 2003. At the time of his last visit he had a 60-79% left internal carotid artery stenosis. This was stable. He was to follow up in 1 year with carotid Doppler studies but was never seen again in our office. A note in August 2009 also states he's had bilateral femoropopliteal bypass grafts.  On my history, the patient notes that at 10 AM this morning he developed fairly sudden onset of left lower extremity pain. He also noted some paresthesias in the left lower extremity and swelling. In addition he had some paresthesias in the right foot. He states that his symptoms on both sides have improved but he does have some persistent paresthesias on the left.  Prior to this event he does describe bilateral thigh and calf claudication. He denies any history of rest pain or history of nonhealing ulcers.  His risk factors for peripheral vascular disease include diabetes, hypertension, hypercholesterolemia, and a history of tobacco use in the past. He quit in  2006.  Past Medical History  Diagnosis Date  . Arteriosclerotic cardiovascular disease (ASCVD)     CABG in 2003 for 3 VD; 07/2007: Stress nuclear-inferior infarction without ischemia; normal EF by echo in 06/2005  . Diabetes mellitus type II   . Hyperlipidemia   . Degenerative joint disease     Left knee  . Cerebrovascular disease 2003    Carotid ultrasound in 2010: Patent right CEA site, 60-79% left internal carotid artery  . Peripheral vascular disease 2007    aortobifemoral BPG-2007  . Anemia   . Peripheral neuropathy   . Hypertension   . Sick sinus syndrome 1996    permanent pacemaker-1996; generator change in 2010  . Chronic obstructive pulmonary disease   . Obesity   . Obstructive sleep apnea   . Benign prostatic hypertrophy   . Gastroesophageal reflux disease   . Tobacco abuse, in remission     50 pack years total consumption; Quit in 2007    Family History  Problem Relation Age of Onset  . Dementia Mother   . Heart attack Father   . Coronary artery disease Father   . Peripheral vascular disease Sister   . Diabetes Brother   . Peripheral vascular disease Brother   . Hypertension Brother   . Coronary artery disease Brother    He denies any history of premature cardiovascular disease.  SOCIAL HISTORY: History  Substance Use Topics  . Smoking status: Former Smoker -- 2.00 packs/day for 25 years    Types: Cigarettes  . Smokeless tobacco: Former Careers information officer  date: 02/16/1996  . Alcohol Use: No  He is married and has 2 children. His wife tells me that he quit tobacco in 2006.  No Known Allergies  No current facility-administered medications for this encounter.   Current Outpatient Prescriptions  Medication Sig Dispense Refill  . acetaminophen (TYLENOL) 500 MG tablet Take 2 tablets (1,000 mg total) by mouth every 6 (six) hours as needed. Pain. 90 tablet 3  . albuterol (PROVENTIL HFA;VENTOLIN HFA) 108 (90 BASE) MCG/ACT inhaler Inhale 2 puffs into the lungs every  4 (four) hours as needed. 18 g 3  . aspirin 81 MG tablet Take 1 tablet (81 mg total) by mouth daily. 90 tablet 3  . BAYER CONTOUR NEXT TEST test strip TEST THREE TIMES A DAY 100 each 0  . Insulin Detemir (LEVEMIR FLEXTOUCH) 100 UNIT/ML Pen Inject 45 Units into the skin daily at 10 pm. 15 mL 11  . insulin lispro (HUMALOG) 100 UNIT/ML KiwkPen Inject 12 Units into the skin 3 (three) times daily with meals.    Marland Kitchen. lisinopril-hydrochlorothiazide (PRINZIDE,ZESTORETIC) 20-12.5 MG per tablet Take 2 tablets by mouth daily. 180 tablet 3  . metFORMIN (GLUCOPHAGE) 1000 MG tablet Take 1 tablet (1,000 mg total) by mouth 2 (two) times daily with a meal. 180 tablet 3  . metoprolol tartrate (LOPRESSOR) 25 MG tablet Take 1 tablet (25 mg total) by mouth 2 (two) times daily. 180 tablet 3  . simvastatin (ZOCOR) 10 MG tablet Take 1 tablet (10 mg total) by mouth at bedtime. 90 tablet 3  . triamcinolone cream (KENALOG) 0.1 % Apply 1 application topically 2 (two) times daily. Apply to leg 60 g 1  . gabapentin (NEURONTIN) 300 MG capsule Take 1 capsule (300 mg total) by mouth 3 (three) times daily. (Patient not taking: Reported on 12/31/2013) 270 capsule 3   REVIEW OF SYSTEMS: Arly.Keller[X ] denotes positive finding; [  ] denotes negative finding CARDIOVASCULAR:  Arly.Keller[X ] chest pain   [ ]  chest pressure   [ ]  palpitations   [ ]  orthopnea   Arly.Keller[X ] dyspnea on exertion   [ ]  claudication   [ ]  rest pain   [ ]  DVT   [ ]  phlebitis PULMONARY:   [ ]  productive cough   [ ]  asthma   [ ]  wheezing NEUROLOGIC:   [ ]  weakness  [ ]  paresthesias  [ ]  aphasia  [ ]  amaurosis  [ ]  dizziness HEMATOLOGIC:   [ ]  bleeding problems   [ ]  clotting disorders MUSCULOSKELETAL:  [ ]  joint pain   [ ]  joint swelling [ ]  leg swelling GASTROINTESTINAL: [ ]   blood in stool  [ ]   hematemesis GENITOURINARY:  [ ]   dysuria  [ ]   hematuria PSYCHIATRIC:  [ ]  history of major depression INTEGUMENTARY:  [ ]  rashes  [ ]  ulcers CONSTITUTIONAL:  [ ]  fever   [ ]  chills  PHYSICAL  EXAM: Filed Vitals:   12/31/13 1717 12/31/13 1745 12/31/13 1800 12/31/13 1809  BP: 175/85 200/92 168/91 168/91  Pulse: 74 70 74   Temp: 97.8 F (36.6 C)     TempSrc: Oral     Resp: 20 18 20    Height:      Weight:      SpO2: 100% 96% 99%    Body mass index is 36.03 kg/(m^2). GENERAL: The patient is a well-nourished male, in no acute distress. The vital signs are documented above. CARDIOVASCULAR: There is a regular rate and rhythm. I do not detect carotid  bruits. He has a pacemaker on the right upper chest. I cannot palpate a right radial pulse. He does have a palpable left radial pulse. I cannot palpate a left femoral pulse. He does have a palpable right femoral pulse. I cannot palpate popliteal or pedal pulses. He has brisk Doppler signals in the right dorsalis pedis and posterior tibial positions. He has monophasic Doppler signals in the left dorsalis pedis and posterior tibial positions. The dorsalis pedis signal is stronger than the posterior tibial signal. Both feet appear adequately perfused. PULMONARY: There is good air exchange bilaterally without wheezing or rales. ABDOMEN: Soft and non-tender with normal pitched bowel sounds.  MUSCULOSKELETAL: There are no major deformities or cyanosis. NEUROLOGIC: He has some paresthesias in the left foot. Motor function is intact.Marland Kitchen. SKIN: There are no ulcers or rashes noted. PSYCHIATRIC: The patient has a normal affect.  DATA:  Lab Results  Component Value Date   WBC 7.6 12/31/2013   HGB 12.7* 12/31/2013   HCT 38.7* 12/31/2013   MCV 83.2 12/31/2013   PLT 207 12/31/2013   Lab Results  Component Value Date   NA 139 12/31/2013   K 4.4 12/31/2013   CL 97 12/31/2013   CO2 29 12/31/2013   Lab Results  Component Value Date   CREATININE 1.28 12/31/2013   Lab Results  Component Value Date   INR 1.01 12/31/2013   Lab Results  Component Value Date   HGBA1C 10.1* 12/03/2013   CBG (last 3)   Recent Labs  12/31/13 1725  GLUCAP 240*    His creatinine clearance is 67 mL/m.  I have reviewed his venous duplex scan which shows no evidence of DVT in the left lower extremity.  I have reviewed his arterial study which shows an ABI of 88% on the left although this is likely falsely elevated because of this calcific disease.  MEDICAL ISSUES: LIKELY OCCLUDED LEFT LIMB OF AORTOFEMORAL BYPASS GRAFT: Based on his history and physical exam, I suspect that he has occluded the left limb of his aortofemoral bypass graft was done in 2007 by Dr. Madilyn FiremanHayes. However, his symptoms are somewhat unusual in that he did also have some symptoms in the right foot although he has a palpable right femoral pulse. In addition he had some leg swelling. I have recommended that he be admitted to the hospital for intravenous heparin and I we'll ask triad hospitalist to admit the patient given his multiple medical comorbidities. He has some mild renal insufficiency and will need to be hydrated prior to his arteriogram. In addition we will likely need to hold his metformin prior to arteriography which I will tentatively schedule for Thursday, 01/02/2014. Based on the results of his arteriogram he could potentially undergo a right to left femorofemoral bypass graft or potentially even thrombectomy of the left limb of the graft pending results of his arteriogram. He does admit to some occasional chest pressure and has a history of a pacemaker and may need preoperative cardiac evaluation. Currently the left foot is viable  And I do not think it would be wise to proceed urgently with femoral embolectomy given the multiple issues involved and also the history which is somewhat confusing in that he had some paresthesias in the right foot also.  Eswin Worrell S [facility] [place] Beeper: [phone]

## 2013-12-31 NOTE — Progress Notes (Addendum)
ANTICOAGULATION CONSULT NOTE - Initial Consult  Pharmacy Consult for Heparin Indication: leg ischemia  No Known Allergies  Patient Measurements: Height: 5\' 4"  (162.6 cm) Weight: 210 lb (95.255 kg) IBW/kg (Calculated) : 59.2 Heparin Dosing Weight: 80 kg  Vital Signs: Temp: 97.8 F (36.6 C) (11/17 1717) Temp Source: Oral (11/17 1717) BP: 186/94 mmHg (11/17 1815) Pulse Rate: 70 (11/17 1815)  Labs:  Recent Labs  12/31/13 1339  HGB 12.7*  HCT 38.7*  PLT 207  APTT 28  LABPROT 13.4  INR 1.01  CREATININE 1.28    Estimated Creatinine Clearance: 60.7 mL/min (by C-G formula based on Cr of 1.28).   Medical History: Past Medical History  Diagnosis Date  . Arteriosclerotic cardiovascular disease (ASCVD)     CABG in 2003 for 3 VD; 07/2007: Stress nuclear-inferior infarction without ischemia; normal EF by echo in 06/2005  . Diabetes mellitus type II   . Hyperlipidemia   . Degenerative joint disease     Left knee  . Cerebrovascular disease 2003    Carotid ultrasound in 2010: Patent right CEA site, 60-79% left internal carotid artery  . Peripheral vascular disease 2007    aortobifemoral BPG-2007  . Anemia   . Peripheral neuropathy   . Hypertension   . Sick sinus syndrome 1996    permanent pacemaker-1996; generator change in 2010  . Chronic obstructive pulmonary disease   . Obesity   . Obstructive sleep apnea   . Benign prostatic hypertrophy   . Gastroesophageal reflux disease   . Tobacco abuse, in remission     50 pack years total consumption; Quit in 2007    Assessment: 63 yo m who presented to the ED on 11/17 after having pain in his left leg.  No evidence of DVT but suspicious for occluded left femoral bypass graft.  Pharmacy is consulted to begin heparin.  Hgb 12.7, plts 207, no bleeding reported.  Patient is also not on AC PTA.    Goal of Therapy:  Heparin level 0.3-0.7 units/ml Monitor platelets by anticoagulation protocol: Yes   Plan:  Heparin bolus 4,000  units x 1 (~50 units/kg) Heparin infusion at 1,300 units/hr (~16.2 units/kg) 6-hr HL @ 0200 Daily HL and CBC Monitor hgb/plts, s/s of bleeding, clinical course  Lupe Bonner L. Roseanne RenoStewart, PharmD Clinical Pharmacy Resident Pager: [phone] 12/31/2013 7:33 PM

## 2013-12-31 NOTE — ED Notes (Signed)
MD at bedside. 

## 2014-01-01 LAB — CBC
HCT: 36 % — ABNORMAL LOW (ref 39.0–52.0)
HEMOGLOBIN: 11.8 g/dL — AB (ref 13.0–17.0)
MCH: 26.9 pg (ref 26.0–34.0)
MCHC: 32.8 g/dL (ref 30.0–36.0)
MCV: 82.2 fL (ref 78.0–100.0)
Platelets: 211 10*3/uL (ref 150–400)
RBC: 4.38 MIL/uL (ref 4.22–5.81)
RDW: 12.1 % (ref 11.5–15.5)
WBC: 6.6 10*3/uL (ref 4.0–10.5)

## 2014-01-01 LAB — URINALYSIS, ROUTINE W REFLEX MICROSCOPIC
BILIRUBIN URINE: NEGATIVE
Glucose, UA: >1000 mg/dL — AB
Ketones, ur: NEGATIVE mg/dL
Leukocytes, UA: NEGATIVE
NITRITE: NEGATIVE
PROTEIN: 100 mg/dL — AB
SPECIFIC GRAVITY, URINE: 1.025 (ref 1.005–1.030)
UROBILINOGEN UA: 0.2 mg/dL (ref 0.0–1.0)
pH: 5.5 (ref 5.0–8.0)

## 2014-01-01 LAB — COMPREHENSIVE METABOLIC PANEL
ALT: 16 U/L (ref 0–53)
ANION GAP: 13 (ref 5–15)
AST: 19 U/L (ref 0–37)
Albumin: 3.4 g/dL — ABNORMAL LOW (ref 3.5–5.2)
Alkaline Phosphatase: 54 U/L (ref 39–117)
BILIRUBIN TOTAL: 0.7 mg/dL (ref 0.3–1.2)
BUN: 21 mg/dL (ref 6–23)
CHLORIDE: 105 meq/L (ref 96–112)
CO2: 25 mEq/L (ref 19–32)
Calcium: 9.7 mg/dL (ref 8.4–10.5)
Creatinine, Ser: 1.24 mg/dL (ref 0.50–1.35)
GFR calc non Af Amer: 60 mL/min — ABNORMAL LOW (ref >90)
GFR, EST AFRICAN AMERICAN: 69 mL/min — AB (ref >90)
Glucose, Bld: 131 mg/dL — ABNORMAL HIGH (ref 70–99)
Potassium: 4.1 mEq/L (ref 3.7–5.3)
Sodium: 143 mEq/L (ref 137–147)
Total Protein: 6.9 g/dL (ref 6.0–8.3)

## 2014-01-01 LAB — URINE MICROSCOPIC-ADD ON

## 2014-01-01 LAB — GLUCOSE, CAPILLARY
GLUCOSE-CAPILLARY: 235 mg/dL — AB (ref 70–99)
Glucose-Capillary: 134 mg/dL — ABNORMAL HIGH (ref 70–99)
Glucose-Capillary: 163 mg/dL — ABNORMAL HIGH (ref 70–99)
Glucose-Capillary: 195 mg/dL — ABNORMAL HIGH (ref 70–99)
Glucose-Capillary: 301 mg/dL — ABNORMAL HIGH (ref 70–99)

## 2014-01-01 LAB — RAPID URINE DRUG SCREEN, HOSP PERFORMED
Amphetamines: NOT DETECTED
BARBITURATES: NOT DETECTED
Benzodiazepines: NOT DETECTED
COCAINE: NOT DETECTED
Opiates: NOT DETECTED
TETRAHYDROCANNABINOL: NOT DETECTED

## 2014-01-01 LAB — HEPARIN LEVEL (UNFRACTIONATED)
HEPARIN UNFRACTIONATED: 0.56 [IU]/mL (ref 0.30–0.70)
HEPARIN UNFRACTIONATED: 0.93 [IU]/mL — AB (ref 0.30–0.70)
Heparin Unfractionated: 0.52 IU/mL (ref 0.30–0.70)

## 2014-01-01 LAB — HEMOGLOBIN A1C
HEMOGLOBIN A1C: 11.1 % — AB (ref <5.7)
MEAN PLASMA GLUCOSE: 272 mg/dL — AB (ref <117)

## 2014-01-01 MED ORDER — HEPARIN (PORCINE) IN NACL 100-0.45 UNIT/ML-% IJ SOLN
1100.0000 [IU]/h | INTRAMUSCULAR | Status: DC
  Filled 2014-01-01 (×2): qty 250

## 2014-01-01 MED ORDER — INSULIN ASPART 100 UNIT/ML ~~LOC~~ SOLN
0.0000 [IU] | Freq: Four times a day (QID) | SUBCUTANEOUS | Status: DC
  Administered 2014-01-01: 5 [IU] via SUBCUTANEOUS
  Administered 2014-01-01: 2 [IU] via SUBCUTANEOUS
  Administered 2014-01-01: 3 [IU] via SUBCUTANEOUS
  Administered 2014-01-02: 2 [IU] via SUBCUTANEOUS
  Administered 2014-01-02: 5 [IU] via SUBCUTANEOUS
  Administered 2014-01-03 (×2): 8 [IU] via SUBCUTANEOUS
  Administered 2014-01-04 (×2): 3 [IU] via SUBCUTANEOUS
  Administered 2014-01-04: 7 [IU] via SUBCUTANEOUS
  Administered 2014-01-05 (×3): 3 [IU] via SUBCUTANEOUS
  Administered 2014-01-05 – 2014-01-06 (×2): 5 [IU] via SUBCUTANEOUS
  Administered 2014-01-06 (×2): 3 [IU] via SUBCUTANEOUS
  Administered 2014-01-07: 2 [IU] via SUBCUTANEOUS
  Administered 2014-01-07: 3 [IU] via SUBCUTANEOUS

## 2014-01-01 MED ORDER — SODIUM CHLORIDE 0.9 % IV BOLUS (SEPSIS)
500.0000 mL | Freq: Once | INTRAVENOUS | Status: AC
  Administered 2014-01-01: 500 mL via INTRAVENOUS

## 2014-01-01 NOTE — Plan of Care (Signed)
Problem: Phase I Progression Outcomes Goal: OOB as tolerated unless otherwise ordered Outcome: Completed/Met Date Met:  01/01/14 Goal: Hemodynamically stable Outcome: Progressing

## 2014-01-01 NOTE — Progress Notes (Signed)
   VASCULAR SURGERY ASSESSMENT & PLAN:  * LIKELY OCCLUDED LEFT LIMB OF AORTOFEMORAL BYPASS GRAFT: The plan will be for an aortogram and runoff tomorrow via a right femoral approach. Based on this study  He will likely require either thrombectomy of the left limb of his graft, versus a right to left femorofemoral bypass graft, versus an axillobifemoral bypass. If he did require an axillobifemoral bypass, this would need to originate from the left axillary artery given his pacemaker on the right side. I tentatively plan surgery on Friday.  * Continue heparin.    * type 2 diabetes, hypertension, coronary artery disease, and chronic kidney disease being managed by [facility]. Appreciate their help.  SUBJECTIVE: he states that the left foot feels better.  PHYSICAL EXAM: Filed Vitals:   01/01/14 0413 01/01/14 0500 01/01/14 0534 01/01/14 0552  BP: 84/62  161/74 156/78  Pulse:   72   Temp:      TempSrc:      Resp:      Height:      Weight:  191 lb 2.2 oz (86.7 kg)    SpO2:       Brisk Doppler signal in the right foot with a palpable right femoral pulse. Dampened monophasic dorsalis pedis signal on the left.  Motor function is intact bilaterally.  LABS: Lab Results  Component Value Date   WBC 6.6 01/01/2014   HGB 11.8* 01/01/2014   HCT 36.0* 01/01/2014   MCV 82.2 01/01/2014   PLT 211 01/01/2014   Lab Results  Component Value Date   CREATININE 1.24 01/01/2014   Lab Results  Component Value Date   INR 1.01 12/31/2013   CBG (last 3)   Recent Labs  12/31/13 1725 01/01/14 0024 01/01/14 0334  GLUCAP 240* 301* 195*    Principal Problem:   Lower limb ischemia Active Problems:   Diabetic neuropathy   Essential hypertension   Arteriosclerotic cardiovascular disease (ASCVD)   Cerebrovascular disease   Peripheral vascular disease   Sick sinus syndrome   Obstructive sleep apnea    Cari CarawayChris Dickson Beeper: [phone] 01/01/2014

## 2014-01-01 NOTE — Progress Notes (Signed)
Utilization review completed. Thy Gullikson, RN, BSN. 

## 2014-01-01 NOTE — Progress Notes (Addendum)
[facility] History and Physical  Patient: [name]  ZOX:[id number]  DOB: 05/18/1950  DOS: the patient was seen and examined on 01/01/2014 PCP: [name], [name], MD  Chief Complaint: left leg pain  HPI: [name] is a 63 y.o. male with Past medical history of PVD, CAD, DM-2, HTN, ANEMIA, CKD, OSA, BPH, pacemaker implant. The patient presented with complaints of left leg pain. He mentions that he woke up with the symptoms in the morning the pain was started having this high and was radiating to his leg. While the day his pain was progressively worsening and therefore he decided to [facility]. In any event he was found to have abnormal ABI and vascular surgery was consulted. Vascular surgery has followed up with the patient and recommend further workup and has requested the patient to be admitted to medicine service. At the time of my evaluation patient mentions the pain in the left leg has. He also mentions that he has chronic diabetic neuropathy and has been having on and off events of numbness in both his legs as well as hands since last many months. He denies any fall trauma or injury. He denies any speech difficulty or headache or vision difficulty. He denies any weakness of any particular site. He denies any chest pain or shortness of breath or cough. He mentions he is compliant with all his medications and there is no recent change in his medications. He is a former smoker  The patient is coming from home. And at his baseline independent for most of his ADL.  Subjective No complaints  Assessment and plan  1. Lower limb ischemia The patient is presenting with complaints of left leg pain. The plan will be for an aortogram and runoff tomorrow via a right femoral approach. Based on this study He will likely require either thrombectomy of the left limb of his graft, versus a right to left femorofemoral bypass graft, versus an  axillobifemoral bypass. Surgery tomorrow Gentle hydration Continue IV heparin. Discontinue ACE inhibitor in anticipation of contrast procedure   2.diabetes mellitus. Likely uncontrolled. Hemoglobin A1c significantly elevated. Continue Levemir and SSI  3.bilateral paresthesia. Patient is found to have bilateral sounds and numbness of the leg. As well as he has some mild numbness of his fingers. CT of the head is negative for any acute abnormality as well as x-rays cervical spine as well as x-ray lumbar spine. Continue gabapentin   4.coronary artery disease. Pacemaker implant. Continue with aspirin monitoring him on telemetry. Patient did have some chest pain 2 weeks ago but denies any chest pain on and off.     5. Accelerated hypertension. Blood pressure mildly elevated., DC ACE inhibitor, HCTZ in the setting of contrast study Started on prn hydralazine  Advance goals of care discussion: full code as per my discussion with patient   Consults: vascular surgery  DVT Prophylaxis: on chronic anticoagulation Nutrition: nothing by mouth after midnight  Family Communication: family was present at bedside, opportunity was given to ask question and all questions were answered satisfactorily at the time of interview. Disposition: Admitted to inpatient in telemetry unit.       Prior to Admission medications   Medication Sig Start Date End Date Taking? Authorizing Provider  acetaminophen (TYLENOL) 500 MG tablet Take 2 tablets (1,000 mg total) by mouth every 6 (six) hours as needed. Pain. 05/08/13  Yes Salley ScarletKawanta F Sebring, MD  albuterol (PROVENTIL HFA;VENTOLIN HFA) 108 (90 BASE) MCG/ACT inhaler Inhale  2 puffs into the lungs every 4 (four) hours as needed. 06/21/13  Yes Salley Scarlet, MD  aspirin 81 MG tablet Take 1 tablet (81 mg total) by mouth daily. 05/08/13  Yes Salley Scarlet, MD  BAYER CONTOUR NEXT TEST test strip TEST THREE TIMES A DAY 08/07/13  Yes Salley Scarlet, MD   Insulin Detemir (LEVEMIR FLEXTOUCH) 100 UNIT/ML Pen Inject 45 Units into the skin daily at 10 pm. 10/24/13  Yes Salley Scarlet, MD  insulin lispro (HUMALOG) 100 UNIT/ML KiwkPen Inject 12 Units into the skin 3 (three) times daily with meals. 06/24/13  Yes Salley Scarlet, MD  lisinopril-hydrochlorothiazide (PRINZIDE,ZESTORETIC) 20-12.5 MG per tablet Take 2 tablets by mouth daily. 06/21/13  Yes Salley Scarlet, MD  metFORMIN (GLUCOPHAGE) 1000 MG tablet Take 1 tablet (1,000 mg total) by mouth 2 (two) times daily with a meal. 06/21/13  Yes Salley Scarlet, MD  metoprolol tartrate (LOPRESSOR) 25 MG tablet Take 1 tablet (25 mg total) by mouth 2 (two) times daily. 06/21/13  Yes Salley Scarlet, MD  simvastatin (ZOCOR) 10 MG tablet Take 1 tablet (10 mg total) by mouth at bedtime. 06/21/13  Yes Salley Scarlet, MD  triamcinolone cream (KENALOG) 0.1 % Apply 1 application topically 2 (two) times daily. Apply to leg 08/28/13  Yes Salley Scarlet, MD  gabapentin (NEURONTIN) 300 MG capsule Take 1 capsule (300 mg total) by mouth 3 (three) times daily. Patient not taking: Reported on 12/31/2013 06/21/13   Salley Scarlet, MD    Physical Exam: Filed Vitals:   01/01/14 0534 01/01/14 0552 01/01/14 0803 01/01/14 1329  BP: 161/74 156/78 122/65 130/68  Pulse: 72  70 70  Temp:    98.1 F (36.7 C)  TempSrc:    Oral  Resp:    18  Height:      Weight:      SpO2:    100%    General: Alert, Awake and Oriented to Time, Place and Person. Appear in mild distress Eyes: PERRL ENT: Oral Mucosa clear moist. Neck: no JVD Cardiovascular: S1 and S2 Present, no Murmur, Peripheral Pulses Present Respiratory: Bilateral Air entry equal and Decreased, Clear to Auscultation, noCrackles, no wheezes Abdomen: Bowel Sound present, Soft and non tender Skin: no Rash Extremities: trace Pedal edema, no calf tenderness Neurologic: Grossly no focal neuro deficit. Bilateral lower limb numbness  Labs on Admission:  CBC:  Recent  Labs Lab 12/31/13 1339 01/01/14 0158  WBC 7.6 6.6  NEUTROABS 5.7  --   HGB 12.7* 11.8*  HCT 38.7* 36.0*  MCV 83.2 82.2  PLT 207 211    CMP     Component Value Date/Time   NA 143 01/01/2014 0533   K 4.1 01/01/2014 0533   CL 105 01/01/2014 0533   CO2 25 01/01/2014 0533   GLUCOSE 131* 01/01/2014 0533   BUN 21 01/01/2014 0533   CREATININE 1.24 01/01/2014 0533   CREATININE 1.26 12/03/2013 0848   CALCIUM 9.7 01/01/2014 0533   PROT 6.9 01/01/2014 0533   ALBUMIN 3.4* 01/01/2014 0533   AST 19 01/01/2014 0533   ALT 16 01/01/2014 0533   ALKPHOS 54 01/01/2014 0533   BILITOT 0.7 01/01/2014 0533   GFRNONAA 60* 01/01/2014 0533   GFRNONAA 59* 08/28/2013 1206   GFRAA 69* 01/01/2014 0533   GFRAA 68 08/28/2013 1206    No results for input(s): LIPASE, AMYLASE in the last 168 hours. No results for input(s): AMMONIA in the last 168 hours.  Recent Labs Lab 12/31/13 2051  CKTOTAL 214  TROPONINI <0.30   BNP (last 3 results) No results for input(s): PROBNP in the last 8760 hours.  Radiological Exams on Admission: Dg Cervical Spine 2 Or 3 Views  12/31/2013   CLINICAL DATA:  Posterior neck pain without injury, initial encounter  EXAM: CERVICAL SPINE - 2-3 VIEW  COMPARISON:  None.  FINDINGS: Seven cervical segments are well visualized. Vertebral body height is well maintained. Heavy carotid calcifications are noted bilaterally. No acute fracture or acute facet abnormality is noted. The odontoid is within normal limits.  IMPRESSION: No acute abnormality noted.   Electronically Signed   By: Alcide CleverMark  Lukens M.D.   On: 12/31/2013 22:57   Dg Lumbar Spine 2-3 Views  12/31/2013   CLINICAL DATA:  No injury. Pain down both legs with low back pain and right leg paresthesias.  EXAM: LUMBAR SPINE - 2-3 VIEW  COMPARISON:  06/01/2007  FINDINGS: Normal alignment of the lumbar spine. Mild diffuse degenerative changes predominate involving the facet joints. Minimal endplate hypertrophic changes are present.  No vertebral compression deformities. Intervertebral disc space heights are preserved. Extensive vascular calcifications noted in the aorta and iliac arteries. Surgical clips present in the abdomen and pelvis.  IMPRESSION: No acute bony abnormalities. Normal alignment. Degenerative changes predominate in the facet joints. Vascular calcifications.   Electronically Signed   By: Burman NievesWilliam  Stevens M.D.   On: 12/31/2013 22:58   Ct Head Wo Contrast  12/31/2013   CLINICAL DATA:  Left-sided weakness  EXAM: CT HEAD WITHOUT CONTRAST  TECHNIQUE: Contiguous axial images were obtained from the base of the skull through the vertex without intravenous contrast.  COMPARISON:  None.  FINDINGS: The bony calvarium is intact. No gross soft tissue abnormality is noted. The paranasal sinuses show evidence of a large mucosal retention cyst in the right maxillary antrum. The ventricles are of normal size and configuration. No findings to suggest acute hemorrhage, acute infarction or space-occupying mass lesion is noted.  IMPRESSION: No acute intracranial abnormality noted.   Electronically Signed   By: Alcide CleverMark  Lukens M.D.   On: 12/31/2013 23:21   [place] Venous Img Lower Unilateral Left  12/31/2013   CLINICAL DATA:  63 year old male with left lower extremity pain, swelling and numbness  EXAM: LEFT LOWER EXTREMITY VENOUS DOPPLER ULTRASOUND  TECHNIQUE: Gray-scale sonography with graded compression, as well as color Doppler and duplex ultrasound were performed to evaluate the lower extremity deep venous systems from the level of the common femoral vein and including the common femoral, femoral, profunda femoral, popliteal and calf veins including the posterior tibial, peroneal and gastrocnemius veins when visible. The superficial great saphenous vein was also interrogated. Spectral Doppler was utilized to evaluate flow at rest and with distal augmentation maneuvers in the common femoral, femoral and popliteal veins.  COMPARISON:  Prior duplex  venous ultrasound of the left lower extremity 01/20/2012  FINDINGS: Contralateral Common Femoral Vein: Respiratory phasicity is normal and symmetric with the symptomatic side. No evidence of thrombus. Normal compressibility.  Common Femoral Vein: No evidence of thrombus. Normal compressibility, respiratory phasicity and response to augmentation.  Saphenofemoral Junction: No evidence of thrombus. Normal compressibility and flow on color Doppler imaging.  Profunda Femoral Vein: No evidence of thrombus. Normal compressibility and flow on color Doppler imaging.  Femoral Vein: No evidence of thrombus. Normal compressibility, respiratory phasicity and response to augmentation.  Popliteal Vein: No evidence of thrombus. Normal compressibility, respiratory phasicity and response to augmentation.  Calf Veins: No evidence  of thrombus. Normal compressibility and flow on color Doppler imaging.  Superficial Great Saphenous Vein: No evidence of thrombus. Normal compressibility and flow on color Doppler imaging.  Venous Reflux:  None.  Other Findings:  None.  IMPRESSION: No evidence of deep venous thrombosis.   Electronically Signed   By: Malachy Moan M.D.   On: 12/31/2013 15:09   US Arterial Seg Single  12/31/2013   CLINICAL DATA:  Left leg pain, swelling and numbness. History of aortobifemoral graft. History of lower extremity grafts.  EXAM: NONINVASIVE PHYSIOLOGIC VASCULAR STUDY OF BILATERAL LOWER EXTREMITIES  TECHNIQUE: Evaluation of both lower extremities were performed at rest, including calculation of ankle-brachial indices with single level Doppler, pressure and pulse volume recording.  COMPARISON:  11/24/2011  FINDINGS: Right ABI: The right ankle arteries are not compressible and unable to obtain ankle-brachial indices.  Left ABI:  0.88  Right Lower Extremity: Irregular Doppler waveforms throughout the right ankle.  Left Lower Extremity: Irregular Doppler waveforms throughout the left ankle. The ABI in the  posterior tibial artery is 0.88 and 0.15 from the dorsalis pedis artery.  IMPRESSION: Right ankle-brachial index could not be obtained due to non compressible vessels. Irregular waveforms in the right ankle are similar to the previous examination. Previously, the right ABI was 0.56 and suspect severe peripheral vascular disease in the right lower extremity.  Left ABI is 0.88. This could be an over-estimation because it was previously 0.38. The patient may have had revascularization involving the posterior tibial artery to account for the improved ABI.   Electronically Signed   By: Richarda Overlie M.D.   On: 12/31/2013 15:11    Assessment/Plan Principal Problem:   Lower limb ischemia Active Problems:   Diabetic neuropathy   Essential hypertension   Arteriosclerotic cardiovascular disease (ASCVD)   Cerebrovascular disease   Peripheral vascular disease   Sick sinus syndrome   Obstructive sleep apnea      [facility] Pager: [phone]  01/01/2014, 2:03 PM    If 7PM-7AM, please contact night-coverage [email or web address] Password [email or web address]

## 2014-01-01 NOTE — Progress Notes (Addendum)
Spoke with patient regarding elevated A1C.  He reports that he checks his CBG's 3 times a day and that they are usually 96-125 mg/dL.  Explained that A1C was elevated and that goal A1C is 7%.  He states that he saw diabetes specialist but was unsure of name.  Now on home diabetes regimen and CBG's improved.  He reports taking medications as scheduled but also reported feelings of being down and difficulty with affording food, etc.  PHQ9 done.  Patient scored 16 and social work consult placed. Notified RN and will also notify MD.  Will need follow-up with PCP regarding diabetes. Will follow.  Beryl MeagerJenny Cathern Tahir, RN, BC-ADM Inpatient Diabetes Coordinator Pager [phone]

## 2014-01-01 NOTE — Plan of Care (Signed)
Problem: Phase I Progression Outcomes Goal: Pain controlled with appropriate interventions Outcome: Completed/Met Date Met:  13-Jun-2013

## 2014-01-01 NOTE — Progress Notes (Signed)
ANTICOAGULATION CONSULT NOTE - follow up  Pharmacy Consult for Heparin Indication: leg ischemia  No Known Allergies  Patient Measurements: Height: 5\' 4"  (162.6 cm) Weight: 191 lb 2.2 oz (86.7 kg) IBW/kg (Calculated) : 59.2 Heparin Dosing Weight: 80 kg  Vital Signs: Temp: 98.1 F (36.7 C) (11/18 1329) Temp Source: Oral (11/18 1329) BP: 130/68 mmHg (11/18 1329) Pulse Rate: 70 (11/18 1329)  Labs:  Recent Labs  12/31/13 1339 12/31/13 2051 01/01/14 0158 01/01/14 0200 01/01/14 0533 01/01/14 1207  HGB 12.7*  --  11.8*  --   --   --   HCT 38.7*  --  36.0*  --   --   --   PLT 207  --  211  --   --   --   APTT 28  --   --   --   --   --   LABPROT 13.4  --   --   --   --   --   INR 1.01  --   --   --   --   --   HEPARINUNFRC  --   --   --  0.56  --  0.93*  CREATININE 1.28  --   --   --  1.24  --   CKTOTAL  --  214  --   --   --   --   TROPONINI  --  <0.30  --   --   --   --     Estimated Creatinine Clearance: 59.8 mL/min (by C-G formula based on Cr of 1.24).  Assessment: 63 yo male with lower limb ischemia, possible bypass graft thrombosis, awaiting arteriogram. He is receiving Heparin infusion 1300 units/hr and current  heparin level is now 0.93.  Pt reports lab had difficulty obtaining enough blood and drew blood from left arm and right arm but below site of IV heparin infusion.  No bleeding reported. hgb 11.9 <12.7 and pltc stable at 211K.    Dr. Susie CassetteAbrol notes that the plan is for an aortogram and runoff tomorrow via a right femoral approach. Based on this study He will likely require either thrombectomy of the left limb of his graft, versus a right to left femorofemoral bypass graft, versus an axillobifemoral bypass. Surgery tomorrow or Friday.  Goal of Therapy:  Heparin level 0.3-0.7 units/ml Monitor platelets by anticoagulation protocol: Yes   Plan:  Decrease Heparin rate to 1100 units/hr Recheck level in ~6h then daily heparin level and CBC.   Noah Delaineuth Tucker Steedley,  RPh Clinical Pharmacist Pager: [phone] 01/01/2014 3:00 PM

## 2014-01-01 NOTE — Progress Notes (Signed)
ANTICOAGULATION CONSULT NOTE - Follow Up Consult  Pharmacy Consult for heparin  Indication: LE ischemia  No Known Allergies  Patient Measurements: Height: 5\' 4"  (162.6 cm) Weight: 191 lb 2.2 oz (86.7 kg) IBW/kg (Calculated) : 59.2 Heparin Dosing Weight:   Vital Signs: Temp: 97.8 F (36.6 C) (11/18 2005) Temp Source: Oral (11/18 2005) BP: 141/71 mmHg (11/18 2005) Pulse Rate: 70 (11/18 2005)  Labs:  Recent Labs  12/31/13 1339 12/31/13 2051 01/01/14 0158 01/01/14 0200 01/01/14 0533 01/01/14 1207 01/01/14 2250  HGB 12.7*  --  11.8*  --   --   --   --   HCT 38.7*  --  36.0*  --   --   --   --   PLT 207  --  211  --   --   --   --   APTT 28  --   --   --   --   --   --   LABPROT 13.4  --   --   --   --   --   --   INR 1.01  --   --   --   --   --   --   HEPARINUNFRC  --   --   --  0.56  --  0.93* 0.52  CREATININE 1.28  --   --   --  1.24  --   --   CKTOTAL  --  214  --   --   --   --   --   TROPONINI  --  <0.30  --   --   --   --   --     Estimated Creatinine Clearance: 59.8 mL/min (by C-G formula based on Cr of 1.24).   Medications:  Heparin infusing at 1100 units/hr   Assessment: Heparin level is therapeutic at 0.52 IU/ml with no bleeding noted  Goal of Therapy:  HL 0.3-0.7    Plan:  Continue at 1100 units/hr and f/u am labs.   Janice CoffinEarl, Vashon Jonathan 01/01/2014,11:36 PM

## 2014-01-01 NOTE — Plan of Care (Signed)
Problem: Phase I Progression Outcomes Goal: Voiding-avoid urinary catheter unless indicated Outcome: Not Applicable Date Met:  01/01/14     

## 2014-01-01 NOTE — Progress Notes (Signed)
ANTICOAGULATION CONSULT NOTE   Pharmacy Consult for Heparin Indication: leg ischemia  No Known Allergies  Patient Measurements: Height: 5\' 4"  (162.6 cm) Weight: 188 lb 0.8 oz (85.3 kg) IBW/kg (Calculated) : 59.2 Heparin Dosing Weight: 80 kg  Vital Signs: Temp: 98.4 F (36.9 C) (11/17 2140) Temp Source: Oral (11/17 2140) BP: 163/78 mmHg (11/18 0025) Pulse Rate: 70 (11/18 0025)  Labs:  Recent Labs  12/31/13 1339 12/31/13 2051 01/01/14 0158 01/01/14 0200  HGB 12.7*  --  11.8*  --   HCT 38.7*  --  36.0*  --   PLT 207  --  211  --   APTT 28  --   --   --   LABPROT 13.4  --   --   --   INR 1.01  --   --   --   HEPARINUNFRC  --   --   --  0.56  CREATININE 1.28  --   --   --   CKTOTAL  --  214  --   --   TROPONINI  --  <0.30  --   --     Estimated Creatinine Clearance: 57.4 mL/min (by C-G formula based on Cr of 1.28).  Assessment: 63 yo male with possible bypass graft thrombosis, awaiting arteriogram, for heparin  Goal of Therapy:  Heparin level 0.3-0.7 units/ml Monitor platelets by anticoagulation protocol: Yes   Plan:  Continue Heparin at current rate  Recheck level in 8 hrs to verify  Geannie RisenGreg Alohilani Levenhagen, PharmD, BCPS 01/01/2014 3:17 AM

## 2014-01-02 ENCOUNTER — Encounter (HOSPITAL_COMMUNITY)
Admission: EM | Disposition: A | Discharge status: Skilled Nursing Facility | Payer: Self-pay | Source: Home / Self Care | Attending: Internal Medicine

## 2014-01-02 DIAGNOSIS — I70422 Atherosclerosis of autologous vein bypass graft(s) of the extremities with rest pain, left leg: Secondary | ICD-10-CM

## 2014-01-02 HISTORY — PX: ABDOMINAL ANGIOGRAM: SHX5499

## 2014-01-02 HISTORY — PX: ARCH AORTOGRAM: SHX5501

## 2014-01-02 HISTORY — PX: PERIPHERAL VASCULAR CATHETERIZATION: SHX172C

## 2014-01-02 LAB — CBC
HEMATOCRIT: 35.7 % — AB (ref 39.0–52.0)
Hemoglobin: 11.4 g/dL — ABNORMAL LOW (ref 13.0–17.0)
MCH: 26.5 pg (ref 26.0–34.0)
MCHC: 31.9 g/dL (ref 30.0–36.0)
MCV: 83 fL (ref 78.0–100.0)
Platelets: 212 10*3/uL (ref 150–400)
RBC: 4.3 MIL/uL (ref 4.22–5.81)
RDW: 12.3 % (ref 11.5–15.5)
WBC: 6.4 10*3/uL (ref 4.0–10.5)

## 2014-01-02 LAB — GLUCOSE, CAPILLARY
GLUCOSE-CAPILLARY: 92 mg/dL (ref 70–99)
Glucose-Capillary: 148 mg/dL — ABNORMAL HIGH (ref 70–99)
Glucose-Capillary: 221 mg/dL — ABNORMAL HIGH (ref 70–99)
Glucose-Capillary: 251 mg/dL — ABNORMAL HIGH (ref 70–99)
Glucose-Capillary: 275 mg/dL — ABNORMAL HIGH (ref 70–99)
Glucose-Capillary: 89 mg/dL (ref 70–99)

## 2014-01-02 LAB — HEPARIN LEVEL (UNFRACTIONATED): Heparin Unfractionated: 0.44 IU/mL (ref 0.30–0.70)

## 2014-01-02 LAB — CREATININE, SERUM
CREATININE: 1.1 mg/dL (ref 0.50–1.35)
GFR, EST AFRICAN AMERICAN: 80 mL/min — AB (ref >90)
GFR, EST NON AFRICAN AMERICAN: 69 mL/min — AB (ref >90)

## 2014-01-02 LAB — SURGICAL PCR SCREEN
MRSA, PCR: NEGATIVE
STAPHYLOCOCCUS AUREUS: NEGATIVE

## 2014-01-02 SURGERY — ABDOMINAL ANGIOGRAM
Anesthesia: LOCAL

## 2014-01-02 MED ORDER — LABETALOL HCL 5 MG/ML IV SOLN
10.0000 mg | INTRAVENOUS | Status: DC | PRN
  Administered 2014-01-02: 10 mg via INTRAVENOUS
  Filled 2014-01-02 (×2): qty 4

## 2014-01-02 MED ORDER — LIDOCAINE HCL (PF) 1 % IJ SOLN
INTRAMUSCULAR | Status: AC
  Filled 2014-01-02: qty 30

## 2014-01-02 MED ORDER — HEPARIN (PORCINE) IN NACL 100-0.45 UNIT/ML-% IJ SOLN
1100.0000 [IU]/h | INTRAMUSCULAR | Status: AC
  Filled 2014-01-02: qty 250

## 2014-01-02 MED ORDER — LABETALOL HCL 5 MG/ML IV SOLN
INTRAVENOUS | Status: AC
  Filled 2014-01-02: qty 4

## 2014-01-02 MED ORDER — OXYCODONE-ACETAMINOPHEN 5-325 MG PO TABS
1.0000 | ORAL_TABLET | ORAL | Status: DC | PRN
  Administered 2014-01-02 (×2): 2 via ORAL
  Filled 2014-01-02 (×2): qty 2

## 2014-01-02 MED ORDER — MIDAZOLAM HCL 2 MG/2ML IJ SOLN
INTRAMUSCULAR | Status: AC
  Filled 2014-01-02: qty 2

## 2014-01-02 MED ORDER — MORPHINE SULFATE 4 MG/ML IJ SOLN
4.0000 mg | INTRAMUSCULAR | Status: AC
  Administered 2014-01-02: 4 mg via INTRAVENOUS
  Filled 2014-01-02: qty 1

## 2014-01-02 MED ORDER — CEFAZOLIN SODIUM 1-5 GM-% IV SOLN
1.0000 g | INTRAVENOUS | Status: AC
  Administered 2014-01-03 (×2): 1 g via INTRAVENOUS
  Filled 2014-01-02: qty 50

## 2014-01-02 MED ORDER — HYDRALAZINE HCL 20 MG/ML IJ SOLN
INTRAMUSCULAR | Status: AC
  Filled 2014-01-02: qty 1

## 2014-01-02 MED ORDER — ONDANSETRON HCL 4 MG/2ML IJ SOLN: 4.0000 mg | Freq: Four times a day (QID) | INTRAMUSCULAR | Status: DC | PRN

## 2014-01-02 MED ORDER — SODIUM CHLORIDE 0.9 % IV SOLN: 1.0000 mL/kg/h | INTRAVENOUS | Status: AC

## 2014-01-02 MED ORDER — HYDROMORPHONE HCL 1 MG/ML IJ SOLN
2.0000 mg | INTRAMUSCULAR | Status: AC | PRN
  Administered 2014-01-03 (×2): 2 mg via INTRAVENOUS
  Filled 2014-01-02 (×2): qty 2

## 2014-01-02 MED ORDER — HEPARIN (PORCINE) IN NACL 2-0.9 UNIT/ML-% IJ SOLN
INTRAMUSCULAR | Status: AC
  Filled 2014-01-02: qty 1000

## 2014-01-02 MED ORDER — FENTANYL CITRATE 0.05 MG/ML IJ SOLN
INTRAMUSCULAR | Status: AC
  Filled 2014-01-02: qty 2

## 2014-01-02 MED ORDER — ACETAMINOPHEN 325 MG PO TABS: 650.0000 mg | ORAL_TABLET | ORAL | Status: DC | PRN

## 2014-01-02 MED ORDER — ENOXAPARIN SODIUM 40 MG/0.4ML ~~LOC~~ SOLN
40.0000 mg | SUBCUTANEOUS | Status: DC
  Filled 2014-01-02 (×2): qty 0.4

## 2014-01-02 MED ORDER — HYDRALAZINE HCL 20 MG/ML IJ SOLN
10.0000 mg | INTRAMUSCULAR | Status: DC | PRN
  Administered 2014-01-02 – 2014-01-03 (×4): 10 mg via INTRAVENOUS
  Filled 2014-01-02 (×3): qty 1

## 2014-01-02 MED ORDER — MORPHINE SULFATE 2 MG/ML IJ SOLN: 2.0000 mg | INTRAMUSCULAR | Status: DC | PRN

## 2014-01-02 NOTE — Plan of Care (Signed)
Problem: Phase I Progression Outcomes Goal: Hemodynamically stable Outcome: Completed/Met Date Met:  01/02/14

## 2014-01-02 NOTE — Progress Notes (Signed)
Pt eating Malaw[place]key sandwich. Awaiting room assignment.

## 2014-01-02 NOTE — Progress Notes (Signed)
   VASCULAR SURGERY ASSESSMENT & PLAN:  * For arteriogram today by Dr. Imogene Burnhen. I have ordered to stop the heparin at 7 AM. Based on the results of the arteriogram I plan on surgery tomorrow. The options will likely be thrombectomy of the left limb of his aortofemoral bypass graft versus right to left femorofemoral bypass graft versus left axillobifemoral bypass graft.  * I have reviewed the indications for the arteriogram and the potential complications including but not limited to bleeding, arterial injury, kidney failure, and reaction to the dye. In addition I discussed the potential complications of surgery including, but not limited to, wound healing problems, wound infection, graft infection, cardiac complications, graft thrombosis, and limb loss. However, I think without attempted revascularization he is at very high-risk for limb loss.  * I will make final recommendations pending the results of his arteriogram.  SUBJECTIVE: he still notes some paresthesias in his left foot.  PHYSICAL EXAM: Filed Vitals:   01/01/14 0803 01/01/14 1329 01/01/14 2005 01/02/14 0513  BP: 122/65 130/68 141/71 167/84  Pulse: 70 70 70 70  Temp:  98.1 F (36.7 C) 97.8 F (36.6 C) 97.7 F (36.5 C)  TempSrc:  Oral Oral Oral  Resp:  18 18 16   Height:      Weight:    191 lb (86.637 kg)  SpO2:  100% 98% 94%   No changes exam. He has a palpable right femoral pulse.  LABS: Lab Results  Component Value Date   WBC 6.4 01/02/2014   HGB 11.4* 01/02/2014   HCT 35.7* 01/02/2014   MCV 83.0 01/02/2014   PLT 212 01/02/2014   Lab Results  Component Value Date   CREATININE 1.24 01/01/2014   Lab Results  Component Value Date   INR 1.01 12/31/2013   CBG (last 3)   Recent Labs  01/01/14 0702 01/01/14 1136 01/01/14 1632  GLUCAP 134* 235* 163*    Principal Problem:   Lower limb ischemia Active Problems:   Diabetic neuropathy   Essential hypertension   Arteriosclerotic cardiovascular disease (ASCVD)  Cerebrovascular disease   Peripheral vascular disease   Sick sinus syndrome   Obstructive sleep apnea   Cari CarawayChris Leonid Manus Beeper: [phone] 01/02/2014

## 2014-01-02 NOTE — Progress Notes (Signed)
Consulting with cath lab supervisors as to hypertension,no IV access, indwelling arterial sheath, and the prospect of surgery in AM. Sheath to be secured by Lance BoschBryan Robinson (sutured) and placed to pressure with NS with transpack tubing.

## 2014-01-02 NOTE — Progress Notes (Signed)
Pt arrived to unit on stretcher via cath lab staff. R groin sheath intact. No other IV access. Pt AOx4, BP elevated. Will continue to monitor. Unable to palpate pedal pulses, awaiting doppler to assess. Pt c/o L leg pain. PPM present, HR 70s A paced. Family at bedside, report given to night nurse.

## 2014-01-02 NOTE — Progress Notes (Addendum)
[facility] History and Physical  Patient: [name]  ZOX:[id number]  DOB: 01/23/1951  DOS: the patient was seen and examined on 01/02/2014 PCP: [name], [name], MD  Chief Complaint: left leg pain.  HPI: [name] is a 63 y.o. male with Past medical history of PVD, CAD, DM-2, HTN, ANEMIA, CKD, OSA, BPH, pacemaker implant. The patient presented with complaints of left leg pain. He mentions that he woke up with the symptoms in the morning the pain was started having this high and was radiating to his leg. While the day his pain was progressively worsening and therefore he decided to [facility]. In any event he was found to have abnormal ABI and vascular surgery was consulted. Vascular surgery has followed up with the patient and recommend further workup and has requested the patient to be admitted to medicine service. At the time of my evaluation patient mentions the pain in the left leg has.  He is a former smoker  The patient is coming from home. And at his baseline independent for most of his ADL.  Subjective No complaints  Assessment and plan  1. Lower limb ischemia The patient is presenting with complaints of left leg pain. The plan will be for an aortogram and runoff 11/19 via a right femoral approach. Based on this study He will likely require either thrombectomy of the left limb of his graft, versus a right to left femorofemoral bypass graft, versus an axillobifemoral bypass. Arteriogram today Gentle hydration Continue IV heparin as per vascular surgery recommendations Discontinue ACE inhibitor in anticipation of contrast procedure   2.diabetes mellitus. Likely uncontrolled. Hemoglobin A1c significantly elevated. Continue Levemir and SSI  3.bilateral paresthesia. Patient is found to have bilateral sounds and numbness of the leg. As well as he has some mild numbness of his fingers. CT of the head is negative for any acute  abnormality as well as x-rays cervical spine as well as x-ray lumbar spine. Continue gabapentin   4.coronary artery disease. Pacemaker implant. Continue with aspirin monitoring him on telemetry. Patient did have some chest pain 2 weeks ago but denies any chest pain on and off.     5. Accelerated hypertension. Blood pressure mildly elevated., DC ACE inhibitor, HCTZ in the setting of contrast study Started on prn hydralazine  6. Depression Psych Social work consulted Patient will need outpatient psych evaluation Currently denies suicidal homicidal ideation   Advance goals of care discussion: full code as per my discussion with patient   Consults: vascular surgery  DVT Prophylaxis: on chronic anticoagulation Nutrition: nothing by mouth after midnight  Family Communication: family was present at bedside, opportunity was given to ask question and all questions were answered satisfactorily at the time of interview. Disposition: Admitted to inpatient in telemetry unit.       Prior to Admission medications   Medication Sig Start Date End Date Taking? Authorizing Provider  acetaminophen (TYLENOL) 500 MG tablet Take 2 tablets (1,000 mg total) by mouth every 6 (six) hours as needed. Pain. 05/08/13  Yes Salley ScarletKawanta F [place], MD  albuterol (PROVENTIL HFA;VENTOLIN HFA) 108 (90 BASE) MCG/ACT inhaler Inhale 2 puffs into the lungs every 4 (four) hours as needed. 06/21/13  Yes Salley ScarletKawanta F Cando, MD  aspirin 81 MG tablet Take 1 tablet (81 mg total) by mouth daily. 05/08/13  Yes Salley ScarletKawanta F Markham, MD  BAYER CONTOUR NEXT TEST test strip TEST THREE TIMES A DAY 08/07/13  Yes Salley ScarletKawanta F Wright, MD  Insulin  Detemir (LEVEMIR FLEXTOUCH) 100 UNIT/ML Pen Inject 45 Units into the skin daily at 10 pm. 10/24/13  Yes Salley Scarlet, MD  insulin lispro (HUMALOG) 100 UNIT/ML KiwkPen Inject 12 Units into the skin 3 (three) times daily with meals. 06/24/13  Yes Salley Scarlet, MD  lisinopril-hydrochlorothiazide  (PRINZIDE,ZESTORETIC) 20-12.5 MG per tablet Take 2 tablets by mouth daily. 06/21/13  Yes Salley Scarlet, MD  metFORMIN (GLUCOPHAGE) 1000 MG tablet Take 1 tablet (1,000 mg total) by mouth 2 (two) times daily with a meal. 06/21/13  Yes Salley Scarlet, MD  metoprolol tartrate (LOPRESSOR) 25 MG tablet Take 1 tablet (25 mg total) by mouth 2 (two) times daily. 06/21/13  Yes Salley Scarlet, MD  simvastatin (ZOCOR) 10 MG tablet Take 1 tablet (10 mg total) by mouth at bedtime. 06/21/13  Yes Salley Scarlet, MD  triamcinolone cream (KENALOG) 0.1 % Apply 1 application topically 2 (two) times daily. Apply to leg 08/28/13  Yes Salley Scarlet, MD  gabapentin (NEURONTIN) 300 MG capsule Take 1 capsule (300 mg total) by mouth 3 (three) times daily. Patient not taking: Reported on 12/31/2013 06/21/13   Salley Scarlet, MD    Physical Exam: Filed Vitals:   01/01/14 1329 01/01/14 2005 01/02/14 0513 01/02/14 1018  BP: 130/68 141/71 167/84 154/83  Pulse: 70 70 70 71  Temp: 98.1 F (36.7 C) 97.8 F (36.6 C) 97.7 F (36.5 C)   TempSrc: Oral Oral Oral   Resp: 18 18 16    Height:      Weight:   86.637 kg (191 lb)   SpO2: 100% 98% 94%     General: Alert, Awake and Oriented to Time, Place and Person. Appear in mild distress Eyes: PERRL ENT: Oral Mucosa clear moist. Neck: no JVD Cardiovascular: S1 and S2 Present, no Murmur, Peripheral Pulses Present Respiratory: Bilateral Air entry equal and Decreased, Clear to Auscultation, noCrackles, no wheezes Abdomen: Bowel Sound present, Soft and non tender Skin: no Rash Extremities: trace Pedal edema, no calf tenderness Neurologic: Grossly no focal neuro deficit. Bilateral lower limb numbness  Labs on Admission:  CBC:  Recent Labs Lab 12/31/13 1339 01/01/14 0158 01/02/14 0439  WBC 7.6 6.6 6.4  NEUTROABS 5.7  --   --   HGB 12.7* 11.8* 11.4*  HCT 38.7* 36.0* 35.7*  MCV 83.2 82.2 83.0  PLT 207 211 212    CMP     Component Value Date/Time   NA 143  01/01/2014 0533   K 4.1 01/01/2014 0533   CL 105 01/01/2014 0533   CO2 25 01/01/2014 0533   GLUCOSE 131* 01/01/2014 0533   BUN 21 01/01/2014 0533   CREATININE 1.24 01/01/2014 0533   CREATININE 1.26 12/03/2013 0848   CALCIUM 9.7 01/01/2014 0533   PROT 6.9 01/01/2014 0533   ALBUMIN 3.4* 01/01/2014 0533   AST 19 01/01/2014 0533   ALT 16 01/01/2014 0533   ALKPHOS 54 01/01/2014 0533   BILITOT 0.7 01/01/2014 0533   GFRNONAA 60* 01/01/2014 0533   GFRNONAA 59* 08/28/2013 1206   GFRAA 69* 01/01/2014 0533   GFRAA 68 08/28/2013 1206    No results for input(s): LIPASE, AMYLASE in the last 168 hours. No results for input(s): AMMONIA in the last 168 hours.   Recent Labs Lab 12/31/13 2051  CKTOTAL 214  TROPONINI <0.30   BNP (last 3 results) No results for input(s): PROBNP in the last 8760 hours.  Radiological Exams on Admission: Dg Cervical Spine 2 Or 3 Views  12/31/2013  CLINICAL DATA:  Posterior neck pain without injury, initial encounter  EXAM: CERVICAL SPINE - 2-3 VIEW  COMPARISON:  None.  FINDINGS: Seven cervical segments are well visualized. Vertebral body height is well maintained. Heavy carotid calcifications are noted bilaterally. No acute fracture or acute facet abnormality is noted. The odontoid is within normal limits.  IMPRESSION: No acute abnormality noted.   Electronically Signed   By: Alcide CleverMark  Lukens M.D.   On: 12/31/2013 22:57   Dg Lumbar Spine 2-3 Views  12/31/2013   CLINICAL DATA:  No injury. Pain down both legs with low back pain and right leg paresthesias.  EXAM: LUMBAR SPINE - 2-3 VIEW  COMPARISON:  06/01/2007  FINDINGS: Normal alignment of the lumbar spine. Mild diffuse degenerative changes predominate involving the facet joints. Minimal endplate hypertrophic changes are present. No vertebral compression deformities. Intervertebral disc space heights are preserved. Extensive vascular calcifications noted in the aorta and iliac arteries. Surgical clips present in the  abdomen and pelvis.  IMPRESSION: No acute bony abnormalities. Normal alignment. Degenerative changes predominate in the facet joints. Vascular calcifications.   Electronically Signed   By: Burman NievesWilliam  Stevens M.D.   On: 12/31/2013 22:58   Ct Head Wo Contrast  12/31/2013   CLINICAL DATA:  Left-sided weakness  EXAM: CT HEAD WITHOUT CONTRAST  TECHNIQUE: Contiguous axial images were obtained from the base of the skull through the vertex without intravenous contrast.  COMPARISON:  None.  FINDINGS: The bony calvarium is intact. No gross soft tissue abnormality is noted. The paranasal sinuses show evidence of a large mucosal retention cyst in the right maxillary antrum. The ventricles are of normal size and configuration. No findings to suggest acute hemorrhage, acute infarction or space-occupying mass lesion is noted.  IMPRESSION: No acute intracranial abnormality noted.   Electronically Signed   By: Alcide CleverMark  Lukens M.D.   On: 12/31/2013 23:21   [place] Venous Img Lower Unilateral Left  12/31/2013   CLINICAL DATA:  63 year old male with left lower extremity pain, swelling and numbness  EXAM: LEFT LOWER EXTREMITY VENOUS DOPPLER ULTRASOUND  TECHNIQUE: Gray-scale sonography with graded compression, as well as color Doppler and duplex ultrasound were performed to evaluate the lower extremity deep venous systems from the level of the common femoral vein and including the common femoral, femoral, profunda femoral, popliteal and calf veins including the posterior tibial, peroneal and gastrocnemius veins when visible. The superficial great saphenous vein was also interrogated. Spectral Doppler was utilized to evaluate flow at rest and with distal augmentation maneuvers in the common femoral, femoral and popliteal veins.  COMPARISON:  Prior duplex venous ultrasound of the left lower extremity 01/20/2012  FINDINGS: Contralateral Common Femoral Vein: Respiratory phasicity is normal and symmetric with the symptomatic side. No evidence  of thrombus. Normal compressibility.  Common Femoral Vein: No evidence of thrombus. Normal compressibility, respiratory phasicity and response to augmentation.  Saphenofemoral Junction: No evidence of thrombus. Normal compressibility and flow on color Doppler imaging.  Profunda Femoral Vein: No evidence of thrombus. Normal compressibility and flow on color Doppler imaging.  Femoral Vein: No evidence of thrombus. Normal compressibility, respiratory phasicity and response to augmentation.  Popliteal Vein: No evidence of thrombus. Normal compressibility, respiratory phasicity and response to augmentation.  Calf Veins: No evidence of thrombus. Normal compressibility and flow on color Doppler imaging.  Superficial Great Saphenous Vein: No evidence of thrombus. Normal compressibility and flow on color Doppler imaging.  Venous Reflux:  None.  Other Findings:  None.  IMPRESSION: No evidence of deep  venous thrombosis.   Electronically Signed   By: Malachy Moan M.D.   On: 12/31/2013 15:09   US Arterial Seg Single  12/31/2013   CLINICAL DATA:  Left leg pain, swelling and numbness. History of aortobifemoral graft. History of lower extremity grafts.  EXAM: NONINVASIVE PHYSIOLOGIC VASCULAR STUDY OF BILATERAL LOWER EXTREMITIES  TECHNIQUE: Evaluation of both lower extremities were performed at rest, including calculation of ankle-brachial indices with single level Doppler, pressure and pulse volume recording.  COMPARISON:  11/24/2011  FINDINGS: Right ABI: The right ankle arteries are not compressible and unable to obtain ankle-brachial indices.  Left ABI:  0.88  Right Lower Extremity: Irregular Doppler waveforms throughout the right ankle.  Left Lower Extremity: Irregular Doppler waveforms throughout the left ankle. The ABI in the posterior tibial artery is 0.88 and 0.15 from the dorsalis pedis artery.  IMPRESSION: Right ankle-brachial index could not be obtained due to non compressible vessels. Irregular waveforms in the  right ankle are similar to the previous examination. Previously, the right ABI was 0.56 and suspect severe peripheral vascular disease in the right lower extremity.  Left ABI is 0.88. This could be an over-estimation because it was previously 0.38. The patient may have had revascularization involving the posterior tibial artery to account for the improved ABI.   Electronically Signed   By: Richarda Overlie M.D.   On: 12/31/2013 15:11    Assessment/Plan Principal Problem:   Lower limb ischemia Active Problems:   Diabetic neuropathy   Essential hypertension   Arteriosclerotic cardiovascular disease (ASCVD)   Cerebrovascular disease   Peripheral vascular disease   Sick sinus syndrome   Obstructive sleep apnea      [facility] Pager: [phone]  01/02/2014, 10:41 AM    If 7PM-7AM, please contact night-coverage [email or web address] Password [email or web address]

## 2014-01-02 NOTE — Progress Notes (Addendum)
ANTICOAGULATION CONSULT NOTE - Follow Up Consult  Pharmacy Consult for heparin  Indication: LE ischemia  No Known Allergies  Patient Measurements: Height: 5\' 4"  (162.6 cm) Weight: 191 lb (86.637 kg) IBW/kg (Calculated) : 59.2 Heparin Dosing Weight:   Vital Signs: Temp: 97.3 F (36.3 C) (11/19 1323) Temp Source: Oral (11/19 1323) BP: 148/84 mmHg (11/19 1323) Pulse Rate: 75 (11/19 1553)  Labs:  Recent Labs  12/31/13 1339 12/31/13 2051 01/01/14 0158  01/01/14 0533 01/01/14 1207 01/01/14 2250 01/02/14 0439  HGB 12.7*  --  11.8*  --   --   --   --  11.4*  HCT 38.7*  --  36.0*  --   --   --   --  35.7*  PLT 207  --  211  --   --   --   --  212  APTT 28  --   --   --   --   --   --   --   LABPROT 13.4  --   --   --   --   --   --   --   INR 1.01  --   --   --   --   --   --   --   HEPARINUNFRC  --   --   --   < >  --  0.93* 0.52 0.44  CREATININE 1.28  --   --   --  1.24  --   --   --   CKTOTAL  --  214  --   --   --   --   --   --   TROPONINI  --  <0.30  --   --   --   --   --   --   < > = values in this interval not displayed.  Estimated Creatinine Clearance: 59.8 mL/min (by C-G formula based on Cr of 1.24).   Medications:  Heparin infusing at 1100 units/hr   Assessment: 63 y.o male with lower limb ischemia. Currently off floor for aortogram and runoff .  This morning the heparin level was therapeutic at 0.44 IU/ml on Heparin IV drip at 1100 units/hr with no bleeding noted .  Hgb 11.4 <12.7, pltc stable at 212K.  Heparin was stopped per VVS orders at 07:00 for aortogram.   Goal of Therapy:  HL 0.3-0.7    Plan:  F/u for restart of heparin after aortogram If heparin resumed, VVS noted to stop Heparin drip tomorrw @ 07:00 for surgery tomorrow.   Noah Delaineuth Demetris Capell, RPh Clinical Pharmacist Pager: [phone] 01/02/2014,4:52 PM

## 2014-01-02 NOTE — Progress Notes (Signed)
VASCULAR SURGERY:  I have reviewed his arteriogram. The left limb of his aortobifemoral bypass graft is occluded. It is difficult to say if this is chronic or not. He has bilateral superficial femoral artery occlusions. He also has severe disease of his common femoral artery on the right. I suspect he had disease on the left which caused the left limb to occlude. The best option will likely be a right to left femorofemoral bypass graft. He could potentially require bilateral common femoral artery endarterectomies with vein patch if this is technically feasible.  Waverly Ferrarihristopher Micalah Cabezas, MD, FACS Beeper [phone] 01/02/2014

## 2014-01-02 NOTE — Plan of Care (Signed)
Problem: Phase II Progression Outcomes Goal: Vital signs remain stable Outcome: Progressing     

## 2014-01-02 NOTE — Op Note (Signed)
OPERATIVE NOTE   PROCEDURE: 1.  Right common femoral artery cannulation under ultrasound guidance 2.  Placement of catheter in aorta 3.  Abdominal aortogram 4.  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  Distal subclavian artery stenosis ~50%  Abdominal Aorta: patent, patent aortobifemoral body and right limb, occluded left limb  Superior mesenteric artery: patent Celiac artery: patent   Right Left  RA patent patent  CIA Not visualized, patent aortobifemoral limb Not visualized, occluded aortobifemoral limb  EIA Not visualized, patent aortobifemoral limb Not visualized, occluded aortobifemoral limb    IIA Not visualized, patent aortobifemoral limb   Not visualized, occluded aortobifemoral limb    CFA Patent with distal stenosis >75% Not visualized  SFA Occluded Occluded  PFA Patent Reconstitutes from collaterals   SPECIMEN(S):  none  INDICATIONS:   [name] is a 63 y.o. male who presents with likely occluded left aortobifemoral limb.  The patient presents for: aortogram, arch aortogram.  I discussed with the patient the nature of angiographic procedures, especially the limited patencies of any endovascular intervention.  The patient is aware of that the risks of an angiographic procedure include but are not limited to: bleeding, infection, access site complications, renal failure, embolization, rupture of vessel, dissection, possible need for emergent surgical intervention, possible need for surgical procedures to treat the patient's pathology, and stroke and death.  The patient is aware of the risks and agrees to proceed.  DESCRIPTION: After full informed consent was  obtained from the patient, the patient was brought back to the angiography suite.  The patient was placed supine upon the angiography table and connected to monitoring equipment.  The patient was then given conscious sedation, the amounts of which are documented in the patient's chart.  The patient was prepped and drape in the standard fashion for an angiographic procedure.  At this point, attention was turned to the right groin.  Under ultrasound guidance, the aortobifemoral limb was cannulated with a micropuncture needle.  The microwire was advanced into the graft.  The needle was exchanged for a microsheath, but I could not load the sheath into the heavily scarred aortobifemoral limb.  The microwire was bent in this process of advancing the microsheath.  I pulled out the sheath and wire and held pressure for 10 minutes.  I then cannulated the limb with a 18-gauge needle under ultrasound guidance.  I placed an Amplatz wire into the graft and a 5-F sheath was loaded over the wire in the right aortobifemoral sheath.  A long pigtail was loaded over the wire up to the level of L1.  The catheter was connected to the power injector circuit.  After de-airring and de-clotting the circuit, a power injector aortogram was completed.  I then pulled the catheter down to proximal to the Inferior mesenteric artery which appeared to be perfusing collaterals in the pelvis.  A pelvic injection was completed which demonstrated reconstitution of profunda collaterals in left groin.  I then advanced the pigtail in the ascending aorta and did an arch aortogram at 40 degrees LAO.  The findings are listed above.     COMPLICATIONS: none  CONDITION: stable  Leonides SakeBrian Samanth Mirkin, MD [facility] Office: [phone] Pager:  [phone]  01/02/2014, 4:49 PM

## 2014-01-03 ENCOUNTER — Inpatient Hospital Stay (HOSPITAL_COMMUNITY): Payer: Medicare HMO | Admitting: Anesthesiology

## 2014-01-03 ENCOUNTER — Encounter (HOSPITAL_COMMUNITY): Payer: Self-pay | Admitting: Anesthesiology

## 2014-01-03 ENCOUNTER — Encounter (HOSPITAL_COMMUNITY)
Admission: EM | Disposition: A | Discharge status: Skilled Nursing Facility | Payer: Self-pay | Source: Home / Self Care | Attending: Internal Medicine

## 2014-01-03 ENCOUNTER — Inpatient Hospital Stay (HOSPITAL_COMMUNITY): Payer: Medicare HMO

## 2014-01-03 HISTORY — PX: ENDARTERECTOMY FEMORAL: SHX5804

## 2014-01-03 HISTORY — PX: INTRAOPERATIVE ARTERIOGRAM: SHX5157

## 2014-01-03 HISTORY — PX: FEMORAL-FEMORAL BYPASS GRAFT: SHX936

## 2014-01-03 LAB — GLUCOSE, CAPILLARY
GLUCOSE-CAPILLARY: 275 mg/dL — AB (ref 70–99)
GLUCOSE-CAPILLARY: 107 mg/dL — AB (ref 70–99)
GLUCOSE-CAPILLARY: 128 mg/dL — AB (ref 70–99)
Glucose-Capillary: 269 mg/dL — ABNORMAL HIGH (ref 70–99)
Glucose-Capillary: 232 mg/dL — ABNORMAL HIGH (ref 70–99)
Glucose-Capillary: 210 mg/dL — ABNORMAL HIGH (ref 70–99)
Glucose-Capillary: 155 mg/dL — ABNORMAL HIGH (ref 70–99)
Glucose-Capillary: 89 mg/dL (ref 70–99)
Glucose-Capillary: 81 mg/dL (ref 70–99)
Glucose-Capillary: 177 mg/dL — ABNORMAL HIGH (ref 70–99)
Glucose-Capillary: 187 mg/dL — ABNORMAL HIGH (ref 70–99)

## 2014-01-03 LAB — PROTIME-INR
INR: 1.02 (ref 0.00–1.49)
Prothrombin Time: 13.5 seconds (ref 11.6–15.2)

## 2014-01-03 LAB — COMPREHENSIVE METABOLIC PANEL
ALBUMIN: 3.6 g/dL (ref 3.5–5.2)
ALK PHOS: 51 U/L (ref 39–117)
ALT: 16 U/L (ref 0–53)
AST: 21 U/L (ref 0–37)
Anion gap: 14 (ref 5–15)
BILIRUBIN TOTAL: 0.5 mg/dL (ref 0.3–1.2)
BUN: 16 mg/dL (ref 6–23)
CHLORIDE: 101 meq/L (ref 96–112)
CO2: 25 mEq/L (ref 19–32)
Calcium: 9.9 mg/dL (ref 8.4–10.5)
Creatinine, Ser: 1.11 mg/dL (ref 0.50–1.35)
GFR calc Af Amer: 79 mL/min — ABNORMAL LOW (ref >90)
GFR, EST NON AFRICAN AMERICAN: 68 mL/min — AB (ref >90)
Glucose, Bld: 278 mg/dL — ABNORMAL HIGH (ref 70–99)
POTASSIUM: 4.9 meq/L (ref 3.7–5.3)
SODIUM: 140 meq/L (ref 137–147)
Total Protein: 7.2 g/dL (ref 6.0–8.3)

## 2014-01-03 LAB — POCT I-STAT 4, (NA,K, GLUC, HGB,HCT)
GLUCOSE: 125 mg/dL — AB (ref 70–99)
HEMATOCRIT: 28 % — AB (ref 39.0–52.0)
HEMOGLOBIN: 9.5 g/dL — AB (ref 13.0–17.0)
POTASSIUM: 3.4 meq/L — AB (ref 3.7–5.3)
SODIUM: 142 meq/L (ref 137–147)

## 2014-01-03 LAB — CBC
HCT: 35.2 % — ABNORMAL LOW (ref 39.0–52.0)
Hemoglobin: 11.3 g/dL — ABNORMAL LOW (ref 13.0–17.0)
MCH: 26.8 pg (ref 26.0–34.0)
MCHC: 32.1 g/dL (ref 30.0–36.0)
MCV: 83.4 fL (ref 78.0–100.0)
PLATELETS: 245 10*3/uL (ref 150–400)
RBC: 4.22 MIL/uL (ref 4.22–5.81)
RDW: 12.5 % (ref 11.5–15.5)
WBC: 7.3 10*3/uL (ref 4.0–10.5)

## 2014-01-03 LAB — TYPE AND SCREEN
ABO/RH(D): B POS
Antibody Screen: NEGATIVE

## 2014-01-03 LAB — HEPARIN LEVEL (UNFRACTIONATED): Heparin Unfractionated: <0.1 IU/mL — ABNORMAL LOW (ref 0.30–0.70)

## 2014-01-03 SURGERY — CREATION, BYPASS, ARTERIAL, FEMORAL TO FEMORAL, USING GRAFT
Anesthesia: General | Site: Leg Lower | Laterality: Left

## 2014-01-03 MED ORDER — ROCURONIUM BROMIDE 50 MG/5ML IV SOLN
INTRAVENOUS | Status: AC
Start: 2014-01-03 — End: 2014-01-03
  Filled 2014-01-03: qty 1

## 2014-01-03 MED ORDER — LIDOCAINE HCL (CARDIAC) 20 MG/ML IV SOLN
INTRAVENOUS | Status: DC | PRN
  Administered 2014-01-03: 60 mg via INTRAVENOUS

## 2014-01-03 MED ORDER — ONDANSETRON HCL 4 MG/2ML IJ SOLN
INTRAMUSCULAR | Status: AC
Start: 2014-01-03 — End: 2014-01-03
  Filled 2014-01-03: qty 2

## 2014-01-03 MED ORDER — DEXTROSE 5 % IV SOLN
1.5000 g | Freq: Two times a day (BID) | INTRAVENOUS | Status: AC
  Administered 2014-01-03 – 2014-01-04 (×2): 1.5 g via INTRAVENOUS
  Filled 2014-01-03 (×2): qty 1.5

## 2014-01-03 MED ORDER — DOCUSATE SODIUM 100 MG PO CAPS
100.0000 mg | ORAL_CAPSULE | Freq: Every day | ORAL | Status: DC
  Administered 2014-01-04: 100 mg via ORAL
  Filled 2014-01-03 (×2): qty 1

## 2014-01-03 MED ORDER — ENOXAPARIN SODIUM 30 MG/0.3ML ~~LOC~~ SOLN
30.0000 mg | SUBCUTANEOUS | Status: DC
  Filled 2014-01-03: qty 0.3

## 2014-01-03 MED ORDER — ONDANSETRON HCL 4 MG/2ML IJ SOLN
INTRAMUSCULAR | Status: AC
  Filled 2014-01-03: qty 2

## 2014-01-03 MED ORDER — THROMBIN 20000 UNITS EX SOLR
CUTANEOUS | Status: AC
  Filled 2014-01-03: qty 20000

## 2014-01-03 MED ORDER — ASPIRIN EC 81 MG PO TBEC
81.0000 mg | DELAYED_RELEASE_TABLET | Freq: Every day | ORAL | Status: DC
  Administered 2014-01-04 – 2014-01-07 (×4): 81 mg via ORAL
  Filled 2014-01-03 (×4): qty 1

## 2014-01-03 MED ORDER — 0.9 % SODIUM CHLORIDE (POUR BTL) OPTIME
TOPICAL | Status: DC | PRN
  Administered 2014-01-03: 2000 mL

## 2014-01-03 MED ORDER — OXYCODONE HCL 5 MG PO TABS
5.0000 mg | ORAL_TABLET | ORAL | Status: DC | PRN
  Administered 2014-01-04 (×3): 10 mg via ORAL
  Administered 2014-01-04 (×2): 5 mg via ORAL
  Administered 2014-01-05 (×2): 10 mg via ORAL
  Filled 2014-01-03: qty 2
  Filled 2014-01-03: qty 1
  Filled 2014-01-03: qty 2
  Filled 2014-01-03: qty 1
  Filled 2014-01-03 (×3): qty 2

## 2014-01-03 MED ORDER — MIDAZOLAM HCL 5 MG/5ML IJ SOLN
INTRAMUSCULAR | Status: DC | PRN
  Administered 2014-01-03: 1 mg via INTRAVENOUS

## 2014-01-03 MED ORDER — PROPOFOL 10 MG/ML IV BOLUS
INTRAVENOUS | Status: AC
  Filled 2014-01-03: qty 20

## 2014-01-03 MED ORDER — LACTATED RINGERS IV SOLN
INTRAVENOUS | Status: DC | PRN
  Administered 2014-01-03 (×3): via INTRAVENOUS

## 2014-01-03 MED ORDER — PHENOL 1.4 % MT LIQD: 1.0000 | OROMUCOSAL | Status: DC | PRN

## 2014-01-03 MED ORDER — PHENYLEPHRINE HCL 10 MG/ML IJ SOLN
INTRAMUSCULAR | Status: DC | PRN
  Administered 2014-01-03: 40 ug via INTRAVENOUS
  Administered 2014-01-03 (×2): 80 ug via INTRAVENOUS
  Administered 2014-01-03: 40 ug via INTRAVENOUS

## 2014-01-03 MED ORDER — PROTAMINE SULFATE 10 MG/ML IV SOLN
INTRAVENOUS | Status: DC | PRN
Start: 2014-01-03 — End: 2014-01-03
  Administered 2014-01-03: 10 mg via INTRAVENOUS

## 2014-01-03 MED ORDER — ONDANSETRON HCL 4 MG/2ML IJ SOLN
INTRAMUSCULAR | Status: DC | PRN
  Administered 2014-01-03: 4 mg via INTRAVENOUS

## 2014-01-03 MED ORDER — METOPROLOL TARTRATE 25 MG PO TABS
25.0000 mg | ORAL_TABLET | Freq: Two times a day (BID) | ORAL | Status: DC
Start: 2014-01-03 — End: 2014-01-07
  Administered 2014-01-04 – 2014-01-07 (×6): 25 mg via ORAL
  Filled 2014-01-03 (×10): qty 1

## 2014-01-03 MED ORDER — ACETAMINOPHEN 650 MG RE SUPP: 325.0000 mg | RECTAL | Status: DC | PRN

## 2014-01-03 MED ORDER — GUAIFENESIN-DM 100-10 MG/5ML PO SYRP: 15.0000 mL | ORAL_SOLUTION | ORAL | Status: DC | PRN

## 2014-01-03 MED ORDER — SODIUM CHLORIDE 0.9 % IR SOLN
Status: DC | PRN
  Administered 2014-01-03: 09:00:00

## 2014-01-03 MED ORDER — ARTIFICIAL TEARS OP OINT
TOPICAL_OINTMENT | OPHTHALMIC | Status: AC
  Filled 2014-01-03: qty 3.5

## 2014-01-03 MED ORDER — NEOSTIGMINE METHYLSULFATE 10 MG/10ML IV SOLN
INTRAVENOUS | Status: AC
  Filled 2014-01-03: qty 1

## 2014-01-03 MED ORDER — IOHEXOL 300 MG/ML  SOLN
INTRAMUSCULAR | Status: DC | PRN
  Administered 2014-01-03: 50 mL via INTRAVENOUS

## 2014-01-03 MED ORDER — LIDOCAINE HCL (CARDIAC) 20 MG/ML IV SOLN
INTRAVENOUS | Status: AC
  Filled 2014-01-03: qty 5

## 2014-01-03 MED ORDER — HEPARIN SODIUM (PORCINE) 1000 UNIT/ML IJ SOLN
INTRAMUSCULAR | Status: AC
  Filled 2014-01-03: qty 1

## 2014-01-03 MED ORDER — ONDANSETRON HCL 4 MG/2ML IJ SOLN
4.0000 mg | Freq: Four times a day (QID) | INTRAMUSCULAR | Status: DC | PRN
  Administered 2014-01-04: 4 mg via INTRAVENOUS
  Filled 2014-01-03: qty 2

## 2014-01-03 MED ORDER — ONDANSETRON HCL 4 MG/2ML IJ SOLN: 4.0000 mg | Freq: Once | INTRAMUSCULAR | Status: DC | PRN

## 2014-01-03 MED ORDER — CLONIDINE HCL 0.1 MG/24HR TD PTWK
0.1000 mg | MEDICATED_PATCH | TRANSDERMAL | Status: DC
  Administered 2014-01-03: 0.1 mg via TRANSDERMAL
  Filled 2014-01-03: qty 1

## 2014-01-03 MED ORDER — SODIUM CHLORIDE 0.9 % IV SOLN
INTRAVENOUS | Status: DC
  Filled 2014-01-03: qty 2.5

## 2014-01-03 MED ORDER — FENTANYL CITRATE 0.05 MG/ML IJ SOLN
INTRAMUSCULAR | Status: AC
  Filled 2014-01-03: qty 5

## 2014-01-03 MED ORDER — FENTANYL CITRATE 0.05 MG/ML IJ SOLN
25.0000 ug | INTRAMUSCULAR | Status: DC | PRN
  Administered 2014-01-03 (×3): 25 ug via INTRAVENOUS
  Administered 2014-01-03: 50 ug via INTRAVENOUS
  Administered 2014-01-03: 25 ug via INTRAVENOUS

## 2014-01-03 MED ORDER — MIDAZOLAM HCL 2 MG/2ML IJ SOLN
INTRAMUSCULAR | Status: AC
  Filled 2014-01-03: qty 2

## 2014-01-03 MED ORDER — ACETAMINOPHEN 325 MG PO TABS
325.0000 mg | ORAL_TABLET | ORAL | Status: DC | PRN
  Administered 2014-01-04 – 2014-01-06 (×3): 650 mg via ORAL
  Filled 2014-01-03 (×3): qty 2

## 2014-01-03 MED ORDER — MORPHINE SULFATE 2 MG/ML IJ SOLN
2.0000 mg | INTRAMUSCULAR | Status: DC | PRN
  Administered 2014-01-03: 2 mg via INTRAVENOUS
  Filled 2014-01-03: qty 1

## 2014-01-03 MED ORDER — PHENYLEPHRINE HCL 10 MG/ML IJ SOLN
10.0000 mg | INTRAVENOUS | Status: DC | PRN
  Administered 2014-01-03: 40 ug/min via INTRAVENOUS

## 2014-01-03 MED ORDER — ROCURONIUM BROMIDE 100 MG/10ML IV SOLN
INTRAVENOUS | Status: DC | PRN
  Administered 2014-01-03: 30 mg via INTRAVENOUS

## 2014-01-03 MED ORDER — THROMBIN 20000 UNITS EX SOLR
CUTANEOUS | Status: DC | PRN
  Administered 2014-01-03: 12:00:00 via TOPICAL

## 2014-01-03 MED ORDER — NEOSTIGMINE METHYLSULFATE 10 MG/10ML IV SOLN
INTRAVENOUS | Status: DC | PRN
  Administered 2014-01-03: 4 mg via INTRAVENOUS

## 2014-01-03 MED ORDER — LACTATED RINGERS IV SOLN
INTRAVENOUS | Status: DC | PRN
  Administered 2014-01-03: 08:00:00 via INTRAVENOUS

## 2014-01-03 MED ORDER — PROPOFOL 10 MG/ML IV BOLUS
INTRAVENOUS | Status: DC | PRN
  Administered 2014-01-03: 50 mg via INTRAVENOUS

## 2014-01-03 MED ORDER — POTASSIUM CHLORIDE CRYS ER 20 MEQ PO TBCR: 20.0000 meq | EXTENDED_RELEASE_TABLET | Freq: Every day | ORAL | Status: DC | PRN

## 2014-01-03 MED ORDER — SODIUM CHLORIDE 0.9 % IV SOLN
250.0000 [IU] | INTRAVENOUS | Status: DC | PRN
  Administered 2014-01-03: 5.2 [IU]/h via INTRAVENOUS

## 2014-01-03 MED ORDER — PANTOPRAZOLE SODIUM 40 MG PO TBEC
40.0000 mg | DELAYED_RELEASE_TABLET | Freq: Every day | ORAL | Status: DC
  Administered 2014-01-04 – 2014-01-07 (×4): 40 mg via ORAL
  Filled 2014-01-03 (×3): qty 1

## 2014-01-03 MED ORDER — HYDROCHLOROTHIAZIDE 25 MG PO TABS
25.0000 mg | ORAL_TABLET | Freq: Every day | ORAL | Status: DC
  Administered 2014-01-04: 25 mg via ORAL
  Filled 2014-01-03 (×3): qty 1

## 2014-01-03 MED ORDER — GLYCOPYRROLATE 0.2 MG/ML IJ SOLN
INTRAMUSCULAR | Status: DC | PRN
  Administered 2014-01-03: 0.6 mg via INTRAVENOUS

## 2014-01-03 MED ORDER — METOPROLOL TARTRATE 1 MG/ML IV SOLN: 2.0000 mg | INTRAVENOUS | Status: DC | PRN

## 2014-01-03 MED ORDER — FENTANYL CITRATE 0.05 MG/ML IJ SOLN
INTRAMUSCULAR | Status: DC | PRN
  Administered 2014-01-03 (×2): 25 ug via INTRAVENOUS
  Administered 2014-01-03: 50 ug via INTRAVENOUS

## 2014-01-03 MED ORDER — HYDRALAZINE HCL 20 MG/ML IJ SOLN: 5.0000 mg | INTRAMUSCULAR | Status: DC | PRN

## 2014-01-03 MED ORDER — ALUM & MAG HYDROXIDE-SIMETH 200-200-20 MG/5ML PO SUSP: 15.0000 mL | ORAL | Status: DC | PRN

## 2014-01-03 MED ORDER — SODIUM CHLORIDE 0.9 % IV SOLN: 500.0000 mL | Freq: Once | INTRAVENOUS | Status: AC | PRN

## 2014-01-03 MED ORDER — FENTANYL CITRATE 0.05 MG/ML IJ SOLN
INTRAMUSCULAR | Status: AC
  Filled 2014-01-03: qty 2

## 2014-01-03 MED ORDER — HEPARIN SODIUM (PORCINE) 1000 UNIT/ML IJ SOLN
INTRAMUSCULAR | Status: DC | PRN
  Administered 2014-01-03: 2000 [IU] via INTRAVENOUS
  Administered 2014-01-03: 8000 [IU] via INTRAVENOUS

## 2014-01-03 MED ORDER — LABETALOL HCL 5 MG/ML IV SOLN
10.0000 mg | INTRAVENOUS | Status: DC | PRN
  Filled 2014-01-03: qty 4

## 2014-01-03 MED ORDER — GLYCOPYRROLATE 0.2 MG/ML IJ SOLN
INTRAMUSCULAR | Status: AC
  Filled 2014-01-03: qty 3

## 2014-01-03 MED ORDER — PHENYLEPHRINE 40 MCG/ML (10ML) SYRINGE FOR IV PUSH (FOR BLOOD PRESSURE SUPPORT)
PREFILLED_SYRINGE | INTRAVENOUS | Status: AC
  Filled 2014-01-03: qty 10

## 2014-01-03 MED ORDER — PROTAMINE SULFATE 10 MG/ML IV SOLN
INTRAVENOUS | Status: AC
  Filled 2014-01-03: qty 5

## 2014-01-03 SURGICAL SUPPLY — 60 items
BLADE SURG 15 STRL LF DISP TIS (BLADE) ×2 IMPLANT
BLADE SURG 15 STRL SS (BLADE) ×7
CANISTER SUCTION 2500CC (MISCELLANEOUS) ×7 IMPLANT
CANNULA VESSEL 3MM 2 BLNT TIP (CANNULA) ×7 IMPLANT
CATH EMB 4FR 80CM (CATHETERS) ×8 IMPLANT
CATH EMB 5FR 80CM (CATHETERS) ×4 IMPLANT
CLIP TI MEDIUM 24 (CLIP) ×7 IMPLANT
CLIP TI WIDE RED SMALL 24 (CLIP) ×7 IMPLANT
COVER SURGICAL LIGHT HANDLE (MISCELLANEOUS) ×7 IMPLANT
DRAIN CHANNEL 15F RND FF W/TCR (WOUND CARE) IMPLANT
DRAIN SNY 10X20 3/4 PERF (WOUND CARE) IMPLANT
DRAPE X-RAY CASS 24X20 (DRAPES) ×4 IMPLANT
ELECT REM PT RETURN 9FT ADLT (ELECTROSURGICAL) ×7
ELECTRODE REM PT RTRN 9FT ADLT (ELECTROSURGICAL) ×5 IMPLANT
EVACUATOR SILICONE 100CC (DRAIN) IMPLANT
GAUZE SPONGE 4X4 16PLY XRAY LF (GAUZE/BANDAGES/DRESSINGS) ×4 IMPLANT
GLOVE BIO SURGEON STRL SZ 6.5 (GLOVE) ×9 IMPLANT
GLOVE BIO SURGEON STRL SZ7.5 (GLOVE) ×11 IMPLANT
GLOVE BIO SURGEONS STRL SZ 6.5 (GLOVE) ×3
GLOVE BIOGEL PI IND STRL 6.5 (GLOVE) ×10 IMPLANT
GLOVE BIOGEL PI IND STRL 7.0 (GLOVE) ×6 IMPLANT
GLOVE BIOGEL PI IND STRL 8 (GLOVE) ×9 IMPLANT
GLOVE BIOGEL PI INDICATOR 6.5 (GLOVE) ×10
GLOVE BIOGEL PI INDICATOR 7.0 (GLOVE) ×6
GLOVE BIOGEL PI INDICATOR 8 (GLOVE) ×6
GLOVE ECLIPSE 6.5 STRL STRAW (GLOVE) ×16 IMPLANT
GOWN STRL REUS W/ TWL LRG LVL3 (GOWN DISPOSABLE) ×25 IMPLANT
GOWN STRL REUS W/TWL LRG LVL3 (GOWN DISPOSABLE) ×56
GRAFT HEMASHIELD 8MM (Vascular Products) ×7 IMPLANT
GRAFT VASC STRG 30X8KNIT (Vascular Products) ×2 IMPLANT
INSERT FOGARTY SM (MISCELLANEOUS) ×11 IMPLANT
KIT BASIN OR (CUSTOM PROCEDURE TRAY) ×7 IMPLANT
KIT ROOM TURNOVER OR (KITS) ×7 IMPLANT
LIQUID BAND (GAUZE/BANDAGES/DRESSINGS) ×8 IMPLANT
NS IRRIG 1000ML POUR BTL (IV SOLUTION) ×14 IMPLANT
PACK PERIPHERAL VASCULAR (CUSTOM PROCEDURE TRAY) ×7 IMPLANT
PAD ARMBOARD 7.5X6 YLW CONV (MISCELLANEOUS) ×14 IMPLANT
PROBE PENCIL 8 MHZ STRL DISP (MISCELLANEOUS) ×4 IMPLANT
SET COLLECT BLD 21X3/4 12 PB (MISCELLANEOUS) ×4 IMPLANT
SPONGE LAP 18X18 X RAY DECT (DISPOSABLE) ×4 IMPLANT
SPONGE SURGIFOAM ABS GEL 100 (HEMOSTASIS) ×4 IMPLANT
STAPLER VISISTAT (STAPLE) IMPLANT
STOPCOCK 4 WAY LG BORE MALE ST (IV SETS) ×8 IMPLANT
SUT PROLENE 5 0 C 1 24 (SUTURE) ×30 IMPLANT
SUT PROLENE 6 0 BV (SUTURE) ×15 IMPLANT
SUT SILK 2 0 FS (SUTURE) ×3 IMPLANT
SUT SILK 2 0 SH (SUTURE) ×7 IMPLANT
SUT SILK 3 0 (SUTURE)
SUT SILK 3-0 18XBRD TIE 12 (SUTURE) IMPLANT
SUT VIC AB 2-0 CTB1 (SUTURE) ×22 IMPLANT
SUT VIC AB 3-0 SH 27 (SUTURE) ×14
SUT VIC AB 3-0 SH 27X BRD (SUTURE) ×10 IMPLANT
SUT VICRYL 4-0 PS2 18IN ABS (SUTURE) ×18 IMPLANT
SYR 3ML LL SCALE MARK (SYRINGE) ×12 IMPLANT
TOWEL OR 17X24 6PK STRL BLUE (TOWEL DISPOSABLE) ×10 IMPLANT
TOWEL OR 17X26 10 PK STRL BLUE (TOWEL DISPOSABLE) ×6 IMPLANT
TRAY FOLEY CATH 16FRSI W/METER (SET/KITS/TRAYS/PACK) ×7 IMPLANT
TUBING EXTENTION W/L.L. (IV SETS) ×4 IMPLANT
UNDERPAD 30X30 INCONTINENT (UNDERPADS AND DIAPERS) ×7 IMPLANT
WATER STERILE IRR 1000ML POUR (IV SOLUTION) ×7 IMPLANT

## 2014-01-03 NOTE — Op Note (Signed)
NAME: [name]   MRN: [id number] DOB: 06/16/1950    DATE OF OPERATION: 01/03/2014  PREOP DIAGNOSIS:  1. 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 Diffuse infrainguinal arterial occlusive disease  POSTOP DIAGNOSIS: same  PROCEDURE:  1. Thrombectomy of left limb of aortofemoral bypass graft 2. Extensive endarterectomy of the left common femoral artery and deep femoral artery 3. Bypass from the left limb of the aortofemoral bypass graft to the deep femoral artery using 8 mm Dacron graft 4. Intraoperative arteriogram of the left lower extremity 5. Thrombectomy of the right limb of the aortofemoral bypass graft 6. Endarterectomy of the right common femoral artery 7. Dacron patch angioplasty of distal right limb of aortofemoral bypass graft  SURGEON: Di Kindlehristopher S. Edilia Boickson, MD, FACS  ASSIST: Doreatha MassedSamantha Rhyne, PA, Karsten RoKim Trinh, [facility]  ANESTHESIA: Gen.   EBL: per anesthesia record  INDICATIONS: [name] is a 63 y.o. male who had undergone an aortobifemoral bypass graft by Dr. Liliane BadeGreg Hayes in 2007. He has also had bilateral lower extremity bypass grafts which are chronically occluded. He presented with the sudden onset of paresthesias in the left lower extremity. By exam I felt that he had a occluded left limb of his aortofemoral bypass graft. In addition he describes some symptoms on the right side and I recommended arteriography to further evaluate his graft. His arteriogram showed that the left limb of the graft was occluded. There was extensive plaque also in the common femoral artery which I felt would put the right limb of the graft at risk of occluding also. He presents for attempted revascularization of the left lower extremity and also with plan to address the common femoral artery stenosis on the right.  FINDINGS: The patient had diffuse severe calcific disease in the common femoral arteries and deep femoral arteries bilaterally. The superficial femoral arteries were  chronically occluded. Arteriogram on the left showed no distal targets for bypass.   TECHNIQUE: The patient was taken to the operative room and received a general anesthetic. He was a difficult access for IVs and therefore anesthesia placed a central line. Both groins and entire left lower extremity were prepped and draped in usual sterile fashion.  On the left side, a longitudinal incision was made at the site of the previous incision. This was carried down to the left limb of the graft. The dissection was very difficult as there was extensive scar tissue and the anastomosis was high on the common femoral artery at the level of the inguinal ligament. However I was carefully able to dissect out the anterior aspect of the left limb of the graft, the superficial femoral artery and the deep femoral artery. Left superficial femoral artery was diffusely diseased. There was also diffuse calcific disease in the deep femoral artery. I controlled secondary branches to allow better exposure of the deep femoral artery.  Before heparinizing, I elected to expose the right groin. The right groin was opened through a longitudinal incision and again, through dense scar tissue I carefully dissected out the right limb of the graft enough that I couldn't get a clamp across this as this was still patent. I also controlled the superficial femoral artery and the deep femoral artery.  Attention was next turned to the left groin after the heparin had circulated. A left limb of the graft was occluded. The superficial femoral artery was occluded. The deep femoral artery was controlled with a clamp. Wrap was divided transversely at the level of the heel  of the anastomosis. I was then able to dissected graft off of the native artery. I then opened the previous anastomosis and there was extensive plaque with very little lumen present. This is most likely the etiology of the left limb of the graft occluding, that is poor outflow. There  was no area to sew at this level and therefore he required an extensive endarterectomy. The superficial femoral artery was chronically occluded. I extended the arteriotomy onto the deep femoral artery and was able to find an area to sew to after endarterectomy. An 8 mm graft was divided transversely and sewn end to end to the left limb of the aortofemoral graft with 5-0 Prolene suture. Through this new segment of graft and then performed a thrombectomy of the left limb of the graft using a #5 Fogarty catheter. I was able to retrieve all of the clot and the laminated thrombus. There was excellent inflow through the left limb of the graft. This graft was then cut to the appropriate length, and spatulated and sewn end to end to the deep femoral artery using continuous 5-0 Prolene suture. Of note the proximal common femoral artery was chronically occluded and there was no backbleeding here. At the completion was not happy with the flow through the deep femoral artery and the vessel was controlled again distally. A small transverse arteriotomy was made in the deep femoral artery and some further plaque was retrieved although there was really no endpoint area this was then closed with 5-0 Prolene suture. At the completion there was diastolic flow in the deep femoral artery.  Of note there was no Doppler flow in the foot at this point and I shot an intraoperative arteriogram. No distal tibial vessels weren't visualized that could be bypassed to. These had a chronically occluded infrarenal bypass on the left.  Attention was then turned to the right groin. Given the extensive plaque in the left groin with no endpoint I was hesitant to perform extensive endarterectomy on the right. I was able to clamp the right limb of the aortofemoral bypass graft. The deep femoral artery is controlled with a clamp. A longitudinal arteriotomy was made in the distal aspect of the right limb of the aortofemoral bypass graft. It was  extensive coronary plaque in the common femoral artery and also the proximal deep femoral artery. I was able to endarterectomize the coral reef component without having to perform an extensive endarterectomy as again I suspected there was no good endpoint. There was some laminated clot in the right limb of the graft and therefore I performed thrombectomy of the right limb of the graft. I then took a segment of the Dacron graft to create a patch. The patch was sewn over the graftotomy using 5-0 Prolene suture. The arteries were backbled and flushed and the anastomosis completed and flow reestablished to the right leg. There was next in similar deep femoral artery at the completion. The superficial femoral artery on the right was chronically occluded and his previous infrarenal bypass which had been done elsewhere was chronically occluded.  Hemostasis was obtained in the wounds. We did attempt to get some protamine have her his pressure dropped and therefore he only received 10 mg of protamine. Each of the wounds was closed deeply of 2-0 Vicryl, the skin closed with 3-0 Vicryl, and the skin closed with 40 subcutaneous stitch. Dermabond was applied. The patient tolerated the procedure well and was transferred to the recovery room in stable condition. All needle and sponge  counts were correct.  CLINICAL NOTE: Given the diffuse disease of the deep femoral artery and no distal targets on the right I do not see any further options for revascularization on the left. The left limb of the graft occluded because there is no outflow. He is at high-risk for occlusion of the left limb of the graft again. I explained to the family that if there is no improvement in the left leg and he would require an above-the-knee amputation on the left.  Waverly Ferrari, MD, FACS [facility]  DATE OF DICTATION:   01/03/2014

## 2014-01-03 NOTE — OR Nursing (Signed)
Right femoral artery line removed by Dr. Edilia Boickson in OR 11. Dr. Edilia Boickson and Tilden FossaLanita Presson, RN held pressure after removal of line. Prepping for surgical procedure proceeded after pressure held.

## 2014-01-03 NOTE — Anesthesia Procedure Notes (Addendum)
Procedure Name: Intubation Date/Time: 01/03/2014 8:03 AM Performed by: Darcey NoraJAMES, KAREN B Pre-anesthesia Checklist: Patient identified, Emergency Drugs available, Suction available and Patient being monitored Patient Re-evaluated:Patient Re-evaluated prior to inductionOxygen Delivery Method: Circle system utilized Preoxygenation: Pre-oxygenation with 100% oxygen Intubation Type: IV induction Ventilation: Oral airway inserted - appropriate to patient size Laryngoscope Size: Mac and 4 Grade View: Grade I Tube type: Oral Tube size: 7.5 mm Number of attempts: 2 (paramedic student unable to lift epiglottis) Airway Equipment and Method: Stylet Placement Confirmation: ETT inserted through vocal cords under direct vision,  breath sounds checked- equal and bilateral and positive ETCO2 Secured at: 21 (cm at gum) cm Tube secured with: Tape Dental Injury: Teeth and Oropharynx as per pre-operative assessment

## 2014-01-03 NOTE — Progress Notes (Signed)
[facility] History and Physical  Patient: [name]  FAO:[id number]  DOB: 03/07/1950  DOS: the patient was seen and examined [name] 01/03/2014 PCP: [name], [name], MD  Chief Complaint: left leg pain.  HPI: Collin LeitzWilliam F [name] is a 63 y.o. male with Past medical history of PVD, CAD, DM-2, HTN, ANEMIA, CKD, OSA, BPH, pacemaker implant. The patient presented with complaints of left leg pain. He mentions that he woke up with the symptoms in the morning the pain was started having this high and was radiating to his leg. While the day his pain was progressively worsening and therefore he decided [facility]. In any event he was found to have abnormal ABI and vascular surgery was consulted. Vascular surgery has followed up with the patient and recommend further workup and has requested the patient to be admitted to medicine service. At the time of my evaluation patient mentions the pain in the left leg has.     Subjective No complaints  Assessment and plan  1. Lower limb ischemia The patient is presenting with complaints of left leg pain. Being managed by vascular surgery aortogram and runoff 11/19 via a right femoral approach.    on 11/20 patient had 1.Thrombectomy of left limb of aortofemoral bypass graft 2. Extensive endarterectomy of the left common femoral artery and deep femoral artery 3. Bypass from the left limb of the aortofemoral bypass graft to the deep femoral artery using 8 mm Dacron graft 4. Intraoperative arteriogram of the left lower extremity 5. Thrombectomy of the right limb of the aortofemoral bypass graft 6. Endarterectomy of the right common femoral artery 7. Dacron patch angioplasty of distal right limb of aortofemoral bypass graft Right femoral artery line removed by Dr. Edilia Boickson in OR  ,   Continue IV heparin as per vascular surgery recommendations    2.diabetes mellitus.  uncontrolled Hemoglobin A1c  11.1 Continue Levemir and  SSI  3.bilateral paresthesia. Patient is found to have bilateral sounds and numbness of the leg. As well as he has some mild numbness of his fingers. CT of the head is negative for any acute abnormality as well as x-rays cervical spine as well as x-ray lumbar spine. Continue gabapentin   4.coronary artery disease. Pacemaker implant. Continue with aspirin  , telemetry. Patient did have some chest pain 2 weeks ago but denies any chest pain on and off.   5. Accelerated hypertension. Blood pressure mildly elevated., DC ACE inhibitor, HCTZ in the setting of contrast study Started on prn hydralazine  Restart hydrochlorothiazide  today Also start the patient on a clonidine patch  6. Depression Psych Social work consulted Patient will need outpatient psych evaluation Currently denies suicidal homicidal ideation   Advance goals of care discussion: full code as per my discussion with patient   Consults: vascular surgery  DVT Prophylaxis: on chronic anticoagulation Nutrition: nothing by mouth after midnight  Family Communication: family was present at bedside, opportunity was given to ask question and all questions were answered satisfactorily at the time of interview. Disposition: Admitted to inpatient in telemetry unit.       Prior to Admission medications   Medication Sig Start Date End Date Taking? Authorizing Provider  acetaminophen (TYLENOL) 500 MG tablet Take 2 tablets (1,000 mg total) by mouth every 6 (six) hours as needed. Pain. 05/08/13  Yes Salley ScarletKawanta F [place], MD  albuterol (PROVENTIL HFA;VENTOLIN HFA) 108 (90 BASE) MCG/ACT inhaler Inhale 2 puffs into the lungs every 4 (four) hours  as needed. 06/21/13  Yes Salley ScarletKawanta F [place], MD  aspirin 81 MG tablet Take 1 tablet (81 mg total) by mouth daily. 05/08/13  Yes Salley ScarletKawanta F [place], MD  BAYER CONTOUR NEXT TEST test strip TEST THREE TIMES A DAY 08/07/13  Yes Salley ScarletKawanta F [place], MD  Insulin Detemir (LEVEMIR FLEXTOUCH) 100 UNIT/ML Pen Inject  45 Units into the skin daily at 10 pm. 10/24/13  Yes Salley ScarletKawanta F [place], MD  insulin lispro (HUMALOG) 100 UNIT/ML KiwkPen Inject 12 Units into the skin 3 (three) times daily with meals. 06/24/13  Yes Salley ScarletKawanta F [place], MD  lisinopril-hydrochlorothiazide (PRINZIDE,ZESTORETIC) 20-12.5 MG per tablet Take 2 tablets by mouth daily. 06/21/13  Yes Salley ScarletKawanta F [place], MD  metFORMIN (GLUCOPHAGE) 1000 MG tablet Take 1 tablet (1,000 mg total) by mouth 2 (two) times daily with a meal. 06/21/13  Yes Salley ScarletKawanta F [place], MD  metoprolol tartrate (LOPRESSOR) 25 MG tablet Take 1 tablet (25 mg total) by mouth 2 (two) times daily. 06/21/13  Yes Salley ScarletKawanta F [place], MD  simvastatin (ZOCOR) 10 MG tablet Take 1 tablet (10 mg total) by mouth at bedtime. 06/21/13  Yes Salley ScarletKawanta F [place], MD  triamcinolone cream (KENALOG) 0.1 % Apply 1 application topically 2 (two) times daily. Apply to leg 08/28/13  Yes Salley ScarletKawanta F [employer], MD  gabapentin (NEURONTIN) 300 MG capsule Take 1 capsule (300 mg total) by mouth 3 (three) times daily. Patient not taking: Reported on 12/31/2013 06/21/13   Salley ScarletKawanta F , MD    Physical Exam: Filed Vitals:   01/03/14 0400 01/03/14 0415 01/03/14 0500 01/03/14 0600  BP: 170/76  114/64 139/51  Pulse: 75 75 70 74  Temp: 98.1 F (36.7 C)     TempSrc:      Resp: 11 11 15 14   Height:      Weight:      SpO2: 97% 97% 98% 99%    General: Alert, Awake and Oriented to Time, Place and Person. Appear in mild distress Eyes: PERRL ENT: Oral Mucosa clear moist. Neck: no JVD Cardiovascular: S1 and S2 Present, no Murmur, Peripheral Pulses Present Respiratory: Bilateral Air entry equal and Decreased, Clear to Auscultation, noCrackles, no wheezes Abdomen: Bowel Sound present, Soft and non tender Skin: no Rash Extremities: trace Pedal edema, no calf tenderness Neurologic: Grossly no focal neuro deficit. Bilateral lower limb numbness  Labs on Admission:  CBC:  Recent Labs Lab 12/31/13 1339 01/01/14 0158 01/02/14 0439  01/03/14 0430 01/03/14 1153  WBC 7.6 6.6 6.4 7.3  --   NEUTROABS 5.7  --   --   --   --   HGB 12.7* 11.8* 11.4* 11.3* 9.5*  HCT 38.7* 36.0* 35.7* 35.2* 28.0*  MCV 83.2 82.2 83.0 83.4  --   PLT 207 211 212 245  --     CMP     Component Value Date/Time   NA 142 01/03/2014 1153   K 3.4* 01/03/2014 1153   CL 101 01/03/2014 0430   CO2 25 01/03/2014 0430   GLUCOSE 125* 01/03/2014 1153   BUN 16 01/03/2014 0430   CREATININE 1.11 01/03/2014 0430   CREATININE 1.26 12/03/2013 0848   CALCIUM 9.9 01/03/2014 0430   PROT 7.2 01/03/2014 0430   ALBUMIN 3.6 01/03/2014 0430   AST 21 01/03/2014 0430   ALT 16 01/03/2014 0430   ALKPHOS 51 01/03/2014 0430   BILITOT 0.5 01/03/2014 0430   GFRNONAA 68* 01/03/2014 0430   GFRNONAA 59* 08/28/2013 1206   GFRAA 79* 01/03/2014 0430   GFRAA 68 08/28/2013  1206    No results for input(s): LIPASE, AMYLASE in the last 168 hours. No results for input(s): AMMONIA in the last 168 hours.   Recent Labs Lab 12/31/13 2051  CKTOTAL 214  TROPONINI <0.30   BNP (last 3 results) No results for input(s): PROBNP in the last 8760 hours.  Radiological Exams on Admission: No results found.  Assessment/Plan Principal Problem:   Lower limb ischemia Active Problems:   Diabetic neuropathy   Essential hypertension   Arteriosclerotic cardiovascular disease (ASCVD)   Cerebrovascular disease   Peripheral vascular disease   Sick sinus syndrome   Obstructive sleep apnea      [facility] Pager: [phone]  01/03/2014, 1:42 PM    If 7PM-7AM, please contact night-coverage [email or web address] Password [email or web address]

## 2014-01-03 NOTE — Progress Notes (Addendum)
  [facility] Progress Note  01/03/2014 4:43 PM Day of Surgery  Subjective: Patient seen in PACU.  Complaining only of cold feet.   Tmax 99.1 BP sys 110s-150s HR 70s  02 99% 2L  Filed Vitals:   01/03/14 1600  BP:   Pulse: 69  Temp:   Resp: 17    Physical Exam: General: resting in bed in NAD Incisions:  Bilateral groin incisions clean and intact. Soft without hematoma.  Extremities:  Right AT doppler signal. Mixed venous/arterial signal left AT.   CBC    Component Value Date/Time   WBC 7.3 01/03/2014 0430   RBC 4.22 01/03/2014 0430   HGB 9.5* 01/03/2014 1153   HCT 28.0* 01/03/2014 1153   PLT 245 01/03/2014 0430   MCV 83.4 01/03/2014 0430   MCH 26.8 01/03/2014 0430   MCHC 32.1 01/03/2014 0430   RDW 12.5 01/03/2014 0430   LYMPHSABS 1.3 12/31/2013 1339   MONOABS 0.4 12/31/2013 1339   EOSABS 0.1 12/31/2013 1339   BASOSABS 0.0 12/31/2013 1339    BMET    Component Value Date/Time   NA 142 01/03/2014 1153   K 3.4* 01/03/2014 1153   CL 101 01/03/2014 0430   CO2 25 01/03/2014 0430   GLUCOSE 125* 01/03/2014 1153   BUN 16 01/03/2014 0430   CREATININE 1.11 01/03/2014 0430   CREATININE 1.26 12/03/2013 0848   CALCIUM 9.9 01/03/2014 0430   GFRNONAA 68* 01/03/2014 0430   GFRNONAA 59* 08/28/2013 1206   GFRAA 79* 01/03/2014 0430   GFRAA 68 08/28/2013 1206    INR    Component Value Date/Time   INR 1.02 01/03/2014 0430     Intake/Output Summary (Last 24 hours) at 01/03/14 1643 Last data filed at 01/03/14 1408  Gross per 24 hour  Intake   2700 ml  Output   1830 ml  Net    870 ml     Assessment:  63 y.o. male is s/p:  1. Thrombectomy of left limb of aortofemoral bypass graft 2. Extensive endarterectomy of the left common femoral artery and deep femoral artery 3. Bypass from the left limb of the aortofemoral bypass graft to the deep femoral artery using 8 mm Dacron graft 4. Intraoperative arteriogram of the left lower extremity 5.  Thrombectomy of the right limb of the aortofemoral bypass graft 6. Endarterectomy of the right common femoral artery 7. Dacron patch angioplasty of distal right limb of aortofemoral bypass graft  Day of Surgery  Plan: -Groin incisions soft without hematoma. Right AT doppler signal. Mixed arterial/venous signal left AT.  -BP stable.  -Will be moved to 3S.    Maris BergerKimberly Shawnee Higham, PA-C [facility] Office: [phone] Pager: [phone] 01/03/2014 4:43 PM

## 2014-01-03 NOTE — Anesthesia Preprocedure Evaluation (Addendum)
Anesthesia Evaluation  Patient identified by MRN, date of birth, ID band Patient awake    Reviewed: Allergy & Precautions, H&P , NPO status , Patient's Chart, lab work & pertinent test results  Airway Mallampati: III  TM Distance: >3 FB Neck ROM: Full    Dental  (+) Edentulous Upper, Edentulous Lower   Pulmonary sleep apnea , COPDformer smoker,          Cardiovascular hypertension, Pt. on medications + CAD, + CABG and + Peripheral Vascular Disease + pacemaker     Neuro/Psych negative neurological ROS  negative psych ROS   GI/Hepatic Neg liver ROS, GERD-  Controlled and Medicated,  Endo/Other  diabetes, Poorly Controlled, Type 2, Insulin Dependent  Renal/GU negative Renal ROS     Musculoskeletal  (+) Arthritis -,   Abdominal   Peds  Hematology  (+) anemia ,   Anesthesia Other Findings Original assessment done at 0700  Reproductive/Obstetrics                          Anesthesia Physical Anesthesia Plan  ASA: III  Anesthesia Plan: General   Post-op Pain Management:    Induction: Intravenous  Airway Management Planned: Oral ETT  Additional Equipment: Arterial line, CVP and Ultrasound Guidance Line Placement  Intra-op Plan:   Post-operative Plan: Extubation in OR  Informed Consent: I have reviewed the patients History and Physical, chart, labs and discussed the procedure including the risks, benefits and alternatives for the proposed anesthesia with the patient or authorized representative who has indicated his/her understanding and acceptance.   Dental advisory given  Plan Discussed with: CRNA and Surgeon  Anesthesia Plan Comments:         Anesthesia Quick Evaluation

## 2014-01-03 NOTE — H&P (View-Only) (Signed)
VASCULAR SURGERY:  I have reviewed his arteriogram. The left limb of his aortobifemoral bypass graft is occluded. It is difficult to say if this is chronic or not. He has bilateral superficial femoral artery occlusions. He also has severe disease of his common femoral artery on the right. I suspect he had disease on the left which caused the left limb to occlude. The best option will likely be a right to left femorofemoral bypass graft. He could potentially require bilateral common femoral artery endarterectomies with vein patch if this is technically feasible.  Christopher Dickson, MD, FACS Beeper [phone] 01/02/2014  

## 2014-01-03 NOTE — Anesthesia Postprocedure Evaluation (Signed)
  Anesthesia Post-op Note  Patient: [name]  Procedure(s) Performed: Procedure(s): BYPASS GRAFT RIGHT FEMORAL-LEFT FEMORAL ARTERY  (Bilateral) ENDARTERECTOMY FEMORAL (Left) INTRA OPERATIVE ARTERIOGRAM, LEFT LOWER LEG (Left)  Patient Location: PACU  Anesthesia Type:General  Level of Consciousness: awake and alert   Airway and Oxygen Therapy: Patient Spontanous Breathing  Post-op Pain: none  Post-op Assessment: Post-op Vital signs reviewed  Post-op Vital Signs: Reviewed  Last Vitals:  Filed Vitals:   01/03/14 1700  BP:   Pulse: 71  Temp:   Resp: 17    Complications: No apparent anesthesia complications

## 2014-01-03 NOTE — Interval H&P Note (Signed)
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 After consideration of risks, benefits and other options for treatment, the patient has consented to  Procedure(s): THROMBECTOMY OF LEFT LIMB OF AORTOFEMORAL BYPASS GRAFT (Left) BYPASS GRAFT RIGHT FEMORAL-LEFT FEMORAL ARTERY  (Bilateral) BYPASS GRAFT AXILLOBIFEMORAL (N/A) as a surgical intervention .  The patient's history has been reviewed, patient examined, no change in status, stable for surgery.  I have reviewed the patient's chart and labs.  Questions were answered to the patient's satisfaction.     DICKSON,CHRISTOPHER S

## 2014-01-03 NOTE — Transfer of Care (Signed)
Immediate Anesthesia Transfer of Care Note  Patient: Bonney LeitzWilliam F Mia  Procedure(s) Performed: Procedure(s): BYPASS GRAFT RIGHT FEMORAL-LEFT FEMORAL ARTERY  (Bilateral) ENDARTERECTOMY FEMORAL (Left) INTRA OPERATIVE ARTERIOGRAM, LEFT LOWER LEG (Left)  Patient Location: PACU  Anesthesia Type:General  Level of Consciousness: awake, alert  and patient cooperative  Airway & Oxygen Therapy: Patient Spontanous Breathing and Patient connected to nasal cannula oxygen  Post-op Assessment: Report given to PACU RN and Post -op Vital signs reviewed and stable  Post vital signs: Reviewed and stable  Complications: No apparent anesthesia complications

## 2014-01-03 NOTE — Progress Notes (Signed)
Patient is in intractable left leg pain  not amenable to 4mg  of morphine and percocet. He is diaphoretic requiring frequent linen changes. Md called to discuss other options. Will continue to monitor distal peripheral pulses.

## 2014-01-04 LAB — CBC
HCT: 28.8 % — ABNORMAL LOW (ref 39.0–52.0)
HCT: 27 % — ABNORMAL LOW (ref 39.0–52.0)
HEMOGLOBIN: 9.3 g/dL — AB (ref 13.0–17.0)
HEMOGLOBIN: 8.7 g/dL — AB (ref 13.0–17.0)
MCH: 27.8 pg (ref 26.0–34.0)
MCH: 27.5 pg (ref 26.0–34.0)
MCHC: 32.3 g/dL (ref 30.0–36.0)
MCHC: 32.2 g/dL (ref 30.0–36.0)
MCV: 86 fL (ref 78.0–100.0)
MCV: 85.4 fL (ref 78.0–100.0)
PLATELETS: 182 10*3/uL (ref 150–400)
Platelets: 188 10*3/uL (ref 150–400)
RBC: 3.35 MIL/uL — AB (ref 4.22–5.81)
RBC: 3.16 MIL/uL — AB (ref 4.22–5.81)
RDW: 12.8 % (ref 11.5–15.5)
RDW: 12.8 % (ref 11.5–15.5)
WBC: 8.1 10*3/uL (ref 4.0–10.5)
WBC: 8.3 10*3/uL (ref 4.0–10.5)

## 2014-01-04 LAB — COMPREHENSIVE METABOLIC PANEL
ALK PHOS: 39 U/L (ref 39–117)
ALT: 19 U/L (ref 0–53)
AST: 56 U/L — AB (ref 0–37)
Albumin: 2.9 g/dL — ABNORMAL LOW (ref 3.5–5.2)
Anion gap: 15 (ref 5–15)
BUN: 18 mg/dL (ref 6–23)
CO2: 23 meq/L (ref 19–32)
Calcium: 8.5 mg/dL (ref 8.4–10.5)
Chloride: 100 mEq/L (ref 96–112)
Creatinine, Ser: 1.3 mg/dL (ref 0.50–1.35)
GFR calc non Af Amer: 56 mL/min — ABNORMAL LOW (ref >90)
GFR, EST AFRICAN AMERICAN: 65 mL/min — AB (ref >90)
GLUCOSE: 195 mg/dL — AB (ref 70–99)
POTASSIUM: 4.6 meq/L (ref 3.7–5.3)
Sodium: 138 mEq/L (ref 137–147)
Total Bilirubin: 0.7 mg/dL (ref 0.3–1.2)
Total Protein: 5.8 g/dL — ABNORMAL LOW (ref 6.0–8.3)

## 2014-01-04 LAB — CREATININE, SERUM
CREATININE: 1.31 mg/dL (ref 0.50–1.35)
GFR calc non Af Amer: 56 mL/min — ABNORMAL LOW (ref >90)
GFR, EST AFRICAN AMERICAN: 65 mL/min — AB (ref >90)

## 2014-01-04 LAB — GLUCOSE, CAPILLARY
GLUCOSE-CAPILLARY: 105 mg/dL — AB (ref 70–99)
GLUCOSE-CAPILLARY: 185 mg/dL — AB (ref 70–99)
GLUCOSE-CAPILLARY: 277 mg/dL — AB (ref 70–99)
Glucose-Capillary: 178 mg/dL — ABNORMAL HIGH (ref 70–99)
Glucose-Capillary: 163 mg/dL — ABNORMAL HIGH (ref 70–99)
Glucose-Capillary: 158 mg/dL — ABNORMAL HIGH (ref 70–99)

## 2014-01-04 MED ORDER — POLYETHYLENE GLYCOL 3350 17 G PO PACK
17.0000 g | PACK | Freq: Every day | ORAL | Status: DC
  Administered 2014-01-04 – 2014-01-06 (×3): 17 g via ORAL
  Filled 2014-01-04 (×4): qty 1

## 2014-01-04 MED ORDER — ENOXAPARIN SODIUM 40 MG/0.4ML ~~LOC~~ SOLN
40.0000 mg | SUBCUTANEOUS | Status: DC
Start: 2014-01-04 — End: 2014-01-07
  Administered 2014-01-04 – 2014-01-06 (×3): 40 mg via SUBCUTANEOUS
  Filled 2014-01-04 (×4): qty 0.4

## 2014-01-04 NOTE — Progress Notes (Signed)
 2mg  morphine IV push administered. Pt became flushed and diaphoretic. BP dropped from 135/65 to 104/46. HR sustained in 70s. Adverse reaction to med documented. Will continue to monitor.

## 2014-01-04 NOTE — Plan of Care (Signed)
Problem: Consults Goal: Skin Care Protocol Initiated - if Braden Score 18 or less If consults are not indicated, leave blank or document N/A  Outcome: Progressing Goal: Diabetes Guidelines if Diabetic/Glucose > 140 If diabetic or lab glucose is > 140 mg/dl - Initiate Diabetes/Hyperglycemia Guidelines & Document Interventions  Outcome: Progressing  Problem: Phase I Progression Outcomes Goal: Initial discharge plan identified Outcome: Progressing  Problem: Phase II Progression Outcomes Goal: Progress activity as tolerated unless otherwise ordered Outcome: Progressing Goal: Vital signs remain stable Outcome: Progressing Goal: Obtain order to discontinue catheter if appropriate Outcome: Completed/Met Date Met:  01/04/14

## 2014-01-04 NOTE — Progress Notes (Signed)
Patient to transfer to 2W36.  Report given to receiving nurse Asher MuirJamie, all questions answered at this time.  Patient stable at transfer.

## 2014-01-04 NOTE — Progress Notes (Addendum)
[facility] History and Physical  Patient: [name]  ZOX:[id number]  DOB: October 27, 1950  DOS: the patient was seen and examined on 01/04/2014 PCP: Milinda Antis, MD  Chief Complaint: left leg pain.  HPI: [name] is a 63 y.o. male with Past medical history of PVD, CAD, DM-2, HTN, ANEMIA, CKD, OSA, BPH, pacemaker implant. The patient presented with complaints of left leg pain. He mentions that he woke up with the symptoms in the morning the pain was started having this high and was radiating to his leg. While the day his pain was progressively worsening and therefore he decided to come to the hospital at [facility]. In any event he was found to have abnormal ABI and vascular surgery was consulted. Vascular surgery has followed up with the patient and recommend further workup and has requested the patient to be admitted to medicine service. At the time of my evaluation patient mentions the pain in the left leg has.     Subjective One episode of vomiting last night,, no BM in 2 days   Assessment and plan  1. Lower limb ischemia The patient is presenting with complaints of left leg pain. Being managed by vascular surgery aortogram and runoff 11/19 via a right femoral approach.    on 11/20 patient had  1.Thrombectomy of left limb of aortofemoral bypass graft 2. Extensive endarterectomy of the left common femoral artery and deep femoral artery 3. Bypass from the left limb of the aortofemoral bypass graft to the deep femoral artery using 8 mm Dacron graft 4. Intraoperative arteriogram of the left lower extremity 5. Thrombectomy of the right limb of the aortofemoral bypass graft 6. Endarterectomy of the right common femoral artery 7. Dacron patch angioplasty of distal right limb of aortofemoral bypass graft Patient had a low-grade fever after surgery, if recurs will order chest x-ray, blood culture 2 Currently afebrile  Ambulate per  vascular surgery  recommendations  2.diabetes mellitus.  uncontrolled Hemoglobin A1c  11.1 Continue Levemir and SSI  3.bilateral paresthesia. Patient is found to have bilateral sounds and numbness of the leg. As well as he has some mild numbness of his fingers. CT of the head is negative for any acute abnormality as well as x-rays cervical spine as well as x-ray lumbar spine. Continue gabapentin   4.coronary artery disease. Pacemaker implant. Continue with aspirin  , telemetry. Patient did have some chest pain 2 weeks ago but denies any chest pain on and off.   5. Accelerated hypertension. Blood pressure mildly elevated., DC ACE inhibitor, HCTZ in the setting of contrast study Started on prn hydralazine  Restart hydrochlorothiazide   Started clonidine patch  Blood pressures better today   6. Depression Psych Social work consulted Patient will need outpatient psych evaluation Currently denies suicidal homicidal ideation  7. Constipation Started the patient on Miralax    Advance goals of care discussion: full code as per my discussion with patient   Consults: vascular surgery   Family Communication: family was present at bedside, opportunity was given to ask question and all questions were answered satisfactorily at the time of interview. Disposition: As per vascular surgery       Prior to Admission medications   Medication Sig Start Date End Date Taking? Authorizing Provider  acetaminophen (TYLENOL) 500 MG tablet Take 2 tablets (1,000 mg total) by mouth every 6 (six) hours as needed. Pain. 05/08/13  Yes Salley Scarlet, MD  albuterol (PROVENTIL HFA;VENTOLIN HFA) 108 (90 BASE) MCG/ACT inhaler Inhale  2 puffs into the lungs every 4 (four) hours as needed. 06/21/13  Yes Salley ScarletKawanta F [place], MD  aspirin 81 MG tablet Take 1 tablet (81 mg total) by mouth daily. 05/08/13  Yes Salley ScarletKawanta F [place], MD  BAYER CONTOUR NEXT TEST test strip TEST THREE TIMES A DAY 08/07/13  Yes Salley ScarletKawanta F [place], MD   Insulin Detemir (LEVEMIR FLEXTOUCH) 100 UNIT/ML Pen Inject 45 Units into the skin daily at 10 pm. 10/24/13  Yes Salley ScarletKawanta F [place], MD  insulin lispro (HUMALOG) 100 UNIT/ML KiwkPen Inject 12 Units into the skin 3 (three) times daily with meals. 06/24/13  Yes Salley ScarletKawanta F [place], MD  lisinopril-hydrochlorothiazide (PRINZIDE,ZESTORETIC) 20-12.5 MG per tablet Take 2 tablets by mouth daily. 06/21/13  Yes Salley ScarletKawanta F [place], MD  metFORMIN (GLUCOPHAGE) 1000 MG tablet Take 1 tablet (1,000 mg total) by mouth 2 (two) times daily with a meal. 06/21/13  Yes Salley ScarletKawanta F [place], MD  metoprolol tartrate (LOPRESSOR) 25 MG tablet Take 1 tablet (25 mg total) by mouth 2 (two) times daily. 06/21/13  Yes Salley ScarletKawanta F [place], MD  simvastatin (ZOCOR) 10 MG tablet Take 1 tablet (10 mg total) by mouth at bedtime. 06/21/13  Yes Salley ScarletKawanta F [place], MD  triamcinolone cream (KENALOG) 0.1 % Apply 1 application topically 2 (two) times daily. Apply to leg 08/28/13  Yes Salley ScarletKawanta F [place], MD  gabapentin (NEURONTIN) 300 MG capsule Take 1 capsule (300 mg total) by mouth 3 (three) times daily. Patient not taking: Reported on 12/31/2013 06/21/13   Salley ScarletKawanta F [address], MD    Physical Exam: Filed Vitals:   01/04/14 0630 01/04/14 0700 01/04/14 0800 01/04/14 0857  BP:    143/59  Pulse:  75  70  Temp: 102.4 F (39.1 C)  98.8 F (37.1 C) 98 F (36.7 C)  TempSrc: Oral  Oral Oral  Resp:  24  18  Height:      Weight:      SpO2:  95%  100%    General: Alert, Awake and Oriented to Time, Place and Person. Appear in mild distress Eyes: PERRL ENT: Oral Mucosa clear moist. Neck: no JVD Cardiovascular: S1 and S2 Present, no Murmur, Peripheral Pulses Present Respiratory: Bilateral Air entry equal and Decreased, Clear to Auscultation, noCrackles, no wheezes Abdomen: Bowel Sound present, Soft and non tender Skin: no Rash Extremities: trace Pedal edema, no calf tenderness Neurologic: Grossly no focal neuro deficit. Bilateral lower limb numbness  Labs on  Admission:  CBC:  Recent Labs Lab 12/31/13 1339 01/01/14 0158 01/02/14 0439 01/03/14 0430 01/03/14 1153 01/03/14 2200 01/04/14 0420  WBC 7.6 6.6 6.4 7.3  --  8.1 8.3  NEUTROABS 5.7  --   --   --   --   --   --   HGB 12.7* 11.8* 11.4* 11.3* 9.5* 9.3* 8.7*  HCT 38.7* 36.0* 35.7* 35.2* 28.0* 28.8* 27.0*  MCV 83.2 82.2 83.0 83.4  --  86.0 85.4  PLT 207 211 212 245  --  188 182    CMP     Component Value Date/Time   NA 138 01/04/2014 0420   K 4.6 01/04/2014 0420   CL 100 01/04/2014 0420   CO2 23 01/04/2014 0420   GLUCOSE 195* 01/04/2014 0420   BUN 18 01/04/2014 0420   CREATININE 1.30 01/04/2014 0420   CREATININE 1.26 12/03/2013 0848   CALCIUM 8.5 01/04/2014 0420   PROT 5.8* 01/04/2014 0420   ALBUMIN 2.9* 01/04/2014 0420   AST 56* 01/04/2014 0420   ALT 19 01/04/2014 0420  ALKPHOS 39 01/04/2014 0420   BILITOT 0.7 01/04/2014 0420   GFRNONAA 56* 01/04/2014 0420   GFRNONAA 59* 08/28/2013 1206   GFRAA 65* 01/04/2014 0420   GFRAA 68 08/28/2013 1206    No results for input(s): LIPASE, AMYLASE in the last 168 hours. No results for input(s): AMMONIA in the last 168 hours.   Recent Labs Lab 12/31/13 2051  CKTOTAL 214  TROPONINI <0.30   BNP (last 3 results) No results for input(s): PROBNP in the last 8760 hours.  Radiological Exams on Admission: Dg Ang/ext/uni/or Left  01/03/2014   CLINICAL DATA:  Occluded left limb of the aortofemoral bypass graft and peripheral vascular disease.  EXAM: LEFT ANG/EXT/UNI/ OR  COMPARISON:  None.  FINDINGS: Intraoperative image was obtained which does not demonstrate any contrast in native arteries from the level of the knee joint to just above the ankle. Some vascular calcifications are seen in the proximal anterior tibial artery and throughout most of the peroneal artery. Clips are present in the lower leg.  IMPRESSION: Nonvisualization of tibial arteries or the popliteal artery by intraoperative arteriography.   Electronically Signed    By: Irish LackGlenn  Yamagata M.D.   On: 01/03/2014 14:18    Assessment/Plan Principal Problem:   Lower limb ischemia Active Problems:   Diabetic neuropathy   Essential hypertension   Arteriosclerotic cardiovascular disease (ASCVD)   Cerebrovascular disease   Peripheral vascular disease   Sick sinus syndrome   Obstructive sleep apnea      [facility] Pager: [phone]  01/04/2014, 10:45 AM    If 7PM-7AM, please contact night-coverage [email or web address] Password [email or web address]

## 2014-01-04 NOTE — Evaluation (Signed)
Physical Therapy Evaluation Patient Details Name: Collin LeitzWilliam F Vendetti MRN: [id number] DOB: 06/11/1950 Today's Date: 01/04/2014   History of Present Illness  63 y.o. male with Past medical history of PVD, CAD, DM-2, HTN, ANEMIA, CKD, OSA, BPH, pacemaker implant. S/p bil fem pop bypass graft.   Clinical Impression  Patient is s/p above surgery resulting in functional limitations due to the deficits listed below (see PT Problem List). Patient will benefit from skilled PT to increase their independence and safety with mobility to allow discharge to the venue listed below. Pt fatigues quickly and is impulsive with activity. Family reports pt lives in poor conditions at home and wife is in/out. Will recommend SNF at this time for post acute rehab.      Follow Up Recommendations SNF;Supervision/Assistance - 24 hour    Equipment Recommendations  Rolling walker with 5" wheels    Recommendations for Other Services OT consult     Precautions / Restrictions Precautions Precautions: Fall Restrictions Weight Bearing Restrictions: No      Mobility  Bed Mobility               General bed mobility comments: pt up in chair and returned to chair   Transfers Overall transfer level: Needs assistance Equipment used: Rolling walker (2 wheeled) Transfers: Sit to/from Stand Sit to Stand: Mod assist         General transfer comment: cues for hand placement and safety; mod (A) to power up and balance ; pt disregarding cues for safe technique and requiring multimodal max cueing   Ambulation/Gait Ambulation/Gait assistance: Min assist Ambulation Distance (Feet): 80 Feet (40' x 2 ) Assistive device: Rolling walker (2 wheeled) Gait Pattern/deviations: Step-through pattern;Decreased stride length;Shuffle;Wide base of support Gait velocity: decreased Gait velocity interpretation: Below normal speed for age/gender General Gait Details: cues for safe technique with RW and min (A) to balance and  manage RW   Stairs            Wheelchair Mobility    Modified Rankin (Stroke Patients Only)       Balance Overall balance assessment: Needs assistance;History of Falls Sitting-balance support: Feet supported;No upper extremity supported Sitting balance-Leahy Scale: Fair Sitting balance - Comments: denied any dizziness   Standing balance support: During functional activity;Bilateral upper extremity supported Standing balance-Leahy Scale: Poor Standing balance comment: relying heavily on RW and (A) to balance                              Pertinent Vitals/Pain Pain Assessment: No/denies pain Pain Location: pt initially c/o incr pain in Lt LE vs Rt; at end of session pt denying pain    Home Living Family/patient expects to be discharged to:: Skilled nursing facility Living Arrangements: Spouse/significant other               Additional Comments: family present and reported pt lives in poor conditions and wife is in/out     Prior Function Level of Independence: Independent         Comments: uses cane PRN for pain     Hand Dominance        Extremity/Trunk Assessment   Upper Extremity Assessment: Defer to OT evaluation           Lower Extremity Assessment: Generalized weakness (c/o incr numbness in lt Le )      Cervical / Trunk Assessment: Normal  Communication   Communication: Other (comment) (confabulating and dysarthric speech at  times; baseline )  Cognition Arousal/Alertness: Awake/alert Behavior During Therapy: Impulsive Overall Cognitive Status: Impaired/Different from baseline Area of Impairment: Problem solving;Safety/judgement;Attention   Current Attention Level: Sustained Memory: Decreased short-term memory   Safety/Judgement: Decreased awareness of deficits;Decreased awareness of safety   Problem Solving: Difficulty sequencing;Requires verbal cues;Requires tactile cues General Comments: family reports his dysarthric  and confabulating speech is baseline; pt is impulsive and has difficulty with safe technique/cues at times     General Comments General comments (skin integrity, edema, etc.): discussed D/C disposition with pt ; educated on importance of OOB for meals    Exercises Low Level/ICU Exercises Ankle Circles/Pumps: AROM;Both;10 reps;Seated      Assessment/Plan    PT Assessment Patient needs continued PT services  PT Diagnosis Difficulty walking;Generalized weakness;Acute pain   PT Problem List Decreased strength;Decreased activity tolerance;Decreased balance;Decreased mobility;Decreased cognition;Decreased knowledge of use of DME;Decreased safety awareness;Pain;Impaired sensation  PT Treatment Interventions DME instruction;Gait training;Functional mobility training;Therapeutic activities;Therapeutic exercise;Neuromuscular re-education;Balance training;Patient/family education   PT Goals (Current goals can be found in the Care Plan section) Acute Rehab PT Goals Patient Stated Goal: none stated PT Goal Formulation: With patient Time For Goal Achievement: 01/11/14 Potential to Achieve Goals: Good    Frequency Min 3X/week   Barriers to discharge Decreased caregiver support      Co-evaluation               End of Session Equipment Utilized During Treatment: Gait belt Activity Tolerance: Patient tolerated treatment well Patient left: in chair;with call bell/phone within reach Nurse Communication: Mobility status         Time: 1138-1201 PT Time Calculation (min) (ACUTE ONLY): 23 min   Charges:   PT Evaluation $Initial PT Evaluation Tier I: 1 Procedure PT Treatments $Gait Training: 8-22 mins   PT G CodesDonell Sievert:          Ajaya Crutchfield N, [place]PT  [phone] 01/04/2014, 1:05 PM

## 2014-01-04 NOTE — Progress Notes (Addendum)
  [facility] Progress Note  01/04/2014 8:56 AM 1 Day Post-Op  Subjective:  Having soreness in groins.   Tmax 102.4, currently afebrile BP sys 100s-160s 02 95% 2L  Filed Vitals:   01/04/14 0800  BP:   Pulse:   Temp: 98.8 F (37.1 C)  Resp:    Physical Exam: Incisions:  Bilateral groin incisions clean dry and intact without hematoma.  Extremities:  Monophasic DP/PT doppler signals bilaterally  CBC    Component Value Date/Time   WBC 8.3 01/04/2014 0420   RBC 3.16* 01/04/2014 0420   HGB 8.7* 01/04/2014 0420   HCT 27.0* 01/04/2014 0420   PLT 182 01/04/2014 0420   MCV 85.4 01/04/2014 0420   MCH 27.5 01/04/2014 0420   MCHC 32.2 01/04/2014 0420   RDW 12.8 01/04/2014 0420   LYMPHSABS 1.3 12/31/2013 1339   MONOABS 0.4 12/31/2013 1339   EOSABS 0.1 12/31/2013 1339   BASOSABS 0.0 12/31/2013 1339    BMET    Component Value Date/Time   NA 138 01/04/2014 0420   K 4.6 01/04/2014 0420   CL 100 01/04/2014 0420   CO2 23 01/04/2014 0420   GLUCOSE 195* 01/04/2014 0420   BUN 18 01/04/2014 0420   CREATININE 1.30 01/04/2014 0420   CREATININE 1.26 12/03/2013 0848   CALCIUM 8.5 01/04/2014 0420   GFRNONAA 56* 01/04/2014 0420   GFRNONAA 59* 08/28/2013 1206   GFRAA 65* 01/04/2014 0420   GFRAA 68 08/28/2013 1206   INR    Component Value Date/Time   INR 1.02 01/03/2014 0430    Intake/Output Summary (Last 24 hours) at 01/04/14 0856 Last data filed at 01/04/14 0800  Gross per 24 hour  Intake   3650 ml  Output   1560 ml  Net   2090 ml    Assessment:  63 y.o. male is s/p:  1. Thrombectomy of left limb of aortofemoral bypass graft 2. Extensive endarterectomy of the left common femoral artery and deep femoral artery 3. Bypass from the left limb of the aortofemoral bypass graft to the deep femoral artery using 8 mm Dacron graft 4. Intraoperative arteriogram of the left lower extremity 5. Thrombectomy of the right limb of the aortofemoral bypass graft 6.  Endarterectomy of the right common femoral artery 7. Dacron patch angioplasty of distal right limb of aortofemoral bypass graft  1 Day Post-Op  Plan: -Monophasic DP/PT doppler signals bilaterally. -Keep groin incisions dry with gauze. -Acute surgical blood loss anemia: Hgb 8.7. Asymptomatic. Will monitor.  -Fever last evening of 102.4, currently afebrile. No leukocytosis. Will monitor.  -Mobilize, OOB.  -DM: SSI -DVT prophylaxis:  Lovenox -Dispo: Will transfer to 2W   Maris BergerKimberly Trinh, PA-C [facility] Office: [phone] Pager: [phone] 01/04/2014 8:56 AM  Agree with above. Transfer to 2W Ambulate.   Waverly Ferrarihristopher Rome Echavarria, MD, FACS Beeper [phone] 01/04/2014

## 2014-01-05 DIAGNOSIS — N179 Acute kidney failure, unspecified: Secondary | ICD-10-CM

## 2014-01-05 LAB — BASIC METABOLIC PANEL
Anion gap: 18 — ABNORMAL HIGH (ref 5–15)
BUN: 25 mg/dL — ABNORMAL HIGH (ref 6–23)
CHLORIDE: 97 meq/L (ref 96–112)
CO2: 20 meq/L (ref 19–32)
Calcium: 8.3 mg/dL — ABNORMAL LOW (ref 8.4–10.5)
Creatinine, Ser: 1.75 mg/dL — ABNORMAL HIGH (ref 0.50–1.35)
GFR calc Af Amer: 46 mL/min — ABNORMAL LOW (ref >90)
GFR calc non Af Amer: 39 mL/min — ABNORMAL LOW (ref >90)
GLUCOSE: 138 mg/dL — AB (ref 70–99)
POTASSIUM: 4.4 meq/L (ref 3.7–5.3)
SODIUM: 135 meq/L — AB (ref 137–147)

## 2014-01-05 LAB — GLUCOSE, CAPILLARY
GLUCOSE-CAPILLARY: 166 mg/dL — AB (ref 70–99)
GLUCOSE-CAPILLARY: 225 mg/dL — AB (ref 70–99)
Glucose-Capillary: 160 mg/dL — ABNORMAL HIGH (ref 70–99)
Glucose-Capillary: 163 mg/dL — ABNORMAL HIGH (ref 70–99)
Glucose-Capillary: 180 mg/dL — ABNORMAL HIGH (ref 70–99)

## 2014-01-05 LAB — CBC
HCT: 28 % — ABNORMAL LOW (ref 39.0–52.0)
HEMOGLOBIN: 8.8 g/dL — AB (ref 13.0–17.0)
MCH: 26.5 pg (ref 26.0–34.0)
MCHC: 31.4 g/dL (ref 30.0–36.0)
MCV: 84.3 fL (ref 78.0–100.0)
Platelets: 162 10*3/uL (ref 150–400)
RBC: 3.32 MIL/uL — AB (ref 4.22–5.81)
RDW: 12.7 % (ref 11.5–15.5)
WBC: 9.7 10*3/uL (ref 4.0–10.5)

## 2014-01-05 MED ORDER — SODIUM CHLORIDE 0.9 % IJ SOLN
10.0000 mL | INTRAMUSCULAR | Status: DC | PRN
  Administered 2014-01-06 – 2014-01-07 (×2): 20 mL
  Filled 2014-01-05 (×2): qty 40

## 2014-01-05 MED ORDER — SENNOSIDES-DOCUSATE SODIUM 8.6-50 MG PO TABS
1.0000 | ORAL_TABLET | Freq: Two times a day (BID) | ORAL | Status: DC
  Administered 2014-01-05 – 2014-01-07 (×4): 1 via ORAL
  Filled 2014-01-05 (×6): qty 1

## 2014-01-05 NOTE — Progress Notes (Addendum)
[facility] PROGRESS NOTE  Bonney LeitzWilliam F Laflam ZOX:[id number] DOB: 04/21/1950 DOA: 12/31/2013 PCP: [name], [name], MD  Assessment/Plan: 63 y.o. male with Past medical history of PVD, CAD, DM-2, HTN, ANEMIA, CKD, OSA, BPH, pacemaker implant presented with complaints of left leg pain, admitted with L leg ischemia    1. Lower limb ischemia The patient is presenting with complaints of left leg pain. Being managed by vascular surgery 11/20: s/p Thrombectomy of left limb of aortofemoral bypass graft 2. Extensive endarterectomy of the left common femoral artery and deep femoral artery 3. Bypass from the left limb of the aortofemoral bypass graft to the deep femoral artery using 8 mm Dacron graft 4. Intraoperative arteriogram of the left lower extremity 5. Thrombectomy of the right limb of the aortofemoral bypass graft 6. Endarterectomy of the right common femoral artery 7. Dacron patch angioplasty of distal right limb of aortofemoral bypass graft -per vascular surgery   2. Diabetes mellitus. Uncontrolled Hemoglobin A1c 11.1 -Continue Levemir and SSI 3. Bilateral paresthesia. Patient is found to have bilateral sounds and numbness of the leg. As well as he has some mild numbness of his fingers. CT of the head is negative for any acute abnormality as well as x-rays cervical spine as well as x-ray lumbar spine. -Continue gabapentin 4. Coronary artery disease. Pacemaker implant. Patient denies acute cardiopulmonary symptoms  -Continue with aspirin , telemetry. 5. Accelerated hypertension. Blood pressure mildly elevated., hold ACE inhibitor, HCTZ in the setting of contrast study -on clonidine patch, metoprolol'; cont monitor, titrate as needed  6. DepressionPsych Social work consulted, patient will need outpatient psych evaluation -Currently denies suicidal homicidal ideation 7. Constipation -Started the patient on Miralax 8. AKI likely due to contrast on top of chronic DM nephropathy;  cont gentle IVFl; recheck renal function in AM  9. Post op fever; currently afebrile; no leukocytosis; cont monitor    PT: SNF, consult SW;   Code Status: full Family Communication: d/w patient, his brother at the bedside (indicate person spoken with, relationship, and if by phone, the number) Disposition Plan: pend PT; ? SNF   Consultants:  VSS  Procedures:  vascular as above   Antibiotics:  noen (indicate start date, and stop date if known)  HPI/Subjective: alert  Objective: Filed Vitals:   01/05/14 0503  BP: 134/66  Pulse: 70  Temp: 99.5 F (37.5 C)  Resp: 18    Intake/Output Summary (Last 24 hours) at 01/05/14 1001 Last data filed at 01/05/14 0400  Gross per 24 hour  Intake 1013.75 ml  Output    600 ml  Net 413.75 ml   Filed Weights   01/01/14 0500 01/02/14 0513 01/05/14 0503  Weight: 86.7 kg (191 lb 2.2 oz) 86.637 kg (191 lb) 93.622 kg (206 lb 6.4 oz)    Exam:   General:  Alert, oriented   Cardiovascular: s1,s2 rrr  Respiratory: few crackle at bases   Abdomen: soft, nt,nd   Musculoskeletal: mild leg edema   Data Reviewed: Basic Metabolic Panel:  Recent Labs Lab 12/31/13 1339 01/01/14 0533 01/02/14 2000 01/03/14 0430 01/03/14 1153 01/03/14 2200 01/04/14 0420 01/05/14 0439  NA 139 143  --  140 142  --  138 135*  K 4.4 4.1  --  4.9 3.4*  --  4.6 4.4  CL 97 105  --  101  --   --  100 97  CO2 29 25  --  25  --   --  23 20  GLUCOSE 333* 131*  --  278* 125*  --  195* 138*  BUN 25* 21  --  16  --   --  18 25*  CREATININE 1.28 1.24 1.10 1.11  --  1.31 1.30 1.75*  CALCIUM 10.2 9.7  --  9.9  --   --  8.5 8.3*   Liver Function Tests:  Recent Labs Lab 01/01/14 0533 2014/01/04 0430 01/04/14 0420  AST 19 21 56*  ALT 16 16 19   ALKPHOS 54 51 39  BILITOT 0.7 0.5 0.7  PROT 6.9 7.2 5.8*  ALBUMIN 3.4* 3.6 2.9*   No results for input(s): LIPASE, AMYLASE in the last 168 hours. No results for input(s): AMMONIA in the last 168  hours. CBC:  Recent Labs Lab 12/31/13 1339  01/02/14 0439 01/04/14 0430 01/04/2014 1153 01-04-2014 2200 01/04/14 0420 01/05/14 0439  WBC 7.6  < > 6.4 7.3  --  8.1 8.3 9.7  NEUTROABS 5.7  --   --   --   --   --   --   --   HGB 12.7*  < > 11.4* 11.3* 9.5* 9.3* 8.7* 8.8*  HCT 38.7*  < > 35.7* 35.2* 28.0* 28.8* 27.0* 28.0*  MCV 83.2  < > 83.0 83.4  --  86.0 85.4 84.3  PLT 207  < > 212 245  --  188 182 162  < > = values in this interval not displayed. Cardiac Enzymes:  Recent Labs Lab 12/31/13 2051  CKTOTAL 214  TROPONINI <0.30   BNP (last 3 results) No results for input(s): PROBNP in the last 8760 hours. CBG:  Recent Labs Lab 01/04/14 1230 01/04/14 1620 01/04/14 2239 01/05/14 0008 01/05/14 0626  GLUCAP 163* 105* 158* 180* 166*    Recent Results (from the past 240 hour(s))  Surgical pcr screen     Status: None   Collection Time: 01/02/14  9:34 PM  Result Value Ref Range Status   MRSA, PCR NEGATIVE NEGATIVE Final   Staphylococcus aureus NEGATIVE NEGATIVE Final    Comment:        The Xpert SA Assay (FDA approved for NASAL specimens in patients over 24 years of age), is one component of a comprehensive surveillance program.  Test performance has been validated by [employer] for patients greater than or equal to 64 year old. It is not intended to diagnose infection nor to guide or monitor treatment.      Studies: Dg Ang/ext/uni/or Left  01-04-14   CLINICAL DATA:  Occluded left limb of the aortofemoral bypass graft and peripheral vascular disease.  EXAM: LEFT ANG/EXT/UNI/ OR  COMPARISON:  None.  FINDINGS: Intraoperative image was obtained which does not demonstrate any contrast in native arteries from the level of the knee joint to just above the ankle. Some vascular calcifications are seen in the proximal anterior tibial artery and throughout most of the peroneal artery. Clips are present in the lower leg.  IMPRESSION: Nonvisualization of tibial arteries or the  popliteal artery by intraoperative arteriography.   Electronically Signed   By: Irish Lack M.D.   On: 01/04/2014 14:18    Scheduled Meds: . aspirin EC  81 mg Oral Daily  . cloNIDine  0.1 mg Transdermal Weekly  . docusate sodium  100 mg Oral Daily  . enoxaparin (LOVENOX) injection  40 mg Subcutaneous Q24H  . gabapentin  300 mg Oral TID  . hydrochlorothiazide  25 mg Oral Daily  . insulin aspart  0-15 Units Subcutaneous Q6H  . insulin aspart  10 Units Subcutaneous TID  WC  . insulin detemir  45 Units Subcutaneous QHS  . metoprolol tartrate  25 mg Oral BID  . pantoprazole  40 mg Oral Daily  . polyethylene glycol  17 g Oral Daily  . simvastatin  10 mg Oral QHS  . sodium chloride  3 mL Intravenous Q12H   Continuous Infusions: . sodium chloride 75 mL/hr at 01/05/14 [id number]    Principal Problem:   Lower limb ischemia Active Problems:   Diabetic neuropathy   Essential hypertension   Arteriosclerotic cardiovascular disease (ASCVD)   Cerebrovascular disease   Peripheral vascular disease   Sick sinus syndrome   Obstructive sleep apnea    Time spent: >35 minutes     Esperanza SheetsBURIEV, Aniela Caniglia N  [facility] Pager [phone]. If 7PM-7AM, please contact night-coverage at [email or web address], password [email or web address] 01/05/2014, 10:01 AM  LOS: 5 days

## 2014-01-05 NOTE — Progress Notes (Signed)
   VASCULAR SURGERY ASSESSMENT & PLAN:  * 2 Days Post-Op s/p: Thrombectomy of both limbs of the aortofemoral bypass graft, bilateral femoral endarterectomies, an interposition Dacron graft from the left limb of the aortofemoral graft to the left deep femoral artery. The patient had extensive diffuse atherosclerotic disease on the left with no further options for revascularization.  *  The left foot is viable with a reasonable anterior tibial signal with the Doppler. He was able to ambulate yesterday.  * Given his situation at home ( according to one family member the home is not in the best condition), social work has been consult it for consideration for skilled nursing facility placement.  * Continue ambulation  * Continue meticulous wound care to the groins  SUBJECTIVE: No specific complaints except for some continued paresthesias in the left foot.  PHYSICAL EXAM: Filed Vitals:   01/04/14 2013 01/04/14 2025 01/04/14 2213 01/05/14 0503  BP: 166/66   134/66  Pulse: 79 61  70  Temp: 99.6 F (37.6 C)  100.1 F (37.8 C) 99.5 F (37.5 C)  TempSrc: Axillary  Oral Oral  Resp: 16   18  Height:      Weight:    206 lb 6.4 oz (93.622 kg)  SpO2: 84% 99%  100%   Monophasic left anterior tibial signal with the Doppler. The left foot is warm and appears adequately perfused. Risk dorsalis pedis signal on the right.  LABS: Lab Results  Component Value Date   WBC 9.7 01/05/2014   HGB 8.8* 01/05/2014   HCT 28.0* 01/05/2014   MCV 84.3 01/05/2014   PLT 162 01/05/2014   Lab Results  Component Value Date   CREATININE 1.75* 01/05/2014   Lab Results  Component Value Date   INR 1.02 01/03/2014   CBG (last 3)   Recent Labs  01/04/14 2239 01/05/14 0008 01/05/14 0626  GLUCAP 158* 180* 166*    Principal Problem:   Lower limb ischemia Active Problems:   Diabetic neuropathy   Essential hypertension   Arteriosclerotic cardiovascular disease (ASCVD)   Cerebrovascular disease  Peripheral vascular disease   Sick sinus syndrome   Obstructive sleep apnea   Cari CarawayChris Caela Huot Beeper: [phone] 01/05/2014

## 2014-01-06 ENCOUNTER — Other Ambulatory Visit: Payer: Self-pay | Admitting: *Deleted

## 2014-01-06 ENCOUNTER — Telehealth: Payer: Self-pay | Admitting: Vascular Surgery

## 2014-01-06 DIAGNOSIS — Z9889 Other specified postprocedural states: Secondary | ICD-10-CM

## 2014-01-06 DIAGNOSIS — Z48812 Encounter for surgical aftercare following surgery on the circulatory system: Secondary | ICD-10-CM

## 2014-01-06 DIAGNOSIS — I739 Peripheral vascular disease, unspecified: Secondary | ICD-10-CM

## 2014-01-06 LAB — GLUCOSE, CAPILLARY
GLUCOSE-CAPILLARY: 189 mg/dL — AB (ref 70–99)
Glucose-Capillary: 161 mg/dL — ABNORMAL HIGH (ref 70–99)
Glucose-Capillary: 201 mg/dL — ABNORMAL HIGH (ref 70–99)
Glucose-Capillary: 75 mg/dL (ref 70–99)

## 2014-01-06 LAB — BASIC METABOLIC PANEL
ANION GAP: 15 (ref 5–15)
BUN: 30 mg/dL — ABNORMAL HIGH (ref 6–23)
CALCIUM: 8.1 mg/dL — AB (ref 8.4–10.5)
CO2: 19 meq/L (ref 19–32)
CREATININE: 1.71 mg/dL — AB (ref 0.50–1.35)
Chloride: 97 mEq/L (ref 96–112)
GFR calc Af Amer: 47 mL/min — ABNORMAL LOW (ref >90)
GFR, EST NON AFRICAN AMERICAN: 41 mL/min — AB (ref >90)
Glucose, Bld: 148 mg/dL — ABNORMAL HIGH (ref 70–99)
Potassium: 4.5 mEq/L (ref 3.7–5.3)
Sodium: 131 mEq/L — ABNORMAL LOW (ref 137–147)

## 2014-01-06 LAB — CBC
HCT: 26.1 % — ABNORMAL LOW (ref 39.0–52.0)
Hemoglobin: 8.4 g/dL — ABNORMAL LOW (ref 13.0–17.0)
MCH: 27.7 pg (ref 26.0–34.0)
MCHC: 32.2 g/dL (ref 30.0–36.0)
MCV: 86.1 fL (ref 78.0–100.0)
Platelets: 156 10*3/uL (ref 150–400)
RBC: 3.03 MIL/uL — ABNORMAL LOW (ref 4.22–5.81)
RDW: 12.8 % (ref 11.5–15.5)
WBC: 8.6 10*3/uL (ref 4.0–10.5)

## 2014-01-06 MED ORDER — OXYCODONE HCL 5 MG PO TABS: 5.0000 mg | ORAL_TABLET | Freq: Four times a day (QID) | ORAL | Status: DC | PRN

## 2014-01-06 NOTE — Clinical Social Work Note (Signed)
Spoke to patient and his family to discuss bed offers.  [facility] and [place] [facility] [place], [facility] and spoke to [facility], who said they can take patient, just waiting for authorization from insurance company.  [facility] is still awaiting authorization.  Informed patient's family that as soon as authorization patient can be discharged once orders have been received.  Informed patient and family it will probably be tomorrow, CSW will update family in morning.  Ervin KnackEric R. Arda Daggs, MSW, Theresia MajorsLCSWA [phone] 01/06/2014 5:18 PM

## 2014-01-06 NOTE — Progress Notes (Signed)
Medicare Important Message given? YES  (If response is "NO", the following Medicare IM given date fields will be blank)  Date Medicare IM given: 01/06/14 Medicare IM given by:  Chenise Mulvihill  

## 2014-01-06 NOTE — Discharge Instructions (Signed)

## 2014-01-06 NOTE — Plan of Care (Signed)
Problem: Phase II Progression Outcomes Goal: Pain controlled Outcome: Completed/Met Date Met:  01/06/14 Goal: Progress activity as tolerated unless otherwise ordered Outcome: Completed/Met Date Met:  01/06/14     

## 2014-01-06 NOTE — Progress Notes (Signed)
Physical Therapy Treatment Patient Details Name: Bonney LeitzWilliam F Dettinger MRN: [id number] DOB: 11/15/1950 Today's Date: 01/06/2014    History of Present Illness 63 y.o. male with Past medical history of PVD, CAD, DM-2, HTN, ANEMIA, CKD, OSA, BPH, pacemaker implant. S/p bil fem pop bypass graft.     PT Comments    Pt progressing slowly towards physical therapy goals. Requires min assist at all times for balance and walker placement due to poor control and instability. Tolerated therapeutic exercises with frequent cues for directions. Patient will continue to benefit from skilled physical therapy services to further improve independence with functional mobility.   Follow Up Recommendations  SNF;Supervision/Assistance - 24 hour     Equipment Recommendations  Rolling walker with 5" wheels    Recommendations for Other Services OT consult     Precautions / Restrictions Precautions Precautions: Fall    Mobility  Bed Mobility Overal bed mobility: Needs Assistance Bed Mobility: Rolling;Sidelying to Sit;Sit to Supine Rolling: Min guard Sidelying to sit: Min assist   Sit to supine: Min guard   General bed mobility comments: Pt requires assist to lift shoulders from bed. heavy use of rail. Min guard for safety while getting back into bed.  Transfers Overall transfer level: Needs assistance Equipment used: Rolling walker (2 wheeled) Transfers: Sit to/from Stand Sit to Stand: Min assist Stand pivot transfers: Min assist       General transfer comment: Min assist for balance and walker stability. Cues for hand placement. Impulsive to stand and difficulty following commands with this task.  Ambulation/Gait Ambulation/Gait assistance: Min assist Ambulation Distance (Feet): 65 Feet Assistive device: Rolling walker (2 wheeled) Gait Pattern/deviations: Step-through pattern;Decreased stride length;Leaning posteriorly;Drifts right/left;Trunk flexed;Wide base of support Gait velocity:  decreased Gait velocity interpretation: Below normal speed for age/gender General Gait Details: Min assist at all times for walker placement closer to base of support and several instances where pt losses balance posteriorly and anteriorly. Max VC for technique and education on RW use.   Stairs            Wheelchair Mobility    Modified Rankin (Stroke Patients Only)       Balance Overall balance assessment: Needs assistance Sitting-balance support: Feet supported Sitting balance-Leahy Scale: Fair     Standing balance support: Bilateral upper extremity supported Standing balance-Leahy Scale: Poor                      Cognition Arousal/Alertness: Awake/alert Behavior During Therapy: Impulsive Overall Cognitive Status: Impaired/Different from baseline Area of Impairment: Attention;Safety/judgement;Problem solving   Current Attention Level: Sustained     Safety/Judgement: Decreased awareness of safety;Decreased awareness of deficits   Problem Solving: Decreased initiation;Difficulty sequencing;Requires verbal cues;Requires tactile cues;Slow processing      Exercises General Exercises - Lower Extremity Ankle Circles/Pumps: AROM;AAROM;Both;15 reps;Supine Quad Sets: AROM;Both;10 reps;Supine Long Arc Quad: Strengthening;Both;10 reps;Seated Heel Slides: Strengthening;AROM;Left;10 reps;Supine Hip ABduction/ADduction: 10 reps;AAROM;Strengthening;Supine;Left    General Comments        Pertinent Vitals/Pain Pain Assessment: No/denies pain Pain Location:  (States LLE is 10/10 when moving )   SpO2 90-95% on room air during therapy    Home Living Family/patient expects to be discharged to:: Skilled nursing facility                    Prior Function Level of Independence: Independent      Comments: uses cane PRN for pain   PT Goals (current goals can now be found in the care  plan section) Acute Rehab PT Goals Patient Stated Goal: Pt did not  state PT Goal Formulation: With patient Time For Goal Achievement: 01/11/14 Potential to Achieve Goals: Good Progress towards PT goals: Progressing toward goals    Frequency  Min 3X/week    PT Plan Current plan remains appropriate    Co-evaluation             End of Session Equipment Utilized During Treatment: Gait belt Activity Tolerance: Patient tolerated treatment well Patient left: in bed;with call bell/phone within reach;with bed alarm set;with family/visitor present     Time: 1610-96041407-1432 PT Time Calculation (min) (ACUTE ONLY): 25 min  Charges:  $Gait Training: 8-22 mins $Therapeutic Exercise: 8-22 mins                    G Codes:      Berton MountBarbour, Kuzey Ogata S 01/06/2014, 3:29 PM  Sunday SpillersLogan Secor MeadvilleBarbour, [place] CarolinaPT [phone]

## 2014-01-06 NOTE — Evaluation (Signed)
Occupational Therapy Evaluation Patient Details Name: [name] MRN: [id number] DOB: 05/06/1950 Today's Date: 01/06/2014    History of Present Illness 63 y.o. male with Past medical history of PVD, CAD, DM-2, HTN, ANEMIA, CKD, OSA, BPH, pacemaker implant. S/p bil fem pop bypass graft.    Clinical Impression   Pt admitted with above.  He presents to OT with impaired cognition - poor safety awareness; impaired balance, and generalized weakness.  Currently, he requires min - max A for BADLs.  Recommend SNF for continued rehab prior to return home.     Follow Up Recommendations  SNF    Equipment Recommendations  None recommended by OT    Recommendations for Other Services       Precautions / Restrictions Precautions Precautions: Fall      Mobility Bed Mobility Overal bed mobility: Needs Assistance Bed Mobility: Rolling;Sidelying to Sit;Sit to Supine Rolling: Min guard Sidelying to sit: Min assist   Sit to supine: Min guard   General bed mobility comments: Pt requires assist to lift shoulders from bed   Transfers Overall transfer level: Needs assistance Equipment used: Rolling walker (2 wheeled) Transfers: Sit to/from [employer] CorporationStand;Stand Pivot Transfers Sit to Stand: Min assist Stand pivot transfers: Min assist       General transfer comment: verbal cues for hand placement, min A to power up and for balance.  Pt impulsive    Balance Overall balance assessment: Needs assistance Sitting-balance support: Feet supported Sitting balance-Leahy Scale: Fair     Standing balance support: Bilateral upper extremity supported Standing balance-Leahy Scale: Poor                              ADL Overall ADL's : Needs assistance/impaired Eating/Feeding: Independent   Grooming: Wash/dry hands;Wash/dry face;Oral care;Set up;Sitting;Supervision/safety   Upper Body Bathing: Minimal assitance;Sitting   Lower Body Bathing: Maximal assistance;Sit to/from stand    Upper Body Dressing : Minimal assistance;Sitting   Lower Body Dressing: Maximal assistance;Sit to/from stand   Toilet Transfer: Minimal assistance;Stand-pivot;BSC;RW   Toileting- Clothing Manipulation and Hygiene: Moderate assistance;Sit to/from stand       Functional mobility during ADLs: Minimal assistance General ADL Comments: Pt requires cues for safety.  He demonstrates difficulty accessing LEs for LB ADLs     Vision                     Perception     Praxis      Pertinent Vitals/Pain Pain Assessment: No/denies pain     Hand Dominance Right   Extremity/Trunk Assessment Upper Extremity Assessment Upper Extremity Assessment: Generalized weakness   Lower Extremity Assessment Lower Extremity Assessment: Defer to PT evaluation   Cervical / Trunk Assessment Cervical / Trunk Assessment: Normal   Communication Communication Communication: Other (comment) (low volume, mumbles.  Difficult to understand )   Cognition Arousal/Alertness: Awake/alert Behavior During Therapy: Impulsive Overall Cognitive Status: Impaired/Different from baseline Area of Impairment: Attention;Safety/judgement;Problem solving   Current Attention Level: Sustained     Safety/Judgement: Decreased awareness of safety;Decreased awareness of deficits   Problem Solving: Decreased initiation;Difficulty sequencing;Requires verbal cues;Requires tactile cues     General Comments       Exercises       Shoulder Instructions      Home Living Family/patient expects to be discharged to:: Skilled nursing facility  Prior Functioning/Environment Level of Independence: Independent        Comments: uses cane PRN for pain    OT Diagnosis: Generalized weakness   OT Problem List: Decreased strength;Decreased activity tolerance;Impaired balance (sitting and/or standing);Decreased safety awareness;Decreased knowledge of use of DME or  AE;Obesity   OT Treatment/Interventions: Self-care/ADL training;DME and/or AE instruction;Therapeutic activities;Patient/family education;Balance training    OT Goals(Current goals can be found in the care plan section) Acute Rehab OT Goals Patient Stated Goal: Pt did not state OT Goal Formulation: All assessment and education complete, DC therapy  OT Frequency:     Barriers to D/C: Decreased caregiver support          Co-evaluation              End of Session Equipment Utilized During Treatment: Rolling walker  Activity Tolerance: Patient tolerated treatment well Patient left: in bed;with call bell/phone within reach;with family/visitor present   Time: [id number] OT Time Calculation (min): 27 min Charges:  OT General Charges $OT Visit: 1 Procedure OT Evaluation $Initial OT Evaluation Tier I: 1 Procedure OT Treatments $Therapeutic Activity: 8-22 mins G-Codes:    Katryna Tschirhart M 01/06/2014, 3:24 PM

## 2014-01-06 NOTE — Progress Notes (Signed)
[facility] --- Instructions: Press F2 to tab through selections.  Delete question if not applicable.   Post-op:  Wound infection: No  Graft infection: No  Transfusion: No  If yes, n/a units given New Arrhythmia: No Ipsilateral amputation: No, [ ]  Minor, [ ]  BKA, [ ]  AKA Discharge patency: [ ]  Primary, [ x] Primary assisted, [ ]  Secondary, [ ]  Occluded Patency judged by: [ x] Dopper only, [ ]  Palpable graft pulse, [ ]  Palpable distal pulse, [ ]  ABI inc. > 0.15, [ ]  Duplex Discharge ABI: R not done, L not done (will have them perform ABI's in our office at follow up) Discharge TBI: R , L  D/C Ambulatory Status: Ambulatory with Assistance  Complications: MI: No, [ ]  Troponin only, [ ]  EKG or Clinical CHF: No Resp failure:No, [ ]  Pneumonia, [ ]  Ventilator Chg in renal function: No, [ ]  Inc. Cr > 0.5, [ ]  Temp. Dialysis, [ ]  Permanent dialysis Stroke: No, [ ]  Minor, [ ]  Major Return to OR: No  Reason for return to OR: [ ]  Bleeding, [ ]  Infection, [ ]  Thrombosis, [ ]  Revision  Discharge medications: Statin use:  yes ASA use:  yes Plavix use:  no Beta blocker use: yes Coumadin use: no

## 2014-01-06 NOTE — Clinical Social Work Psychosocial (Signed)
Clinical Social Work Department BRIEF PSYCHOSOCIAL ASSESSMENT 01/06/2014  Patient:  [name],[name]     Account Number:  [id number]     Admit date:  12/31/2013  Clinical Social Worker:  Elouise MunroeANTERHAUS,Shonique Pelphrey, LCSWA  Date/Time:  01/06/2014 05:20 PM  Referred by:  Physician  Date Referred:  01/06/2014 Referred for  SNF Placement   Other Referral:   Interview type:  Patient Other interview type:   family    PSYCHOSOCIAL DATA Living Status:  WIFE Admitted from facility:   Level of care:   Primary support name:   Primary support relationship to patient:  SPOUSE Degree of support available:   Patient's spouse and family members are quite involved in patient's care.  Patient has strong support network.    CURRENT CONCERNS Current Concerns  Post-Acute Placement   Other Concerns:    SOCIAL WORK ASSESSMENT / PLAN Patient is a 63 year old male who lives with his wife. Patient was talkative, but alert and oriented.  Patient states he is ready to discharge as soon as he can to get some rehab so he can return home. Once patient receives discharge orders patient and family would like to [facility] then return back home. Patient and family agreeable to plan of care.   Assessment/plan status:   Other assessment/ plan:   Information/referral to community resources:    PATIENT'S/FAMILY'S RESPONSE TO PLAN OF CARE: Patient and family in agreement to plan of care.   Ervin KnackEric R. Sieanna Vanstone, MSW, Theresia MajorsLCSWA [phone] 01/06/2014 5:25 PM

## 2014-01-06 NOTE — Telephone Encounter (Addendum)
-----   Message from Sharee PimpleMarilyn K McChesney, RN sent at 01/06/2014 10:02 AM EST ----- Regarding: Schedule   ----- Message -----    From: Dara LordsSamantha J Rhyne, PA-C    Sent: 01/06/2014   7:40 AM      To: [facility] Charge Pool  S/p bilateral femoral thrombectomies  F/u with Dr. Edilia Boickson in 2 weeks.  Thanks, Samantha     01/06/14: no VM- mailed letter to home address, dpm

## 2014-01-06 NOTE — Clinical Social Work Placement (Addendum)
Clinical Social Work Department CLINICAL SOCIAL WORK PLACEMENT NOTE 01/06/2014  Patient:  [name],[name]  Account Number:  [id number] Admit date:  12/31/2013  Clinical Social Worker:  Reo Portela, LCSWA  Date/time:  01/06/2014 05:26 PM  Clinical Social Work is seeking post-discharge placement for this patient at the following level of care:   SKILLED NURSING   (*CSW will update this form in Epic as items are completed)   01/06/2014  Patient/family provided with [facility] Department of Clinical Social Work's list of facilities offering this level of care within the geographic area requested by the patient (or if unable, by the patient's family).  01/06/2014  Patient/family informed of their freedom to choose among providers that offer the needed level of care, that participate in Medicare, Medicaid or managed care program needed by the patient, have an available bed and are willing to accept the patient.  01/06/2014  Patient/family informed of [facility]' ownership interest in [facility], as well as of the fact that they are under no obligation to receive care at this facility.  PASARR submitted to EDS on 01/06/2014 PASARR number received on 01/06/2014  FL2 transmitted to all facilities in geographic area requested by pt/family on  01/06/2014 FL2 transmitted to all facilities within larger geographic area on 01/06/2014  Patient informed that his/her managed care company has contracts with or will negotiate with  certain facilities, including the following:     Patient/family informed of bed offers received:  01/06/2014 Patient chooses bed at [facility] Physician recommends and patient chooses bed at    [facility]   Patient to be transferred to facility by [facility] Patient and family notified of transfer on 01/07/14  Name of family member notified: Ky BarbanVelma Davis    The following physician request were entered in  Epic:   Additional Comments:  Ervin KnackEric R. Tommaso Cavitt, MSW, Theresia MajorsLCSWA [phone] 01/07/2014 4:22 PM

## 2014-01-06 NOTE — Progress Notes (Signed)
VASCULAR LAB PRELIMINARY  ARTERIAL  ABI completed:    RIGHT    LEFT    PRESSURE WAVEFORM  PRESSURE WAVEFORM  BRACHIAL 190 Triphasic BRACHIAL  Triphasic  DP 101 Dampened Monophasic DP  Dampened Monophasic  PT 161 Dampened Monophasic PT      RIGHT LEFT  ABI 0.85    Severely limited study. Patient became uncooperative and constantly moved his foot and arm worsening when he was asked if he could be still. Feel that this was voluntary movement. ABI on the right indicates a mild reduction in arterial flow however Doppler waveforms would suggest a false elevation. Unable to obtain ABIs on the left due to constant movement and patient's unwillingness to cooperate.  Nero Sawatzky, RVS 01/06/2014, 11:57 AM

## 2014-01-06 NOTE — Discharge Summary (Signed)
Physician Discharge Summary  [name]:[id number] DOB: 11-20-1950 DOA: 12/31/2013  PCP: Milinda Antis, MD  Admit date: 12/31/2013 Discharge date: 01/06/2014  Recommendations for Outpatient Follow-up:  1. Pt will need to follow up with PCP in 2 weeks post discharge 2. Please obtain BMP to evaluate electrolytes and kidney function 3. Please also check CBC to evaluate Hg and Hct levels 4. Please note that lisinopril and metformin were as continued due to acute renal insufficiency, both medications can be resumed once renal function stabilizes  Discharge Diagnoses:  Principal Problem:   Lower limb ischemia Active Problems:   Diabetic neuropathy   Essential hypertension   Arteriosclerotic cardiovascular disease (ASCVD)   Cerebrovascular disease   Peripheral vascular disease   Sick sinus syndrome   Obstructive sleep apnea  Discharge Condition: Stable  Diet recommendation: Heart healthy diet discussed in details   History of present illness:  63 y.o. male with Past medical history of PVD, CAD, DM-2, HTN, ANEMIA, CKD, OSA, BPH, pacemaker implant presented with complaints of left leg pain, admitted with L leg ischemia   1. Lower limb ischemia  The patient is presenting with complaints of left leg pain. Being managed by vascular surgery  11/20: s/p Thrombectomy of left limb of aortofemoral bypass graft  2. Extensive endarterectomy of the left common femoral artery and deep femoral artery  3. Bypass from the left limb of the aortofemoral bypass graft to the deep femoral artery using 8 mm Dacron graft  4. Intraoperative arteriogram of the left lower extremity  5. Thrombectomy of the right limb of the aortofemoral bypass graft  6. Endarterectomy of the right common femoral artery  7. Dacron patch angioplasty of distal right limb of aortofemoral bypass graft  2. Diabetes mellitus. Uncontrolled with complications of neuropathy  -Hemoglobin A1c 11.1  -Continue Levemir  -Hold  metformin until renal function stabilizes 3. Bilateral paresthesia. Patient is found to have bilateral sounds and numbness of the leg. As well as he has some mild numbness of his fingers. CT of the head is negative for any acute abnormality as well as x-rays cervical spine as well as x-ray lumbar spine.  -Continue gabapentin  4. Coronary artery disease. Pacemaker implant. Patient denies acute cardiopulmonary symptoms  -Continue with aspirin , telemetry.  5. Accelerated hypertension.  Blood pressure mildly elevated., hold ACE inhibitor, HCTZ in the setting of contrast study  -on clonidine patch, metoprolol'; cont monitor, titrate as needed  6. DepressionPsych Social work consulted, patient will need outpatient psych evaluation  -Currently denies suicidal homicidal ideation  7. Constipation  -Started the patient on Miralax  8. AKI likely due to contrast, ? Early stages of Dm nephropathy, hold metformin and lisinopril  Until renal function stablilizes  9. Post op fever; currently afebrile; no leukocytosis; cont monitor   Code Status: full  Family Communication: d/w patient Disposition Plan: SNF   Consultants:  VSS Procedures:  vascular as above  Studies: Dg Cervical Spine 2 Or 3 Views  12/31/2013   CLINICAL DATA:  Posterior neck pain without injury, initial encounter  EXAM: CERVICAL SPINE - 2-3 VIEW  COMPARISON:  None.  FINDINGS: Seven cervical segments are well visualized. Vertebral body height is well maintained. Heavy carotid calcifications are noted bilaterally. No acute fracture or acute facet abnormality is noted. The odontoid is within normal limits.  IMPRESSION: No acute abnormality noted.   Electronically Signed   By: Alcide Clever M.D.   On: 12/31/2013 22:57   Dg Lumbar Spine 2-3 Views  12/31/2013   CLINICAL DATA:  No injury. Pain down both legs with low back pain and right leg paresthesias.  EXAM: LUMBAR SPINE - 2-3 VIEW  COMPARISON:  06/01/2007  FINDINGS: Normal alignment of the  lumbar spine. Mild diffuse degenerative changes predominate involving the facet joints. Minimal endplate hypertrophic changes are present. No vertebral compression deformities. Intervertebral disc space heights are preserved. Extensive vascular calcifications noted in the aorta and iliac arteries. Surgical clips present in the abdomen and pelvis.  IMPRESSION: No acute bony abnormalities. Normal alignment. Degenerative changes predominate in the facet joints. Vascular calcifications.   Electronically Signed   By: Burman NievesWilliam  Stevens M.D.   On: 12/31/2013 22:58   Ct Head Wo Contrast  12/31/2013   CLINICAL DATA:  Left-sided weakness  EXAM: CT HEAD WITHOUT CONTRAST  TECHNIQUE: Contiguous axial images were obtained from the base of the skull through the vertex without intravenous contrast.  COMPARISON:  None.  FINDINGS: The bony calvarium is intact. No gross soft tissue abnormality is noted. The paranasal sinuses show evidence of a large mucosal retention cyst in the right maxillary antrum. The ventricles are of normal size and configuration. No findings to suggest acute hemorrhage, acute infarction or space-occupying mass lesion is noted.  IMPRESSION: No acute intracranial abnormality noted.   Electronically Signed   By: Alcide CleverMark  Lukens M.D.   On: 12/31/2013 23:21   [place] Venous Img Lower Unilateral Left  12/31/2013   CLINICAL DATA:  63 year old male with left lower extremity pain, swelling and numbness  EXAM: LEFT LOWER EXTREMITY VENOUS DOPPLER ULTRASOUND  TECHNIQUE: Gray-scale sonography with graded compression, as well as color Doppler and duplex ultrasound were performed to evaluate the lower extremity deep venous systems from the level of the common femoral vein and including the common femoral, femoral, profunda femoral, popliteal and calf veins including the posterior tibial, peroneal and gastrocnemius veins when visible. The superficial great saphenous vein was also interrogated. Spectral Doppler was utilized to  evaluate flow at rest and with distal augmentation maneuvers in the common femoral, femoral and popliteal veins.  COMPARISON:  Prior duplex venous ultrasound of the left lower extremity 01/20/2012  FINDINGS: Contralateral Common Femoral Vein: Respiratory phasicity is normal and symmetric with the symptomatic side. No evidence of thrombus. Normal compressibility.  Common Femoral Vein: No evidence of thrombus. Normal compressibility, respiratory phasicity and response to augmentation.  Saphenofemoral Junction: No evidence of thrombus. Normal compressibility and flow on color Doppler imaging.  Profunda Femoral Vein: No evidence of thrombus. Normal compressibility and flow on color Doppler imaging.  Femoral Vein: No evidence of thrombus. Normal compressibility, respiratory phasicity and response to augmentation.  Popliteal Vein: No evidence of thrombus. Normal compressibility, respiratory phasicity and response to augmentation.  Calf Veins: No evidence of thrombus. Normal compressibility and flow on color Doppler imaging.  Superficial Great Saphenous Vein: No evidence of thrombus. Normal compressibility and flow on color Doppler imaging.  Venous Reflux:  None.  Other Findings:  None.  IMPRESSION: No evidence of deep venous thrombosis.   Electronically Signed   By: Malachy MoanHeath  McCullough M.D.   On: 12/31/2013 15:09   [place] Arterial Seg Single  12/31/2013   CLINICAL DATA:  Left leg pain, swelling and numbness. History of aortobifemoral graft. History of lower extremity grafts.  EXAM: NONINVASIVE PHYSIOLOGIC VASCULAR STUDY OF BILATERAL LOWER EXTREMITIES  TECHNIQUE: Evaluation of both lower extremities were performed at rest, including calculation of ankle-brachial indices with single level Doppler, pressure and pulse volume recording.  COMPARISON:  11/24/2011  FINDINGS: Right ABI: The right ankle arteries are not compressible and unable to obtain ankle-brachial indices.  Left ABI:  0.88  Right Lower Extremity: Irregular  Doppler waveforms throughout the right ankle.  Left Lower Extremity: Irregular Doppler waveforms throughout the left ankle. The ABI in the posterior tibial artery is 0.88 and 0.15 from the dorsalis pedis artery.  IMPRESSION: Right ankle-brachial index could not be obtained due to non compressible vessels. Irregular waveforms in the right ankle are similar to the previous examination. Previously, the right ABI was 0.56 and suspect severe peripheral vascular disease in the right lower extremity.  Left ABI is 0.88. This could be an over-estimation because it was previously 0.38. The patient may have had revascularization involving the posterior tibial artery to account for the improved ABI.   Electronically Signed   By: Richarda Overlie M.D.   On: 12/31/2013 15:11   Dg Ang/ext/uni/or Left  01/03/2014   CLINICAL DATA:  Occluded left limb of the aortofemoral bypass graft and peripheral vascular disease.  EXAM: LEFT ANG/EXT/UNI/ OR  COMPARISON:  None.  FINDINGS: Intraoperative image was obtained which does not demonstrate any contrast in native arteries from the level of the knee joint to just above the ankle. Some vascular calcifications are seen in the proximal anterior tibial artery and throughout most of the peroneal artery. Clips are present in the lower leg.  IMPRESSION: Nonvisualization of tibial arteries or the popliteal artery by intraoperative arteriography.   Electronically Signed   By: Irish Lack M.D.   On: 01/03/2014 14:18    Discharge Exam: Filed Vitals:   01/06/14 1003  BP: 128/63  Pulse: 70  Temp:   Resp:    Filed Vitals:   01/05/14 1425 01/05/14 2010 01/06/14 0615 01/06/14 1003  BP: 129/56 121/75 138/60 128/63  Pulse: 70 76 73 70  Temp: 97.1 F (36.2 C) 99 F (37.2 C) 99.7 F (37.6 C)   TempSrc: Oral Oral Oral   Resp: 19 18 18    Height:      Weight:   95.074 kg (209 lb 9.6 oz)   SpO2: 95% 96% 95%     General: Pt is alert, follows commands appropriately, not in acute  distress Cardiovascular: Regular rate and rhythm, no rubs, no gallops Respiratory: Clear to auscultation bilaterally, no wheezing, no crackles, no rhonchi Abdominal: Soft, non tender, non distended, bowel sounds +, no guarding Extremities:trace edema in LE, no cyanosis, pulses palpable bilaterally DP and PT Neuro: Grossly nonfocal  Discharge Instructions  Discharge Instructions    Call MD for:  redness, tenderness, or signs of infection (pain, swelling, bleeding, redness, odor or green/yellow discharge around incision site)    Complete by:  As directed      Call MD for:  severe or increased pain, loss or decreased feeling  in affected limb(s)    Complete by:  As directed      Call MD for:  temperature >100.5    Complete by:  As directed      Diet - low sodium heart healthy    Complete by:  As directed      Discharge wound care:    Complete by:  As directed   Wash the groin wound with soap and water daily and pat dry. (No tub bath-only shower)  Then put a dry gauze or washcloth there to keep this area dry daily and as needed.  Do not use Vaseline or neosporin on your incisions.  Only use soap and water on your  incisions and then protect and keep dry.  PLEASE KEEP GROIN INCISIONS AS DRY AS POSSIBLE TO HELP PREVENT INFECTION!!     Driving Restrictions    Complete by:  As directed   No driving for 2 weeks     Increase activity slowly    Complete by:  As directed      Lifting restrictions    Complete by:  As directed   No lifting for 4 weeks     Resume previous diet    Complete by:  As directed             Medication List    STOP taking these medications        lisinopril-hydrochlorothiazide 20-12.5 MG per tablet  Commonly known as:  PRINZIDE,ZESTORETIC     metFORMIN 1000 MG tablet  Commonly known as:  GLUCOPHAGE      TAKE these medications        acetaminophen 500 MG tablet  Commonly known as:  TYLENOL  Take 2 tablets (1,000 mg total) by mouth every 6 (six) hours as  needed. Pain.     albuterol 108 (90 BASE) MCG/ACT inhaler  Commonly known as:  PROVENTIL HFA;VENTOLIN HFA  Inhale 2 puffs into the lungs every 4 (four) hours as needed.     aspirin 81 MG tablet  Take 1 tablet (81 mg total) by mouth daily.     BAYER CONTOUR NEXT TEST test strip  Generic drug:  glucose blood  TEST THREE TIMES A DAY     gabapentin 300 MG capsule  Commonly known as:  NEURONTIN  Take 1 capsule (300 mg total) by mouth 3 (three) times daily.     Insulin Detemir 100 UNIT/ML Pen  Commonly known as:  LEVEMIR FLEXTOUCH  Inject 45 Units into the skin daily at 10 pm.     insulin lispro 100 UNIT/ML KiwkPen  Commonly known as:  HUMALOG  Inject 12 Units into the skin 3 (three) times daily with meals.     metoprolol tartrate 25 MG tablet  Commonly known as:  LOPRESSOR  Take 1 tablet (25 mg total) by mouth 2 (two) times daily.     oxyCODONE 5 MG immediate release tablet  Commonly known as:  ROXICODONE  Take 1 tablet (5 mg total) by mouth every 6 (six) hours as needed.     simvastatin 10 MG tablet  Commonly known as:  ZOCOR  Take 1 tablet (10 mg total) by mouth at bedtime.     triamcinolone cream 0.1 %  Commonly known as:  KENALOG  Apply 1 application topically 2 (two) times daily. Apply to leg           Follow-up Information    Follow up with DICKSON,CHRISTOPHER S, MD In 2 weeks.   Specialty:  Vascular Surgery   Why:  Office will call you to arrange your appt (sent)   Contact information:   [address] [place] [address] [phone]       Follow up with Milinda Antis, MD.   Specialty:  Family Medicine   Contact information:   [address] [place] [address] [phone]        The results of significant diagnostics from this hospitalization (including imaging, microbiology, ancillary and laboratory) are listed below for reference.     Microbiology: Recent Results (from the past 240 hour(s))  Surgical pcr screen     Status: None    Collection Time: 01/02/14  9:34 PM  Result  Value Ref Range Status   MRSA, PCR NEGATIVE NEGATIVE Final   Staphylococcus aureus NEGATIVE NEGATIVE Final    Comment:        The Xpert SA Assay (FDA approved for NASAL specimens in patients over 421 years of age), is one component of a comprehensive surveillance program.  Test performance has been validated by [facility] for patients greater than or equal to 840 year old. It is not intended to diagnose infection nor to guide or monitor treatment.      Labs: Basic Metabolic Panel:  Recent Labs Lab 01/01/14 0533  01/03/14 0430 01/03/14 1153 01/03/14 2200 01/04/14 0420 01/05/14 0439 01/06/14 0353  NA 143  --  140 142  --  138 135* 131*  K 4.1  --  4.9 3.4*  --  4.6 4.4 4.5  CL 105  --  101  --   --  100 97 97  CO2 25  --  25  --   --  23 20 19   GLUCOSE 131*  --  278* 125*  --  195* 138* 148*  BUN 21  --  16  --   --  18 25* 30*  CREATININE 1.24  < > 1.11  --  1.31 1.30 1.75* 1.71*  CALCIUM 9.7  --  9.9  --   --  8.5 8.3* 8.1*  < > = values in this interval not displayed. Liver Function Tests:  Recent Labs Lab 01/01/14 0533 01/03/14 0430 01/04/14 0420  AST 19 21 56*  ALT 16 16 19   ALKPHOS 54 51 39  BILITOT 0.7 0.5 0.7  PROT 6.9 7.2 5.8*  ALBUMIN 3.4* 3.6 2.9*   CBC:  Recent Labs Lab 12/31/13 1339  01/03/14 0430 01/03/14 1153 01/03/14 2200 01/04/14 0420 01/05/14 0439 01/06/14 0353  WBC 7.6  < > 7.3  --  8.1 8.3 9.7 8.6  NEUTROABS 5.7  --   --   --   --   --   --   --   HGB 12.7*  < > 11.3* 9.5* 9.3* 8.7* 8.8* 8.4*  HCT 38.7*  < > 35.2* 28.0* 28.8* 27.0* 28.0* 26.1*  MCV 83.2  < > 83.4  --  86.0 85.4 84.3 86.1  PLT 207  < > 245  --  188 182 162 156  < > = values in this interval not displayed. Cardiac Enzymes:  Recent Labs Lab 12/31/13 2051  CKTOTAL 214  TROPONINI <0.30   CBG:  Recent Labs Lab 01/05/14 1142 01/05/14 1622 01/05/14 2149 01/06/14 0024 01/06/14 0617  GLUCAP 225* 163* 160* 189*  161*   SIGNED: Time coordinating discharge: Over 30 minutes  MAGICK-Abdulkarim Eberlin, MD  [facility] 01/06/2014, 10:50 AM Pager [phone]  If 7PM-7AM, please contact night-coverage [email or web address] Password [email or web address]

## 2014-01-06 NOTE — Progress Notes (Signed)
   VASCULAR SURGERY ASSESSMENT & PLAN:  * 3 Days Post-Op s/p: Thrombectomy of both limbs of the aortofemoral bypass graft, bilateral femoral endarterectomies, and interposition Dacron graft from the left limb of the aortofemoral graft to the left deep femoral artery.   *  Both feet adequately perfused  * Continue PTx  * To SNF when bed available to continue PTx.  SUBJECTIVE: No complaints.  PHYSICAL EXAM: Filed Vitals:   01/05/14 1035 01/05/14 1425 01/05/14 2010 01/06/14 0615  BP: 132/59 129/56 121/75 138/60  Pulse: 70 70 76 73  Temp:  97.1 F (36.2 C) 99 F (37.2 C) 99.7 F (37.6 C)  TempSrc:  Oral Oral Oral  Resp:  19 18 18   Height:      Weight:    209 lb 9.6 oz (95.074 kg)  SpO2:  95% 96% 95%   Both feet are warm Groin incisions look fine without erythema or drainage  LABS: Lab Results  Component Value Date   WBC 8.6 01/06/2014   HGB 8.4* 01/06/2014   HCT 26.1* 01/06/2014   MCV 86.1 01/06/2014   PLT 156 01/06/2014   Lab Results  Component Value Date   CREATININE 1.71* 01/06/2014   Lab Results  Component Value Date   INR 1.02 01/03/2014   CBG (last 3)   Recent Labs  01/05/14 2149 01/06/14 0024 01/06/14 0617  GLUCAP 160* 189* 161*    Principal Problem:   Lower limb ischemia Active Problems:   Diabetic neuropathy   Essential hypertension   Arteriosclerotic cardiovascular disease (ASCVD)   Cerebrovascular disease   Peripheral vascular disease   Sick sinus syndrome   Obstructive sleep apnea   Cari CarawayChris Dickson Beeper: [phone] 01/06/2014

## 2014-01-07 ENCOUNTER — Non-Acute Institutional Stay (SKILLED_NURSING_FACILITY): Payer: Medicare HMO | Admitting: Internal Medicine

## 2014-01-07 ENCOUNTER — Encounter (HOSPITAL_COMMUNITY): Payer: Self-pay | Admitting: Vascular Surgery

## 2014-01-07 ENCOUNTER — Inpatient Hospital Stay
Admission: RE | Admit: 2014-01-07 | Discharge: 2014-02-01 | Disposition: A | Discharge status: Home / Self Care | Payer: Medicare HMO | Source: Ambulatory Visit | Attending: Internal Medicine | Admitting: Internal Medicine

## 2014-01-07 ENCOUNTER — Other Ambulatory Visit: Payer: Self-pay

## 2014-01-07 DIAGNOSIS — E1159 Type 2 diabetes mellitus with other circulatory complications: Secondary | ICD-10-CM

## 2014-01-07 DIAGNOSIS — I10 Essential (primary) hypertension: Secondary | ICD-10-CM

## 2014-01-07 DIAGNOSIS — I70209 Unspecified atherosclerosis of native arteries of extremities, unspecified extremity: Secondary | ICD-10-CM

## 2014-01-07 DIAGNOSIS — I2510 Atherosclerotic heart disease of native coronary artery without angina pectoris: Secondary | ICD-10-CM

## 2014-01-07 DIAGNOSIS — E1151 Type 2 diabetes mellitus with diabetic peripheral angiopathy without gangrene: Secondary | ICD-10-CM

## 2014-01-07 DIAGNOSIS — E0841 Diabetes mellitus due to underlying condition with diabetic mononeuropathy: Secondary | ICD-10-CM

## 2014-01-07 LAB — CBC
HCT: 23.8 % — ABNORMAL LOW (ref 39.0–52.0)
Hemoglobin: 7.8 g/dL — ABNORMAL LOW (ref 13.0–17.0)
MCH: 28.3 pg (ref 26.0–34.0)
MCHC: 32.8 g/dL (ref 30.0–36.0)
MCV: 86.2 fL (ref 78.0–100.0)
Platelets: 212 10*3/uL (ref 150–400)
RBC: 2.76 MIL/uL — ABNORMAL LOW (ref 4.22–5.81)
RDW: 12.9 % (ref 11.5–15.5)
WBC: 7 10*3/uL (ref 4.0–10.5)

## 2014-01-07 LAB — GLUCOSE, CAPILLARY
GLUCOSE-CAPILLARY: 193 mg/dL — AB (ref 70–99)
Glucose-Capillary: 109 mg/dL — ABNORMAL HIGH (ref 70–99)
Glucose-Capillary: 130 mg/dL — ABNORMAL HIGH (ref 70–99)
Glucose-Capillary: 72 mg/dL (ref 70–99)

## 2014-01-07 MED ORDER — OXYCODONE HCL 5 MG PO TABS: 5.0000 mg | ORAL_TABLET | Freq: Four times a day (QID) | ORAL | Status: DC | PRN

## 2014-01-07 MED ORDER — ENOXAPARIN SODIUM 30 MG/0.3ML ~~LOC~~ SOLN
30.0000 mg | SUBCUTANEOUS | Status: DC
Start: 2014-01-07 — End: 2014-01-07
  Administered 2014-01-07: 30 mg via SUBCUTANEOUS
  Filled 2014-01-07: qty 0.3

## 2014-01-07 NOTE — Progress Notes (Signed)
Central line removed per order. Occlusive dressing and pressure applied. Pt instructed of 30 minute bedrest. Verbalized understanding. Will continue to monitor pt closely. 

## 2014-01-07 NOTE — Progress Notes (Signed)
Patient D/C to SNF.  Transported by [facility]. 

## 2014-01-07 NOTE — Clinical Social Work Note (Signed)
Patient to be d/c'ed today to [facility].  Patient and family agreeable to plans will transport via ems RN to call report.  Patient's sister at bedside and pleased that [facility].  Windell MouldingEric Cherrell Maybee, MSW, Theresia MajorsLCSWA [phone]

## 2014-01-07 NOTE — Progress Notes (Addendum)
  Progress Note    01/07/2014 6:58 AM 4 Days Post-Op  Subjective:  No complaints  Tm 99 now afebrile HR 60's-70's 120's-130's systolic 96% RA  Filed Vitals:   01/07/14 0454  BP: 138/67  Pulse: 61  Temp: 98.4 F (36.9 C)  Resp: 17    Physical Exam: Lungs:  Non labored Incisions:  C/d/i Extremities:  Bilateral feet are warm and well perfused.   CBC    Component Value Date/Time   WBC 7.0 01/07/2014 0518   RBC 2.76* 01/07/2014 0518   HGB 7.8* 01/07/2014 0518   HCT 23.8* 01/07/2014 0518   PLT 212 01/07/2014 0518   MCV 86.2 01/07/2014 0518   MCH 28.3 01/07/2014 0518   MCHC 32.8 01/07/2014 0518   RDW 12.9 01/07/2014 0518   LYMPHSABS 1.3 12/31/2013 1339   MONOABS 0.4 12/31/2013 1339   EOSABS 0.1 12/31/2013 1339   BASOSABS 0.0 12/31/2013 1339    BMET    Component Value Date/Time   NA 131* 01/06/2014 0353   K 4.5 01/06/2014 0353   CL 97 01/06/2014 0353   CO2 19 01/06/2014 0353   GLUCOSE 148* 01/06/2014 0353   BUN 30* 01/06/2014 0353   CREATININE 1.71* 01/06/2014 0353   CREATININE 1.26 12/03/2013 0848   CALCIUM 8.1* 01/06/2014 0353   GFRNONAA 41* 01/06/2014 0353   GFRNONAA 59* 08/28/2013 1206   GFRAA 47* 01/06/2014 0353   GFRAA 68 08/28/2013 1206    INR    Component Value Date/Time   INR 1.02 01/03/2014 0430     Intake/Output Summary (Last 24 hours) at 01/07/14 0658 Last data filed at 01/07/14 [id number]  Gross per 24 hour  Intake    960 ml  Output   1500 ml  Net   -540 ml     Assessment:  63 y.o. male is s/p:  1. Thrombectomy of left limb of aortofemoral bypass graft 2. Extensive endarterectomy of the left common femoral artery and deep femoral artery 3. Bypass from the left limb of the aortofemoral bypass graft to the deep femoral artery using 8 mm Dacron graft 4. Intraoperative arteriogram of the left lower extremity 5. Thrombectomy of the right limb of the aortofemoral bypass graft 6. Endarterectomy of the right common femoral artery 7.  Dacron patch angioplasty of distal right limb of aortofemoral bypass graft  4 Days Post-Op  Plan: -pt doing well this am from surgical standpoint -acute blood loss anemia with hgb of 7.8 this am-will defer decision of transfusion to primary team.  Pt's BP is tolerating. -DVT prophylaxis:  Lovenox-will change to 30mg  daily due to renal function   Doreatha MassedSamantha Rhyne, PA-C [facility] [phone] 01/07/2014 6:58 AM  Agree with above. Plan transfer to skilled nursing facility when bed available.  Waverly Ferrarihristopher Dickson, MD, FACS Beeper [phone] 01/07/2014

## 2014-01-07 NOTE — Telephone Encounter (Signed)
RX faxed to [facility] @ [phone]. Phone number [phone]  

## 2014-01-07 NOTE — Progress Notes (Signed)
Patient ID: [name], male   DOB: 10/07/1950, 63 y.o.   MRN: [id number]   This is an acute visit.  Level of care skilled.  Facility [facility].  Chief complaint acute visit status post hospitalization for lower limb ischemia status post thrombectomy of left limb of aortobifemoral bypass graft-bypass from the left limb of the atrial femoral bypass graft to the deep femoral artery-also thrombectomy of the right limb of the aortofemoral bypass graft-and endarterectomy of the right common femoral artery.  History of present illness.  Patient is a pleasant 63 year old male who had the above-stated procedure secondary to significant left leg pain with ischemia.  This is complicated with a history of diabetes that has been uncontrolled with hemoglobin A1c of 11.1 apparently he is on Levemir his Glucophage was held until his renal function stabilized-appears his creatinine had risen up to about 1.75 couple days ago will need to recheck this.  Patient does have a history of coronary artery disease status post pacemaker this was stable during his hospitalization.  He also has a history of a silver 8 hypertension his ACE inhibitor was held in the hospital as well as hydrochlorothiazide because of his renal issues he is on a clonidine patch and Lopressor.  Patient also has a history of depression psychiatric and social workers were consulted recommendation for an outpatient psych evaluation-he was denying any suicidal or homicidal idealization.  Currently patient appears stable blood pressures 148/68 will monitor this he has no acute complaints appears to be in good spirits family is in the room with him  Previous medical history.  Lower limb ischemia status post procedures as noted above.  Diabetic neuropathy.  Hypertension.  Arteriosclerotic cardiovascular disease.  Cerebral vascular disease.  Peripheral vascular disease.  6 sinus syndrome status post pacemaker.  Obstructive  sleep apnea.  Family medical social history as been reviewed per discharge note on 01/06/2014.  Medications.  Tylenol 1000 mg every 6 hours when necessary.  Albuterol inhaler 2 puffs every 4 hours when necessary.  Aspirin 81 mg daily.  Neurontin 300 mg 3 times a day.  Levemir flex pen 45 units daily at bedtime.  Humalog insulin 12 units in the skin 3 times daily with meals if CBG greater than 150.  Lopressor 25 mg twice a day.  Oxycodone 5 mg every 6 hours when necessary pain.  Simvastatin milligrams daily at bedtime.  Clonidine patch 0.1 mg change Q weekly  Review of systems.  In general does not complain of any fever or chills.  Skin does not complain of any rashes or itching surgical sites apparently have been stable.  Head ears eyes nose mouth and throat-does not complain of any visual changes or sore throat.  Respiratory does not complain of cough or shortness of breath.  Cardiac does not complain of chest pain has significant vascular disease as noted above.  GI does not complain of abdominal discomfort has a chronically protuberant abdomen does not complain of constipation nausea or vomiting.  GU is not complaining of dysuria.  Musculoskeletal has some weakness but does not complain of joint pain currently.  Neurologic history of significant neuropathy but does not complain of any dizziness or headaches or syncopal-type feelings.  Psych history of depression does not complain overtly of depression or anxiety currently.  Physical exam.  Temperature is 98.1 pulse 60 respirations 18 blood pressure 148/68.  In general this is a pleasant middle-age male in no distress comfortably in bed.  His skin is warm and  dry he does have site on his lower   leftneck covered currently with dressing  Bilateral groin area also areas are covered with dry dressing-he does have well-healed surgical scars lower legs bilaterally.  Eyes are reactive to light sclerae and  conjunctivae are clear visual acuity appears grossly intact.  Oropharynx clear mucous membranes moist.  Chest is clear to auscultation there is no labored breathing.-Somewhat shallow air entry  Heart is regular rate and rhythm with occasional irregular beats he has reduced pedal pulses bilaterally and has mild lower extremity edema I suspect this is baseline.  Abdomen is significantly protuberant soft nontender with positive bowel sounds.  GU-he does have an enlarged scrotum per patient this is baseline as well it is nontender and non-erythematous  Musculoskeletal does move all extremities 4 limited exam since patient is in bed grip strength appears to be intact and strong bilaterally-I did not note any deformities.  Neurologic appears to be grossly intact speech is clear I did not see any overt lateralizing findings-he does have a history of neuropathy touch sensation appeared to be intact however of his lower extremities  Psych he appears grossly alert and oriented pleasant and appropriate.  Labs.  01/06/2014.  Sodium 131 potassium 4.5 BUN 30 creatinine 1.71 it appears baseline creatinine is in the lower ones per chart review  WBC 8.6 hemoglobin 8.4 platelets 156.  .  01/04/2014.  AST 56 ALT 19 alkaline phosphatase phosphatase 39 bilirubin 0.7-albumin 2.9.   Assessment and plan.  #1 history of lower limb ischemia past grafting with history of arteriosclerotic disease-will need PT and OT at this point pain appears to be controlled --he does have oxycodone as needed as well as Tylenol-.  He does continue on aspirin as well as a statin.  #2 history of diabetes apparently poorly controlled previously-he is on Levemir as well as Humalog before meals-at this point will monitor CBGs.  #3 history of diabetic neuropathy is on Neurontin apparently this is significant will monitor this as well.  #4-history of hypertension again place and hydrochlorothiazide are held secondary to  renal issues continues on Toprol all as well as a clonidine patch at this point will monitor area  #5-history of renal insufficiency Will update a metabolic panel tomorrow to keep an eye on his creatinine on discharge appears creatinine was around 1.7.  #6-history of anemia --it appears hemoglobin dropped into the low 8 status post procedure-we'll have to keep an eye on this will order a CBC for tomorrow as well.   #7-history depression-at this point patient appears to be good spirits-again discharge summary recommends a possible psychiatric follow-up.  8-history COPD-he does continue on albuterol inhaler  [id number]-of note greater than 35 minutes spent assessing patient-reviewing his medical records-and coordinating and formulating a plan of care for numerous diagnoses-of note greater than 50% of time spent coordinating plan of care.

## 2014-01-07 NOTE — Progress Notes (Signed)
[facility] PROGRESS NOTE  Assessment/Plan: Lower limb ischemia Arteriosclerotic cardiovascular disease (ASCVD) Cerebrovascular disease Peripheral vascular disease Sick sinus syndrome Obstructive sleep apnea Diabetic neuropathy/DM II Essential hypertension AKI  Pt medically stable for d/c no changes.   Consultants:  VVS  Procedures:  vascular  Antibiotics:  none  HPI/Subjective: none  Objective: Filed Vitals:   01/06/14 1449 01/06/14 2002 01/07/14 0454 01/07/14 1104  BP: 122/56 128/80 138/67 117/67  Pulse: 62 70 61 76  Temp: 98.9 F (37.2 C) 99 F (37.2 C) 98.4 F (36.9 C)   TempSrc: Oral Oral Oral   Resp: 18 19 17    Height:      Weight:   93.396 kg (205 lb 14.4 oz)   SpO2: 100% 96% 96%     Intake/Output Summary (Last 24 hours) at 01/07/14 1436 Last data filed at 01/07/14 0641  Gross per 24 hour  Intake    360 ml  Output   1500 ml  Net  -1140 ml   Filed Weights   01/05/14 0503 01/06/14 0615 01/07/14 0454  Weight: 93.622 kg (206 lb 6.4 oz) 95.074 kg (209 lb 9.6 oz) 93.396 kg (205 lb 14.4 oz)    Exam:  General: Alert, awake, oriented x3, in no acute distress.  HEENT: No bruits, no goiter.  Heart: Regular rate and rhythm, without murmurs, rubs, gallops.  Lungs: Good air movement, clear Abdomen: Soft, nontender, nondistended, positive bowel sounds.     Data Reviewed: Basic Metabolic Panel:  Recent Labs Lab 01/01/14 0533  01/03/14 0430 01/03/14 1153 01/03/14 2200 01/04/14 0420 01/05/14 0439 01/06/14 0353  NA 143  --  140 142  --  138 135* 131*  K 4.1  --  4.9 3.4*  --  4.6 4.4 4.5  CL 105  --  101  --   --  100 97 97  CO2 25  --  25  --   --  23 20 19   GLUCOSE 131*  --  278* 125*  --  195* 138* 148*  BUN 21  --  16  --   --  18 25* 30*  CREATININE 1.24  < > 1.11  --  1.31 1.30 1.75* 1.71*  CALCIUM 9.7  --  9.9  --   --  8.5 8.3* 8.1*  < > = values in this interval not displayed. Liver Function Tests:  Recent Labs Lab  01/01/14 0533 01/03/14 0430 01/04/14 0420  AST 19 21 56*  ALT 16 16 19   ALKPHOS 54 51 39  BILITOT 0.7 0.5 0.7  PROT 6.9 7.2 5.8*  ALBUMIN 3.4* 3.6 2.9*   No results for input(s): LIPASE, AMYLASE in the last 168 hours. No results for input(s): AMMONIA in the last 168 hours. CBC:  Recent Labs Lab 01/03/14 2200 01/04/14 0420 01/05/14 0439 01/06/14 0353 01/07/14 0518  WBC 8.1 8.3 9.7 8.6 7.0  HGB 9.3* 8.7* 8.8* 8.4* 7.8*  HCT 28.8* 27.0* 28.0* 26.1* 23.8*  MCV 86.0 85.4 84.3 86.1 86.2  PLT 188 182 162 156 212   Cardiac Enzymes:  Recent Labs Lab 12/31/13 2051  CKTOTAL 214  TROPONINI <0.30   BNP (last 3 results) No results for input(s): PROBNP in the last 8760 hours. CBG:  Recent Labs Lab 01/06/14 1150 01/06/14 1837 01/07/14 0004 01/07/14 0623 01/07/14 1201  GLUCAP 201* 75 130* 72 193*    Recent Results (from the past 240 hour(s))  Surgical pcr screen     Status: None   Collection Time: 01/02/14  9:34 PM  Result Value Ref Range Status   MRSA, PCR NEGATIVE NEGATIVE Final   Staphylococcus aureus NEGATIVE NEGATIVE Final    Comment:        The Xpert SA Assay (FDA approved for NASAL specimens in patients over 64 years of age), is one component of a comprehensive surveillance program.  Test performance has been validated by [facility] for patients greater than or equal to 64 year old. It is not intended to diagnose infection nor to guide or monitor treatment.      Studies: No results found.  Scheduled Meds: . aspirin EC  81 mg Oral Daily  . cloNIDine  0.1 mg Transdermal Weekly  . enoxaparin (LOVENOX) injection  30 mg Subcutaneous Q24H  . gabapentin  300 mg Oral TID  . insulin aspart  0-15 Units Subcutaneous Q6H  . insulin aspart  10 Units Subcutaneous TID WC  . insulin detemir  45 Units Subcutaneous QHS  . metoprolol tartrate  25 mg Oral BID  . pantoprazole  40 mg Oral Daily  . polyethylene glycol  17 g Oral Daily  . senna-docusate  1 tablet  Oral BID  . simvastatin  10 mg Oral QHS  . sodium chloride  3 mL Intravenous Q12H   Continuous Infusions: . sodium chloride 75 mL/hr at 01/05/14 1724     FELIZ Rosine BeatTIZ, Sariya Trickey  [facility] Pager [phone]. If 8PM-8AM, please contact night-coverage at [email or web address], password [email or web address] 01/07/2014, 2:36 PM  LOS: 7 days

## 2014-01-08 ENCOUNTER — Encounter: Payer: Self-pay | Admitting: Internal Medicine

## 2014-01-08 ENCOUNTER — Non-Acute Institutional Stay (SKILLED_NURSING_FACILITY): Payer: Medicare HMO | Admitting: Internal Medicine

## 2014-01-08 DIAGNOSIS — R7989 Other specified abnormal findings of blood chemistry: Secondary | ICD-10-CM

## 2014-01-08 DIAGNOSIS — D649 Anemia, unspecified: Secondary | ICD-10-CM

## 2014-01-08 DIAGNOSIS — E1159 Type 2 diabetes mellitus with other circulatory complications: Secondary | ICD-10-CM

## 2014-01-08 DIAGNOSIS — I70209 Unspecified atherosclerosis of native arteries of extremities, unspecified extremity: Secondary | ICD-10-CM

## 2014-01-08 DIAGNOSIS — E1151 Type 2 diabetes mellitus with diabetic peripheral angiopathy without gangrene: Secondary | ICD-10-CM

## 2014-01-08 DIAGNOSIS — I10 Essential (primary) hypertension: Secondary | ICD-10-CM

## 2014-01-08 DIAGNOSIS — R945 Abnormal results of liver function studies: Secondary | ICD-10-CM

## 2014-01-08 LAB — GLUCOSE, CAPILLARY
GLUCOSE-CAPILLARY: 95 mg/dL (ref 70–99)
Glucose-Capillary: 167 mg/dL — ABNORMAL HIGH (ref 70–99)
Glucose-Capillary: 220 mg/dL — ABNORMAL HIGH (ref 70–99)
Glucose-Capillary: 166 mg/dL — ABNORMAL HIGH (ref 70–99)

## 2014-01-08 NOTE — Progress Notes (Signed)
Patient ID: Collin LeitzWilliam F [name], male   DOB: 09/26/1950, 63 y.o.   MRN: [id number]    This is an acute visit.  Level care skilled.  [facility]  Chief complaint.  Acute visit secondary to abnormal liver function tests.  History of present illness.  Patient is a pleasant 63 year old male status post femoral-popliteal bypass here for rehabilitation.  Her routine labs are ordered for today which came back fairly stable except for elevated liver function tests-he  Of note the alkaline phosphatase was 208 AST 384 ALT 507.  Bilirubin was within normal limits at 0.5.  I have reviewed his medicines he is on low dose simvastatin at 10 mg a day as been on this for some time-also has when necessary Tylenol.  I did review previous labs in the hospital and 5 days ago his liver function tests were within normal limits except AST was in the 50s.  He appears to be asymptomatic is not complaining of any nausea vomiting or abdominal discomfort he has a chronically protuberant abdomen there is some question whether this may have gotten bigger over the last several weeks although this apparently is baseline with the size of his abdomen during his recent hospitalization--per discussion with patient and his family at bedside.  His vital signs continued to be stable--patient denies having any transfusion in the hospital and per review of Epic records I do not see any evidence of a transfusion-.  Patient denies any significant GI history and again per review of Epic records I could not really find any recent acute issues. Or GI studies  Family medical social history as been reviewed per discharge note on 01/07/2014.--Numerous previous progress notes including Primary care provider note 10/30/2013  [facility].  Review of systems.  General no complaints of fever chills  -Surgical sites appear to be benign appearing in the neck as well as inguinal area.  Head ears eyes nose  mouth and throat no complaints of visual changes or sore throat.  Respirator does not complain of cough or shortness of breath.  Cardiac has a pacemaker but does not complain of chest pain as some mild lower extremity edema this appears apparently to be relatively baseline.  GI-other than protuberant abdomen does not complain of any abdominal discomfort apparently had a bowel movement this morning does not complain of any nausea vomiting or abdominal discomfort.  Neurologic is not complaining of any headache or dizziness  Musculoskeletal-at this point does not appear to be in acute discomfor--has when necessary Tylenol he does have oxycodone but has not asked for this.  Psych does not complain of depression or anxiety.  Physical exam.  Temperature is 97.9 pulse 69 respirations 20 blood pressure 132/68.  In general this is a pleasant male in no distress sitting comfortably in his chair.  His skin is warm and dry.--I do note surgical sites bilateral inguinal areas appear to be benign looking without any drainage or bleeding or sign of infection--the same with his neck area  Eyes sclera and conjunctivae are clear visual acuity appears grossly intact.  Oropharynx clear mucous membranes moist.  Chest is clear to auscultation there is no labored breathing.  Heart is regular rate and rhythm with somewhat distant heart sounds she appears to have some some chronic lower extremity edema.  Abdomen is significantly protuberant it is soft nontender there are positive bowel sounds slightly hypoactive.   Musculoskeletal-she moves all extremities 4 I do not note any deformities he has surgical  scars lower legs bilaterally   Neurologic is grossly intact speech is clear I could not appreciate any lateralizing findings.  Psych he appears to be alert and oriented pleasant and appropriate labs.  01/08/2014.  WBC 6.7 hemoglobin 8.2 platelets 259.  Sodium 139 potassium 3.8 BUN 16 creatinine  1.21.  Alkaline phosphatase 208-AST 384-ALT 507.  Albumin 2.5-bilirubin was within normal limits at 0.5.    Assessment and plan.  #1-elevated liver function tests-this appears to be a new abnormality-clinically he appears quite stable and asymptomatic-we'll discontinue the statin-as well as when necessary Tylenol-I do note he has oxycodone ordered for pain but does not really ask for this-will discontinue this and start tramadol 50 mg every 6 hours when necessary if necessary-again he appears to be comfortable currently Patient will have to be monitored closely for  abdominal discomfort nausea or vomiting or change in status-however he appears to be essentially  asymptomatic currently  Also will reorder a metabolic panel with liver function tests first laboratory day next week  -this was discussed with Dr. Leanord Hawkingobson via phone who participated in the plan of care  #2-hypertension apparently patient has a significant history of this-recent blood pressures appear to be fairly satisfactory ACE inhibitor and hydrochlorothiazide were held in the hospital secondary to renal insufficiency he continues on metoprolol as well as a clonidine patch at this point will monitor.  #3-history renal insufficiency-this appears to have stabilized versus hospital were creatinine appeared to run in the higher ones at times-again will update this first laboratory day next week--he currently appears to be trending down at 1.2.  #4-history diabetes type 2 so far minimal readings blood sugar this morning was 95 at noon was 167 at this point will monitor he continues on Levemir as well as Humalog with meals if blood sugars greater than 150.  #5-anemia-hemoglobin appears to be trending up slightly status post postop again this will have to be updated next week as well --hemoglobin was 7.8 yesterday on discharge currently 8.2  [id number]-of note greater than 35 minutes spent assessing patient-discussing his status with  nursing staff as well as with his family in the room-review of his medical record as stated above-and coordinating and formulating a plan of care-of note greater than 50% of time spent coordinating plan of care

## 2014-01-08 NOTE — Care Management Note (Signed)
    Page 1 of 1   01/08/2014     2:52:20 PM CARE MANAGEMENT NOTE 01/08/2014  Patient:  [name],[name]   Account Number:  [id number]  Date Initiated:  01/06/2014  Documentation initiated by:  Donn PieriniWEBSTER,Dresden Lozito  Subjective/Objective Assessment:   Pt admitted with lower limb ischemia     Action/Plan:   PTA pt lived at home- plan for SNF   Anticipated DC Date:  01/06/2014   Anticipated DC Plan:  SKILLED NURSING FACILITY  In-house referral  Clinical Social Worker      DC Planning Services  CM consult      Choice offered to / List presented to:             Status of service:  Completed, signed off Medicare Important Message given?  YES (If response is "NO", the following Medicare IM given date fields will be blank) Date Medicare IM given:  01/06/2014 Medicare IM given by:  Donn PieriniWEBSTER,Shonia Skilling Date Additional Medicare IM given:   Additional Medicare IM given by:    Discharge Disposition:  SKILLED NURSING FACILITY  Per UR Regulation:  Reviewed for med. necessity/level of care/duration of stay  If discussed at Long Length of Stay Meetings, dates discussed:   01/07/2014    Comments:

## 2014-01-09 ENCOUNTER — Encounter: Payer: Self-pay | Admitting: Internal Medicine

## 2014-01-09 LAB — GLUCOSE, CAPILLARY
GLUCOSE-CAPILLARY: 316 mg/dL — AB (ref 70–99)
Glucose-Capillary: 160 mg/dL — ABNORMAL HIGH (ref 70–99)
Glucose-Capillary: 202 mg/dL — ABNORMAL HIGH (ref 70–99)
Glucose-Capillary: 120 mg/dL — ABNORMAL HIGH (ref 70–99)

## 2014-01-10 LAB — GLUCOSE, CAPILLARY
GLUCOSE-CAPILLARY: 126 mg/dL — AB (ref 70–99)
Glucose-Capillary: 190 mg/dL — ABNORMAL HIGH (ref 70–99)
Glucose-Capillary: 164 mg/dL — ABNORMAL HIGH (ref 70–99)
Glucose-Capillary: 241 mg/dL — ABNORMAL HIGH (ref 70–99)

## 2014-01-11 LAB — GLUCOSE, CAPILLARY
Glucose-Capillary: 107 mg/dL — ABNORMAL HIGH (ref 70–99)
Glucose-Capillary: 184 mg/dL — ABNORMAL HIGH (ref 70–99)
Glucose-Capillary: 181 mg/dL — ABNORMAL HIGH (ref 70–99)
Glucose-Capillary: 213 mg/dL — ABNORMAL HIGH (ref 70–99)

## 2014-01-12 ENCOUNTER — Non-Acute Institutional Stay (SKILLED_NURSING_FACILITY): Payer: Medicare HMO | Admitting: Internal Medicine

## 2014-01-12 DIAGNOSIS — R239 Unspecified skin changes: Secondary | ICD-10-CM

## 2014-01-12 DIAGNOSIS — R7989 Other specified abnormal findings of blood chemistry: Secondary | ICD-10-CM

## 2014-01-12 DIAGNOSIS — R945 Abnormal results of liver function studies: Secondary | ICD-10-CM

## 2014-01-12 DIAGNOSIS — I10 Essential (primary) hypertension: Secondary | ICD-10-CM

## 2014-01-12 DIAGNOSIS — L989 Disorder of the skin and subcutaneous tissue, unspecified: Secondary | ICD-10-CM

## 2014-01-12 LAB — GLUCOSE, CAPILLARY
Glucose-Capillary: 135 mg/dL — ABNORMAL HIGH (ref 70–99)
Glucose-Capillary: 248 mg/dL — ABNORMAL HIGH (ref 70–99)

## 2014-01-12 NOTE — Progress Notes (Signed)
Patient ID: Collin LeitzWilliam F Dubey, male   DOB: 09/08/1950, 63 y.o.   MRN: [id number] Patient ID: Collin LeitzWilliam F Krouse, male   DOB: 09/04/1950, 63 y.o.   MRN: [id number]    This is an acute visit.  Level care skilled.  [facility]  Chief complaint.  Acute visit secondary to superficial skin issue right inguinal surgical site-follow-up abdominal issues with elevated liver function tests-follow-up hypertension  History of present illness.  Patient is a pleasant 63 year old male status post femoral-popliteal bypass here for rehabilitation. He appears to be doing well clinically.  Nursing staff did have me look at a very superficial area right inguinal surgical incision area there is a minute amount of drainage here there is no surrounding erythema or tenderness or warmth that I can appreciate-will treat this with a topical antibiotic for now with close monitoring he does not complain of any pain with this e On another matter-he did have routine labs done last week Of note the alkaline phosphatase was 208 AST 384 ALT 507.  Bilirubin was within normal limits at 0.5.  We did discontinue his simvastatin as well as when necessary Tylenol   I did review previous labs in the hospital  his liver function tests were within normal limits except AST was in the 50s.  He heontinues to be asymptomatic is not complaining of any nausea vomiting or abdominal discomfort he has a chronically protuberant abdomen    His vital signs continued to be stable--patient denies having any transfusion in the hospital and per review of Epic records I do not see any evidence of a transfusion-.  Patient denies any significant GI history and again per  previous review of Epic records I could not really find any recent acute issues. Or GI studies  Family medical social history as been reviewed per discharge note on 01/07/2014.--And previous progress notes  [facility]  Review of  systems.  General no complaints of fever chills  -  Head ears eyes nose mouth and throat no complaints of visual changes or sore throat.  Respiratory- does not complain of cough or shortness of breath.  Cardiac has a pacemaker but does not complain of chest pain as some mild lower extremity edema this appears apparently to be relatively baseline.  GI-other than protuberant abdomen does not complain of any abdominal discomfort apparently had regular bowel movements-- does not complain of any nausea vomiting or abdominal discomfort.  Neurologic is not complaining of any headache or dizziness  Musculoskeletal-at this point does not complain of any discomfort. .  Physical exam. Temperature is 97.9 pulse 96 respirations 16 blood pressure appears somewhat elevated 170/78-1 54/86-also see recent systolics in the 130s-  In general this is a pleasant male in no distress   His skin is warm and dry.--I do note right inguinal surgical site there is again a minute amount of whitish drainage located centrally-- this is very small there is no surrounding erythema or tenderness or warmth that I can appreciate he is not complaining of any pain-   Eyes sclera and conjunctivae are clear visual acuity appears grossly intact.  Oropharynx clear mucous membranes moist.  Chest is clear to auscultation there is no labored breathing.  Heart is regular rate and rhythm with somewhat distant heart sounds he appears to have some some chronic lower extremity edema.  Abdomen is significantly protuberant it is soft nontender there are positive bowel sounds--they are active this evening.   Musculoskeletal-she moves all extremities 4  I do not note any deformities he has surgical scars lower legs bilaterally   Neurologic is grossly intact speech is clear I could not appreciate any lateralizing findings.  Psych he appears to be alert and oriented pleasant and appropriate labs.  01/08/2014.  WBC 6.7  hemoglobin 8.2 platelets 259.  Sodium 139 potassium 3.8 BUN 16 creatinine 1.21.  Alkaline phosphatase 208-AST 384-ALT 507.  Albumin 2.5-bilirubin was within normal limits at 0.5.    Assessment and plan.  #1-elevated liver function tests--this was discussed previously with Dr. Leanord Hawkingobson and updated labs have been ordered-he does not appear to be symptomatic his statin has been discontinued as well as when necessary Tylenol-he is receiving tramadol as needed for pain appears he is quite comfortable at this time      #2-hypertension apparently patient has a significant history of this-recent blood pressures appear to be s elevated-most of the time with some variability- ACE inhibitor and hydrochlorothiazide were held in the hospital secondary to renal insufficiency--apparently a contrast study have contributed to this-- he continues on metoprolol as well as a clonidine patch--per discharge recommendation to restart lisinopril / hydrochlorothiazide once renal function improves-this appears to be the case it appears patient is relatively at his baseline now-will start Zestoretic 10-12 .5 mg Qday-  will-have to monitor his renal function as well as blood pressures--clinically appears stable  #3-history renal insufficiency-this appears to have stabilized versus hospital were creatinine appeared to run in the higher ones at times-again will update this first laboratory day --he currently appears to be trending down at 1.2.  #4-history diabetes type 2--recent a.m. readings have ranged 107-190--at noon 164-248.  At 4 PM 120-220.  And at at bedtime mid 100s to lower 200s generally I see of 316 but this appears to be fairly rare-- at this point will monitor he continues on Levemir as well as Humalog with meals if blood sugars greater than 150.  #5-anemia-hemoglobin appears to be trending up slightly status post postop again this will have to be updated  as well --hemoglobin was 7.8 on hospital discharge  was 8.2 on lab done at this facility  #6-history of small right inguinal drainage? This again is very minimal-will write an order to apply antibiotic ointment twice a day and monitor this for any changes including any increased drainage or erythema or pain-if there is enough drainage to culture have advised nursing to try to obtain-- although again this is very minute at this point-  [id number]

## 2014-01-12 NOTE — Progress Notes (Signed)
Patient ID: [name], male   DOB: 05/01/1950, 63 y.o.   MRN: [id number]   Of note-I have discussed treatment of the right inguinal area with nursing-apparently they have been advised per surgery to not put any topical treatment on this-certainly will honor their wishes and we will discontinue that order  for a topical antibiotic --continue to monitor for any changes as noted previously

## 2014-01-13 LAB — GLUCOSE, CAPILLARY
GLUCOSE-CAPILLARY: 220 mg/dL — AB (ref 70–99)
GLUCOSE-CAPILLARY: 212 mg/dL — AB (ref 70–99)
Glucose-Capillary: 162 mg/dL — ABNORMAL HIGH (ref 70–99)
Glucose-Capillary: 164 mg/dL — ABNORMAL HIGH (ref 70–99)
Glucose-Capillary: 199 mg/dL — ABNORMAL HIGH (ref 70–99)

## 2014-01-14 ENCOUNTER — Non-Acute Institutional Stay (SKILLED_NURSING_FACILITY): Payer: Medicare HMO | Admitting: Internal Medicine

## 2014-01-14 DIAGNOSIS — G629 Polyneuropathy, unspecified: Secondary | ICD-10-CM

## 2014-01-14 DIAGNOSIS — E1159 Type 2 diabetes mellitus with other circulatory complications: Secondary | ICD-10-CM

## 2014-01-14 DIAGNOSIS — I10 Essential (primary) hypertension: Secondary | ICD-10-CM

## 2014-01-14 DIAGNOSIS — E1151 Type 2 diabetes mellitus with diabetic peripheral angiopathy without gangrene: Secondary | ICD-10-CM

## 2014-01-14 DIAGNOSIS — E1142 Type 2 diabetes mellitus with diabetic polyneuropathy: Secondary | ICD-10-CM

## 2014-01-14 DIAGNOSIS — I70209 Unspecified atherosclerosis of native arteries of extremities, unspecified extremity: Secondary | ICD-10-CM

## 2014-01-14 LAB — GLUCOSE, CAPILLARY
GLUCOSE-CAPILLARY: 200 mg/dL — AB (ref 70–99)
Glucose-Capillary: 176 mg/dL — ABNORMAL HIGH (ref 70–99)
Glucose-Capillary: 167 mg/dL — ABNORMAL HIGH (ref 70–99)

## 2014-01-15 LAB — GLUCOSE, CAPILLARY
GLUCOSE-CAPILLARY: 164 mg/dL — AB (ref 70–99)
GLUCOSE-CAPILLARY: 233 mg/dL — AB (ref 70–99)
GLUCOSE-CAPILLARY: 175 mg/dL — AB (ref 70–99)
Glucose-Capillary: 113 mg/dL — ABNORMAL HIGH (ref 70–99)
Glucose-Capillary: 202 mg/dL — ABNORMAL HIGH (ref 70–99)
Glucose-Capillary: 208 mg/dL — ABNORMAL HIGH (ref 70–99)

## 2014-01-16 LAB — GLUCOSE, CAPILLARY
Glucose-Capillary: 274 mg/dL — ABNORMAL HIGH (ref 70–99)
Glucose-Capillary: 122 mg/dL — ABNORMAL HIGH (ref 70–99)

## 2014-01-16 NOTE — Progress Notes (Addendum)
Patient ID: [name], male   DOB: 1950/06/08, 63 y.o.   MRN: [id number]               HISTORY & PHYSICAL  DATE:  01/14/2014    FACILITY: [facility]    LEVEL OF CARE:   SNF   CHIEF COMPLAINT:  Admission to SNF, post stay at [facility] from 12/31/2013 through 01/06/2014.    HISTORY OF PRESENT ILLNESS:  I am reviewing this man who presented with critical ischemia of the left leg.  He went to the OR on 01/03/2014, had a thrombectomy of the left limb of a previous aortofemoral bypass graft and extensive endarterectomy of the left common femoral artery and deep femoral artery bypass to the left limb of the aortofemoral bypass graft to the deep femoral artery.  He also underwent a thrombectomy of the right limb of the aortofemoral bypass graft and an endarterectomy of the right common femoral artery.  Remarkably, he tolerated all of this fairly well.    It was noted in the hospital that his hemoglobin A1c was 11.1.  His metformin and lisinopril were on hold until renal functions stabilized.    Postoperatively, he was found to have bilateral numbness of his legs.  CT scan of the head was negative, as well as x-rays of his cervical and lumbar spine.  He was felt to have diabetic neuropathy.    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    History of coronary artery disease with a pacemaker.    History of accelerated hypertension.  His blood pressure was mildly elevated.  His ACE inhibitors were held and hydrochlorothiazide was also held in the setting of a contrast study.     Depression.    Constipation.    CURRENT MEDICATIONS:  Discharge medications include:     Tylenol 1000 q.6 p.r.n.    Proventil 2 puffs q.4.    ASA 81 q.d.    Neurontin 300 three times a day.    Levemir 45 U daily.    Humalog 12 U three times daily.    Metoprolol 25 b.i.d.    Simvastatin 10 mg daily (has been discontinued due to  elevated liver function tests).    Kenalog 0.1% b.i.d. to the leg.    Of note, his lisinopril and metformin were put on hold with instructions that they could be resumed.    SOCIAL HISTORY:     HOUSING:  The patient tells me that he lives in [place] with his wife.   FUNCTIONAL STATUS:  He was independent with ADLs and IADLs.   TOBACCO USE:  Nonsmoker.    FAMILY HISTORY:     FATHER:  Coronary artery disease in his father.   SIBLINGS:  Peripheral vascular disease and diabetes in a sister and brother.    REVIEW OF SYSTEMS:   GENERAL APPEARANCE:  The patient states he feels slightly weak, but otherwise well.  He is already ambulatory.   CHEST/RESPIRATORY:  No cough.  No sputum.      CARDIAC:   No chest pain.   GI:  No abdominal pain.   MUSCULOSKELETAL:  Extremities:  States the pain in his legs is much better.   NEUROLOGICAL:    Still complaining of peripheral leg numbness and tingling.    PHYSICAL EXAMINATION:   GENERAL APPEARANCE:  The patient does not look to be in any distress.   CHEST/RESPIRATORY:  Clear air entry bilaterally.   CARDIOVASCULAR:  CARDIAC:   Heart sounds are normal.  He appears to be euvolemic.   GASTROINTESTINAL:  ABDOMEN:   Obese.  No masses.   LIVER/SPLEEN/KIDNEYS:  No liver, no spleen.  No tenderness.   GENITOURINARY:  BLADDER:   Not enlarged.   CIRCULATION:  EDEMA/VARICOSITIES:  Extremities:  Mild edema.  No evidence of a DVT.   ARTERIAL:  Peripheral pulses are difficult to feel, even at the popliteal.  Nevertheless, his feet are warm.  There does not appear to be any ongoing ischemia, at least at the bedside.          NEUROLOGICAL:    DEEP TENDON REFLEXES:  His reflexes are absent.   BALANCE/GAIT:  He is able to bring himself to a standing position with ease. Wide based gait.  Skin: bilateral groin incisions are well healed  ASSESSMENT/PLAN:                    Poorly controlled type 2 diabetes with severe PAD.  Extensive bilateral surgery as noted  above.  He appears to have done well.  He is on Levemir.  His BUN and creatinine have normalized.  I think we can probably resume his metformin when necessary.  Postoperative dysesthesias.  I agree that this is probably diabetic peripheral neuropathy.    Elevated liver function tests.  This occurred after he came here and included elevation of his ALT up to 507, AST at 384, and alk phos of 208.  His statin was put on hold and all of these appear to be improving.   I am not certain that the statin caused this and I wonder if this was some sort of postoperative shock liver type of situation.  Nevertheless, this appears to be improving at this point.   Postoperative renal insufficiency.  His BUN today is 13, creatinine 1.06, as opposed to 30 and 1.71 a week ago.    Anemia.  Hemoglobin yesterday was 7.6.  This was 7.8 when he left the hospital.  I am presuming most of this is postoperative.   Accelerated Hypertension; Lisinopril and HCTZ were discontinued due to renal issues/contrast dye etc. Monitor closely

## 2014-01-17 LAB — GLUCOSE, CAPILLARY
GLUCOSE-CAPILLARY: 201 mg/dL — AB (ref 70–99)
GLUCOSE-CAPILLARY: 97 mg/dL (ref 70–99)
GLUCOSE-CAPILLARY: 229 mg/dL — AB (ref 70–99)
Glucose-Capillary: 200 mg/dL — ABNORMAL HIGH (ref 70–99)

## 2014-01-18 LAB — GLUCOSE, CAPILLARY
GLUCOSE-CAPILLARY: 174 mg/dL — AB (ref 70–99)
GLUCOSE-CAPILLARY: 197 mg/dL — AB (ref 70–99)
Glucose-Capillary: 132 mg/dL — ABNORMAL HIGH (ref 70–99)
Glucose-Capillary: 223 mg/dL — ABNORMAL HIGH (ref 70–99)
Glucose-Capillary: 178 mg/dL — ABNORMAL HIGH (ref 70–99)
Glucose-Capillary: 197 mg/dL — ABNORMAL HIGH (ref 70–99)

## 2014-01-19 LAB — GLUCOSE, CAPILLARY
GLUCOSE-CAPILLARY: 199 mg/dL — AB (ref 70–99)
Glucose-Capillary: 235 mg/dL — ABNORMAL HIGH (ref 70–99)
Glucose-Capillary: 200 mg/dL — ABNORMAL HIGH (ref 70–99)
Glucose-Capillary: 167 mg/dL — ABNORMAL HIGH (ref 70–99)

## 2014-01-20 LAB — GLUCOSE, CAPILLARY
GLUCOSE-CAPILLARY: 125 mg/dL — AB (ref 70–99)
GLUCOSE-CAPILLARY: 196 mg/dL — AB (ref 70–99)

## 2014-01-21 ENCOUNTER — Encounter: Payer: Self-pay | Admitting: Vascular Surgery

## 2014-01-21 LAB — GLUCOSE, CAPILLARY
GLUCOSE-CAPILLARY: 164 mg/dL — AB (ref 70–99)
GLUCOSE-CAPILLARY: 225 mg/dL — AB (ref 70–99)
Glucose-Capillary: 237 mg/dL — ABNORMAL HIGH (ref 70–99)
Glucose-Capillary: 164 mg/dL — ABNORMAL HIGH (ref 70–99)
Glucose-Capillary: 148 mg/dL — ABNORMAL HIGH (ref 70–99)
Glucose-Capillary: 266 mg/dL — ABNORMAL HIGH (ref 70–99)

## 2014-01-22 ENCOUNTER — Encounter: Payer: Medicare HMO | Admitting: Vascular Surgery

## 2014-01-22 ENCOUNTER — Ambulatory Visit (HOSPITAL_COMMUNITY)
Admit: 2014-01-22 | Discharge: 2014-01-22 | Disposition: A | Discharge status: Home / Self Care | Payer: Medicare HMO | Attending: Vascular Surgery | Admitting: Vascular Surgery

## 2014-01-22 ENCOUNTER — Ambulatory Visit (INDEPENDENT_AMBULATORY_CARE_PROVIDER_SITE_OTHER): Payer: Medicare HMO | Admitting: Vascular Surgery

## 2014-01-22 ENCOUNTER — Encounter: Payer: Self-pay | Admitting: Vascular Surgery

## 2014-01-22 VITALS — BP 190/84 | HR 69 | Resp 16 | Ht 66.0 in | Wt 201.0 lb

## 2014-01-22 DIAGNOSIS — Z48812 Encounter for surgical aftercare following surgery on the circulatory system: Secondary | ICD-10-CM | POA: Insufficient documentation

## 2014-01-22 DIAGNOSIS — I739 Peripheral vascular disease, unspecified: Secondary | ICD-10-CM | POA: Diagnosis present

## 2014-01-22 LAB — GLUCOSE, CAPILLARY: Glucose-Capillary: 149 mg/dL — ABNORMAL HIGH (ref 70–99)

## 2014-01-22 NOTE — Progress Notes (Signed)
   Patient name: [name] MRN: [id number] DOB: 07/14/1950 Sex: male  REASON FOR VISIT: Follow up after thrombectomy of occluded limb of aortobifemoral bypass graft, and bilateral femoral endarterectomies.  HPI: [name] is a 63 y.o. male who had undergone an aortobifemoral bypass graft by Dr. Liliane BadeGreg Hayes in 2007. He also had bilateral lower extremity bypass grafts which are chronically occluded. He presented with paresthesias in his left lower extremity and was felt that he had occluded the left limb of his aortobifemoral bypass graft. Arteriogram demonstrated significant disease in both common femoral arteries.  On 01/03/2014, he underwent thrombectomy of the left limb of his aortobifemoral bypass graft, extensive endarterectomy of the left common femoral artery and deep femoral artery with bypass from the left limb of the aortic graft to the deep femoral artery using an 8 mm Dacron graft. In addition he had thrombectomy of the right limb of his graft for some laminated clot and endarterectomy of the right common femoral artery with Dacron patch angioplasty of the distal right limb of his aortofemoral bypass graft.  He is still at the skilled nerve seeing facility. He tells me that he is ambulating some with assistance.   REVIEW OF SYSTEMS: Arly.Keller[X ] denotes positive finding; [  ] denotes negative finding  CARDIOVASCULAR:  [ ]  chest pain   [ ]  dyspnea on exertion    CONSTITUTIONAL:  [ ]  fever   [ ]  chills  PHYSICAL EXAM: Filed Vitals:   01/22/14 1150 01/22/14 1159  BP: 193/80 190/84  Pulse: 70 69  Resp: 16   Height: 5\' 6"  (1.676 m)   Weight: 201 lb (91.173 kg)    Body mass index is 32.46 kg/(m^2). GENERAL: The patient is a well-nourished male, in no acute distress. The vital signs are documented above. CARDIOVASCULAR: There is a regular rate and rhythm. PULMONARY: There is good air exchange bilaterally without wheezing or rales. His groin incisions are healing well. Both feet  are warm and well perfused.  I have independently interpreted his arterial Doppler study today which shows monophasic Doppler signals in both feet with an ABI of 70% on the right and 47% on the left. Toe pressure on the right is 107 mmHg. Toe pressure on the left is 52 mmHg.  MEDICAL ISSUES: Overall pleased with his progress. I would like to see him back in 3 months to make sure that he has no issues with his groins given his body habitus. I have explained that he has severe diffuse disease of the left lower extremity and if his graft occludes on the left are not sure there are any remaining options for revascularization. This reason I encouraged him to stay as active as possible. Fortunately he is not a smoker.  DICKSON,CHRISTOPHER S [facility] Beeper: [phone]

## 2014-01-22 NOTE — Addendum Note (Signed)
Addended by: Sharee PimpleMCCHESNEY, Angele Wiemann K on: 01/22/2014 01:49 PM   Modules accepted: Orders

## 2014-01-23 ENCOUNTER — Encounter (HOSPITAL_COMMUNITY): Payer: Self-pay | Admitting: Vascular Surgery

## 2014-01-23 LAB — GLUCOSE, CAPILLARY
GLUCOSE-CAPILLARY: 316 mg/dL — AB (ref 70–99)
GLUCOSE-CAPILLARY: 239 mg/dL — AB (ref 70–99)
Glucose-Capillary: 161 mg/dL — ABNORMAL HIGH (ref 70–99)

## 2014-01-24 LAB — GLUCOSE, CAPILLARY
GLUCOSE-CAPILLARY: 203 mg/dL — AB (ref 70–99)
GLUCOSE-CAPILLARY: 204 mg/dL — AB (ref 70–99)
Glucose-Capillary: 170 mg/dL — ABNORMAL HIGH (ref 70–99)
Glucose-Capillary: 253 mg/dL — ABNORMAL HIGH (ref 70–99)
Glucose-Capillary: 152 mg/dL — ABNORMAL HIGH (ref 70–99)
Glucose-Capillary: 153 mg/dL — ABNORMAL HIGH (ref 70–99)
Glucose-Capillary: 185 mg/dL — ABNORMAL HIGH (ref 70–99)

## 2014-01-25 LAB — GLUCOSE, CAPILLARY
GLUCOSE-CAPILLARY: 192 mg/dL — AB (ref 70–99)
GLUCOSE-CAPILLARY: 168 mg/dL — AB (ref 70–99)

## 2014-01-26 LAB — GLUCOSE, CAPILLARY
GLUCOSE-CAPILLARY: 194 mg/dL — AB (ref 70–99)
Glucose-Capillary: 153 mg/dL — ABNORMAL HIGH (ref 70–99)
Glucose-Capillary: 217 mg/dL — ABNORMAL HIGH (ref 70–99)

## 2014-01-27 LAB — GLUCOSE, CAPILLARY
GLUCOSE-CAPILLARY: 239 mg/dL — AB (ref 70–99)
GLUCOSE-CAPILLARY: 161 mg/dL — AB (ref 70–99)
Glucose-Capillary: 188 mg/dL — ABNORMAL HIGH (ref 70–99)
Glucose-Capillary: 247 mg/dL — ABNORMAL HIGH (ref 70–99)
Glucose-Capillary: 205 mg/dL — ABNORMAL HIGH (ref 70–99)
Glucose-Capillary: 255 mg/dL — ABNORMAL HIGH (ref 70–99)
Glucose-Capillary: 277 mg/dL — ABNORMAL HIGH (ref 70–99)
Glucose-Capillary: 264 mg/dL — ABNORMAL HIGH (ref 70–99)

## 2014-01-28 LAB — GLUCOSE, CAPILLARY
GLUCOSE-CAPILLARY: 222 mg/dL — AB (ref 70–99)
GLUCOSE-CAPILLARY: 166 mg/dL — AB (ref 70–99)

## 2014-01-29 LAB — GLUCOSE, CAPILLARY
GLUCOSE-CAPILLARY: 136 mg/dL — AB (ref 70–99)
GLUCOSE-CAPILLARY: 184 mg/dL — AB (ref 70–99)
Glucose-Capillary: 260 mg/dL — ABNORMAL HIGH (ref 70–99)
Glucose-Capillary: 193 mg/dL — ABNORMAL HIGH (ref 70–99)
Glucose-Capillary: 188 mg/dL — ABNORMAL HIGH (ref 70–99)
Glucose-Capillary: 154 mg/dL — ABNORMAL HIGH (ref 70–99)

## 2014-01-30 LAB — GLUCOSE, CAPILLARY
GLUCOSE-CAPILLARY: 126 mg/dL — AB (ref 70–99)
GLUCOSE-CAPILLARY: 159 mg/dL — AB (ref 70–99)
GLUCOSE-CAPILLARY: 171 mg/dL — AB (ref 70–99)
Glucose-Capillary: 168 mg/dL — ABNORMAL HIGH (ref 70–99)

## 2014-01-31 LAB — GLUCOSE, CAPILLARY
Glucose-Capillary: 122 mg/dL — ABNORMAL HIGH (ref 70–99)
Glucose-Capillary: 198 mg/dL — ABNORMAL HIGH (ref 70–99)
Glucose-Capillary: 135 mg/dL — ABNORMAL HIGH (ref 70–99)
Glucose-Capillary: 273 mg/dL — ABNORMAL HIGH (ref 70–99)

## 2014-02-01 ENCOUNTER — Encounter: Payer: Self-pay | Admitting: Internal Medicine

## 2014-02-01 ENCOUNTER — Non-Acute Institutional Stay (SKILLED_NURSING_FACILITY): Payer: Medicare HMO | Admitting: Internal Medicine

## 2014-02-01 DIAGNOSIS — I739 Peripheral vascular disease, unspecified: Secondary | ICD-10-CM

## 2014-02-01 DIAGNOSIS — R945 Abnormal results of liver function studies: Secondary | ICD-10-CM

## 2014-02-01 DIAGNOSIS — R7989 Other specified abnormal findings of blood chemistry: Secondary | ICD-10-CM

## 2014-02-01 DIAGNOSIS — E785 Hyperlipidemia, unspecified: Secondary | ICD-10-CM

## 2014-02-01 DIAGNOSIS — I998 Other disorder of circulatory system: Secondary | ICD-10-CM

## 2014-02-01 DIAGNOSIS — J42 Unspecified chronic bronchitis: Secondary | ICD-10-CM

## 2014-02-01 DIAGNOSIS — I10 Essential (primary) hypertension: Secondary | ICD-10-CM

## 2014-02-01 DIAGNOSIS — E1151 Type 2 diabetes mellitus with diabetic peripheral angiopathy without gangrene: Secondary | ICD-10-CM

## 2014-02-01 DIAGNOSIS — I70209 Unspecified atherosclerosis of native arteries of extremities, unspecified extremity: Secondary | ICD-10-CM

## 2014-02-01 DIAGNOSIS — I2510 Atherosclerotic heart disease of native coronary artery without angina pectoris: Secondary | ICD-10-CM

## 2014-02-01 DIAGNOSIS — E1159 Type 2 diabetes mellitus with other circulatory complications: Secondary | ICD-10-CM

## 2014-02-01 LAB — GLUCOSE, CAPILLARY
GLUCOSE-CAPILLARY: 212 mg/dL — AB (ref 70–99)
GLUCOSE-CAPILLARY: 218 mg/dL — AB (ref 70–99)
Glucose-Capillary: 161 mg/dL — ABNORMAL HIGH (ref 70–99)

## 2014-02-01 NOTE — Progress Notes (Signed)
Patient ID: [name], male   DOB: [birth year], 63 y.o.   MRN: [id number]   This is a discharge note   Level of care skilled.  Facility [facility].  Chief complaint;;;Discharge note  History of present.  Patient is a pleasant 63 year old male status post hospitalization for lower limb ischemia status post thrombectomy of left limb of aortobifemoral bypass graft-bypass from the left limb of the atrial femoral bypass graft to the deep femoral artery-also thrombectomy of the right limb of the aortofemoral bypass graft-and endarterectomy of the right common femoral artery.   . This is followed by vascular surgery and is progressing fairly well-he does have some inguinal surgical sites that bear monitoring-he will have home health support at home.  He has gained strength here is ambulating with a walker appears to be in good spirits is looking forward to getting home he will be with his wife he also has help from other family.    This is complicated with a history of diabetes that has been uncontrolled with hemoglobin A1c of 11.1 apparently he is on Levemir his Glucophage was held in the hospital secondary to renal issues-he continues on the Levemir he is also on Humalog insulin 12 units with meals if CBG is greater than 150-his sugars are somewhat variable largely in the mid 100s in the morning-later in the day mid 100s to more frequent readings above 200-this appears to be a case also and later day parts and HS-- ranging from the mid 100s to low 200s generally  Patient does have a history of coronary artery disease status post pacemaker this has been stable.  He also has a history of a silver 8 hypertension his ACE inhibitor was held in the hospital as well as hydrochlorothiazide because of his renal issues he is on a clonidine patch and Lopressor--and he also is on lisinopril now-blood pressures also appear to be somewhat variable I got 158/90 manually today-this appears to be somewhat  baseline recently I see occasionally spiking to the 180s systolically--as well as some 120s--again quite a bit of variability here.  I suspect this will need follow-up by his primary care provider.  Patient also has a history of depression psychiatric and social workers were consulted in the hospital recommendation for an outpatient psych evaluation-he was denying any suicidal or homicidal idealization   this has not really been an issue here-  Initially when patient came here last showed significantly elevated liver function tests within alkaline phosphatase of 208 AST 384 ALT 507- His statin as well as when necessary Tylenol was DC'd and his liver function tests a few days later showed considerable improvement in fact the only abnormality was the ALT at 129 alkaline phosphatase 134 albumin 2.4.  -Labs done today actually show normalization alkaline phosphatase of 60 -- AST of 13 ALT of 11 albumin is still low at 2.6  Today he has no complaints she is looking forward to going home-he appears to have quite a supportive family-she will need continued PT and OT for strengthening as well as nursing support-his family is familiar with his diabetic monitoring   .  C   Previous medical history.  Lower limb ischemia status post procedures as noted above.  Diabetic neuropathy.  Hypertension.  Arteriosclerotic cardiovascular disease.  Cerebral vascular disease.  Peripheral vascular disease.  sick sinus syndrome status post pacemaker.  Obstructive sleep apnea.  Family medical social history as been reviewed per discharge note on 01/06/2014.  Medications.  T.  Albuterol inhaler 2 puffs every 4 hours when necessary.  Aspirin 81 mg daily.  Neurontin 300 mg 3 times a day.  Levemir flex pen 45 units daily at bedtime.  Humalog insulin 12 units in the skin 3 times daily with meals if CBG greater than 150.  Lopressor 25 mg twice a day.  Oxycodone 5 mg every 6 hours when  necessary pain.  Simvastatin milligrams daily at bedtime.  Clonidine patch 0.1 mg change Q weekly   Review of systems.  In general does not complain of any fever or chills.  Skin does not complain of any rashes or itching surgical sites apparently have been stable.--improving  Head ears eyes nose mouth and throat-does not complain of any visual changes or sore throat.  Respiratory does not complain of cough or shortness of breath.  Cardiac does not complain of chest pain has significant vascular disease as noted above.  GI does not complain of abdominal discomfort has a chronically protuberant abdomen does not complain of constipation nausea or vomiting.  GU is not complaining of dysuria.  Musculoskeletal has some weakness--it appears stronger --t does not complain of joint pain currently.  Neurologic history of significant neuropathy but does not complain of any dizziness or headaches or syncopal-type feelings.  Psych history of depression does not complain overtly of depression or anxiety currently.   Physical exam.  T- 98.0 pulse 70 respirations 20 blood pressure 158/90.  In general this is a pleasant middle-age male in no distress  His skin is warm and dry-his bilateral inguinal surgical sites appear to be healing fairly unremarkably there is still some small open areas-there is no drainage bleeding or firmness or tenderness here I do not note significant erythema -he does have well-healed surgical scars lower legs bilaterally.  Eyes are reactive to light sclerae and conjunctivae are clear visual acuity appears grossly intact.  Oropharynx clear mucous membranes moist.  Chest is clear to auscultation with a small amount of bronchial sounds there is no labored breathing.-Somewhat shallow air entry   Heart is regular rate and rhythm -- he has reduced pedal pulses bilaterally and has mild lower extremity edema --t this is baseline.  Abdomen is significantly protuberant  soft nontender with positive bowel sounds.     Musculoskeletal does move all extremities 4 --is able to stand without assistance and uses walker   Neurologic appears to be grossly intact speech is clear I did not see any overt lateralizing findings-he does have a history of neuropathy touch sensation appeared to be intact however of his lower extremities  Psych he appears grossly alert and oriented pleasant and appropriate.  Labs.  02/01/2014.  WBC 5.3 hemoglobin 8.7 platelets 209.  Sodium 141 potassium 4.4 BUN 17 creatinine 1.25-liver function tests within normal limits except albumin of 2.6  01/06/2014.  Sodium 131 potassium 4.5 BUN 30 creatinine 1.71 it appears baseline creatinine is in the lower ones per chart review  WBC 8.6 hemoglobin 8.4 platelets 156.  .  01/04/2014.  AST 56 ALT 19 alkaline phosphatase phosphatase 39 bilirubin 0.7-albumin 2.9.   Assessment and plan.  #1 history of lower limb ischemia past grafting with history of arteriosclerotic disease-this has been stable and been doing relatively well-this will have follow-up-he does take Ultram as needed for pain again Tylenol as been DC'd secondary to elevated liver function tests -.  He does continue on aspirin as well as a statin.  #2 history of diabetes apparently poorly controlled previously-he is on Levemir as well  as Humalog before meals--I suspect sugars have been improved here in a controlled environment-pharmacy apparently says Humalog will require prior approval for outpatient meds-however NovoLog has been approved.  #3 history of diabetic neuropathy is on Neurontin apparently this is significant but stable.  #4-history of hypertension againACE and hydrochlorothiazide were held secondary to renal issues continues on Toprol all as well as a clonidine patch and lisinopril has been  re started as well  #5-history of renal insufficiency --this has returned to baseline   #6-history of anemia --it  appears hemoglobin dropped into the low 8 status post procedure-this appears to be slowly rising he is on iron.   #7-history depression-at this point patient appears to be good spirits-again discharge summary recommends a possible psychiatric follow-up--again has been stable here will defer to primary care provider for follow-up.  8-history COPD-he does continue on albuterol inhaler   Patient continues to be stable-he will be receiving home health and nursing support for his multiple medical issues-also would benefit from PT and OT for further strengthening-he will be with family.  [id number]-of note greater than 30 minutes spent on this discharge summary.

## 2014-02-02 ENCOUNTER — Other Ambulatory Visit: Payer: Self-pay | Admitting: Family Medicine

## 2014-02-03 NOTE — Telephone Encounter (Signed)
Refill appropriate and filled per protocol. 

## 2014-02-04 DIAGNOSIS — I739 Peripheral vascular disease, unspecified: Secondary | ICD-10-CM

## 2014-02-04 DIAGNOSIS — I25709 Atherosclerosis of coronary artery bypass graft(s), unspecified, with unspecified angina pectoris: Secondary | ICD-10-CM

## 2014-02-04 DIAGNOSIS — Z48812 Encounter for surgical aftercare following surgery on the circulatory system: Secondary | ICD-10-CM

## 2014-02-04 DIAGNOSIS — E1140 Type 2 diabetes mellitus with diabetic neuropathy, unspecified: Secondary | ICD-10-CM

## 2014-02-10 ENCOUNTER — Ambulatory Visit (INDEPENDENT_AMBULATORY_CARE_PROVIDER_SITE_OTHER): Payer: Medicare HMO | Admitting: Family Medicine

## 2014-02-10 ENCOUNTER — Encounter: Payer: Self-pay | Admitting: Family Medicine

## 2014-02-10 VITALS — BP 138/72 | HR 76 | Temp 98.2°F | Resp 16 | Ht 66.0 in | Wt 198.0 lb

## 2014-02-10 DIAGNOSIS — E1159 Type 2 diabetes mellitus with other circulatory complications: Secondary | ICD-10-CM

## 2014-02-10 DIAGNOSIS — R2681 Unsteadiness on feet: Secondary | ICD-10-CM

## 2014-02-10 DIAGNOSIS — E1151 Type 2 diabetes mellitus with diabetic peripheral angiopathy without gangrene: Secondary | ICD-10-CM

## 2014-02-10 DIAGNOSIS — N508 Other specified disorders of male genital organs: Secondary | ICD-10-CM

## 2014-02-10 DIAGNOSIS — I10 Essential (primary) hypertension: Secondary | ICD-10-CM

## 2014-02-10 DIAGNOSIS — IMO0001 Reserved for inherently not codable concepts without codable children: Secondary | ICD-10-CM

## 2014-02-10 DIAGNOSIS — T814XXD Infection following a procedure, subsequent encounter: Secondary | ICD-10-CM

## 2014-02-10 DIAGNOSIS — I739 Peripheral vascular disease, unspecified: Secondary | ICD-10-CM

## 2014-02-10 DIAGNOSIS — I70209 Unspecified atherosclerosis of native arteries of extremities, unspecified extremity: Secondary | ICD-10-CM

## 2014-02-10 DIAGNOSIS — N5089 Other specified disorders of the male genital organs: Secondary | ICD-10-CM | POA: Insufficient documentation

## 2014-02-10 LAB — CBC WITH DIFFERENTIAL/PLATELET
BASOS PCT: 0 % (ref 0–1)
Basophils Absolute: 0 10*3/uL (ref 0.0–0.1)
EOS ABS: 0.3 10*3/uL (ref 0.0–0.7)
Eosinophils Relative: 6 % — ABNORMAL HIGH (ref 0–5)
HCT: 34 % — ABNORMAL LOW (ref 39.0–52.0)
Hemoglobin: 10.7 g/dL — ABNORMAL LOW (ref 13.0–17.0)
LYMPHS ABS: 1.5 10*3/uL (ref 0.7–4.0)
Lymphocytes Relative: 28 % (ref 12–46)
MCH: 26.6 pg (ref 26.0–34.0)
MCHC: 31.5 g/dL (ref 30.0–36.0)
MCV: 84.6 fL (ref 78.0–100.0)
MONO ABS: 0.5 10*3/uL (ref 0.1–1.0)
MONOS PCT: 10 % (ref 3–12)
MPV: 9.9 fL (ref 9.4–12.4)
Neutro Abs: 3 10*3/uL (ref 1.7–7.7)
Neutrophils Relative %: 56 % (ref 43–77)
Platelets: 297 10*3/uL (ref 150–400)
RBC: 4.02 MIL/uL — AB (ref 4.22–5.81)
RDW: 13.4 % (ref 11.5–15.5)
WBC: 5.4 10*3/uL (ref 4.0–10.5)

## 2014-02-10 LAB — COMPREHENSIVE METABOLIC PANEL
ALT: 12 U/L (ref 0–53)
AST: 14 U/L (ref 0–37)
Albumin: 3.6 g/dL (ref 3.5–5.2)
Alkaline Phosphatase: 63 U/L (ref 39–117)
BUN: 10 mg/dL (ref 6–23)
CHLORIDE: 102 meq/L (ref 96–112)
CO2: 28 mEq/L (ref 19–32)
Calcium: 9.6 mg/dL (ref 8.4–10.5)
Creat: 1.12 mg/dL (ref 0.50–1.35)
Glucose, Bld: 234 mg/dL — ABNORMAL HIGH (ref 70–99)
POTASSIUM: 4.3 meq/L (ref 3.5–5.3)
Sodium: 139 mEq/L (ref 135–145)
Total Bilirubin: 0.7 mg/dL (ref 0.2–1.2)
Total Protein: 6.9 g/dL (ref 6.0–8.3)

## 2014-02-10 MED ORDER — SULFAMETHOXAZOLE-TRIMETHOPRIM 800-160 MG PO TABS: 1.0000 | ORAL_TABLET | Freq: Two times a day (BID) | ORAL | Status: DC

## 2014-02-10 NOTE — Patient Instructions (Signed)
Increase the levemir to 50 units Take antibiotics Bactrim twice a day  Ultrasound on scrotum We will call with liver labs Walker to be ordered F/U 2 months

## 2014-02-10 NOTE — Assessment & Plan Note (Signed)
Gait instability noted. He is walking with a short cane currently. I will order him a rolling walker to improve his ambulation

## 2014-02-10 NOTE — Progress Notes (Signed)
Patient ID: Collin LeitzWilliam F Cavness, male   DOB: 01/05/1951, 63 y.o.   MRN: [id number]   Subjective:    Patient ID: Collin LeitzWilliam F [place], male    DOB: 01/29/1951, 63 y.o.   MRN: [id number]  Patient presents for Face to Face patient here for hospital follow-up. He was admitted with leg swelling. He is status post thrombectomy of left limb with aortobifemoral bypass graft secondary to limb ischemia. He also had thrombectomy of the right limb and bypass graft. He still has some swelling in both legs. He requests a walker to use at home as he gives out very quickly while walking. He did go to a skilled nursing facility for short-term rehabilitation. He had follow-up with his vascular surgeon recently. He still has openings in the groin area bilateral with some yellow discharge and mild odor. He also has significant swelling of his scrotum which she states has not gone down. His blood sugars are uncontrolled his A1c was 11% 4 weeks ago. He is given Levemir 46 units and Humalog 12 units with meals as blood sugar meter shows a 14 day average of 248.his metformin was held while he was admitted secondary to renal insufficiency. His ACE inhibitor and hydrochlorothiazide were also on hold during that time. He states that his cholesterol medicine was discontinued because his liver function was very high this was noted this was noted in the nursing on discharge by do not see the actual values and his lab results By the notes he is supposed to be back on his statin as well as his ACE inhibitor.  Review Of Systems:  GEN- denies fatigue, fever, weight loss,weakness, recent illness HEENT- denies eye drainage, change in vision, nasal discharge, CVS- denies chest pain, palpitations RESP- denies SOB, cough, wheeze ABD- denies N/V, change in stools, abd pain GU- denies dysuria, hematuria, dribbling, incontinence MSK- + joint pain, muscle aches, injury Neuro- denies headache, dizziness, syncope, seizure activity       Objective:     BP 138/72 mmHg  Pulse 76  Temp(Src) 98.2 F (36.8 C) (Oral)  Resp 16  Ht 5\' 6"  (1.676 m)  Wt 198 lb (89.812 kg)  BMI 31.97 kg/m2 GEN- NAD, alert and oriented x3 HEENT- PERRL, EOMI, non injected sclera, pink conjunctiva, MMM, oropharynx clear Neck- Supple, no LAD CVS- RRR, no murmur RESP-CTAB ABD-NABS,soft,NT,ND GU- Edema of scrotum induration of right scrotal wall, mild erythema Skin- bilat incisions- small incisions draining yellow pus, NT, mild odor  small blister along right lower leg vein incision, multiple scabs on knees bilat and shins, no drainage EXT- pitting  Edema to mid shins Pulses- Radial 2+ , DP decreased bilat        Assessment & Plan:      Problem List Items Addressed This Visit    None    Visit Diagnoses    Postoperative wound infection, subsequent encounter    -  Primary    I think this is poor wound healind due to uncontrolled, diabetes, with induration and swelling of scrotum will cover with Bactrim    Relevant Medications       sulfamethoxazole-trimethoprim (BACTRIM/SEPTRA DS) tablet 800-160 mg    Other Relevant Orders       Comprehensive metabolic panel (Completed)       CBC with Differential (Completed)       Note: This dictation was prepared with Dragon dictation along with smaller phrase technology. Any transcriptional errors that result from this process are unintentional.

## 2014-02-10 NOTE — Assessment & Plan Note (Signed)
Uncontrolled DM, increase levemir to 50 units, humalog 12 units QAC

## 2014-02-10 NOTE — Assessment & Plan Note (Signed)
Recheck his renal function. He is not on ACE inhibitor today he is wearing a clonidine patch if his renal function is back to baseline with type him back on the ACE inhibitor

## 2014-02-11 ENCOUNTER — Encounter (HOSPITAL_COMMUNITY): Payer: Self-pay | Admitting: *Deleted

## 2014-02-11 ENCOUNTER — Emergency Department (HOSPITAL_COMMUNITY): Payer: Medicare HMO

## 2014-02-11 ENCOUNTER — Inpatient Hospital Stay (HOSPITAL_COMMUNITY)
Admission: EM | Admit: 2014-02-11 | Discharge: 2014-03-04 | DRG: 981 | Disposition: A | Discharge status: Skilled Nursing Facility | Payer: Medicare HMO | Attending: Internal Medicine | Admitting: Internal Medicine

## 2014-02-11 ENCOUNTER — Other Ambulatory Visit: Payer: Self-pay | Admitting: Family Medicine

## 2014-02-11 ENCOUNTER — Telehealth: Payer: Self-pay | Admitting: Family Medicine

## 2014-02-11 DIAGNOSIS — R0602 Shortness of breath: Secondary | ICD-10-CM

## 2014-02-11 DIAGNOSIS — G629 Polyneuropathy, unspecified: Secondary | ICD-10-CM | POA: Diagnosis present

## 2014-02-11 DIAGNOSIS — I25700 Atherosclerosis of coronary artery bypass graft(s), unspecified, with unstable angina pectoris: Secondary | ICD-10-CM | POA: Diagnosis present

## 2014-02-11 DIAGNOSIS — N400 Enlarged prostate without lower urinary tract symptoms: Secondary | ICD-10-CM | POA: Diagnosis present

## 2014-02-11 DIAGNOSIS — Z950 Presence of cardiac pacemaker: Secondary | ICD-10-CM | POA: Diagnosis present

## 2014-02-11 DIAGNOSIS — E1159 Type 2 diabetes mellitus with other circulatory complications: Secondary | ICD-10-CM

## 2014-02-11 DIAGNOSIS — Z794 Long term (current) use of insulin: Secondary | ICD-10-CM

## 2014-02-11 DIAGNOSIS — Z7951 Long term (current) use of inhaled steroids: Secondary | ICD-10-CM | POA: Diagnosis not present

## 2014-02-11 DIAGNOSIS — I25110 Atherosclerotic heart disease of native coronary artery with unstable angina pectoris: Secondary | ICD-10-CM | POA: Diagnosis present

## 2014-02-11 DIAGNOSIS — E669 Obesity, unspecified: Secondary | ICD-10-CM | POA: Diagnosis present

## 2014-02-11 DIAGNOSIS — R0902 Hypoxemia: Secondary | ICD-10-CM

## 2014-02-11 DIAGNOSIS — I2510 Atherosclerotic heart disease of native coronary artery without angina pectoris: Secondary | ICD-10-CM | POA: Diagnosis present

## 2014-02-11 DIAGNOSIS — D72829 Elevated white blood cell count, unspecified: Secondary | ICD-10-CM | POA: Insufficient documentation

## 2014-02-11 DIAGNOSIS — D649 Anemia, unspecified: Secondary | ICD-10-CM | POA: Diagnosis present

## 2014-02-11 DIAGNOSIS — E1165 Type 2 diabetes mellitus with hyperglycemia: Secondary | ICD-10-CM | POA: Diagnosis present

## 2014-02-11 DIAGNOSIS — E785 Hyperlipidemia, unspecified: Secondary | ICD-10-CM | POA: Diagnosis present

## 2014-02-11 DIAGNOSIS — N5089 Other specified disorders of the male genital organs: Secondary | ICD-10-CM

## 2014-02-11 DIAGNOSIS — R778 Other specified abnormalities of plasma proteins: Secondary | ICD-10-CM | POA: Diagnosis present

## 2014-02-11 DIAGNOSIS — E872 Acidosis: Secondary | ICD-10-CM | POA: Diagnosis present

## 2014-02-11 DIAGNOSIS — B9562 Methicillin resistant Staphylococcus aureus infection as the cause of diseases classified elsewhere: Secondary | ICD-10-CM | POA: Diagnosis present

## 2014-02-11 DIAGNOSIS — J9601 Acute respiratory failure with hypoxia: Secondary | ICD-10-CM | POA: Diagnosis present

## 2014-02-11 DIAGNOSIS — G4733 Obstructive sleep apnea (adult) (pediatric): Secondary | ICD-10-CM | POA: Diagnosis present

## 2014-02-11 DIAGNOSIS — R5383 Other fatigue: Secondary | ICD-10-CM | POA: Insufficient documentation

## 2014-02-11 DIAGNOSIS — E875 Hyperkalemia: Secondary | ICD-10-CM | POA: Diagnosis not present

## 2014-02-11 DIAGNOSIS — K219 Gastro-esophageal reflux disease without esophagitis: Secondary | ICD-10-CM | POA: Diagnosis present

## 2014-02-11 DIAGNOSIS — A419 Sepsis, unspecified organism: Secondary | ICD-10-CM | POA: Diagnosis present

## 2014-02-11 DIAGNOSIS — T827XXA Infection and inflammatory reaction due to other cardiac and vascular devices, implants and grafts, initial encounter: Secondary | ICD-10-CM | POA: Diagnosis present

## 2014-02-11 DIAGNOSIS — R34 Anuria and oliguria: Secondary | ICD-10-CM | POA: Diagnosis not present

## 2014-02-11 DIAGNOSIS — L02214 Cutaneous abscess of groin: Secondary | ICD-10-CM | POA: Diagnosis present

## 2014-02-11 DIAGNOSIS — Z87891 Personal history of nicotine dependence: Secondary | ICD-10-CM | POA: Diagnosis not present

## 2014-02-11 DIAGNOSIS — Z7982 Long term (current) use of aspirin: Secondary | ICD-10-CM

## 2014-02-11 DIAGNOSIS — A4902 Methicillin resistant Staphylococcus aureus infection, unspecified site: Secondary | ICD-10-CM | POA: Insufficient documentation

## 2014-02-11 DIAGNOSIS — J449 Chronic obstructive pulmonary disease, unspecified: Secondary | ICD-10-CM | POA: Diagnosis present

## 2014-02-11 DIAGNOSIS — Z8249 Family history of ischemic heart disease and other diseases of the circulatory system: Secondary | ICD-10-CM

## 2014-02-11 DIAGNOSIS — I739 Peripheral vascular disease, unspecified: Secondary | ICD-10-CM | POA: Diagnosis present

## 2014-02-11 DIAGNOSIS — N179 Acute kidney failure, unspecified: Secondary | ICD-10-CM | POA: Diagnosis present

## 2014-02-11 DIAGNOSIS — R188 Other ascites: Secondary | ICD-10-CM

## 2014-02-11 DIAGNOSIS — E871 Hypo-osmolality and hyponatremia: Secondary | ICD-10-CM | POA: Diagnosis not present

## 2014-02-11 DIAGNOSIS — N170 Acute kidney failure with tubular necrosis: Secondary | ICD-10-CM | POA: Diagnosis not present

## 2014-02-11 DIAGNOSIS — Z951 Presence of aortocoronary bypass graft: Secondary | ICD-10-CM

## 2014-02-11 DIAGNOSIS — R6510 Systemic inflammatory response syndrome (SIRS) of non-infectious origin without acute organ dysfunction: Secondary | ICD-10-CM | POA: Insufficient documentation

## 2014-02-11 DIAGNOSIS — I10 Essential (primary) hypertension: Secondary | ICD-10-CM | POA: Diagnosis present

## 2014-02-11 DIAGNOSIS — E1151 Type 2 diabetes mellitus with diabetic peripheral angiopathy without gangrene: Secondary | ICD-10-CM | POA: Diagnosis present

## 2014-02-11 DIAGNOSIS — T148XXA Other injury of unspecified body region, initial encounter: Secondary | ICD-10-CM

## 2014-02-11 DIAGNOSIS — I70209 Unspecified atherosclerosis of native arteries of extremities, unspecified extremity: Secondary | ICD-10-CM

## 2014-02-11 DIAGNOSIS — Z6833 Body mass index (BMI) 33.0-33.9, adult: Secondary | ICD-10-CM

## 2014-02-11 DIAGNOSIS — Y832 Surgical operation with anastomosis, bypass or graft as the cause of abnormal reaction of the patient, or of later complication, without mention of misadventure at the time of the procedure: Secondary | ICD-10-CM | POA: Diagnosis present

## 2014-02-11 DIAGNOSIS — R509 Fever, unspecified: Secondary | ICD-10-CM

## 2014-02-11 DIAGNOSIS — J9690 Respiratory failure, unspecified, unspecified whether with hypoxia or hypercapnia: Secondary | ICD-10-CM

## 2014-02-11 DIAGNOSIS — L24A9 Irritant contact dermatitis due friction or contact with other specified body fluids: Secondary | ICD-10-CM

## 2014-02-11 DIAGNOSIS — R410 Disorientation, unspecified: Secondary | ICD-10-CM

## 2014-02-11 DIAGNOSIS — R7989 Other specified abnormal findings of blood chemistry: Secondary | ICD-10-CM

## 2014-02-11 LAB — CBC WITH DIFFERENTIAL/PLATELET
Basophils Absolute: 0 10*3/uL (ref 0.0–0.1)
Basophils Relative: 0 % (ref 0–1)
Eosinophils Absolute: 0 10*3/uL (ref 0.0–0.7)
Eosinophils Relative: 0 % (ref 0–5)
HEMATOCRIT: 35 % — AB (ref 39.0–52.0)
HEMOGLOBIN: 10.9 g/dL — AB (ref 13.0–17.0)
LYMPHS ABS: 0.5 10*3/uL — AB (ref 0.7–4.0)
Lymphocytes Relative: 3 % — ABNORMAL LOW (ref 12–46)
MCH: 27.2 pg (ref 26.0–34.0)
MCHC: 31.1 g/dL (ref 30.0–36.0)
MCV: 87.3 fL (ref 78.0–100.0)
MONO ABS: 1.2 10*3/uL — AB (ref 0.1–1.0)
MONOS PCT: 7 % (ref 3–12)
NEUTROS ABS: 15.3 10*3/uL — AB (ref 1.7–7.7)
NEUTROS PCT: 90 % — AB (ref 43–77)
Platelets: 293 10*3/uL (ref 150–400)
RBC: 4.01 MIL/uL — AB (ref 4.22–5.81)
RDW: 13.2 % (ref 11.5–15.5)
WBC: 17 10*3/uL — ABNORMAL HIGH (ref 4.0–10.5)

## 2014-02-11 LAB — COMPREHENSIVE METABOLIC PANEL
ALK PHOS: 61 U/L (ref 39–117)
ALT: 15 U/L (ref 0–53)
ANION GAP: 7 (ref 5–15)
AST: 15 U/L (ref 0–37)
Albumin: 3.6 g/dL (ref 3.5–5.2)
BUN: 10 mg/dL (ref 6–23)
CHLORIDE: 102 meq/L (ref 96–112)
CO2: 28 mmol/L (ref 19–32)
CREATININE: 1.36 mg/dL — AB (ref 0.50–1.35)
Calcium: 9.6 mg/dL (ref 8.4–10.5)
GFR, EST AFRICAN AMERICAN: 62 mL/min — AB (ref >90)
GFR, EST NON AFRICAN AMERICAN: 53 mL/min — AB (ref >90)
GLUCOSE: 235 mg/dL — AB (ref 70–99)
POTASSIUM: 4.1 mmol/L (ref 3.5–5.1)
Sodium: 137 mmol/L (ref 135–145)
Total Bilirubin: 1.1 mg/dL (ref 0.3–1.2)
Total Protein: 7.7 g/dL (ref 6.0–8.3)

## 2014-02-11 LAB — URINE MICROSCOPIC-ADD ON

## 2014-02-11 LAB — BLOOD GAS, ARTERIAL
Acid-base deficit: 1.1 mmol/L (ref 0.0–2.0)
BICARBONATE: 23.3 meq/L (ref 20.0–24.0)
Delivery systems: POSITIVE
Drawn by: 38235
EXPIRATORY PAP: 5
FIO2: 0.45 %
Inspiratory PAP: 10
O2 Saturation: 93.7 %
PH ART: 7.383 (ref 7.350–7.450)
Patient temperature: 37
RATE: 8 resp/min
TCO2: 21.7 mmol/L (ref 0–100)
pCO2 arterial: 40.1 mmHg (ref 35.0–45.0)
pO2, Arterial: 74.2 mmHg — ABNORMAL LOW (ref 80.0–100.0)

## 2014-02-11 LAB — TSH: TSH: 1.341 u[IU]/mL (ref 0.350–4.500)

## 2014-02-11 LAB — URINALYSIS, ROUTINE W REFLEX MICROSCOPIC
Bilirubin Urine: NEGATIVE
Glucose, UA: 250 mg/dL — AB
Ketones, ur: NEGATIVE mg/dL
Leukocytes, UA: NEGATIVE
NITRITE: NEGATIVE
Specific Gravity, Urine: 1.025 (ref 1.005–1.030)
Urobilinogen, UA: 1 mg/dL (ref 0.0–1.0)
pH: 6 (ref 5.0–8.0)

## 2014-02-11 LAB — INFLUENZA PANEL BY PCR (TYPE A & B)
H1N1FLUPCR: NOT DETECTED
Influenza A By PCR: NEGATIVE
Influenza B By PCR: NEGATIVE

## 2014-02-11 LAB — I-STAT CG4 LACTIC ACID, ED: LACTIC ACID, VENOUS: 1.75 mmol/L (ref 0.5–2.2)

## 2014-02-11 LAB — BRAIN NATRIURETIC PEPTIDE: B Natriuretic Peptide: 1142 pg/mL — ABNORMAL HIGH (ref 0.0–100.0)

## 2014-02-11 MED ORDER — ONDANSETRON HCL 4 MG PO TABS
4.0000 mg | ORAL_TABLET | Freq: Four times a day (QID) | ORAL | Status: DC | PRN
  Administered 2014-02-12: 4 mg via ORAL
  Filled 2014-02-11: qty 1

## 2014-02-11 MED ORDER — VANCOMYCIN HCL IN DEXTROSE 750-5 MG/150ML-% IV SOLN
750.0000 mg | Freq: Two times a day (BID) | INTRAVENOUS | Status: DC
  Administered 2014-02-12 – 2014-02-23 (×22): 750 mg via INTRAVENOUS
  Filled 2014-02-11 (×28): qty 150

## 2014-02-11 MED ORDER — SODIUM CHLORIDE 0.9 % IJ SOLN
3.0000 mL | Freq: Two times a day (BID) | INTRAMUSCULAR | Status: DC
  Administered 2014-02-11 – 2014-02-17 (×4): 3 mL via INTRAVENOUS

## 2014-02-11 MED ORDER — TRIAMCINOLONE ACETONIDE 0.1 % EX CREA
TOPICAL_CREAM | CUTANEOUS | Status: AC
  Filled 2014-02-11: qty 15

## 2014-02-11 MED ORDER — DEXTROSE 5 % IV SOLN
INTRAVENOUS | Status: AC
  Filled 2014-02-11: qty 1

## 2014-02-11 MED ORDER — SIMVASTATIN 10 MG PO TABS
10.0000 mg | ORAL_TABLET | Freq: Every day | ORAL | Status: DC
  Administered 2014-02-11: 10 mg via ORAL
  Filled 2014-02-11 (×2): qty 1

## 2014-02-11 MED ORDER — VANCOMYCIN HCL 10 G IV SOLR
1500.0000 mg | Freq: Once | INTRAVENOUS | Status: AC
  Administered 2014-02-11: 1500 mg via INTRAVENOUS
  Filled 2014-02-11: qty 1500

## 2014-02-11 MED ORDER — METOPROLOL TARTRATE 25 MG PO TABS
25.0000 mg | ORAL_TABLET | Freq: Two times a day (BID) | ORAL | Status: DC
  Administered 2014-02-11 – 2014-02-26 (×27): 25 mg via ORAL
  Filled 2014-02-11 (×32): qty 1

## 2014-02-11 MED ORDER — ASPIRIN 81 MG PO CHEW
81.0000 mg | CHEWABLE_TABLET | Freq: Every day | ORAL | Status: DC
  Administered 2014-02-13 – 2014-03-04 (×17): 81 mg via ORAL
  Filled 2014-02-11 (×20): qty 1

## 2014-02-11 MED ORDER — ENOXAPARIN SODIUM 40 MG/0.4ML ~~LOC~~ SOLN
40.0000 mg | SUBCUTANEOUS | Status: DC
Start: 2014-02-11 — End: 2014-02-11

## 2014-02-11 MED ORDER — INSULIN ASPART 100 UNIT/ML ~~LOC~~ SOLN
0.0000 [IU] | Freq: Every day | SUBCUTANEOUS | Status: DC
  Administered 2014-02-11 – 2014-02-20 (×4): 2 [IU] via SUBCUTANEOUS
  Administered 2014-02-24: 3 [IU] via SUBCUTANEOUS
  Administered 2014-03-03: 4 [IU] via SUBCUTANEOUS

## 2014-02-11 MED ORDER — CLONIDINE HCL 0.1 MG/24HR TD PTWK
0.1000 mg | MEDICATED_PATCH | TRANSDERMAL | Status: DC
  Administered 2014-02-12: 0.1 mg via TRANSDERMAL
  Filled 2014-02-11: qty 1

## 2014-02-11 MED ORDER — TRIAMCINOLONE ACETONIDE 0.1 % EX OINT
TOPICAL_OINTMENT | CUTANEOUS | Status: AC
  Filled 2014-02-11: qty 15

## 2014-02-11 MED ORDER — CLONIDINE HCL 0.1 MG/24HR TD PTWK: 0.1000 mg | MEDICATED_PATCH | TRANSDERMAL | Status: DC

## 2014-02-11 MED ORDER — IPRATROPIUM-ALBUTEROL 0.5-2.5 (3) MG/3ML IN SOLN
3.0000 mL | Freq: Once | RESPIRATORY_TRACT | Status: AC
  Administered 2014-02-11: 3 mL via RESPIRATORY_TRACT
  Filled 2014-02-11: qty 3

## 2014-02-11 MED ORDER — ONDANSETRON HCL 4 MG/2ML IJ SOLN: 4.0000 mg | Freq: Four times a day (QID) | INTRAMUSCULAR | Status: DC | PRN

## 2014-02-11 MED ORDER — SODIUM CHLORIDE 0.9 % IV SOLN
INTRAVENOUS | Status: DC
  Administered 2014-02-11 – 2014-02-13 (×4): via INTRAVENOUS

## 2014-02-11 MED ORDER — GABAPENTIN 300 MG PO CAPS
300.0000 mg | ORAL_CAPSULE | Freq: Three times a day (TID) | ORAL | Status: DC
  Administered 2014-02-11 – 2014-02-23 (×30): 300 mg via ORAL
  Filled 2014-02-11 (×40): qty 1

## 2014-02-11 MED ORDER — INSULIN ASPART 100 UNIT/ML ~~LOC~~ SOLN
0.0000 [IU] | Freq: Three times a day (TID) | SUBCUTANEOUS | Status: DC
  Administered 2014-02-12 – 2014-02-13 (×3): 4 [IU] via SUBCUTANEOUS
  Administered 2014-02-13 – 2014-02-17 (×7): 3 [IU] via SUBCUTANEOUS
  Administered 2014-02-18: 4 [IU] via SUBCUTANEOUS
  Administered 2014-02-18 – 2014-02-21 (×3): 3 [IU] via SUBCUTANEOUS
  Administered 2014-02-22 (×2): 4 [IU] via SUBCUTANEOUS
  Administered 2014-02-23: 3 [IU] via SUBCUTANEOUS
  Administered 2014-02-23 – 2014-02-24 (×4): 4 [IU] via SUBCUTANEOUS
  Administered 2014-02-25 – 2014-02-26 (×3): 3 [IU] via SUBCUTANEOUS
  Administered 2014-02-26 – 2014-03-02 (×4): 4 [IU] via SUBCUTANEOUS
  Administered 2014-03-03 – 2014-03-04 (×4): 3 [IU] via SUBCUTANEOUS

## 2014-02-11 MED ORDER — IOHEXOL 350 MG/ML SOLN
100.0000 mL | Freq: Once | INTRAVENOUS | Status: AC | PRN
Start: 2014-02-11 — End: 2014-02-11
  Administered 2014-02-11: 100 mL via INTRAVENOUS

## 2014-02-11 MED ORDER — FERROUS SULFATE 325 (65 FE) MG PO TABS
325.0000 mg | ORAL_TABLET | Freq: Two times a day (BID) | ORAL | Status: DC
  Administered 2014-02-11 – 2014-02-26 (×26): 325 mg via ORAL
  Filled 2014-02-11 (×35): qty 1

## 2014-02-11 MED ORDER — ACETAMINOPHEN 325 MG PO TABS
650.0000 mg | ORAL_TABLET | Freq: Once | ORAL | Status: AC
  Administered 2014-02-11: 650 mg via ORAL
  Filled 2014-02-11: qty 2

## 2014-02-11 MED ORDER — DEXTROSE 5 % IV SOLN
2.0000 g | Freq: Once | INTRAVENOUS | Status: AC
  Administered 2014-02-11: 2 g via INTRAVENOUS
  Filled 2014-02-11: qty 2

## 2014-02-11 MED ORDER — IPRATROPIUM-ALBUTEROL 0.5-2.5 (3) MG/3ML IN SOLN: 3.0000 mL | Freq: Once | RESPIRATORY_TRACT | Status: DC

## 2014-02-11 MED ORDER — IPRATROPIUM-ALBUTEROL 0.5-2.5 (3) MG/3ML IN SOLN
3.0000 mL | RESPIRATORY_TRACT | Status: DC | PRN
  Administered 2014-02-14: 3 mL via RESPIRATORY_TRACT
  Filled 2014-02-11: qty 3

## 2014-02-11 MED ORDER — LISINOPRIL 5 MG PO TABS
5.0000 mg | ORAL_TABLET | Freq: Every day | ORAL | Status: DC
  Filled 2014-02-11: qty 1

## 2014-02-11 MED ORDER — SODIUM CHLORIDE 0.9 % IV BOLUS (SEPSIS)
1000.0000 mL | Freq: Once | INTRAVENOUS | Status: AC
  Administered 2014-02-11: 1000 mL via INTRAVENOUS

## 2014-02-11 MED ORDER — TRAMADOL HCL 50 MG PO TABS
50.0000 mg | ORAL_TABLET | Freq: Four times a day (QID) | ORAL | Status: DC | PRN
  Administered 2014-02-11 – 2014-02-23 (×13): 50 mg via ORAL
  Filled 2014-02-11 (×13): qty 1

## 2014-02-11 MED ORDER — LISINOPRIL 5 MG PO TABS: 5.0000 mg | ORAL_TABLET | Freq: Every day | ORAL | Status: DC

## 2014-02-11 MED ORDER — DEXTROSE 5 % IV SOLN
1.0000 g | Freq: Three times a day (TID) | INTRAVENOUS | Status: DC
  Administered 2014-02-11 – 2014-02-13 (×4): 1 g via INTRAVENOUS
  Filled 2014-02-11 (×7): qty 1

## 2014-02-11 MED ORDER — ACETAMINOPHEN 500 MG PO TABS
1000.0000 mg | ORAL_TABLET | Freq: Four times a day (QID) | ORAL | Status: DC | PRN
  Administered 2014-02-11 – 2014-03-03 (×9): 1000 mg via ORAL
  Filled 2014-02-11 (×9): qty 2

## 2014-02-11 MED ORDER — TRIAMCINOLONE ACETONIDE 0.1 % EX CREA
1.0000 "application " | TOPICAL_CREAM | Freq: Two times a day (BID) | CUTANEOUS | Status: DC
  Administered 2014-02-11 – 2014-03-04 (×29): 1 via TOPICAL
  Filled 2014-02-11 (×6): qty 15

## 2014-02-11 MED ORDER — INSULIN DETEMIR 100 UNIT/ML ~~LOC~~ SOLN
50.0000 [IU] | Freq: Every day | SUBCUTANEOUS | Status: DC
  Administered 2014-02-11 – 2014-02-18 (×8): 50 [IU] via SUBCUTANEOUS
  Filled 2014-02-11 (×13): qty 0.5

## 2014-02-11 MED ORDER — VANCOMYCIN HCL IN DEXTROSE 750-5 MG/150ML-% IV SOLN
INTRAVENOUS | Status: AC
  Filled 2014-02-11: qty 150

## 2014-02-11 MED ORDER — ENOXAPARIN SODIUM 40 MG/0.4ML ~~LOC~~ SOLN
40.0000 mg | SUBCUTANEOUS | Status: DC
  Administered 2014-02-11: 40 mg via SUBCUTANEOUS
  Filled 2014-02-11: qty 0.4

## 2014-02-11 NOTE — ED Notes (Signed)
Respiratory at bedside administering breathing treatment and placing patient on Bi-Pap.

## 2014-02-11 NOTE — ED Notes (Signed)
Patient sitting up on side of bed with urinal. Upon placing patient back in bed, O2 saturation 84% on 4L oxygen. Advised Dr Elesa MassedWard. Verbal order to place patient on non-rebreather. Patient O2 saturation increased to 99% on non-rebreather. Patient tolerating well.

## 2014-02-11 NOTE — ED Provider Notes (Signed)
TIME SEEN: 2:20 PM  CHIEF COMPLAINT: Fever, hypoxia  HPI: Pt is a 63 y.o. M with history of coronary artery disease status post CABG, hyperlipidemia, diabetes, hypertension, sick sinus syndrome status post pacemaker, recently underwent femoral bypass x 2 with vascular surgery in November who presents emergency department with fever. Fever started today and he has had chills. States he has no headache, neck pain or neck stiffness, chest pain, lower extremity pain. No vomiting or diarrhea. No rhinorrhea, sore throat. Does have mild dry cough, as had some intermittent shortness of breath. Does not wear oxygen at home. No prior history of COPD but was a previous smoker. Denies sick contacts or recent travel. No numbness, tingling or weakness of his lower extremities.  Denies any pain in his lower extremities.  ROS: See HPI Constitutional:  fever  Eyes: no drainage  ENT: no runny nose   Cardiovascular:  no chest pain  Resp: no SOB  GI: no vomiting GU: no dysuria Integumentary: no rash  Allergy: no hives  Musculoskeletal: no leg swelling  Neurological: no slurred speech ROS otherwise negative  PAST MEDICAL HISTORY/PAST SURGICAL HISTORY:  Past Medical History  Diagnosis Date  . Arteriosclerotic cardiovascular disease (ASCVD)     CABG in 2003 for 3 VD; 07/2007: Stress nuclear-inferior infarction without ischemia; normal EF by echo in 06/2005  . Diabetes mellitus type II   . Hyperlipidemia   . Degenerative joint disease     Left knee  . Cerebrovascular disease 2003    Carotid ultrasound in 2010: Patent right CEA site, 60-79% left internal carotid artery  . Peripheral vascular disease 2007    aortobifemoral BPG-2007  . Anemia   . Peripheral neuropathy   . Hypertension   . Sick sinus syndrome 1996    permanent pacemaker-1996; generator change in 2010  . Chronic obstructive pulmonary disease   . Obesity   . Obstructive sleep apnea   . Benign prostatic hypertrophy   . Gastroesophageal  reflux disease   . Tobacco abuse, in remission     50 pack years total consumption; Quit in 2007    MEDICATIONS:  Prior to Admission medications   Medication Sig Start Date End Date Taking? Authorizing Provider  aspirin 81 MG tablet Take 1 tablet (81 mg total) by mouth daily. 05/08/13  Yes Salley ScarletKawanta F [place], MD  ferrous sulfate 325 (65 FE) MG tablet Take 325 mg by mouth 2 (two) times daily. 02/01/14  Yes Historical Provider, MD  gabapentin (NEURONTIN) 300 MG capsule Take 1 capsule (300 mg total) by mouth 3 (three) times daily. 06/21/13  Yes Salley ScarletKawanta F [place], MD  Insulin Detemir (LEVEMIR FLEXTOUCH) 100 UNIT/ML Pen Inject 45 Units into the skin daily at 10 pm. Patient taking differently: Inject 50 Units into the skin daily at 10 pm.  10/24/13  Yes Salley ScarletKawanta F [place], MD  insulin lispro (HUMALOG) 100 UNIT/ML KiwkPen Inject 12 Units into the skin 3 (three) times daily with meals. 06/24/13  Yes Salley ScarletKawanta F [place], MD  lisinopril (PRINIVIL,ZESTRIL) 5 MG tablet Take 5 mg by mouth daily. 02/01/14  Yes Historical Provider, MD  metoprolol (LOPRESSOR) 50 MG tablet Take 25 mg by mouth 2 (two) times daily. 02/01/14 02/16/14 Yes Historical Provider, MD  simvastatin (ZOCOR) 10 MG tablet Take 1 tablet (10 mg total) by mouth at bedtime. 06/21/13  Yes Salley ScarletKawanta F [place], MD  sulfamethoxazole-trimethoprim (BACTRIM DS,SEPTRA DS) 800-160 MG per tablet Take 1 tablet by mouth 2 (two) times daily. 02/10/14  Yes Salley ScarletKawanta F [place], MD  traMADol (ULTRAM) 50 MG tablet Take by mouth every 6 (six) hours as needed.   Yes Historical Provider, MD  triamcinolone cream (KENALOG) 0.1 % Apply 1 application topically 2 (two) times daily. Apply to leg 08/28/13  Yes Salley Scarlet, MD  acetaminophen (TYLENOL) 500 MG tablet Take 2 tablets (1,000 mg total) by mouth every 6 (six) hours as needed. Pain. 05/08/13   Salley Scarlet, MD  albuterol (PROVENTIL HFA;VENTOLIN HFA) 108 (90 BASE) MCG/ACT inhaler Inhale 2 puffs into the lungs every 4 (four) hours  as needed. 06/21/13   Salley Scarlet, MD  BAYER CONTOUR NEXT TEST test strip USE TO TEST BLOOD SUGAR LEVELS THREE TIMES DAILY 02/03/14   Salley Scarlet, MD  cloNIDine (CATAPRES - DOSED IN MG/24 HR) 0.1 mg/24hr patch Place 0.1 mg onto the skin once a week.    Historical Provider, MD  metoprolol tartrate (LOPRESSOR) 25 MG tablet Take 1 tablet (25 mg total) by mouth 2 (two) times daily. Patient not taking: Reported on 02/11/2014 06/21/13   Salley Scarlet, MD    ALLERGIES:  Allergies  Allergen Reactions  . Morphine And Related     Pt felt flushed and was diaphoretic     SOCIAL HISTORY:  History  Substance Use Topics  . Smoking status: Former Smoker -- 2.00 packs/day for 25 years    Types: Cigarettes    Quit date: 01/22/2005  . Smokeless tobacco: Former Neurosurgeon    Quit date: 02/16/1996  . Alcohol Use: No    FAMILY HISTORY: Family History  Problem Relation Age of Onset  . Dementia Mother   . Heart attack Father   . Coronary artery disease Father   . Peripheral vascular disease Sister   . Diabetes Brother   . Peripheral vascular disease Brother   . Hypertension Brother   . Coronary artery disease Brother     EXAM: BP 209/70 mmHg  Pulse 76  Temp(Src) 104.4 F (40.2 C) (Rectal)  Resp 24  Ht 5\' 4"  (1.626 m)  Wt 198 lb (89.812 kg)  BMI 33.97 kg/m2  SpO2 88% CONSTITUTIONAL: Alert and oriented and responds appropriately to questions. Well-appearing; well-nourished HEAD: Normocephalic EYES: Conjunctivae clear, PERRL ENT: normal nose; no rhinorrhea; moist mucous membranes; pharynx without lesions noted, no tonsillar hypertrophy or exudate, no trismus or drooling, no uvular deviation, no muffled for race NECK: Supple, no meningismus, no LAD  CARD: Regular and tachycardic with any movement heart rate jumped in the 120s; S1 and S2 appreciated; no murmurs, no clicks, no rubs, no gallops RESP: Normal chest excursion without splinting or tachypnea; breath sounds clear and equal  bilaterally; no wheezes, no rhonchi, no rales, patient is hypoxic with a new oxygen requirement, sats 88% on room air at rest ABD/GI: Normal bowel sounds; non-distended; soft, non-tender, no rebound, no guarding BACK:  The back appears normal and is non-tender to palpation, there is no CVA tenderness EXT: 2+ femoral pulses bilaterally, patient has a healing surgical wound noted in bilateral inguinal regions without signs of erythema or warmth or induration or fluctuance, Normal ROM in all joints; non-tender to palpation; no edema; normal capillary refill; no cyanosis; no joint effusion, no calf tenderness or swelling, 1+ palpable DP pulses bilaterally, extremity is warm and well-perfused   SKIN: Normal color for age and race; warm NEURO: Moves all extremities equally; he reports normal sensation diffusely PSYCH: The patient's mood and manner are appropriate. Grooming and personal hygiene are appropriate.  MEDICAL DECISION MAKING: Patient here with  fever, hypoxia, tachycardia. We'll obtain labs, cultures, chest x-ray and urine. Concern for possible clinical pneumonia. Start broad-spectrum antibiotics and give IV fluids.  ED PROGRESS: Patient's labs show leukocytosis of 17 with left shift. Urine shows hematuria but no other sign of infection. Chest x-ray clear. Given his new onset hypoxia and new oxygen requirement with clinical concern for pneumonia, will obtain a CT of his chest. Lactate normal. Blood pressure normal. Patient will need admission as he meets SIRS criteria.    CT of his chest shows no pulmonary embolus are obvious infiltrate. There is however left lower lobe atelectasis but no effusion.  Attempted to ambulate patient to the bathroom but he becomes extremely tachycardic and tachypneic with heart rate in the 130s, respiratory rate in the 40s and he is unable to ambulate to the restroom. Sats dropped to 85% on 2 L nasal cannula. He now has some bibasilar crackles and expiratory wheezing.  We'll hold IV fluids and give DuoNeb treatment. I reexamined patient and see no sign of cellulitis, no meningismus on exam and no other sign of infectious etiology. I'm still concerned the patient may have underlying pneumonia. Influenza is pending. We'll discuss with medicine for admission for septic workup.   6:00 PM  Dw/ Dr. Karilyn CotaGosrani.  Will admit to step down. Will obtain BNP, ABG. We'll start BiPAP.   CRITICAL CARE Performed by: Raelyn NumberWARD, Shreena Baines N   Total critical care time: 45 minutes  Critical care time was exclusive of separately billable procedures and treating other patients.  Critical care was necessary to treat or prevent imminent or life-threatening deterioration.  Critical care was time spent personally by me on the following activities: development of treatment plan with patient and/or surrogate as well as nursing, discussions with consultants, evaluation of patient's response to treatment, examination of patient, obtaining history from patient or surrogate, ordering and performing treatments and interventions, ordering and review of laboratory studies, ordering and review of radiographic studies, pulse oximetry and re-evaluation of patient's condition.   Layla MawKristen N Shailee Foots, DO 02/11/14 1801

## 2014-02-11 NOTE — ED Notes (Addendum)
Pt sent by home health nurse for fever and chills x1 hour, pt's nurse states the pt has an oral temp of 104. Pt denies pain at this time. Pt had a femoral bypass 1-2 weeks ago.

## 2014-02-11 NOTE — ED Notes (Signed)
Pt went to radiology.

## 2014-02-11 NOTE — Telephone Encounter (Signed)
Patients wife is calling to say that antibiotic dr Jeanice Limdurham prescribed is making him sick and they had to call the ambulance  [phone]

## 2014-02-11 NOTE — Telephone Encounter (Signed)
noted 

## 2014-02-11 NOTE — Telephone Encounter (Signed)
Call placed to patient wife.   Reports that patient has temperature of 102 at this time.   Reports that EMS is with patient at this time and is taking him to ER for eval.

## 2014-02-11 NOTE — Progress Notes (Signed)
ANTIBIOTIC CONSULT NOTE - FOLLOW UP  Pharmacy Consult for Vancomycin and Cefepime  Indication: rule out sepsis  Allergies  Allergen Reactions  . Morphine And Related     Pt felt flushed and was diaphoretic     Patient Measurements: Height: 5\' 4"  (162.6 cm) Weight: 198 lb (89.812 kg) IBW/kg (Calculated) : 59.2  Vital Signs: Temp: 101.4 F (38.6 C) (12/29 1818) Temp Source: Oral (12/29 1817) BP: 171/62 mmHg (12/29 1800) Pulse Rate: 85 (12/29 1818) Intake/Output from previous day:   Intake/Output from this shift:    Labs:  Recent Labs  02/10/14 1206 02/11/14 1429  WBC 5.4 17.0*  HGB 10.7* 10.9*  PLT 297 293  CREATININE 1.12 1.36*   Estimated Creatinine Clearance: 55.4 mL/min (by C-G formula based on Cr of 1.36). No results for input(s): VANCOTROUGH, VANCOPEAK, VANCORANDOM, GENTTROUGH, GENTPEAK, GENTRANDOM, TOBRATROUGH, TOBRAPEAK, TOBRARND, AMIKACINPEAK, AMIKACINTROU, AMIKACIN in the last 72 hours.   Microbiology: No results found for this or any previous visit (from the past 720 hour(s)).  Anti-infectives    Start     Dose/Rate Route Frequency Ordered Stop   02/11/14 1545  vancomycin (VANCOCIN) 1,500 mg in sodium chloride 0.9 % 500 mL IVPB     1,500 mg250 mL/hr over 120 Minutes Intravenous  Once 02/11/14 1537 02/11/14 1940   02/11/14 1545  ceFEPIme (MAXIPIME) 2 g in dextrose 5 % 50 mL IVPB     2 g100 mL/hr over 30 Minutes Intravenous  Once 02/11/14 1537 02/11/14 1642     Assessment: Okay for Protocol, Pt admitted with Acute respiratory failure with hypoxemia, likely has pneumonia vs. Influenza.  Initial doses given in ED  Goal of Therapy:  Vancomycin trough level 15-20 mcg/ml  Plan:  Cefepime 1gm IV every 8 hours. Vancomycin 750mg  IV every 12 hours. Measure antibiotic drug levels at steady state Follow up culture results  Mady GemmaHayes, Virda Betters R 02/11/2014,8:23 PM

## 2014-02-11 NOTE — Progress Notes (Signed)
Pharmacy Note:  Initial antibiotics for Vancomycin and Cefepime ordered by EDP for Sepsis.  Estimated Creatinine Clearance: 67.3 mL/min (by C-G formula based on Cr of 1.12).   Allergies  Allergen Reactions  . Morphine And Related     Pt felt flushed and was diaphoretic     Filed Vitals:   02/11/14 1343  BP:   Pulse:   Temp: 104.4 F (40.2 C)  Resp:     Anti-infectives    None      Plan: Initial doses of Vancomycin 1500mg  and Cefepime 2gm X 1 ordered. F/U admission orders for further dosing if therapy continued.  Mady GemmaHayes, Erskine Steinfeldt R, [facility] 02/11/2014 3:36 PM

## 2014-02-11 NOTE — H&P (Signed)
[facility] History and Physical  Collin LeitzWilliam F Amsden ZOX:[id number] DOB: 05/08/1950 DOA: 02/11/2014  Referring physician: ER PCP: [name], [name], MD   Chief Complaint: Fever, dyspnea  HPI: [name] is a 63 y.o. male  This is a 63 year old man who is in his usual state of health yesterday but this morning he became feverish and more short of breath. He has had chills. He denies any headache, neck pain, chest pain. He does have a cough but cannot tell me what color it is productive of. He does not have home oxygen. He does have a diagnosis of COPD, he quit smoking in 2007. He denies any sick contacts. He had femoral bypass with vascular surgery November and when he was seen by his primary care physician it was felt that there was nonhealing groin wounds and scrotal swelling. Patient denies any abdominal pain, scrotal pain, nausea. Evaluation in the emergency room showed that he was hypoxic on room air and was finding it hard to breathe. He was eventually started on BiPAP and he feels more comfortable now. CT angiogram of the chest does not show any evidence of PE and there is no evidence of pneumonia. He is now being admitted for further management of his acute respiratory failure and hypoxemia.   Review of Systems:  Apart from symptoms above, all other systems negative.  Past Medical History  Diagnosis Date  . Arteriosclerotic cardiovascular disease (ASCVD)     CABG in 2003 for 3 VD; 07/2007: Stress nuclear-inferior infarction without ischemia; normal EF by echo in 06/2005  . Diabetes mellitus type II   . Hyperlipidemia   . Degenerative joint disease     Left knee  . Cerebrovascular disease 2003    Carotid ultrasound in 2010: Patent right CEA site, 60-79% left internal carotid artery  . Peripheral vascular disease 2007    aortobifemoral BPG-2007  . Anemia   . Peripheral neuropathy   . Hypertension   . Sick sinus syndrome 1996    permanent pacemaker-1996; generator change  in 2010  . Chronic obstructive pulmonary disease   . Obesity   . Obstructive sleep apnea   . Benign prostatic hypertrophy   . Gastroesophageal reflux disease   . Tobacco abuse, in remission     50 pack years total consumption; Quit in 2007   Past Surgical History  Procedure Laterality Date  . Coronary artery bypass graft  2003    three-vessel disease  . Iliac artery stent      Percutaneous intervention-right external iliac; unclear whether stent was placed  . Pacemaker insertion  1996    generator change in 2010  . Carotid endarterectomy  2003    Right  . Pacemaker insertion      Generator replaced in 04/2008  . Aorta - bilateral femoral artery bypass graft  2007  . Femoral-popliteal bypass graft  1998    bilateral  . Colonoscopy  2012    Negative screening study.  . Ileocolonoscopy  02/20/2007    WJX:BJYNWGRMR:Normal rectum, colon, and terminal ileum  . Femoral-femoral bypass graft Bilateral 01/03/2014    Procedure: BYPASS GRAFT RIGHT FEMORAL-LEFT FEMORAL ARTERY ;  Surgeon: Chuck Hinthristopher S Dickson, MD;  [facility];  Service: Vascular;  Laterality: Bilateral;  . Endarterectomy femoral Left 01/03/2014    Procedure: ENDARTERECTOMY FEMORAL;  Surgeon: Chuck Hinthristopher S Dickson, MD;  [facility];  Service: Vascular;  Laterality: Left;  . Intraoperative arteriogram Left 01/03/2014    Procedure: INTRA OPERATIVE ARTERIOGRAM, LEFT LOWER LEG;  Surgeon: Chuck Hint, MD;  Location: [facility] OR;  Service: Vascular;  Laterality: Left;  . Abdominal angiogram N/A 01/02/2014    Procedure: ABDOMINAL ANGIOGRAM;  Surgeon: Fransisco Hertz, MD;  Location: [facility] CATH LAB;  Service: Cardiovascular;  Laterality: N/A;  . Peripheral vascular catheterization  01/02/2014    Procedure: LOWER EXTREMITY ANGIOGRAPHY;  Surgeon: Fransisco Hertz, MD;  Location: [facility] CATH LAB;  Service: Cardiovascular;;  . Arch aortogram  01/02/2014    Procedure: ARCH AORTOGRAM;  Surgeon: Fransisco Hertz, MD;  Location: [facility] CATH LAB;  Service:  Cardiovascular;;   Social History:  reports that he quit smoking about 9 years ago. His smoking use included Cigarettes. He has a 50 pack-year smoking history. He quit smokeless tobacco use about 18 years ago. He reports that he does not drink alcohol or use illicit drugs.  Allergies  Allergen Reactions  . Morphine And Related     Pt felt flushed and was diaphoretic     Family History  Problem Relation Age of Onset  . Dementia Mother   . Heart attack Father   . Coronary artery disease Father   . Peripheral vascular disease Sister   . Diabetes Brother   . Peripheral vascular disease Brother   . Hypertension Brother   . Coronary artery disease Brother      Prior to Admission medications   Medication Sig Start Date End Date Taking? Authorizing Provider  aspirin 81 MG tablet Take 1 tablet (81 mg total) by mouth daily. 05/08/13  Yes Salley Scarlet, MD  ferrous sulfate 325 (65 FE) MG tablet Take 325 mg by mouth 2 (two) times daily. 02/01/14  Yes Historical Provider, MD  gabapentin (NEURONTIN) 300 MG capsule Take 1 capsule (300 mg total) by mouth 3 (three) times daily. 06/21/13  Yes Salley Scarlet, MD  Insulin Detemir (LEVEMIR FLEXTOUCH) 100 UNIT/ML Pen Inject 45 Units into the skin daily at 10 pm. Patient taking differently: Inject 50 Units into the skin daily at 10 pm.  10/24/13  Yes Salley Scarlet, MD  insulin lispro (HUMALOG) 100 UNIT/ML KiwkPen Inject 12 Units into the skin 3 (three) times daily with meals. 06/24/13  Yes Salley Scarlet, MD  lisinopril (PRINIVIL,ZESTRIL) 5 MG tablet Take 5 mg by mouth daily. 02/01/14  Yes Historical Provider, MD  metoprolol (LOPRESSOR) 50 MG tablet Take 25 mg by mouth 2 (two) times daily. 02/01/14 02/16/14 Yes Historical Provider, MD  simvastatin (ZOCOR) 10 MG tablet Take 1 tablet (10 mg total) by mouth at bedtime. 06/21/13  Yes Salley Scarlet, MD  sulfamethoxazole-trimethoprim (BACTRIM DS,SEPTRA DS) 800-160 MG per tablet Take 1 tablet by mouth 2 (two)  times daily. 02/10/14  Yes Salley Scarlet, MD  traMADol (ULTRAM) 50 MG tablet Take by mouth every 6 (six) hours as needed.   Yes Historical Provider, MD  triamcinolone cream (KENALOG) 0.1 % Apply 1 application topically 2 (two) times daily. Apply to leg 08/28/13  Yes Salley Scarlet, MD  acetaminophen (TYLENOL) 500 MG tablet Take 2 tablets (1,000 mg total) by mouth every 6 (six) hours as needed. Pain. 05/08/13   Salley Scarlet, MD  albuterol (PROVENTIL HFA;VENTOLIN HFA) 108 (90 BASE) MCG/ACT inhaler Inhale 2 puffs into the lungs every 4 (four) hours as needed. 06/21/13   Salley Scarlet, MD  BAYER CONTOUR NEXT TEST test strip USE TO TEST BLOOD SUGAR LEVELS THREE TIMES DAILY 02/03/14   Salley Scarlet, MD  cloNIDine (CATAPRES - DOSED IN MG/24  HR) 0.1 mg/24hr patch Place 0.1 mg onto the skin once a week.    Historical Provider, MD  metoprolol tartrate (LOPRESSOR) 25 MG tablet Take 1 tablet (25 mg total) by mouth 2 (two) times daily. Patient not taking: Reported on 02/11/2014 06/21/13   Salley Scarlet, MD   Physical Exam: Filed Vitals:   02/11/14 1800 02/11/14 1809 02/11/14 1817 02/11/14 1818  BP: 171/62     Pulse: 96   85  Temp:   101.4 F (38.6 C) 101.4 F (38.6 C)  TempSrc:   Oral   Resp: 47   38  Height:      Weight:      SpO2: 100% 100%  100%    Wt Readings from Last 3 Encounters:  02/11/14 89.812 kg (198 lb)  02/10/14 89.812 kg (198 lb)  01/22/14 91.173 kg (201 lb)    General:  Appears calm and comfortable on BiPAP. He is not toxic or septic. He is febrile. Eyes: PERRL, normal lids, irises & conjunctiva ENT: grossly normal hearing, lips & tongue Neck: no LAD, masses or thyromegaly Cardiovascular: RRR, no m/r/g. No LE edema. Telemetry: SR, no arrhythmias  Respiratory: Few crackles at both bases more on the left than the right. There is no bronchial breathing or wheezing. Abdomen: soft, ntnd Skin: no rash or induration seen on limited exam Musculoskeletal: grossly normal  tone BUE/BLE Psychiatric: grossly normal mood and affect, speech fluent and appropriate Neurologic: grossly non-focal. alert and orientated in time place and person.           Labs on Admission:  Basic Metabolic Panel:  Recent Labs Lab 02/10/14 1206 02/11/14 1429  NA 139 137  K 4.3 4.1  CL 102 102  CO2 28 28  GLUCOSE 234* 235*  BUN 10 10  CREATININE 1.12 1.36*  CALCIUM 9.6 9.6   Liver Function Tests:  Recent Labs Lab 02/10/14 1206 02/11/14 1429  AST 14 15  ALT 12 15  ALKPHOS 63 61  BILITOT 0.7 1.1  PROT 6.9 7.7  ALBUMIN 3.6 3.6   No results for input(s): LIPASE, AMYLASE in the last 168 hours. No results for input(s): AMMONIA in the last 168 hours. CBC:  Recent Labs Lab 02/10/14 1206 02/11/14 1429  WBC 5.4 17.0*  NEUTROABS 3.0 15.3*  HGB 10.7* 10.9*  HCT 34.0* 35.0*  MCV 84.6 87.3  PLT 297 293   Cardiac Enzymes: No results for input(s): CKTOTAL, CKMB, CKMBINDEX, TROPONINI in the last 168 hours.  BNP (last 3 results) No results for input(s): PROBNP in the last 8760 hours. CBG: No results for input(s): GLUCAP in the last 168 hours.  Radiological Exams on Admission: Dg Chest 2 View  02/11/2014   CLINICAL DATA:  Fever.  Hypoxia.  EXAM: CHEST  2 VIEW  COMPARISON:  07/15/2005 and 07/10/2005  FINDINGS: Pacemaker in place. Previous median sternotomy. There is slight pulmonary vascular congestion but overall heart size is normal. The hilar structures are prominent, right more than left but I feel this is due to the vascular congestion. No effusions. No acute osseous abnormality.  IMPRESSION: Slight pulmonary vascular congestion.   Electronically Signed   By: Geanie [name] M.D.   On: 02/11/2014 15:53   Ct Angio Chest Pe W/cm &/or Wo Cm  02/11/2014   CLINICAL DATA:  Short of breath and hypoxia.  Chills and fever  EXAM: CT ANGIOGRAPHY CHEST WITH CONTRAST  TECHNIQUE: Multidetector CT imaging of the chest was performed using the standard protocol during bolus  administration of intravenous contrast. Multiplanar CT image reconstructions and MIPs were obtained to evaluate the vascular anatomy.  CONTRAST:  100mL OMNIPAQUE IOHEXOL 350 MG/ML SOLN  COMPARISON:  None.  FINDINGS: Mediastinum: There is moderate cardiac enlargement. The patient is status post median sternotomy and CABG procedure. No pericardial effusion identified. The trachea appears patent and is midline. Normal appearance of the esophagus. Prominent AP window lymph node measures 1.4 cm, image number 35/series 6. The main pulmonary artery appears patent. No lobar or segmental pulmonary artery filling defects to suggest a clinically significant pulmonary embolus.  Lungs/Pleura: No airspace consolidation. Mild lower lobe predominant dependent changes are identified including subsegmental atelectasis.  Upper Abdomen: The visualized portions of the liver and spleen are normal. The adrenal glands are unremarkable. The visualized portions of the gallbladder are also normal.  Musculoskeletal: Mild multi level degenerative disc disease identified within the thoracic spine. There is a curvature of the thoracic spine which is convex towards the right.  Review of the MIP images confirms the above findings.  IMPRESSION: 1. No evidence for acute pulmonary embolus. 2. Cardiac enlargement status post CABG procedure.   Electronically Signed   By: Signa Kellaylor  Stroud M.D.   On: 02/11/2014 17:21      Assessment/Plan   1. Acute respiratory failure with hypoxemia-anterior blood gas on oxygen still shows hypoxemia, fortunately there is no hypercapnia. Chest x-ray and CT angiogram of the chest did not show presence of pneumonia/consolidation. On clinical grounds, he likely has pneumonia. He also may have influenza and we will test for this. We will treat him empirically with antibiotics and ask pulmonology to see him in consultation. 2. Hypertension-continue to monitor. 3. Diabetes, uncontrolled-continue with insulin and sliding  scale of insulin. 4. COPD-not clinically convinced that he is having an exacerbation of his COPD and will not give him steroids for the time being. 5. Obesity..  Further recommendations will depend on patient's hospital progress.  Code Status: Full code   DVT Prophylaxis: Lovenox  Family Communication: I discussed the plan with the patient at the bedside.   Disposition Plan: Home when medically stable.   Time spent: 60 minutes  Bertel Venard C [facility] Pager [phone].

## 2014-02-12 ENCOUNTER — Other Ambulatory Visit (HOSPITAL_COMMUNITY): Payer: Medicare HMO

## 2014-02-12 ENCOUNTER — Inpatient Hospital Stay (HOSPITAL_COMMUNITY): Payer: Medicare HMO

## 2014-02-12 ENCOUNTER — Ambulatory Visit (HOSPITAL_COMMUNITY): Admission: RE | Admit: 2014-02-12 | Payer: Medicare HMO | Source: Ambulatory Visit

## 2014-02-12 DIAGNOSIS — I214 Non-ST elevation (NSTEMI) myocardial infarction: Secondary | ICD-10-CM

## 2014-02-12 DIAGNOSIS — J9601 Acute respiratory failure with hypoxia: Secondary | ICD-10-CM

## 2014-02-12 DIAGNOSIS — A419 Sepsis, unspecified organism: Secondary | ICD-10-CM

## 2014-02-12 DIAGNOSIS — N183 Chronic kidney disease, stage 3 (moderate): Secondary | ICD-10-CM

## 2014-02-12 DIAGNOSIS — N144 Toxic nephropathy, not elsewhere classified: Secondary | ICD-10-CM

## 2014-02-12 DIAGNOSIS — R7989 Other specified abnormal findings of blood chemistry: Secondary | ICD-10-CM

## 2014-02-12 DIAGNOSIS — E871 Hypo-osmolality and hyponatremia: Secondary | ICD-10-CM

## 2014-02-12 DIAGNOSIS — N179 Acute kidney failure, unspecified: Secondary | ICD-10-CM | POA: Diagnosis present

## 2014-02-12 DIAGNOSIS — R778 Other specified abnormalities of plasma proteins: Secondary | ICD-10-CM | POA: Diagnosis present

## 2014-02-12 DIAGNOSIS — I359 Nonrheumatic aortic valve disorder, unspecified: Secondary | ICD-10-CM

## 2014-02-12 LAB — TROPONIN I
TROPONIN I: 8.01 ng/mL — AB (ref <0.031)
Troponin I: 3.07 ng/mL (ref <0.031)
Troponin I: 2.31 ng/mL (ref <0.031)

## 2014-02-12 LAB — GLUCOSE, CAPILLARY
GLUCOSE-CAPILLARY: 130 mg/dL — AB (ref 70–99)
GLUCOSE-CAPILLARY: 186 mg/dL — AB (ref 70–99)
GLUCOSE-CAPILLARY: 210 mg/dL — AB (ref 70–99)
GLUCOSE-CAPILLARY: 233 mg/dL — AB (ref 70–99)
Glucose-Capillary: 192 mg/dL — ABNORMAL HIGH (ref 70–99)
Glucose-Capillary: 168 mg/dL — ABNORMAL HIGH (ref 70–99)

## 2014-02-12 LAB — CBC
HEMATOCRIT: 31 % — AB (ref 39.0–52.0)
HEMOGLOBIN: 9.6 g/dL — AB (ref 13.0–17.0)
MCH: 26.7 pg (ref 26.0–34.0)
MCHC: 31 g/dL (ref 30.0–36.0)
MCV: 86.4 fL (ref 78.0–100.0)
Platelets: 248 10*3/uL (ref 150–400)
RBC: 3.59 MIL/uL — ABNORMAL LOW (ref 4.22–5.81)
RDW: 13.3 % (ref 11.5–15.5)
WBC: 17.5 10*3/uL — ABNORMAL HIGH (ref 4.0–10.5)

## 2014-02-12 LAB — COMPREHENSIVE METABOLIC PANEL
ALBUMIN: 2.9 g/dL — AB (ref 3.5–5.2)
ALK PHOS: 50 U/L (ref 39–117)
ALT: 16 U/L (ref 0–53)
ANION GAP: 7 (ref 5–15)
AST: 34 U/L (ref 0–37)
BUN: 16 mg/dL (ref 6–23)
CALCIUM: 8.4 mg/dL (ref 8.4–10.5)
CO2: 23 mmol/L (ref 19–32)
Chloride: 102 mEq/L (ref 96–112)
Creatinine, Ser: 1.67 mg/dL — ABNORMAL HIGH (ref 0.50–1.35)
GFR calc Af Amer: 48 mL/min — ABNORMAL LOW (ref >90)
GFR calc non Af Amer: 42 mL/min — ABNORMAL LOW (ref >90)
Glucose, Bld: 188 mg/dL — ABNORMAL HIGH (ref 70–99)
POTASSIUM: 3.8 mmol/L (ref 3.5–5.1)
SODIUM: 132 mmol/L — AB (ref 135–145)
TOTAL PROTEIN: 6.2 g/dL (ref 6.0–8.3)
Total Bilirubin: 1.2 mg/dL (ref 0.3–1.2)

## 2014-02-12 LAB — MRSA PCR SCREENING: MRSA by PCR: NEGATIVE

## 2014-02-12 LAB — URINE CULTURE
COLONY COUNT: NO GROWTH
Culture: NO GROWTH

## 2014-02-12 LAB — PROTIME-INR
INR: 1.17 (ref 0.00–1.49)
PROTHROMBIN TIME: 15 s (ref 11.6–15.2)

## 2014-02-12 LAB — HEPARIN LEVEL (UNFRACTIONATED): Heparin Unfractionated: 0.12 IU/mL — ABNORMAL LOW (ref 0.30–0.70)

## 2014-02-12 LAB — BRAIN NATRIURETIC PEPTIDE: B Natriuretic Peptide: 2605 pg/mL — ABNORMAL HIGH (ref 0.0–100.0)

## 2014-02-12 LAB — HIV ANTIBODY (ROUTINE TESTING W REFLEX): HIV: NONREACTIVE

## 2014-02-12 MED ORDER — SODIUM CHLORIDE 0.9 % IJ SOLN
10.0000 mL | Freq: Two times a day (BID) | INTRAMUSCULAR | Status: DC
  Administered 2014-02-12 – 2014-02-16 (×5): 10 mL
  Administered 2014-02-17: 20 mL
  Administered 2014-02-20: 10 mL
  Administered 2014-02-21: 20 mL

## 2014-02-12 MED ORDER — HEPARIN (PORCINE) IN NACL 100-0.45 UNIT/ML-% IJ SOLN
INTRAMUSCULAR | Status: AC
  Administered 2014-02-12: 04:00:00
  Filled 2014-02-12: qty 250

## 2014-02-12 MED ORDER — ISOSORBIDE MONONITRATE 15 MG HALF TABLET
15.0000 mg | ORAL_TABLET | Freq: Every day | ORAL | Status: DC
  Administered 2014-02-12 – 2014-02-14 (×3): 15 mg via ORAL
  Filled 2014-02-12 (×4): qty 1

## 2014-02-12 MED ORDER — NITROGLYCERIN 2 % TD OINT
1.0000 [in_us] | TOPICAL_OINTMENT | Freq: Four times a day (QID) | TRANSDERMAL | Status: DC
  Administered 2014-02-12 – 2014-02-13 (×4): 1 [in_us] via TOPICAL
  Filled 2014-02-12 (×3): qty 30
  Filled 2014-02-12: qty 1
  Filled 2014-02-12 (×3): qty 30

## 2014-02-12 MED ORDER — ASPIRIN 81 MG PO CHEW
324.0000 mg | CHEWABLE_TABLET | ORAL | Status: AC
  Administered 2014-02-12: 324 mg via ORAL
  Filled 2014-02-12: qty 4

## 2014-02-12 MED ORDER — HEPARIN (PORCINE) IN NACL 100-0.45 UNIT/ML-% IJ SOLN
2050.0000 [IU]/h | INTRAMUSCULAR | Status: DC
  Administered 2014-02-12 (×2): 1100 [IU]/h via INTRAVENOUS
  Administered 2014-02-13: 1600 [IU]/h via INTRAVENOUS
  Administered 2014-02-14: 1900 [IU]/h via INTRAVENOUS
  Filled 2014-02-12 (×7): qty 250

## 2014-02-12 MED ORDER — OSELTAMIVIR PHOSPHATE 75 MG PO CAPS: 75.0000 mg | ORAL_CAPSULE | Freq: Two times a day (BID) | ORAL | Status: DC

## 2014-02-12 MED ORDER — SODIUM CHLORIDE 0.9 % IJ SOLN
10.0000 mL | INTRAMUSCULAR | Status: DC | PRN
  Administered 2014-02-14 – 2014-02-21 (×5): 10 mL
  Filled 2014-02-12 (×5): qty 40

## 2014-02-12 MED ORDER — HEPARIN BOLUS VIA INFUSION
2000.0000 [IU] | Freq: Once | INTRAVENOUS | Status: AC
  Administered 2014-02-12: 2000 [IU] via INTRAVENOUS
  Filled 2014-02-12: qty 2000

## 2014-02-12 MED ORDER — OSELTAMIVIR PHOSPHATE 30 MG PO CAPS
30.0000 mg | ORAL_CAPSULE | Freq: Two times a day (BID) | ORAL | Status: DC
  Administered 2014-02-12 – 2014-02-15 (×6): 30 mg via ORAL
  Filled 2014-02-12 (×11): qty 1

## 2014-02-12 MED ORDER — ATORVASTATIN CALCIUM 80 MG PO TABS
80.0000 mg | ORAL_TABLET | Freq: Every day | ORAL | Status: DC
  Administered 2014-02-13 – 2014-03-04 (×20): 80 mg via ORAL
  Filled 2014-02-12 (×22): qty 1

## 2014-02-12 NOTE — Consult Note (Signed)
Reason for Consult: elevated troponin Referring Physician: Dr. Ernestina Patches Primary cardiologist: Dr. Crissie Sickles [name] is an 63 y.o. male.  HPI: [name] is a 63 yo man with PMH of COPD, CAD s/p CABG '03, right sided pace maker, T2DM on insulin, dyslipidemia and PVD who presented to [facility] with a fever of 103 measured by his home health nurse. He had a troponin of 8 leading to transfer to [facility]. He tells me he had 3 flickers of chest pain on Monday lasting a few seconds but no other pain. No syncope. He has had some increased dyspnea/shortness of breath. He follows with vascular Surgery (Dr. Scot Dock) and had vascular intervention in November for poorly healing leg wounds. He had a negative CTA for PE with no overt evidence of pneumonia but he is empirically being treated for PNA. He's currently on aspirin and heparin gtt.   Past Medical History  Diagnosis Date  . Arteriosclerotic cardiovascular disease (ASCVD)     CABG in 2003 for 3 VD; 07/2007: Stress nuclear-inferior infarction without ischemia; normal EF by echo in 06/2005  . Diabetes mellitus type II   . Hyperlipidemia   . Degenerative joint disease     Left knee  . Cerebrovascular disease 2003    Carotid ultrasound in 2010: Patent right CEA site, 60-79% left internal carotid artery  . Peripheral vascular disease 2007    aortobifemoral BPG-2007  . Anemia   . Peripheral neuropathy   . Hypertension   . Sick sinus syndrome 1996    permanent pacemaker-1996; generator change in 2010  . Chronic obstructive pulmonary disease   . Obesity   . Obstructive sleep apnea   . Benign prostatic hypertrophy   . Gastroesophageal reflux disease   . Tobacco abuse, in remission     50 pack years total consumption; Quit in 2007    Past Surgical History  Procedure Laterality Date  . Coronary artery bypass graft  2003    three-vessel disease  . Iliac artery stent      Percutaneous intervention-right external iliac; unclear whether  stent was placed  . Pacemaker insertion  1996    generator change in 2010  . Carotid endarterectomy  2003    Right  . Pacemaker insertion      Generator replaced in 04/2008  . Aorta - bilateral femoral artery bypass graft  2007  . Femoral-popliteal bypass graft  1998    bilateral  . Colonoscopy  2012    Negative screening study.  . Ileocolonoscopy  02/20/2007    UJW:[id number] rectum, Collin, and terminal ileum  . Femoral-femoral bypass graft Bilateral 01/03/2014    Procedure: BYPASS GRAFT RIGHT FEMORAL-LEFT FEMORAL ARTERY ;  Surgeon: Angelia Mould, MD;  Location: [place];  Service: Vascular;  Laterality: Bilateral;  . Endarterectomy femoral Left 01/03/2014    Procedure: ENDARTERECTOMY FEMORAL;  Surgeon: Angelia Mould, MD;  Location: [place];  Service: Vascular;  Laterality: Left;  . Intraoperative arteriogram Left 01/03/2014    Procedure: INTRA OPERATIVE ARTERIOGRAM, LEFT LOWER LEG;  Surgeon: Angelia Mould, MD;  Location: [place];  Service: Vascular;  Laterality: Left;  . Abdominal angiogram N/A 01/02/2014    Procedure: ABDOMINAL ANGIOGRAM;  Surgeon: Conrad Healy, MD;  Location: [facility] CATH LAB;  Service: Cardiovascular;  Laterality: N/A;  . Peripheral vascular catheterization  01/02/2014    Procedure: LOWER EXTREMITY ANGIOGRAPHY;  Surgeon: Conrad Mills, MD;  Location: [facility] CATH LAB;  Service: Cardiovascular;;  . Arch aortogram  01/02/2014    Procedure: ARCH AORTOGRAM;  Surgeon: Conrad Flaxville, MD;  Location: [facility] CATH LAB;  Service: Cardiovascular;;    Family History  Problem Relation Age of Onset  . Dementia Mother   . Heart attack Father   . Coronary artery disease Father   . Peripheral vascular disease Sister   . Diabetes Brother   . Peripheral vascular disease Brother   . Hypertension Brother   . Coronary artery disease Brother     Social History:  reports that he quit smoking about 9 years ago. His smoking use included Cigarettes. He has a 50 pack-year smoking  history. He quit smokeless tobacco use about 18 years ago. He reports that he does not drink alcohol or use illicit drugs.  Allergies:  Allergies  Allergen Reactions  . Morphine And Related     Pt felt flushed and was diaphoretic     Medications:  I have reviewed the patient's current medications. Prior to Admission:  Prescriptions prior to admission  Medication Sig Dispense Refill Last Dose  . aspirin 81 MG tablet Take 1 tablet (81 mg total) by mouth daily. 90 tablet 3 02/11/2014 at Unknown time  . ferrous sulfate 325 (65 FE) MG tablet Take 325 mg by mouth 2 (two) times daily.  0 02/11/2014 at Unknown time  . gabapentin (NEURONTIN) 300 MG capsule Take 1 capsule (300 mg total) by mouth 3 (three) times daily. 270 capsule 3 02/11/2014 at Unknown time  . Insulin Detemir (LEVEMIR FLEXTOUCH) 100 UNIT/ML Pen Inject 45 Units into the skin daily at 10 pm. (Patient taking differently: Inject 50 Units into the skin daily at 10 pm. ) 15 mL 11 02/10/2014 at Unknown time  . insulin lispro (HUMALOG) 100 UNIT/ML KiwkPen Inject 12 Units into the skin 3 (three) times daily with meals.   02/11/2014 at Unknown time  . lisinopril (PRINIVIL,ZESTRIL) 5 MG tablet Take 5 mg by mouth daily.   02/11/2014 at Unknown time  . metoprolol (LOPRESSOR) 50 MG tablet Take 25 mg by mouth 2 (two) times daily.   02/11/2014 at 0930  . simvastatin (ZOCOR) 10 MG tablet Take 1 tablet (10 mg total) by mouth at bedtime. 90 tablet 3 02/10/2014 at Unknown time  . sulfamethoxazole-trimethoprim (BACTRIM DS,SEPTRA DS) 800-160 MG per tablet Take 1 tablet by mouth 2 (two) times daily. 14 tablet 0 02/11/2014 at Unknown time  . traMADol (ULTRAM) 50 MG tablet Take by mouth every 6 (six) hours as needed.   02/10/2014 at Unknown time  . triamcinolone cream (KENALOG) 0.1 % Apply 1 application topically 2 (two) times daily. Apply to leg 60 g 1 Past Week at Unknown time  . acetaminophen (TYLENOL) 500 MG tablet Take 2 tablets (1,000 mg total) by  mouth every 6 (six) hours as needed. Pain. 90 tablet 3 unknown  . albuterol (PROVENTIL HFA;VENTOLIN HFA) 108 (90 BASE) MCG/ACT inhaler Inhale 2 puffs into the lungs every 4 (four) hours as needed. 18 g 3 unknown  . BAYER CONTOUR NEXT TEST test strip USE TO TEST BLOOD SUGAR LEVELS THREE TIMES DAILY 100 each 11 Taking  . cloNIDine (CATAPRES - DOSED IN MG/24 HR) 0.1 mg/24hr patch Place 0.1 mg onto the skin once a week.   02/05/2014  . metoprolol tartrate (LOPRESSOR) 25 MG tablet Take 1 tablet (25 mg total) by mouth 2 (two) times daily. (Patient not taking: Reported on 02/11/2014) 180 tablet 3 Taking   Scheduled: . aspirin  81 mg Oral Daily  . atorvastatin  80  mg Oral q1800  . ceFEPime (MAXIPIME) IV  1 g Intravenous Q8H  . cloNIDine  0.1 mg Transdermal Weekly  . ferrous sulfate  325 mg Oral BID  . gabapentin  300 mg Oral TID  . insulin aspart  0-20 Units Subcutaneous TID WC  . insulin aspart  0-5 Units Subcutaneous QHS  . insulin detemir  50 Units Subcutaneous Q2200  . lisinopril  5 mg Oral Daily  . metoprolol  25 mg Oral BID  . nitroGLYCERIN  1 inch Topical 4 times per day  . sodium chloride  3 mL Intravenous Q12H  . triamcinolone cream  1 application Topical BID  . vancomycin  750 mg Intravenous Q12H    Results for orders placed or performed during the hospital encounter of 02/11/14 (from the past 48 hour(s))  CBC with Differential     Status: Abnormal   Collection Time: 02/11/14  2:29 PM  Result Value Ref Range   WBC 17.0 (H) 4.0 - 10.5 K/uL   RBC 4.01 (L) 4.22 - 5.81 MIL/uL   Hemoglobin 10.9 (L) 13.0 - 17.0 g/dL   HCT 35.0 (L) 39.0 - 52.0 %   MCV 87.3 78.0 - 100.0 fL   MCH 27.2 26.0 - 34.0 pg   MCHC 31.1 30.0 - 36.0 g/dL   RDW 13.2 11.5 - 15.5 %   Platelets 293 150 - 400 K/uL   Neutrophils Relative % 90 (H) 43 - 77 %   Neutro Abs 15.3 (H) 1.7 - 7.7 K/uL   Lymphocytes Relative 3 (L) 12 - 46 %   Lymphs Abs 0.5 (L) 0.7 - 4.0 K/uL   Monocytes Relative 7 3 - 12 %   Monocytes  Absolute 1.2 (H) 0.1 - 1.0 K/uL   Eosinophils Relative 0 0 - 5 %   Eosinophils Absolute 0.0 0.0 - 0.7 K/uL   Basophils Relative 0 0 - 1 %   Basophils Absolute 0.0 0.0 - 0.1 K/uL  Comprehensive metabolic panel     Status: Abnormal   Collection Time: 02/11/14  2:29 PM  Result Value Ref Range   Sodium 137 135 - 145 mmol/L    Comment: Please note change in reference range.   Potassium 4.1 3.5 - 5.1 mmol/L    Comment: Please note change in reference range.   Chloride 102 96 - 112 mEq/L   CO2 28 19 - 32 mmol/L   Glucose, Bld 235 (H) 70 - 99 mg/dL   BUN 10 6 - 23 mg/dL   Creatinine, Ser 1.36 (H) 0.50 - 1.35 mg/dL   Calcium 9.6 8.4 - 10.5 mg/dL   Total Protein 7.7 6.0 - 8.3 g/dL   Albumin 3.6 3.5 - 5.2 g/dL   AST 15 0 - 37 U/L   ALT 15 0 - 53 U/L   Alkaline Phosphatase 61 39 - 117 U/L   Total Bilirubin 1.1 0.3 - 1.2 mg/dL   GFR calc non Af Amer 53 (L) >90 mL/min   GFR calc Af Amer 62 (L) >90 mL/min    Comment: (NOTE) The eGFR has been calculated using the CKD EPI equation. This calculation has not been validated in all clinical situations. eGFR's persistently <90 mL/min signify possible Chronic Kidney Disease.    Anion gap 7 5 - 15  Brain natriuretic peptide     Status: Abnormal   Collection Time: 02/11/14  2:29 PM  Result Value Ref Range   B Natriuretic Peptide 1142.0 (H) 0.0 - 100.0 pg/mL    Comment: Please  note change in reference range.  Urinalysis, Routine w reflex microscopic     Status: Abnormal   Collection Time: 02/11/14  2:56 PM  Result Value Ref Range   Color, Urine AMBER (A) YELLOW    Comment: BIOCHEMICALS MAY BE AFFECTED BY COLOR   APPearance CLEAR CLEAR   Specific Gravity, Urine 1.025 1.005 - 1.030   pH 6.0 5.0 - 8.0   Glucose, UA 250 (A) NEGATIVE mg/dL   Hgb urine dipstick LARGE (A) NEGATIVE   Bilirubin Urine NEGATIVE NEGATIVE   Ketones, ur NEGATIVE NEGATIVE mg/dL   Protein, ur >300 (A) NEGATIVE mg/dL   Urobilinogen, UA 1.0 0.0 - 1.0 mg/dL   Nitrite NEGATIVE  NEGATIVE   Leukocytes, UA NEGATIVE NEGATIVE  Urine microscopic-add on     Status: None   Collection Time: 02/11/14  2:56 PM  Result Value Ref Range   WBC, UA 0-2 <3 WBC/hpf   RBC / HPF 11-20 <3 RBC/hpf  I-Stat CG4 Lactic Acid, ED     Status: None   Collection Time: 02/11/14  3:00 PM  Result Value Ref Range   Lactic Acid, Venous 1.75 0.5 - 2.2 mmol/L  Influenza panel by pcr     Status: None   Collection Time: 02/11/14  3:53 PM  Result Value Ref Range   Influenza A By PCR NEGATIVE NEGATIVE   Influenza B By PCR NEGATIVE NEGATIVE   H1N1 flu by pcr NOT DETECTED NOT DETECTED    Comment:        The Xpert Flu assay (FDA approved for nasal aspirates or washes and nasopharyngeal swab specimens), is intended as an aid in the diagnosis of influenza and should not be used as a sole basis for treatment.   Blood gas, arterial ([facility] & [facility] ONLY)     Status: Abnormal   Collection Time: 02/11/14  6:24 PM  Result Value Ref Range   FIO2 0.45 %   Delivery systems BILEVEL POSITIVE AIRWAY PRESSURE    Rate 8 resp/min   Inspiratory PAP 10    Expiratory PAP 5    pH, Arterial 7.383 7.350 - 7.450   pCO2 arterial 40.1 35.0 - 45.0 mmHg   pO2, Arterial 74.2 (L) 80.0 - 100.0 mmHg   Bicarbonate 23.3 20.0 - 24.0 mEq/L   TCO2 21.7 0 - 100 mmol/L   Acid-base deficit 1.1 0.0 - 2.0 mmol/L   O2 Saturation 93.7 %   Patient temperature 37.0    Collection site RIGHT RADIAL    Drawn by [id number]    Sample type ARTERIAL DRAW    Allens test (pass/fail) PASS PASS  MRSA PCR Screening     Status: None   Collection Time: 02/11/14  6:40 PM  Result Value Ref Range   MRSA by PCR NEGATIVE NEGATIVE    Comment:        The GeneXpert MRSA Assay (FDA approved for NASAL specimens only), is one component of a comprehensive MRSA colonization surveillance program. It is not intended to diagnose MRSA infection nor to guide or monitor treatment for MRSA infections.   TSH     Status: None   Collection Time: 02/11/14  7:40 PM    Result Value Ref Range   TSH 1.341 0.350 - 4.500 uIU/mL  Glucose, capillary     Status: Abnormal   Collection Time: 02/12/14 12:00 AM  Result Value Ref Range   Glucose-Capillary 233 (H) 70 - 99 mg/dL  Glucose, capillary     Status: Abnormal   Collection Time: 02/12/14  1:03 AM  Result Value Ref Range   Glucose-Capillary 186 (H) 70 - 99 mg/dL  Troponin I     Status: Abnormal   Collection Time: 02/12/14  1:55 AM  Result Value Ref Range   Troponin I 8.01 (HH) <0.031 ng/mL    Comment:        POSSIBLE MYOCARDIAL ISCHEMIA. SERIAL TESTING RECOMMENDED. Please note change in reference range. RESULT REPEATED AND VERIFIED CRITICAL RESULT CALLED TO, READ BACK BY AND VERIFIED WITH: NEILSON T AT 0301 ON [id number] BY FORSYTH K   Brain natriuretic peptide     Status: Abnormal   Collection Time: 02/12/14  1:55 AM  Result Value Ref Range   B Natriuretic Peptide 2605.0 (H) 0.0 - 100.0 pg/mL    Comment: Please note change in reference range.  Comprehensive metabolic panel     Status: Abnormal   Collection Time: 02/12/14  1:55 AM  Result Value Ref Range   Sodium 132 (L) 135 - 145 mmol/L    Comment: Please note change in reference range.   Potassium 3.8 3.5 - 5.1 mmol/L    Comment: Please note change in reference range.   Chloride 102 96 - 112 mEq/L   CO2 23 19 - 32 mmol/L   Glucose, Bld 188 (H) 70 - 99 mg/dL   BUN 16 6 - 23 mg/dL   Creatinine, Ser 1.67 (H) 0.50 - 1.35 mg/dL   Calcium 8.4 8.4 - 10.5 mg/dL   Total Protein 6.2 6.0 - 8.3 g/dL   Albumin 2.9 (L) 3.5 - 5.2 g/dL   AST 34 0 - 37 U/L   ALT 16 0 - 53 U/L   Alkaline Phosphatase 50 39 - 117 U/L   Total Bilirubin 1.2 0.3 - 1.2 mg/dL   GFR calc non Af Amer 42 (L) >90 mL/min   GFR calc Af Amer 48 (L) >90 mL/min    Comment: (NOTE) The eGFR has been calculated using the CKD EPI equation. This calculation has not been validated in all clinical situations. eGFR's persistently <90 mL/min signify possible Chronic Kidney Disease.    Anion  gap 7 5 - 15  CBC     Status: Abnormal   Collection Time: 02/12/14  1:55 AM  Result Value Ref Range   WBC 17.5 (H) 4.0 - 10.5 K/uL   RBC 3.59 (L) 4.22 - 5.81 MIL/uL   Hemoglobin 9.6 (L) 13.0 - 17.0 g/dL   HCT 31.0 (L) 39.0 - 52.0 %   MCV 86.4 78.0 - 100.0 fL   MCH 26.7 26.0 - 34.0 pg   MCHC 31.0 30.0 - 36.0 g/dL   RDW 13.3 11.5 - 15.5 %   Platelets 248 150 - 400 K/uL  Protime-INR     Status: None   Collection Time: 02/12/14  3:23 AM  Result Value Ref Range   Prothrombin Time 15.0 11.6 - 15.2 seconds   INR 1.17 0.00 - 1.49    Dg Chest 2 View  02/11/2014   CLINICAL DATA:  Fever.  Hypoxia.  EXAM: CHEST  2 VIEW  COMPARISON:  07/15/2005 and 07/10/2005  FINDINGS: Pacemaker in place. Previous median sternotomy. There is slight pulmonary vascular congestion but overall heart size is normal. The hilar structures are prominent, right more than left but I feel this is due to the vascular congestion. No effusions. No acute osseous abnormality.  IMPRESSION: Slight pulmonary vascular congestion.   Electronically Signed   By: Rozetta Nunnery M.D.   On: 02/11/2014 15:53   Ct Angio  Chest Pe W/cm &/or Wo Cm  02/11/2014   CLINICAL DATA:  Short of breath and hypoxia.  Chills and fever  EXAM: CT ANGIOGRAPHY CHEST WITH CONTRAST  TECHNIQUE: Multidetector CT imaging of the chest was performed using the standard protocol during bolus administration of intravenous contrast. Multiplanar CT image reconstructions and MIPs were obtained to evaluate the vascular anatomy.  CONTRAST:  126mL OMNIPAQUE IOHEXOL 350 MG/ML SOLN  COMPARISON:  None.  FINDINGS: Mediastinum: There is moderate cardiac enlargement. The patient is status post median sternotomy and CABG procedure. No pericardial effusion identified. The trachea appears patent and is midline. Normal appearance of the esophagus. Prominent AP window lymph node measures 1.4 cm, image number 35/series 6. The main pulmonary artery appears patent. No lobar or segmental pulmonary  artery filling defects to suggest a clinically significant pulmonary embolus.  Lungs/Pleura: No airspace consolidation. Mild lower lobe predominant dependent changes are identified including subsegmental atelectasis.  Upper Abdomen: The visualized portions of the liver and spleen are normal. The adrenal glands are unremarkable. The visualized portions of the gallbladder are also normal.  Musculoskeletal: Mild multi level degenerative disc disease identified within the thoracic spine. There is a curvature of the thoracic spine which is convex towards the right.  Review of the MIP images confirms the above findings.  IMPRESSION: 1. No evidence for acute pulmonary embolus. 2. Cardiac enlargement status post CABG procedure.   Electronically Signed   By: Kerby Moors M.D.   On: 02/11/2014 17:21    Review of Systems  Constitutional: Positive for fever, chills and malaise/fatigue.  HENT: Negative for hearing loss.   Eyes: Negative for photophobia and pain.  Respiratory: Positive for shortness of breath. Negative for hemoptysis.   Cardiovascular: Positive for chest pain.  Gastrointestinal: Negative for nausea, vomiting and abdominal pain.  Genitourinary: Negative for dysuria and hematuria.  Musculoskeletal: Negative for myalgias and neck pain.  Skin: Negative for rash.  Neurological: Negative for dizziness, tingling and tremors.  Endo/Heme/Allergies: Negative for polydipsia.  Psychiatric/Behavioral: Negative for depression, suicidal ideas and substance abuse.   Blood pressure 111/56, pulse 70, temperature 100 F (37.8 C), temperature source Oral, resp. rate 25, height $RemoveBe'5\' 5"'oBRHEOUWP$  (1.651 m), weight 88.2 kg (194 lb 7.1 oz), SpO2 98 %. Physical Exam  Nursing note reviewed. Constitutional: He is oriented to person, place, and time. He appears well-developed and well-nourished. No distress.  HENT:  Head: Normocephalic and atraumatic.  Nose: Nose normal.  Mouth/Throat: Oropharynx is clear and moist. No  oropharyngeal exudate.  Eyes: Conjunctivae and EOM are normal. Pupils are equal, round, and reactive to light. No scleral icterus.  Neck: Normal range of motion. Neck supple. JVD present. No tracheal deviation present.  jvp 2-3 cm  Cardiovascular: Normal rate, regular rhythm, normal heart sounds and intact distal pulses.  Exam reveals no gallop.   No murmur heard. Respiratory: He is in respiratory distress. He has rales.  GI: Soft. Bowel sounds are normal. He exhibits no distension. There is no tenderness. There is no rebound.  Musculoskeletal: Normal range of motion. He exhibits edema.  Trace lower extremity edema  Neurological: He is alert and oriented to person, place, and time. No cranial nerve deficit.  Skin: Skin is warm and dry. No rash noted. He is not diaphoretic. No erythema.  Psychiatric: He has a normal mood and affect. His behavior is normal. Thought content normal.   Troponin 8 Cr 1.6 EKG: a-paced rhythm CTA: negative for PE  Assessment/Plan: NSTEMI ? HCAP CKD stage III T2DM  on insulin Hypertension Dyslipidemia Peripheral Vascular disease  [name] is a 63 yo man with PMH of COPD, CAD s/p CABG '03, right sided pace maker, T2DM on insulin, dyslipidemia and PVD who presented to [facility] with a fever of 103 measured by his home health nurse and found to have elevated troponin leading to transfer to [facility]. He has type I vs. Type II NSTEMI. Troponin of 8 fairly elevated. He does have some respiratory symptoms and is being treated for PNA. At this time, favor sorting out medical issues but he warrants ischemic evaluation with cardiac cath once stable.  - keep NPO for this AM - continue aspirin, heparin gtt - atoravstatin 80 mg qHS - on metoprolol, lisinopril per home medications - plan to evaluate medical issues before pursuing LHC most likely  - update echocardiogram  Dillon Livermore 02/12/2014, 6:25 AM

## 2014-02-12 NOTE — Progress Notes (Signed)
CRITICAL VALUE ALERT  Critical value received:  Troponin 8.01  Date of notification:  02/12/14  Time of notification:  0310  Critical value read back: yes  Nurse who received alert:  T. Gaynelle AduNeilson RN / R. Ardelle AntonWagoner RN  MD notified (1st page):  S. Newton  Time of first page:  0311  MD notified (2nd page):  Time of second page:  Responding MD:  Brenham BlasS. Newton  Time MD responded:  [phone]  Orders entered into EPIC

## 2014-02-12 NOTE — Progress Notes (Signed)
Pt is refusing blood draws from lab. Importance of blood tests and diagnostics explained to patient and he still Is adamantly refusing the draws. Pt is alert and oriented and appropriate in answering questions. Staff will  Attempt at later time to again explain and discuss lab tests and lab draws to patient to see if he is willing to  Allow blood draws later.

## 2014-02-12 NOTE — Progress Notes (Signed)
  Echocardiogram 2D Echocardiogram has been performed.  [name], [name] 02/12/2014, 11:10 AM

## 2014-02-12 NOTE — Progress Notes (Signed)
Peripherally Inserted Central Catheter/Midline Placement  The IV Nurse has discussed with the patient and/or persons authorized to consent for the patient, the purpose of this procedure and the potential benefits and risks involved with this procedure.  The benefits include less needle sticks, lab draws from the catheter and patient may be discharged home with the catheter.  Risks include, but not limited to, infection, bleeding, blood clot (thrombus formation), and puncture of an artery; nerve damage and irregular heat beat.  Alternatives to this procedure were also discussed.  PICC/Midline Placement Documentation        [name], [name] 02/12/2014, 2:22 PM Obtained consent from wife at bedside.  Pt requested she sign it.

## 2014-02-12 NOTE — Progress Notes (Signed)
ANTICOAGULATION CONSULT NOTE - Initial Consult  Pharmacy Consult for Heparin Indication: chest pain/ACS  Allergies  Allergen Reactions  . Morphine And Related     Pt felt flushed and was diaphoretic     Patient Measurements: Height: 5\' 4"  (162.6 cm) Weight: 198 lb 6.6 oz (90 kg) IBW/kg (Calculated) : 59.2 Heparin Dosing Weight: 78.8 kg  Vital Signs: Temp: 100.1 F (37.8 C) (12/30 0000) Temp Source: Oral (12/30 0000) BP: 105/56 mmHg (12/30 0200) Pulse Rate: 70 (12/30 0200)  Labs:  Recent Labs  02/10/14 1206 02/11/14 1429 02/12/14 0155  HGB 10.7* 10.9* 9.6*  HCT 34.0* 35.0* 31.0*  PLT 297 293 248  CREATININE 1.12 1.36* 1.67*  TROPONINI  --   --  8.01*    Estimated Creatinine Clearance: 45.2 mL/min (by C-G formula based on Cr of 1.67).   Medical History: Past Medical History  Diagnosis Date  . Arteriosclerotic cardiovascular disease (ASCVD)     CABG in 2003 for 3 VD; 07/2007: Stress nuclear-inferior infarction without ischemia; normal EF by echo in 06/2005  . Diabetes mellitus type II   . Hyperlipidemia   . Degenerative joint disease     Left knee  . Cerebrovascular disease 2003    Carotid ultrasound in 2010: Patent right CEA site, 60-79% left internal carotid artery  . Peripheral vascular disease 2007    aortobifemoral BPG-2007  . Anemia   . Peripheral neuropathy   . Hypertension   . Sick sinus syndrome 1996    permanent pacemaker-1996; generator change in 2010  . Chronic obstructive pulmonary disease   . Obesity   . Obstructive sleep apnea   . Benign prostatic hypertrophy   . Gastroesophageal reflux disease   . Tobacco abuse, in remission     50 pack years total consumption; Quit in 2007    Medications:  Prescriptions prior to admission  Medication Sig Dispense Refill Last Dose  . aspirin 81 MG tablet Take 1 tablet (81 mg total) by mouth daily. 90 tablet 3 02/11/2014 at Unknown time  . ferrous sulfate 325 (65 FE) MG tablet Take 325 mg by mouth 2  (two) times daily.  0 02/11/2014 at Unknown time  . gabapentin (NEURONTIN) 300 MG capsule Take 1 capsule (300 mg total) by mouth 3 (three) times daily. 270 capsule 3 02/11/2014 at Unknown time  . Insulin Detemir (LEVEMIR FLEXTOUCH) 100 UNIT/ML Pen Inject 45 Units into the skin daily at 10 pm. (Patient taking differently: Inject 50 Units into the skin daily at 10 pm. ) 15 mL 11 02/10/2014 at Unknown time  . insulin lispro (HUMALOG) 100 UNIT/ML KiwkPen Inject 12 Units into the skin 3 (three) times daily with meals.   02/11/2014 at Unknown time  . lisinopril (PRINIVIL,ZESTRIL) 5 MG tablet Take 5 mg by mouth daily.   02/11/2014 at Unknown time  . metoprolol (LOPRESSOR) 50 MG tablet Take 25 mg by mouth 2 (two) times daily.   02/11/2014 at 0930  . simvastatin (ZOCOR) 10 MG tablet Take 1 tablet (10 mg total) by mouth at bedtime. 90 tablet 3 02/10/2014 at Unknown time  . sulfamethoxazole-trimethoprim (BACTRIM DS,SEPTRA DS) 800-160 MG per tablet Take 1 tablet by mouth 2 (two) times daily. 14 tablet 0 02/11/2014 at Unknown time  . traMADol (ULTRAM) 50 MG tablet Take by mouth every 6 (six) hours as needed.   02/10/2014 at Unknown time  . triamcinolone cream (KENALOG) 0.1 % Apply 1 application topically 2 (two) times daily. Apply to leg 60 g 1 Past Week  at Unknown time  . acetaminophen (TYLENOL) 500 MG tablet Take 2 tablets (1,000 mg total) by mouth every 6 (six) hours as needed. Pain. 90 tablet 3 unknown  . albuterol (PROVENTIL HFA;VENTOLIN HFA) 108 (90 BASE) MCG/ACT inhaler Inhale 2 puffs into the lungs every 4 (four) hours as needed. 18 g 3 unknown  . BAYER CONTOUR NEXT TEST test strip USE TO TEST BLOOD SUGAR LEVELS THREE TIMES DAILY 100 each 11 Taking  . cloNIDine (CATAPRES - DOSED IN MG/24 HR) 0.1 mg/24hr patch Place 0.1 mg onto the skin once a week.   02/05/2014  . metoprolol tartrate (LOPRESSOR) 25 MG tablet Take 1 tablet (25 mg total) by mouth 2 (two) times daily. (Patient not taking: Reported on  02/11/2014) 180 tablet 3 Taking    Assessment: Okay for Protocol, elevated Troponin I, patient received Lovenox 40mg  @ 22:56 on 12/29.  Hx CABG.  Baseline PT/INR pending.  No bleeding complications per RN.  Goal of Therapy:  Heparin level 0.3-0.7 units/ml Monitor platelets by anticoagulation protocol: Yes   Plan:  Give 2000 units bolus x 1 Start heparin infusion at 1100 units/hr Check anti-Xa level in 6-8 hours and daily while on heparin Continue to monitor H&H and platelets  [name], [name] 02/12/2014,3:29 AM

## 2014-02-12 NOTE — Progress Notes (Signed)
Pharmacy Consultation for Heparin  Indication: elevated troponins  Pt lost his IV today so heparin was off for a while. Resumed at 1100 units/hr at 1500. 6 hr heparin level was subtherapuetic at 0;12. Heparin was increased to 1300 units/hr and will check a heparin level and CBC with AM labs.

## 2014-02-12 NOTE — Evaluation (Signed)
Noted trop 8 in setting of acute resp failure w/ hypoxia Baseline CAD s/p CABG Discussed case w/ cards fellow Tresa Endo(Kelly) [facility]  Full dose ASA Heparin gtt Nitro paste---follow pressures

## 2014-02-12 NOTE — Progress Notes (Signed)
[facility] PROGRESS NOTE  Filed Weights   02/11/14 1328 02/11/14 2059 02/12/14 0535  Weight: 89.812 kg (198 lb) 90 kg (198 lb 6.6 oz) 88.2 kg (194 lb 7.1 oz)        Intake/Output Summary (Last 24 hours) at 02/12/14 0734 Last data filed at 02/12/14 0600  Gross per 24 hour  Intake  632.8 ml  Output      0 ml  Net  632.8 ml     Assessment/Plan: Acute respiratory failure with hypoxia - In the setting of probable pneumonia with leukocytosis, fever and a chest x-ray showing infiltrates and probable ongoing non-ST elevation MI. - Currently satting 100% on 2 L.  Sirs/Sepsis / HCAP: - Symptoms of cough, fever and hypoxia. He did have a CT of the chest that shows no new infiltrates - Clinical wise compatible with pneumonia. - Discharged from the hospital within 90 days I agree with vancomycin and cefepime. Blood cultures have been ordered check an HIV and influenza PCR start empiric treatment with Tamiflu. - US of groin b/l rule out abscess. - Tylenol for fever.  AKI (acute kidney injury)/acute toxic nephropathy: - Multifactorial due to sepsis, ACE inhibitor and ontrast-induced nephropathy. - I will go ahead and stop ace inhibitors, will start him on gentle IV hydration. - Recheck a basic metabolic panel in the morning.  NSTEMI (non-ST elevated myocardial infarction) - First set of cardiac enzymes positive we'll continue cycle. Currently on heparin and aspirin, statin and beta blockers. - Cardiology on board and recommended an ischemic evaluation.  Hyponatremia - Check urinary sodium and urinary creatinine. - I don't appreciate any JVD, no lower extremity edema. He does have crackles on the right lung base. - Start him on gentle hydration.  Chronic obstructive pulmonary disease - Stable.  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  Obesity -  Counseling.  Essential hypertension - We'll start him on Imdur. - Blood pressure mildly high.    Code Status: Full code   DVT Prophylaxis: Heparin  Family Communication: I discussed the plan with the patient at the bedside.   Disposition Plan: Home when medically stable.    Consultants:  Cardiology  Procedures: ECHO: Pending  Antibiotics:  Vanco and cefepime 02/11/2014  HPI/Subjective: Cold, still feels short of breath.  Objective: Filed Vitals:   02/12/14 0545 02/12/14 0600 02/12/14 0615 02/12/14 0630  BP: 114/51 111/56 116/47 136/61  Pulse: 70 70 69 69  Temp:      TempSrc:      Resp: 26 25 24 17   Height:      Weight:      SpO2: 97% 98% 98% 100%     Exam:  General: Alert, awake, oriented x3, in no acute distress.  HEENT: No bruits, no goiter. Negative JVD Heart: Regular rate and rhythm. Lungs: Good air movement, crackles on the right Abdomen: Soft, nontender, nondistended, positive bowel sounds.  Skin: Bilateral indurated of groins but no fluctuating masses on the groin with some superficial dilatation of the skin without any purulent drainage no warm to touch or erythematous.   Data Reviewed: Basic Metabolic Panel:  Recent Labs Lab 02/10/14 1206 02/11/14 1429 02/12/14 0155  NA 139 137 132*  K 4.3 4.1 3.8  CL 102 102 102  CO2 28 28 23   GLUCOSE 234* 235* 188*  BUN 10 10 16   CREATININE 1.12 1.36* 1.67*  CALCIUM 9.6 9.6 8.4  Liver Function Tests:  Recent Labs Lab 02/10/14 1206 02/11/14 1429 02/12/14 0155  AST 14 15 34  ALT 12 15 16   ALKPHOS 63 61 50  BILITOT 0.7 1.1 1.2  PROT 6.9 7.7 6.2  ALBUMIN 3.6 3.6 2.9*   No results for input(s): LIPASE, AMYLASE in the last 168 hours. No results for input(s): AMMONIA in the last 168 hours. CBC:  Recent Labs Lab 02/10/14 1206 02/11/14 1429 02/12/14 0155  WBC 5.4 17.0* 17.5*  NEUTROABS 3.0 15.3*  --   HGB 10.7* 10.9* 9.6*  HCT 34.0* 35.0* 31.0*  MCV 84.6 87.3 86.4  PLT 297 293 248    Cardiac Enzymes:  Recent Labs Lab 02/12/14 0155  TROPONINI 8.01*   BNP (last 3 results) No results for input(s): PROBNP in the last 8760 hours. CBG:  Recent Labs Lab 02/12/14 02/12/14 0103  GLUCAP 233* 186*    Recent Results (from the past 240 hour(s))  MRSA PCR Screening     Status: None   Collection Time: 02/11/14  6:40 PM  Result Value Ref Range Status   MRSA by PCR NEGATIVE NEGATIVE Final    Comment:        The GeneXpert MRSA Assay (FDA approved for NASAL specimens only), is one component of a comprehensive MRSA colonization surveillance program. It is not intended to diagnose MRSA infection nor to guide or monitor treatment for MRSA infections.      Studies: Dg Chest 2 View  02/11/2014   CLINICAL DATA:  Fever.  Hypoxia.  EXAM: CHEST  2 VIEW  COMPARISON:  07/15/2005 and 07/10/2005  FINDINGS: Pacemaker in place. Previous median sternotomy. There is slight pulmonary vascular congestion but overall heart size is normal. The hilar structures are prominent, right more than left but I feel this is due to the vascular congestion. No effusions. No acute osseous abnormality.  IMPRESSION: Slight pulmonary vascular congestion.   Electronically Signed   By: Geanie [name] M.D.   On: 02/11/2014 15:53   Ct Angio Chest Pe W/cm &/or Wo Cm  02/11/2014   CLINICAL DATA:  Short of breath and hypoxia.  Chills and fever  EXAM: CT ANGIOGRAPHY CHEST WITH CONTRAST  TECHNIQUE: Multidetector CT imaging of the chest was performed using the standard protocol during bolus administration of intravenous contrast. Multiplanar CT image reconstructions and MIPs were obtained to evaluate the vascular anatomy.  CONTRAST:  OMNIPAQUE IOHEXOL 350 MG/ML SOLN  COMPARISON:  None.  FINDINGS: Mediastinum: There is moderate cardiac enlargement. The patient is status post median sternotomy and CABG procedure. No pericardial effusion identified. The trachea appears patent and is midline. Normal appearance of  the esophagus. Prominent AP window lymph node measures 1.4 cm, image number 35/series 6. The main pulmonary artery appears patent. No lobar or segmental pulmonary artery filling defects to suggest a clinically significant pulmonary embolus.  Lungs/Pleura: No airspace consolidation. Mild lower lobe predominant dependent changes are identified including subsegmental atelectasis.  Upper Abdomen: The visualized portions of the liver and spleen are normal. The adrenal glands are unremarkable. The visualized portions of the gallbladder are also normal.  Musculoskeletal: Mild multi level degenerative disc disease identified within the thoracic spine. There is a curvature of the thoracic spine which is convex towards the right.  Review of the MIP images confirms the above findings.  IMPRESSION: 1. No evidence for acute pulmonary embolus. 2. Cardiac enlargement status post CABG procedure.   Electronically Signed   By: Signa Kell M.D.   On: 02/11/2014 17:21  Scheduled Meds: . aspirin  81 mg Oral Daily  . atorvastatin  80 mg Oral q1800  . ceFEPime (MAXIPIME) IV  1 g Intravenous Q8H  . cloNIDine  0.1 mg Transdermal Weekly  . ferrous sulfate  325 mg Oral BID  . gabapentin  300 mg Oral TID  . insulin aspart  0-20 Units Subcutaneous TID WC  . insulin aspart  0-5 Units Subcutaneous QHS  . insulin detemir  50 Units Subcutaneous Q2200  . lisinopril  5 mg Oral Daily  . metoprolol  25 mg Oral BID  . nitroGLYCERIN  1 inch Topical 4 times per day  . sodium chloride  3 mL Intravenous Q12H  . triamcinolone cream  1 application Topical BID  . vancomycin  750 mg Intravenous Q12H   Continuous Infusions: . sodium chloride 75 mL/hr at 02/12/14 0500  . heparin 1,100 Units/hr (02/12/14 0345)     Marinda ElkFELIZ ORTIZ, Yilia Sacca  [facility] Pager [phone] If 8PM-8AM, please contact night-coverage at [email or web address], password [email or web address] 02/12/2014, 7:34 AM  LOS: 1 day

## 2014-02-12 NOTE — Progress Notes (Signed)
Pt was seen this am by Dr. Tresa EndoKelly as a consult.   Briefly, pt admitted with likely pneumonia, fever and elevated troponin. Agree with plans to cycle cardiac markers today and update echo to assess LV function. Continue IV heparin. Will need a cardiac cath before discharge when clinically improved and afebrile.   We will follow with you.   MCALHANY,CHRISTOPHER 02/12/2014 8:09 AM

## 2014-02-12 NOTE — Care Management Note (Signed)
    Page 2 of 2   03/04/2014     3:25:15 PM CARE MANAGEMENT NOTE 03/04/2014  Patient:  [name],[name]   Account Number:  [id number]  Date Initiated:  02/12/2014  Documentation initiated by:  Junius CreamerWELL,DEBBIE  Subjective/Objective Assessment:   adm w hypoxia, sepsis     Action/Plan:   lives w wife, pcp dr Riesa [place]   Anticipated DC Date:  03/04/2014   Anticipated DC Plan:  SKILLED NURSING FACILITY  In-house referral  Clinical Social Worker      DC Planning Services  CM consult      [facility]  Resumption Of Svcs/PTA Provider   Choice offered to / List presented to:  [name]        HH arranged  HH-1 RN  HH-2 PT  HH-3 OT      [facility]   Status of service:  Completed, signed off Medicare Important Message given?  YES (If response is "NO", the following Medicare IM given date fields will be blank) Date Medicare IM given:  02/17/2014 Medicare IM given by:  Donn PieriniWEBSTER,Oden Lindaman Date Additional Medicare IM given:  03/03/2014 Additional Medicare IM given by:  Donn PieriniKRISTI Jisela Merlino  Discharge Disposition:  SKILLED NURSING FACILITY  Per UR Regulation:  Reviewed for med. necessity/level of care/duration of stay  If discussed at Long Length of Stay Meetings, dates discussed:   02/18/2014  03/04/2014    Comments:  03/04/14- 1400- Donn PieriniKristi Maicey Barrientez RN, BSN [phone] Plan for d/c today to SNF awaiting insurance auth.- CSW following.   02-27-14 9am Avie ArenasSarah Brown, RNBSN [phone] Consult recieved for [facility] referral - notified both [facility] and [facility] of referral. resp distress 1-13 while getting a picc line - increased confusion - placed on bipap. Note in epic about SNF post discharge????? Talked with wife and sister and patient in room. Clarifying what family wanting - Agreeable to take home and can get people to assist, but would like a higher level of care initially for him.   Discussed [facility] and SNF and differences.  If insurance approves - would like [facility] -  if not SNF [facility] called - out of network for patients insurance. [facility] is in network - needs prior approval checking on that now. CM will follow.  02/24/14 1732 Letha Capeeborah Taylor RN, BSN [phone]  NCM spoke with patient and wife, they state [facility], NCM informed CSW. [facility] PT, Ot, Pam is aware patient will need iv abx at dc.  02/17/14- 1700- Donn PieriniKristi Dallan Schonberg RN, BSN [phone] ?or later this week for flap closure  12/30  1342 debbie dowell rn,bsn spoke w pt's wife, pt was act [facility] for [facility]hrn and hhpt pta. alerted donna w [facility] that pt in hsopital.

## 2014-02-13 DIAGNOSIS — I25700 Atherosclerosis of coronary artery bypass graft(s), unspecified, with unstable angina pectoris: Secondary | ICD-10-CM

## 2014-02-13 DIAGNOSIS — R778 Other specified abnormalities of plasma proteins: Secondary | ICD-10-CM | POA: Insufficient documentation

## 2014-02-13 DIAGNOSIS — R7989 Other specified abnormal findings of blood chemistry: Secondary | ICD-10-CM

## 2014-02-13 LAB — BASIC METABOLIC PANEL
Anion gap: 5 (ref 5–15)
BUN: 24 mg/dL — AB (ref 6–23)
CO2: 25 mmol/L (ref 19–32)
Calcium: 8 mg/dL — ABNORMAL LOW (ref 8.4–10.5)
Chloride: 104 mEq/L (ref 96–112)
Creatinine, Ser: 1.89 mg/dL — ABNORMAL HIGH (ref 0.50–1.35)
GFR calc Af Amer: 42 mL/min — ABNORMAL LOW (ref >90)
GFR calc non Af Amer: 36 mL/min — ABNORMAL LOW (ref >90)
Glucose, Bld: 131 mg/dL — ABNORMAL HIGH (ref 70–99)
Potassium: 4.2 mmol/L (ref 3.5–5.1)
Sodium: 134 mmol/L — ABNORMAL LOW (ref 135–145)

## 2014-02-13 LAB — GLUCOSE, CAPILLARY
GLUCOSE-CAPILLARY: 138 mg/dL — AB (ref 70–99)
GLUCOSE-CAPILLARY: 139 mg/dL — AB (ref 70–99)
Glucose-Capillary: 125 mg/dL — ABNORMAL HIGH (ref 70–99)
Glucose-Capillary: 189 mg/dL — ABNORMAL HIGH (ref 70–99)

## 2014-02-13 LAB — HEPARIN LEVEL (UNFRACTIONATED)
HEPARIN UNFRACTIONATED: 0.13 [IU]/mL — AB (ref 0.30–0.70)
Heparin Unfractionated: 0.16 IU/mL — ABNORMAL LOW (ref 0.30–0.70)
Heparin Unfractionated: 0.41 IU/mL (ref 0.30–0.70)

## 2014-02-13 LAB — CBC
HCT: 28.6 % — ABNORMAL LOW (ref 39.0–52.0)
HEMOGLOBIN: 8.9 g/dL — AB (ref 13.0–17.0)
MCH: 26.6 pg (ref 26.0–34.0)
MCHC: 31.1 g/dL (ref 30.0–36.0)
MCV: 85.4 fL (ref 78.0–100.0)
PLATELETS: 207 10*3/uL (ref 150–400)
RBC: 3.35 MIL/uL — AB (ref 4.22–5.81)
RDW: 13.4 % (ref 11.5–15.5)
WBC: 15.4 10*3/uL — AB (ref 4.0–10.5)

## 2014-02-13 LAB — TROPONIN I: Troponin I: 1.32 ng/mL (ref <0.031)

## 2014-02-13 MED ORDER — HEPARIN BOLUS VIA INFUSION
2500.0000 [IU] | Freq: Once | INTRAVENOUS | Status: AC
  Administered 2014-02-13: 2500 [IU] via INTRAVENOUS
  Filled 2014-02-13: qty 2500

## 2014-02-13 MED ORDER — HEPARIN BOLUS VIA INFUSION
3000.0000 [IU] | Freq: Once | INTRAVENOUS | Status: AC
  Administered 2014-02-13: 3000 [IU] via INTRAVENOUS
  Filled 2014-02-13: qty 3000

## 2014-02-13 MED ORDER — DEXTROSE 5 % IV SOLN
1.0000 g | INTRAVENOUS | Status: DC
  Administered 2014-02-14 – 2014-02-16 (×3): 1 g via INTRAVENOUS
  Filled 2014-02-13 (×3): qty 1

## 2014-02-13 NOTE — Progress Notes (Signed)
SUBJECTIVE: NO chest pain.   Tele: atrial pacing  BP 162/73 mmHg  Pulse 77  Temp(Src) 99.6 F (37.6 C) (Oral)  Resp 28  Ht 5\' 5"  (1.651 m)  Wt 194 lb 7.1 oz (88.2 kg)  BMI 32.36 kg/m2  SpO2 97%  Intake/Output Summary (Last 24 hours) at 02/13/14 [id number] Last data filed at 02/13/14 0800  Gross per 24 hour  Intake 2902.66 ml  Output    150 ml  Net 2752.66 ml    PHYSICAL EXAM General: Well developed, well nourished, in no acute distress. Alert and oriented x 3.  Psych:  Good affect, responds appropriately Neck: No JVD. No masses noted.  Lungs: Clear bilaterally with no wheezes or rhonci noted.  Heart: RRR with no murmurs noted. Abdomen: Bowel sounds are present. Soft, non-tender.  Extremities: No lower extremity edema.   LABS: Basic Metabolic Panel:  Recent Labs  78/46/9612/29/15 1429 02/12/14 0155  NA 137 132*  K 4.1 3.8  CL 102 102  CO2 28 23  GLUCOSE 235* 188*  BUN 10 16  CREATININE 1.36* 1.67*  CALCIUM 9.6 8.4   CBC:  Recent Labs  02/10/14 1206 02/11/14 1429 02/12/14 0155 02/13/14 0720  WBC 5.4 17.0* 17.5* 15.4*  NEUTROABS 3.0 15.3*  --   --   HGB 10.7* 10.9* 9.6* 8.9*  HCT 34.0* 35.0* 31.0* 28.6*  MCV 84.6 87.3 86.4 85.4  PLT 297 293 248 207   Cardiac Enzymes:  Recent Labs  02/12/14 1045 02/12/14 1532 02/13/14 0005  TROPONINI 3.07* 2.31* 1.32*   Current Meds: . aspirin  81 mg Oral Daily  . atorvastatin  80 mg Oral q1800  . ceFEPime (MAXIPIME) IV  1 g Intravenous Q8H  . cloNIDine  0.1 mg Transdermal Weekly  . ferrous sulfate  325 mg Oral BID  . gabapentin  300 mg Oral TID  . insulin aspart  0-20 Units Subcutaneous TID WC  . insulin aspart  0-5 Units Subcutaneous QHS  . insulin detemir  50 Units Subcutaneous Q2200  . isosorbide mononitrate  15 mg Oral Daily  . metoprolol  25 mg Oral BID  . nitroGLYCERIN  1 inch Topical 4 times per day  . oseltamivir  30 mg Oral BID  . sodium chloride  10-40 mL Intracatheter Q12H  . sodium chloride  3  mL Intravenous Q12H  . triamcinolone cream  1 application Topical BID  . vancomycin  750 mg Intravenous Q12H   Echo 02/12/14: Left ventricle: The cavity size was normal. Wall thickness was increased in a pattern of moderate LVH. Systolic function was normal. The estimated ejection fraction was in the range of 60% to 65%. Wall motion was normal; there were no regional wall motion abnormalities. Doppler parameters are consistent with restrictive physiology, indicative of decreased left ventricular diastolic compliance and/or increased left atrial pressure. - Aortic valve: There was mild stenosis. There was trivial regurgitation. Valve area (Vmax): 1.53 cm^2. - Mitral valve: Calcified annulus. - Left atrium: The atrium was moderately to severely dilated. - Pulmonary arteries: PA peak pressure: 59 mm Hg (S). - Pericardium, extracardiac: A trivial pericardial effusion was identified. Features were not consistent with tamponade  ASSESSMENT AND PLAN:  1. Elevated troponin: In setting of acute infectious process. He does have diabetes and known CAD.  Troponin is trending down. For now, would continue heparin drip. Continue ASA, statin, beta blocker, Imdur. Echo 02/12/14 with normal LV function. Will consider invasive evaluation with cardiac cath when afebrile if renal function  is stable and anemia is stable. No urgent indication for cath.   2. CAD s/p CABG: See above.   Raeshawn Vo  12/31/20159:04 AM

## 2014-02-13 NOTE — Progress Notes (Addendum)
ANTICOAGULATION CONSULT NOTE - Follow Up Consult  Pharmacy Consult for Heparin Indication: chest pain/ACS  Allergies  Allergen Reactions  . Morphine And Related     Pt felt flushed and was diaphoretic     Patient Measurements: Height: 5\' 5"  (165.1 cm) Weight: 194 lb 7.1 oz (88.2 kg) IBW/kg (Calculated) : 61.5 Heparin Dosing Weight: 80kg  Vital Signs: Temp: 99.6 F (37.6 C) (12/31 0730) Temp Source: Oral (12/31 0730) BP: 162/73 mmHg (12/31 0730) Pulse Rate: 77 (12/31 0730)  Labs:  Recent Labs  02/10/14 1206 02/11/14 1429  02/12/14 0155 02/12/14 0323 02/12/14 1045 02/12/14 1532 02/12/14 2045 02/13/14 0005 02/13/14 0720  HGB 10.7* 10.9*  --  9.6*  --   --   --   --   --  8.9*  HCT 34.0* 35.0*  --  31.0*  --   --   --   --   --  28.6*  PLT 297 293  --  248  --   --   --   --   --  207  LABPROT  --   --   --   --  15.0  --   --   --   --   --   INR  --   --   --   --  1.17  --   --   --   --   --   HEPARINUNFRC  --   --   --   --   --   --   --  0.12*  --  0.13*  CREATININE 1.12 1.36*  --  1.67*  --   --   --   --   --   --   TROPONINI  --   --   < > 8.01*  --  3.07* 2.31*  --  1.32*  --   < > = values in this interval not displayed.  Estimated Creatinine Clearance: 45.6 mL/min (by C-G formula based on Cr of 1.67).   Medications:  Heparin @ 1300 units/hr  Assessment: 63yom continues on heparin for elevated troponins, which are trending down. Heparin level is still below goal despite rate increase yesterday. Hgb trending down, platelets stable. No bleeding reported. Noted plan for possible cath once infection resolves.   Goal of Therapy:  Heparin level 0.3-0.7 units/ml Monitor platelets by anticoagulation protocol: Yes   Plan:  1) Re-bolus heparin 2500 units x 1 2) Increase heparin to 1600 units/hr 3) Check 6 hour heparin level  Fredrik RiggerMarkle, Jennifer Sue 02/13/2014,10:34 AM  Addendum: Repeat Heparin level remains low at 0.16 (slight increase). No issues per  RN.   Plan: Re-bolus heparin of 3000 units Increase heparin to 1900 units/hr Repeat heparin level in 6 hours  Link SnufferJessica Millen, PharmD, BCPS Clinical Pharmacist [phone] 02/13/2014, 5:07 PM   Addendum: Heparin level is now therapeutic at 0.41. RN reports no s/s of bleeding. Continue heparin infusion at 1900 units/hr. F/u 6 hr HL.  Vinnie LevelBenjamin Wyllow Seigler, PharmD., BCPS Clinical Pharmacist Pager [phone]

## 2014-02-13 NOTE — Progress Notes (Addendum)
[facility] PROGRESS NOTE  Filed Weights   02/11/14 1328 02/11/14 2059 02/12/14 0535  Weight: 89.812 kg (198 lb) 90 kg (198 lb 6.6 oz) 88.2 kg (194 lb 7.1 oz)        Intake/Output Summary (Last 24 hours) at 02/13/14 0756 Last data filed at 02/13/14 0747  Gross per 24 hour  Intake 3222.66 ml  Output    150 ml  Net 3072.66 ml     Assessment/Plan: Acute respiratory failure with hypoxia - Unclear etiology, BNP was elevated, with a significant elevation in WBC and fever. Likely due to HCAP and ? NSTEMI - Currently satting 100% on 2 L. WBC improving. CXR showed improved aeration.  Sepsis? Due groin abscess: - Pelvic US showed as below. - Empiric vancomycin and cefepime started on 12.30.2015 - Blood cultures, HIV and influenza PCR negative. - US of groin b/l showed abscess. Consult vascular surgery. - Tylenol for fever.  AKI (acute kidney injury)/acute toxic nephropathy: - Multifactorial due to sepsis, ACE inhibitor and contrast-induced nephropathy. - I will go ahead and stop ace inhibitors, cont  IV hydration. - Basic metabolic panel in the morning pending  NSTEMI (non-ST elevated myocardial infarction) - Cardiac markers trending down. Currently on heparin and aspirin, statin and beta blockers. - Cardiology on board,recommended an ischemic evaluation Echo as below  Hyponatremia - Urinary sodium and urinary creatinine pending. - I don't appreciate any JVD, no lower extremity edema. He does have crackles on the right lung base. - Cont gentle hydration.  Chronic obstructive pulmonary disease - Stable.  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  Obesity - Counseling.  Essential hypertension - We'll start him on Imdur. - Blood pressure mildly high.    Code Status: Full code  DVT Prophylaxis: Heparin Family Communication: I discussed the plan with the  patient at the bedside.  Disposition Plan: Home when medically stable.    Consultants:  Cardiology  Procedures: ECHO: EF normal no wall motion. Pelvic US 12.30.2015: Large 11.4 x 10.0 x 4.6 cm right groin complex fluid collection noted  Antibiotics:  Vanco and cefepime 02/11/2014  HPI/Subjective: Having fever.  Objective: Filed Vitals:   02/12/14 2331 02/13/14 0000 02/13/14 0336 02/13/14 0400  BP: 138/42  109/38   Pulse: 86 74  72  Temp: 99.1 F (37.3 C)  98.6 F (37 C)   TempSrc: Oral  Oral   Resp:  31  24  Height:      Weight:      SpO2:  95%  100%     Exam:  General: Alert, awake, oriented x3, in no acute distress.  HEENT: No bruits, no goiter. Negative JVD Heart: Regular rate and rhythm. Lungs: Good air movement, crackles on the right Abdomen: Soft, nontender, nondistended, positive bowel sounds.  Skin: Bilateral indurated of groins but no fluctuating masses on the groin with some superficial dilatation of the skin without any purulent drainage no warm to touch or erythematous.   Data Reviewed: Basic Metabolic Panel:  Recent Labs Lab 02/10/14 1206 02/11/14 1429 02/12/14 0155  NA 139 137 132*  K 4.3 4.1 3.8  CL 102 102 102  CO2 28 28 23   GLUCOSE 234* 235* 188*  BUN 10 10 16   CREATININE 1.12 1.36* 1.67*  CALCIUM 9.6 9.6 8.4   Liver Function Tests:  Recent Labs Lab 02/10/14 1206 02/11/14 1429 02/12/14 0155  AST 14 15 34  ALT  12 15 16   ALKPHOS 63 61 50  BILITOT 0.7 1.1 1.2  PROT 6.9 7.7 6.2  ALBUMIN 3.6 3.6 2.9*   No results for input(s): LIPASE, AMYLASE in the last 168 hours. No results for input(s): AMMONIA in the last 168 hours. CBC:  Recent Labs Lab 02/10/14 1206 02/11/14 1429 02/12/14 0155 02/13/14 0720  WBC 5.4 17.0* 17.5* 15.4*  NEUTROABS 3.0 15.3*  --   --   HGB 10.7* 10.9* 9.6* 8.9*  HCT 34.0* 35.0* 31.0* 28.6*  MCV 84.6 87.3 86.4 85.4  PLT 297 293 248 207   Cardiac Enzymes:  Recent Labs Lab 02/12/14 0155  02/12/14 1045 02/12/14 1532 02/13/14 0005  TROPONINI 8.01* 3.07* 2.31* 1.32*   BNP (last 3 results) No results for input(s): PROBNP in the last 8760 hours. CBG:  Recent Labs Lab 02/12/14 0103 02/12/14 0742 02/12/14 1140 02/12/14 1638 02/12/14 2213  GLUCAP 186* 130* 192* 168* 210*    Recent Results (from the past 240 hour(s))  Blood culture (routine x 2)     Status: None (Preliminary result)   Collection Time: 02/11/14  2:29 PM  Result Value Ref Range Status   Specimen Description BLOOD RIGHT HAND  Final   Special Requests BOTTLES DRAWN AEROBIC AND ANAEROBIC 4CC  Final   Culture NO GROWTH 1 DAY  Final   Report Status PENDING  Incomplete  Blood culture (routine x 2)     Status: None (Preliminary result)   Collection Time: 02/11/14  2:33 PM  Result Value Ref Range Status   Specimen Description BLOOD RIGHT ANTECUBITAL  Final   Special Requests BOTTLES DRAWN AEROBIC ONLY 6CC  Final   Culture NO GROWTH 1 DAY  Final   Report Status PENDING  Incomplete  Urine culture     Status: None   Collection Time: 02/11/14  2:56 PM  Result Value Ref Range Status   Specimen Description URINE, CLEAN CATCH  Final   Special Requests NONE  Final   Colony Count NO GROWTH Performed at [facility]   Final   Culture NO GROWTH Performed at [facility]   Final   Report Status 02/12/2014 FINAL  Final  MRSA PCR Screening     Status: None   Collection Time: 02/11/14  6:40 PM  Result Value Ref Range Status   MRSA by PCR NEGATIVE NEGATIVE Final    Comment:        The GeneXpert MRSA Assay (FDA approved for NASAL specimens only), is one component of a comprehensive MRSA colonization surveillance program. It is not intended to diagnose MRSA infection nor to guide or monitor treatment for MRSA infections.      Studies: Dg Chest 2 View  02/11/2014   CLINICAL DATA:  Fever.  Hypoxia.  EXAM: CHEST  2 VIEW  COMPARISON:  07/15/2005 and 07/10/2005  FINDINGS: Pacemaker in place.  Previous median sternotomy. There is slight pulmonary vascular congestion but overall heart size is normal. The hilar structures are prominent, right more than left but I feel this is due to the vascular congestion. No effusions. No acute osseous abnormality.  IMPRESSION: Slight pulmonary vascular congestion.   Electronically Signed   By: Geanie [name] M.D.   On: 02/11/2014 15:53   Ct Angio Chest Pe W/cm &/or Wo Cm  02/11/2014   CLINICAL DATA:  Short of breath and hypoxia.  Chills and fever  EXAM: CT ANGIOGRAPHY CHEST WITH CONTRAST  TECHNIQUE: Multidetector CT imaging of the chest was performed using the standard protocol  during bolus administration of intravenous contrast. Multiplanar CT image reconstructions and MIPs were obtained to evaluate the vascular anatomy.  CONTRAST:  OMNIPAQUE IOHEXOL 350 MG/ML SOLN  COMPARISON:  None.  FINDINGS: Mediastinum: There is moderate cardiac enlargement. The patient is status post median sternotomy and CABG procedure. No pericardial effusion identified. The trachea appears patent and is midline. Normal appearance of the esophagus. Prominent AP window lymph node measures 1.4 cm, image number 35/series 6. The main pulmonary artery appears patent. No lobar or segmental pulmonary artery filling defects to suggest a clinically significant pulmonary embolus.  Lungs/Pleura: No airspace consolidation. Mild lower lobe predominant dependent changes are identified including subsegmental atelectasis.  Upper Abdomen: The visualized portions of the liver and spleen are normal. The adrenal glands are unremarkable. The visualized portions of the gallbladder are also normal.  Musculoskeletal: Mild multi level degenerative disc disease identified within the thoracic spine. There is a curvature of the thoracic spine which is convex towards the right.  Review of the MIP images confirms the above findings.  IMPRESSION: 1. No evidence for acute pulmonary embolus. 2. Cardiac enlargement  status post CABG procedure.   Electronically Signed   By: Signa Kell M.D.   On: 02/11/2014 17:21   US Pelvis Limited  02/12/2014   CLINICAL DATA:  Fever. Evaluate for abscess. Palpable abnormality right inguinal region.  EXAM: LIMITED ULTRASOUND OF PELVIS  TECHNIQUE: Limited transabdominal ultrasound examination of the pelvis was performed.  COMPARISON:  None.  FINDINGS: A large 11.4 x 10.0 x 4.6 cm right groin complex fluid collection is noted. This would be consistent with a abscess. To exclude other pathology including hernia, CT can be obtained if needed.  IMPRESSION: Large 11.4 x 10.0 x 4.6 cm right groin complex fluid collection noted. This would be consistent with an abscess. Further evaluation with CT can be obtained if needed .   Electronically Signed   By: Maisie Fus  Register   On: 02/12/2014 10:13   Dg Chest Port 1 View  02/12/2014   CLINICAL DATA:  Status post PICC line placement.  EXAM: PORTABLE CHEST - 1 VIEW  COMPARISON:  February 11, 2014.  FINDINGS: Stable cardiomediastinal silhouette. Sternotomy wires are noted. Stable mild central pulmonary vascular congestion. Right-sided pacemaker is unchanged in position. No pneumothorax or pleural effusion is noted. Interval placement of left-sided PICC line with distal tip overlying expected position of the SVC.  IMPRESSION: Interval placement of left-sided PICC line with distal tip overlying expected position of the SVC.   Electronically Signed   By: Roque Lias M.D.   On: 02/12/2014 14:52    Scheduled Meds: . aspirin  81 mg Oral Daily  . atorvastatin  80 mg Oral q1800  . ceFEPime (MAXIPIME) IV  1 g Intravenous Q8H  . cloNIDine  0.1 mg Transdermal Weekly  . ferrous sulfate  325 mg Oral BID  . gabapentin  300 mg Oral TID  . insulin aspart  0-20 Units Subcutaneous TID WC  . insulin aspart  0-5 Units Subcutaneous QHS  . insulin detemir  50 Units Subcutaneous Q2200  . isosorbide mononitrate  15 mg Oral Daily  . metoprolol  25 mg Oral BID    . nitroGLYCERIN  1 inch Topical 4 times per day  . oseltamivir  30 mg Oral BID  . sodium chloride  10-40 mL Intracatheter Q12H  . sodium chloride  3 mL Intravenous Q12H  . triamcinolone cream  1 application Topical BID  . vancomycin  750 mg Intravenous  Q12H   Continuous Infusions: . sodium chloride 75 mL/hr at 02/13/14 0700  . heparin 1,300 Units/hr (02/13/14 0700)     Marinda ElkFELIZ ORTIZ, Ritik Stavola  [facility] Pager [phone] If 8PM-8AM, please contact night-coverage at [email or web address], password [email or web address] 02/13/2014, 7:56 AM  LOS: 2 days

## 2014-02-14 ENCOUNTER — Inpatient Hospital Stay (HOSPITAL_COMMUNITY): Payer: Medicare HMO

## 2014-02-14 LAB — BASIC METABOLIC PANEL
ANION GAP: 6 (ref 5–15)
BUN: 23 mg/dL (ref 6–23)
CALCIUM: 7.9 mg/dL — AB (ref 8.4–10.5)
CO2: 23 mmol/L (ref 19–32)
CREATININE: 1.71 mg/dL — AB (ref 0.50–1.35)
Chloride: 104 mEq/L (ref 96–112)
GFR calc Af Amer: 47 mL/min — ABNORMAL LOW (ref >90)
GFR, EST NON AFRICAN AMERICAN: 41 mL/min — AB (ref >90)
Glucose, Bld: 97 mg/dL (ref 70–99)
Potassium: 4.2 mmol/L (ref 3.5–5.1)
Sodium: 133 mmol/L — ABNORMAL LOW (ref 135–145)

## 2014-02-14 LAB — CBC
HEMATOCRIT: 25.5 % — AB (ref 39.0–52.0)
HEMOGLOBIN: 7.8 g/dL — AB (ref 13.0–17.0)
MCH: 25.9 pg — AB (ref 26.0–34.0)
MCHC: 30.6 g/dL (ref 30.0–36.0)
MCV: 84.7 fL (ref 78.0–100.0)
Platelets: 189 10*3/uL (ref 150–400)
RBC: 3.01 MIL/uL — AB (ref 4.22–5.81)
RDW: 13.7 % (ref 11.5–15.5)
WBC: 10.7 10*3/uL — ABNORMAL HIGH (ref 4.0–10.5)

## 2014-02-14 LAB — GLUCOSE, CAPILLARY
GLUCOSE-CAPILLARY: 95 mg/dL (ref 70–99)
GLUCOSE-CAPILLARY: 136 mg/dL — AB (ref 70–99)
Glucose-Capillary: 142 mg/dL — ABNORMAL HIGH (ref 70–99)
Glucose-Capillary: 175 mg/dL — ABNORMAL HIGH (ref 70–99)

## 2014-02-14 LAB — VANCOMYCIN, TROUGH: VANCOMYCIN TR: 18.6 ug/mL (ref 10.0–20.0)

## 2014-02-14 LAB — HEPARIN LEVEL (UNFRACTIONATED): Heparin Unfractionated: 0.25 IU/mL — ABNORMAL LOW (ref 0.30–0.70)

## 2014-02-14 MED ORDER — HEPARIN BOLUS VIA INFUSION
1000.0000 [IU] | Freq: Once | INTRAVENOUS | Status: AC
  Administered 2014-02-14: 1000 [IU] via INTRAVENOUS
  Filled 2014-02-14: qty 1000

## 2014-02-14 MED ORDER — SODIUM CHLORIDE 0.9 % IV SOLN
Freq: Once | INTRAVENOUS | Status: AC
  Administered 2014-02-14: 13:00:00 via INTRAVENOUS

## 2014-02-14 MED ORDER — IOHEXOL 350 MG/ML SOLN
80.0000 mL | Freq: Once | INTRAVENOUS | Status: AC | PRN
  Administered 2014-02-14: 80 mL via INTRAVENOUS

## 2014-02-14 NOTE — Progress Notes (Signed)
ANTICOAGULATION CONSULT NOTE - Follow Up Consult  Pharmacy Consult for Heparin Indication: chest pain/ACS  Allergies  Allergen Reactions  . Morphine And Related     Pt felt flushed and was diaphoretic     Patient Measurements: Height:  (165.1 cm) Weight: 194 lb 7.1 oz (88.2 kg) IBW/kg (Calculated) : 61.5 Heparin Dosing Weight: 80kg  Vital Signs: Temp: 99.3 F (37.4 C) (01/01 0419) Temp Source: Oral (01/01 0419) BP: 106/37 mmHg (01/01 0419) Pulse Rate: 70 (01/01 0419)  Labs:  Recent Labs  02/12/14 0155 02/12/14 0323 02/12/14 1045 02/12/14 1532  02/13/14 0005 02/13/14 0720 02/13/14 0758 02/13/14 1615 02/13/14 2242 02/14/14 0400 02/14/14 0410  HGB 9.6*  --   --   --   --   --  8.9*  --   --   --   --  7.8*  HCT 31.0*  --   --   --   --   --  28.6*  --   --   --   --  25.5*  PLT 248  --   --   --   --   --  207  --   --   --   --  189  LABPROT  --  15.0  --   --   --   --   --   --   --   --   --   --   INR  --  1.17  --   --   --   --   --   --   --   --   --   --   HEPARINUNFRC  --   --   --   --   < >  --  0.13*  --  0.16* 0.41 0.25*  --   CREATININE 1.67*  --   --   --   --   --   --  1.89*  --   --   --  1.71*  TROPONINI 8.01*  --  3.07* 2.31*  --  1.32*  --   --   --   --   --   --   < > = values in this interval not displayed.  Estimated Creatinine Clearance: 44.6 mL/min (by C-G formula based on Cr of 1.71).   Medications:  Heparin @ 1900 units/hr  Assessment: 63yom continues on heparin for elevated troponins, which are trending down. Heparin level was therapeutic but is now sub-therapeutic at 0.25. Hgb trending down, platelets stable. RN states that drip was not stopped for any reason. No s/s of bleeding reported. Noted plan for possible cath once infection resolves.   Goal of Therapy:  Heparin level 0.3-0.7 units/ml Monitor platelets by anticoagulation protocol: Yes   Plan: -Heparin 1000 units bolus followed by increased heparin infusion rate  of 2050 units/hr.  -F/u 6 hr HL -Monitor daily HL, s/s of bleeding and CBC  Vinnie Level, PharmD., BCPS Clinical Pharmacist Pager [phone]

## 2014-02-14 NOTE — Progress Notes (Signed)
     SUBJECTIVE: No chest pain or SOB.   BP 134/82 mmHg  Pulse 78  Temp(Src) 98.9 F (37.2 C) (Oral)  Resp 25  Ht  (1.651 m)  Wt 194 lb 7.1 oz (88.2 kg)  BMI 32.36 kg/m2  SpO2 100%  Intake/Output Summary (Last 24 hours) at 02/14/14 0825 Last data filed at 02/14/14 0700  Gross per 24 hour  Intake 2685.4 ml  Output    700 ml  Net 1985.4 ml    PHYSICAL EXAM General: Well developed, well nourished, in no acute distress. Alert and oriented x 3.  Psych:  Good affect, responds appropriately Neck: No JVD. No masses noted.  Lungs: Clear bilaterally with no wheezes or rhonci noted.  Heart: RRR with no murmurs noted. Abdomen: Bowel sounds are present. Soft, non-tender.  Extremities: No lower extremity edema.   LABS: Basic Metabolic Panel:  Recent Labs  40/98/11 0758 02/14/14 0410  NA 134* 133*  K 4.2 4.2  CL 104 104  CO2 25 23  GLUCOSE 131* 97  BUN 24* 23  CREATININE 1.89* 1.71*  CALCIUM 8.0* 7.9*   CBC:  Recent Labs  02/11/14 1429  02/13/14 0720 02/14/14 0410  WBC 17.0*  < > 15.4* 10.7*  NEUTROABS 15.3*  --   --   --   HGB 10.9*  < > 8.9* 7.8*  HCT 35.0*  < > 28.6* 25.5*  MCV 87.3  < > 85.4 84.7  PLT 293  < > 207 189  < > = values in this interval not displayed. Cardiac Enzymes:  Recent Labs  02/12/14 1045 02/12/14 1532 02/13/14 0005  TROPONINI 3.07* 2.31* 1.32*   Current Meds: . aspirin  81 mg Oral Daily  . atorvastatin  80 mg Oral q1800  . ceFEPime (MAXIPIME) IV  1 g Intravenous Q24H  . cloNIDine  0.1 mg Transdermal Weekly  . ferrous sulfate  325 mg Oral BID  . gabapentin  300 mg Oral TID  . insulin aspart  0-20 Units Subcutaneous TID WC  . insulin aspart  0-5 Units Subcutaneous QHS  . insulin detemir  50 Units Subcutaneous Q2200  . isosorbide mononitrate  15 mg Oral Daily  . metoprolol  25 mg Oral BID  . oseltamivir  30 mg Oral BID  . sodium chloride  10-40 mL Intracatheter Q12H  . sodium chloride  3 mL Intravenous Q12H  .  triamcinolone cream  1 application Topical BID  . vancomycin  750 mg Intravenous Q12H   ASSESSMENT AND PLAN:  1. Elevated troponin: In setting of acute infectious process. He does have diabetes and known CAD. Troponin is trending down. I do not think this is an acute coronary syndrome. Will drop in Hgb, could stop IV heparin today. Continue ASA, statin, beta blocker, Imdur. Echo 02/12/14 with normal LV function. Will need ischemic evaluation when his sepsis is resolved. Given renal dysfunction and anemia, may be best to start with a stress myoview.    2. CAD s/p CABG: See above.    Shastina Rua  1/1/20168:25 AM

## 2014-02-14 NOTE — Progress Notes (Signed)
[facility] PROGRESS NOTE  Filed Weights   02/11/14 1328 02/11/14 2059 02/12/14 0535  Weight: 89.812 kg (198 lb) 90 kg (198 lb 6.6 oz) 88.2 kg (194 lb 7.1 oz)        Intake/Output Summary (Last 24 hours) at 02/14/14 0838 Last data filed at 02/14/14 0700  Gross per 24 hour  Intake 2685.4 ml  Output    700 ml  Net 1985.4 ml     Assessment/Plan: Acute respiratory failure with hypoxia - Unclear etiology, BNP was elevated, with a significant elevation in WBC and fever. Likely due to HCAP and ? NSTEMI - Currently satting 100% on 2 L. WBC improving. CXR showed improved aeration.  Sepsis? Due groin abscess: - [place] of groin b/l showed abscess. Consult vascular surgery. - Empiric vancomycin and cefepime started on 12.30.2015 - Blood cultures negative till date, HIV and influenza PCR negative. - Afebrile sepsis improving.  AKI (acute kidney injury)/acute toxic nephropathy: - Multifactorial due to sepsis, ACE inhibitor and contrast-induced nephropathy. - I will go ahead and stop ace inhibitors, cont  IV hydration. - Cr cont to improve.  Elevated Troponin's: - Cardiac markers trending down. Cards rec to stop heparin. Cont aspirin, statin and beta blockers. - Cardiology on board,recommended an ischemic as an outpatient - Echo as below  Hyponatremia - I don't appreciate any JVD, no lower extremity edema.  - Cont gentle hydration.  Chronic obstructive pulmonary disease - Stable.  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  Obesity - Counseling.  Essential hypertension - We'll start him on Imdur. - Blood pressure mildly high.    Code Status: Full code  DVT Prophylaxis: Heparin Family Communication: I discussed the plan with the patient at the bedside.  Disposition Plan: Home when medically stable.    Consultants:  Cardiology  Procedures: ECHO: EF  normal no wall motion. Pelvic US 12.30.2015: Large 11.4 x 10.0 x 4.6 cm right groin complex fluid collection noted  Antibiotics:  Vanco and cefepime 02/11/2014  HPI/Subjective: Feels better no complains  Objective: Filed Vitals:   02/13/14 2300 02/14/14 0419 02/14/14 0700 02/14/14 0758  BP: 111/95 106/37  134/82  Pulse: 65 70  78  Temp: 100.2 F (37.9 C) 99.3 F (37.4 C)  98.9 F (37.2 C)  TempSrc: Oral Oral  Oral  Resp: Height:      Weight:      SpO2: 100% 100% 100% 100%     Exam:  General: Alert, awake, oriented x3, in no acute distress.  HEENT: No bruits, no goiter. Negative JVD Heart: Regular rate and rhythm. Lungs: Good air movement, crackles on the right Abdomen: Soft, nontender, nondistended, positive bowel sounds.  Skin: Bilateral indurated of groins but no fluctuating masses on the groin with some superficial dilatation of the skin without any purulent drainage no warm to touch or erythematous.   Data Reviewed: Basic Metabolic Panel:  Recent Labs Lab 02/10/14 1206 02/11/14 1429 02/12/14 0155 02/13/14 0758 02/14/14 0410  NA 139 137 132* 134* 133*  K 4.3 4.1 3.8 4.2 4.2  CL 102 102 102 104 104  CO2 GLUCOSE 234* 235* 188* 131* 97  BUN 24* 23  CREATININE 1.12 1.36* 1.67* 1.89* 1.71*  CALCIUM 9.6 9.6 8.4 8.0* 7.9*   Liver Function Tests:  Recent Labs Lab 02/10/14 1206 02/11/14 1429 02/12/14 0155  AST 14 15 34  ALT ALKPHOS 63 61 50  BILITOT 0.7 1.1 1.2  PROT 6.9 7.7 6.2  ALBUMIN 3.6 3.6 2.9*   No results for input(s): LIPASE, AMYLASE in the last 168 hours. No results for input(s): AMMONIA in the last 168 hours. CBC:  Recent Labs Lab 02/10/14 1206 02/11/14 1429 02/12/14 0155 02/13/14 0720 02/14/14 0410  WBC 5.4 17.0* 17.5* 15.4* 10.7*  NEUTROABS 3.0 15.3*  --   --   --   HGB 10.7* 10.9* 9.6* 8.9* 7.8*  HCT 34.0* 35.0* 31.0* 28.6* 25.5*  MCV 84.6 87.3 86.4 85.4 84.7  PLT 297 293 248  207 189   Cardiac Enzymes:  Recent Labs Lab 02/12/14 0155 02/12/14 1045 02/12/14 1532 02/13/14 0005  TROPONINI 8.01* 3.07* 2.31* 1.32*   BNP (last 3 results) No results for input(s): PROBNP in the last 8760 hours. CBG:  Recent Labs Lab 02/13/14 0805 02/13/14 1154 02/13/14 1643 02/13/14 2120 02/14/14 0800  GLUCAP 138* 125* 189* 139* 95    Recent Results (from the past 240 hour(s))  Blood culture (routine x 2)     Status: None (Preliminary result)   Collection Time: 02/11/14  2:29 PM  Result Value Ref Range Status   Specimen Description BLOOD RIGHT HAND  Final   Special Requests BOTTLES DRAWN AEROBIC AND ANAEROBIC 4CC  Final   Culture NO GROWTH 2 DAYS  Final   Report Status PENDING  Incomplete  Blood culture (routine x 2)     Status: None (Preliminary result)   Collection Time: 02/11/14  2:33 PM  Result Value Ref Range Status   Specimen Description BLOOD RIGHT ANTECUBITAL  Final   Special Requests BOTTLES DRAWN AEROBIC ONLY 6CC  Final   Culture NO GROWTH 2 DAYS  Final   Report Status PENDING  Incomplete  Urine culture     Status: None   Collection Time: 02/11/14  2:56 PM  Result Value Ref Range Status   Specimen Description URINE, CLEAN CATCH  Final   Special Requests NONE  Final   Colony Count NO GROWTH Performed at [facility]   Final   Culture NO GROWTH Performed at [facility]   Final   Report Status 02/12/2014 FINAL  Final  MRSA PCR Screening     Status: None   Collection Time: 02/11/14  6:40 PM  Result Value Ref Range Status   MRSA by PCR NEGATIVE NEGATIVE Final    Comment:        The GeneXpert MRSA Assay (FDA approved for NASAL specimens only), is one component of a comprehensive MRSA colonization surveillance program. It is not intended to diagnose MRSA infection nor to guide or monitor treatment for MRSA infections.      Studies: US Pelvis Limited  02/12/2014   CLINICAL DATA:  Fever. Evaluate for abscess. Palpable  abnormality right inguinal region.  EXAM: LIMITED ULTRASOUND OF PELVIS  TECHNIQUE: Limited transabdominal ultrasound examination of the pelvis was performed.  COMPARISON:  None.  FINDINGS: A large 11.4 x 10.0 x 4.6 cm right groin complex fluid collection is noted. This would be consistent with a abscess. To exclude other pathology including hernia, CT can be obtained if needed.  IMPRESSION: Large 11.4 x 10.0 x 4.6 cm right groin complex fluid collection noted. This would be consistent with an abscess. Further evaluation with CT can be obtained if needed .   Electronically Signed   By: Maisie Fus  Register   On: 02/12/2014 10:13  Dg Chest Port 1 View  02/12/2014   CLINICAL DATA:  Status post PICC line placement.  EXAM: PORTABLE CHEST - 1 VIEW  COMPARISON:  February 11, 2014.  FINDINGS: Stable cardiomediastinal silhouette. Sternotomy wires are noted. Stable mild central pulmonary vascular congestion. Right-sided pacemaker is unchanged in position. No pneumothorax or pleural effusion is noted. Interval placement of left-sided PICC line with distal tip overlying expected position of the SVC.  IMPRESSION: Interval placement of left-sided PICC line with distal tip overlying expected position of the SVC.   Electronically Signed   By: Roque Lias M.D.   On: 02/12/2014 14:52    Scheduled Meds: . aspirin  81 mg Oral Daily  . atorvastatin  80 mg Oral q1800  . ceFEPime (MAXIPIME) IV  1 g Intravenous Q24H  . cloNIDine  0.1 mg Transdermal Weekly  . ferrous sulfate  325 mg Oral BID  . gabapentin  300 mg Oral TID  . insulin aspart  0-20 Units Subcutaneous TID WC  . insulin aspart  0-5 Units Subcutaneous QHS  . insulin detemir  50 Units Subcutaneous Q2200  . isosorbide mononitrate  15 mg Oral Daily  . metoprolol  25 mg Oral BID  . oseltamivir  30 mg Oral BID  . sodium chloride  10-40 mL Intracatheter Q12H  . sodium chloride  3 mL Intravenous Q12H  . triamcinolone cream  1 application Topical BID  . vancomycin   750 mg Intravenous Q12H   Continuous Infusions: . sodium chloride 75 mL/hr at 02/13/14 1900  . heparin 2,050 Units/hr (02/14/14 0544)     Marinda Elk  [facility] Pager [phone] If 8PM-8AM, please contact night-coverage at [email or web address], password [facility] 02/14/2014, 8:38 AM  LOS: 3 days

## 2014-02-14 NOTE — Progress Notes (Signed)
Pt. Refused bipap. RT informed pt. To notify if he changes his mind. 

## 2014-02-14 NOTE — Progress Notes (Signed)
ANTIBIOTIC CONSULT NOTE - FOLLOW UP  Pharmacy Consult for Vancomycin and Cefepime  Indication: HCAP  Allergies  Allergen Reactions  . Morphine And Related     Pt felt flushed and was diaphoretic     Patient Measurements: Height:  (165.1 cm) Weight: 194 lb 7.1 oz (88.2 kg) IBW/kg (Calculated) : 61.5  Vital Signs: Temp: 99.1 F (37.3 C) (01/01 0948) Temp Source: Oral (01/01 0948) BP: 150/59 mmHg (01/01 0948) Pulse Rate: 69 (01/01 0948) Intake/Output from previous day: 12/31 0701 - 01/01 0700 In: 2839.4 [P.O.:920; I.V.:1519.4; IV Piggyback:400] Out: 700 [Urine:700] Intake/Output from this shift: Total I/O In: 270.5 [P.O.:240; I.V.:30.5] Out: -   Labs:  Recent Labs  02/12/14 0155 02/13/14 0720 02/13/14 0758 02/14/14 0410  WBC 17.5* 15.4*  --  10.7*  HGB 9.6* 8.9*  --  7.8*  PLT 248 207  --  189  CREATININE 1.67*  --  1.89* 1.71*   Estimated Creatinine Clearance: 44.6 mL/min (by C-G formula based on Cr of 1.71).  Recent Labs  02/14/14 1645  VANCOTROUGH 18.6     Microbiology: Recent Results (from the past 720 hour(s))  Blood culture (routine x 2)     Status: None (Preliminary result)   Collection Time: 02/11/14  2:29 PM  Result Value Ref Range Status   Specimen Description BLOOD RIGHT HAND  Final   Special Requests BOTTLES DRAWN AEROBIC AND ANAEROBIC 4CC  Final   Culture NO GROWTH 2 DAYS  Final   Report Status PENDING  Incomplete  Blood culture (routine x 2)     Status: None (Preliminary result)   Collection Time: 02/11/14  2:33 PM  Result Value Ref Range Status   Specimen Description BLOOD RIGHT ANTECUBITAL  Final   Special Requests BOTTLES DRAWN AEROBIC ONLY 6CC  Final   Culture NO GROWTH 2 DAYS  Final   Report Status PENDING  Incomplete  Urine culture     Status: None   Collection Time: 02/11/14  2:56 PM  Result Value Ref Range Status   Specimen Description URINE, CLEAN CATCH  Final   Special Requests NONE  Final   Colony Count NO  GROWTH Performed at [facility]   Final   Culture NO GROWTH Performed at [facility]   Final   Report Status 02/12/2014 FINAL  Final  MRSA PCR Screening     Status: None   Collection Time: 02/11/14  6:40 PM  Result Value Ref Range Status   MRSA by PCR NEGATIVE NEGATIVE Final    Comment:        The GeneXpert MRSA Assay (FDA approved for NASAL specimens only), is one component of a comprehensive MRSA colonization surveillance program. It is not intended to diagnose MRSA infection nor to guide or monitor treatment for MRSA infections.     Anti-infectives    Start     Dose/Rate Route Frequency Ordered Stop   02/14/14 0730  ceFEPIme (MAXIPIME) 1 g in dextrose 5 % 50 mL IVPB     1 g100 mL/hr over 30 Minutes Intravenous Every 24 hours 02/13/14 1035     02/12/14 1000  oseltamivir (TAMIFLU) capsule 75 mg  Status:  Discontinued     75 mg Oral 2 times daily 02/12/14 0743 02/12/14 0747   02/12/14 1000  oseltamivir (TAMIFLU) capsule 30 mg     30 mg Oral 2 times daily 02/12/14 0749 02/17/14 0959   02/12/14 0600  vancomycin (VANCOCIN) IVPB 750 mg/150 ml premix  750 mg150 mL/hr over 60 Minutes Intravenous Every 12 hours 02/11/14 2047     02/12/14 0000  ceFEPIme (MAXIPIME) 1 g in dextrose 5 % 50 mL IVPB  Status:  Discontinued     1 g100 mL/hr over 30 Minutes Intravenous Every 8 hours 02/11/14 2047 02/13/14 1035   02/11/14 1545  vancomycin (VANCOCIN) 1,500 mg in sodium chloride 0.9 % 500 mL IVPB     1,500 mg250 mL/hr over 120 Minutes Intravenous  Once 02/11/14 1537 02/11/14 1940   02/11/14 1545  ceFEPIme (MAXIPIME) 2 g in dextrose 5 % 50 mL IVPB     2 g100 mL/hr over 30 Minutes Intravenous  Once 02/11/14 1537 02/11/14 1642     Assessment: Pt admitted with Acute respiratory failure with hypoxemia.  Started on broad spectrum ABX - vancomycin and cefepime for HCAP.  Vancomycin trough = 18.6 - at goal.  WBC improving 15> 10, Tm 10212/30 improved Tc 99.1, renal function  stable.     Goal of Therapy:  Vancomycin trough level 15-20 mcg/ml  Plan:  Continue Cefepime 1gm IV every 8 hours. Vancomycin  IV every 12 hours.  Leota Sauers Pharm.D. CPP, BCPS Clinical Pharmacist [phone] 02/14/2014 6:18 PM

## 2014-02-14 NOTE — Consult Note (Addendum)
[facility] [place] HISTORY AND PHYSICAL   History of Present Illness:  Patient is a 64 y.o. year old male who presents for evaluation of possible right groin abscess.  Pt had aortobifem by Dr Madilyn Fireman 2007.  Recently had occlusion of the left limb and this was revised by Dr Edilia Bo mid November of this year.  Had revision of the right limb at the same setting due to high grade stenosis. He also has had prior leg bypasses which are known to be chronically occluded.  Was admitted a few days ago with fever and dyspnea and thought to have coronary event vs sepsis/pneumonia.  Had troponin elevation to 8.  Pt transferred out of CCU today overall stable.  He has been on cefipime and vancomycin.  He is also on heparin.  He has has some intermittent groin drainage and was noted on ultrasound yesterday to have 10 cm right groin fluid collection.  Pt states both groins have been sore right > left.  Other medical problems include CAD prior CABG/pacemaker, DM, hyperlipid, prior CEA, neuropathy, COPD, hypertension, arthritis, obesity, sleep apnea, history of tobacco abuse.  All of these are currently stable.  Past Medical History  Diagnosis Date  . Arteriosclerotic cardiovascular disease (ASCVD)     CABG in 2003 for 3 VD; 07/2007: Stress nuclear-inferior infarction without ischemia; normal EF by echo in 06/2005  . Diabetes mellitus type II   . Hyperlipidemia   . Degenerative joint disease     Left knee  . Cerebrovascular disease 2003    Carotid ultrasound in 2010: Patent right CEA site, 60-79% left internal carotid artery  . Peripheral vascular disease 2007    aortobifemoral BPG-2007  . Anemia   . Peripheral neuropathy   . Hypertension   . Sick sinus syndrome 1996    permanent pacemaker-1996; generator change in 2010  . Chronic obstructive pulmonary disease   . Obesity   . Obstructive sleep apnea   . Benign prostatic hypertrophy   . Gastroesophageal reflux disease   . Tobacco abuse, in  remission     50 pack years total consumption; Quit in 2007    Past Surgical History  Procedure Laterality Date  . Coronary artery bypass graft  2003    three-vessel disease  . Iliac artery stent      Percutaneous intervention-right external iliac; unclear whether stent was placed  . Pacemaker insertion  1996    generator change in 2010  . Carotid endarterectomy  2003    Right  . Pacemaker insertion      Generator replaced in 04/2008  . Aorta - bilateral femoral artery bypass graft  2007  . Femoral-popliteal bypass graft  1998    bilateral  . Colonoscopy  2012    Negative screening study.  . Ileocolonoscopy  02/20/2007    [id number] rectum, colon, and terminal ileum  . Femoral-femoral bypass graft Bilateral 01/03/2014    Procedure: BYPASS GRAFT RIGHT FEMORAL-LEFT FEMORAL ARTERY ;  Surgeon: Chuck Hint, MD;  Location: [facility] OR;  Service: Vascular;  Laterality: Bilateral;  . Endarterectomy femoral Left 01/03/2014    Procedure: ENDARTERECTOMY FEMORAL;  Surgeon: Chuck Hint, MD;  Location: [facility] OR;  Service: Vascular;  Laterality: Left;  . Intraoperative arteriogram Left 01/03/2014    Procedure: INTRA OPERATIVE ARTERIOGRAM, LEFT LOWER LEG;  Surgeon: Chuck Hint, MD;  Location: [facility] OR;  Service: Vascular;  Laterality: Left;  . Abdominal angiogram N/A 01/02/2014    Procedure: ABDOMINAL ANGIOGRAM;  Surgeon:  Fransisco Hertz, MD;  Location: [facility] CATH LAB;  Service: Cardiovascular;  Laterality: N/A;  . Peripheral vascular catheterization  01/02/2014    Procedure: LOWER EXTREMITY ANGIOGRAPHY;  Surgeon: Fransisco Hertz, MD;  Location: [facility] CATH LAB;  Service: Cardiovascular;;  . Arch aortogram  01/02/2014    Procedure: ARCH AORTOGRAM;  Surgeon: Fransisco Hertz, MD;  Location: [facility] CATH LAB;  Service: Cardiovascular;;    Social History History  Substance Use Topics  . Smoking status: Former Smoker -- 2.00 packs/day for 25 years    Types: Cigarettes    Quit date: 01/22/2005  .  Smokeless tobacco: Former Neurosurgeon    Quit date: 02/16/1996  . Alcohol Use: No    Family History Family History  Problem Relation Age of Onset  . Dementia Mother   . Heart attack Father   . Coronary artery disease Father   . Peripheral vascular disease Sister   . Diabetes Brother   . Peripheral vascular disease Brother   . Hypertension Brother   . Coronary artery disease Brother     Allergies  Allergies  Allergen Reactions  . Morphine And Related     Pt felt flushed and was diaphoretic      Current Facility-Administered Medications  Medication Dose Route Frequency Provider Last Rate Last Dose  . 0.9 %  sodium chloride infusion   Intravenous Continuous Marinda Elk, MD 10 mL/hr at 02/14/14 0900    . 0.9 %  sodium chloride infusion   Intravenous Once Sherren Kerns, MD      . acetaminophen (TYLENOL) tablet 1,000 mg  1,000 mg Oral Q6H PRN Wilson Singer, MD   1,000 mg at 02/14/14 0025  . aspirin chewable tablet 81 mg  81 mg Oral Daily Wilson Singer, MD   81 mg at 02/14/14 0903  . atorvastatin (LIPITOR) tablet 80 mg  80 mg Oral q1800 Leeann Must, MD   80 mg at 02/13/14 1700  . ceFEPIme (MAXIPIME) 1 g in dextrose 5 % 50 mL IVPB  1 g Intravenous Q24H Hessie Diener Pocahontas, RPH   1 g at 02/14/14 0636  . cloNIDine (CATAPRES - Dosed in mg/24 hr) patch 0.1 mg  0.1 mg Transdermal Weekly Nimish C Gosrani, MD   0.1 mg at 02/12/14 1204  . ferrous sulfate tablet 325 mg  325 mg Oral BID Wilson Singer, MD   325 mg at 02/14/14 0902  . gabapentin (NEURONTIN) capsule 300 mg  300 mg Oral TID Wilson Singer, MD   300 mg at 02/14/14 0903  . insulin aspart (novoLOG) injection 0-20 Units  0-20 Units Subcutaneous TID WC Wilson Singer, MD   4 Units at 02/13/14 1659  . insulin aspart (novoLOG) injection 0-5 Units  0-5 Units Subcutaneous QHS Wilson Singer, MD   2 Units at 02/12/14 2335  . insulin detemir (LEVEMIR) injection 50 Units  50 Units Subcutaneous Q2200 Wilson Singer, MD   50  Units at 02/13/14 2247  . ipratropium-albuterol (DUONEB) 0.5-2.5 (3) MG/3ML nebulizer solution 3 mL  3 mL Nebulization Q4H PRN Nimish Normajean Glasgow, MD   3 mL at 02/14/14 0533  . isosorbide mononitrate (IMDUR) 24 hr tablet 15 mg  15 mg Oral Daily Marinda Elk, MD   15 mg at 02/14/14 0903  . metoprolol tartrate (LOPRESSOR) tablet 25 mg  25 mg Oral BID Wilson Singer, MD   25 mg at 02/14/14 0903  . ondansetron (ZOFRAN) tablet 4 mg  4  mg Oral Q6H PRN Wilson Singer, MD   4 mg at 02/12/14 1151   Or  . ondansetron (ZOFRAN) injection 4 mg  4 mg Intravenous Q6H PRN Nimish C Gosrani, MD      . oseltamivir (TAMIFLU) capsule 30 mg  30 mg Oral BID Marinda Elk, MD   30 mg at 02/14/14 0903  . sodium chloride 0.9 % injection 10-40 mL  10-40 mL Intracatheter Q12H Marinda Elk, MD   10 mL at 02/13/14 0931  . sodium chloride 0.9 % injection 10-40 mL  10-40 mL Intracatheter PRN Marinda Elk, MD      . sodium chloride 0.9 % injection 3 mL  3 mL Intravenous Q12H Nimish C Gosrani, MD   3 mL at 02/14/14 1000  . traMADol (ULTRAM) tablet 50 mg  50 mg Oral Q6H PRN Wilson Singer, MD   50 mg at 02/11/14 2256  . triamcinolone cream (KENALOG) 0.1 % 1 application  1 application Topical BID Wilson Singer, MD   1 application at 02/13/14 2215  . vancomycin (VANCOCIN) IVPB 750 mg/150 ml premix  750 mg Intravenous Q12H Nimish C Gosrani, MD   750 mg at 02/14/14 0422    ROS:   General:  No weight loss, +Fever, chills  HEENT: No recent headaches, no nasal bleeding, no visual changes, no sore throat  Neurologic: No dizziness, blackouts, seizures. No recent symptoms of stroke or mini- stroke. No recent episodes of slurred speech, or temporary blindness.  Cardiac: No recent episodes of chest pain/pressure, + shortness of breath at rest.  + shortness of breath with exertion.  Denies history of atrial fibrillation or irregular heartbeat  Vascular: No history of rest pain in feet.  No history of  claudication.  No history of non-healing ulcer, No history of DVT   Pulmonary: No home oxygen, no productive cough, no hemoptysis,  No asthma or wheezing  Musculoskeletal:  [x ] Arthritis, [ ]  Low back pain,  [x ] Joint pain  Hematologic:No history of hypercoagulable state.  No history of easy bleeding.  No history of anemia  Gastrointestinal: No hematochezia or melena,  No gastroesophageal reflux, no trouble swallowing  Urinary: [x ] chronic Kidney disease, [ ]  on HD - [ ]  MWF or [ ]  TTHS, [ ]  Burning with urination, [ ]  Frequent urination, [ ]  Difficulty urinating;   Skin: No rashes  Psychological: No history of anxiety,  No history of depression   Physical Examination  Filed Vitals:   02/14/14 0419 02/14/14 0700 02/14/14 0758 02/14/14 0948  BP: 106/37  134/82 150/59  Pulse: 70  78 69  Temp: 99.3 F (37.4 C)  98.9 F (37.2 C) 99.1 F (37.3 C)  TempSrc: Oral  Oral Oral  Resp: 25  25   Height:      Weight:      SpO2: 100% 100% 100% 100%    Body mass index is 32.36 kg/(m^2).  General:  Alert and oriented, no acute distress HEENT: Normal Abdomen: Soft, non-tender, non-distended, no mass, well healed midline scar, obese Skin: No rash, erythema over right groin with induration extending over the pubic symphysis Extremity Pulses:  2+ femoral,absent popliteal dorsalis pedis, posterior tibial pulses bilaterally Musculoskeletal: No deformity or edema  Neurologic: Upper and lower extremity motor 5/5 and symmetric  DATA:   CBC    Component Value Date/Time   WBC 10.7* 02/14/2014 0410   RBC 3.01* 02/14/2014 0410   HGB 7.8* 02/14/2014 0410  HCT 25.5* 02/14/2014 0410   PLT 189 02/14/2014 0410   MCV 84.7 02/14/2014 0410   MCH 25.9* 02/14/2014 0410   MCHC 30.6 02/14/2014 0410   RDW 13.7 02/14/2014 0410   LYMPHSABS 0.5* 02/11/2014 1429   MONOABS 1.2* 02/11/2014 1429   EOSABS 0.0 02/11/2014 1429   BASOSABS 0.0 02/11/2014 1429     BMET    Component Value Date/Time    NA 133* 02/14/2014 0410   K 4.2 02/14/2014 0410   CL 104 02/14/2014 0410   CO2 23 02/14/2014 0410   GLUCOSE 97 02/14/2014 0410   BUN 23 02/14/2014 0410   CREATININE 1.71* 02/14/2014 0410   CREATININE 1.12 02/10/2014 1206   CALCIUM 7.9* 02/14/2014 0410   GFRNONAA 41* 02/14/2014 0410   GFRNONAA 59* 08/28/2013 1206   GFRAA 47* 02/14/2014 0410   GFRAA 68 08/28/2013 1206   Procedure: local I and D performed at bedside right groin, no obvious abscess cavity entered.  Wound culture obtained.  2 cm incision made and packed with gauze.    ASSESSMENT:  Abscess right groin with possible graft infection.  Recent cardiac event probably secondary to sepsis.  Underlying poor renal function baseline creatinine 1.6 from November   PLAN:  1. CT angio abd pelvis today to investigate extent of graft involvement, hydrate pre Ct to minimize contrast nephropathy risk  2.  Formal I and D of right groin in OR tomorrow.  Pt and wife informed of high risk of limb loss if graft is infected and also the possibility that staged removal of graft may be necessary.  If entire aortobifem graft is infected this could be a life threatening problem.  NPO p midnight Consent Follow up CT and culture results Continue current antibiotics tailor based on culture Heparin to be dc'd today per cardiology rec  Fabienne Bruns, MD [facility] [place] Office: [phone] Pager: [phone]

## 2014-02-15 ENCOUNTER — Inpatient Hospital Stay (HOSPITAL_COMMUNITY): Payer: Medicare HMO | Admitting: Anesthesiology

## 2014-02-15 ENCOUNTER — Encounter (HOSPITAL_COMMUNITY): Payer: Self-pay | Admitting: Certified Registered Nurse Anesthetist

## 2014-02-15 ENCOUNTER — Encounter (HOSPITAL_COMMUNITY)
Admission: EM | Disposition: A | Discharge status: Skilled Nursing Facility | Payer: Self-pay | Source: Home / Self Care | Attending: Internal Medicine

## 2014-02-15 DIAGNOSIS — T827XXA Infection and inflammatory reaction due to other cardiac and vascular devices, implants and grafts, initial encounter: Principal | ICD-10-CM

## 2014-02-15 DIAGNOSIS — T814XXA Infection following a procedure, initial encounter: Secondary | ICD-10-CM

## 2014-02-15 DIAGNOSIS — L02214 Cutaneous abscess of groin: Secondary | ICD-10-CM

## 2014-02-15 HISTORY — PX: WOUND EXPLORATION: SHX6188

## 2014-02-15 LAB — GLUCOSE, CAPILLARY
GLUCOSE-CAPILLARY: 154 mg/dL — AB (ref 70–99)
GLUCOSE-CAPILLARY: 107 mg/dL — AB (ref 70–99)
Glucose-Capillary: 129 mg/dL — ABNORMAL HIGH (ref 70–99)
Glucose-Capillary: 120 mg/dL — ABNORMAL HIGH (ref 70–99)
Glucose-Capillary: 153 mg/dL — ABNORMAL HIGH (ref 70–99)
Glucose-Capillary: 164 mg/dL — ABNORMAL HIGH (ref 70–99)

## 2014-02-15 LAB — MAGNESIUM: MAGNESIUM: 1.8 mg/dL (ref 1.5–2.5)

## 2014-02-15 LAB — CBC
HCT: 24.9 % — ABNORMAL LOW (ref 39.0–52.0)
Hemoglobin: 7.8 g/dL — ABNORMAL LOW (ref 13.0–17.0)
MCH: 27.1 pg (ref 26.0–34.0)
MCHC: 31.3 g/dL (ref 30.0–36.0)
MCV: 86.5 fL (ref 78.0–100.0)
PLATELETS: 179 10*3/uL (ref 150–400)
RBC: 2.88 MIL/uL — ABNORMAL LOW (ref 4.22–5.81)
RDW: 14 % (ref 11.5–15.5)
WBC: 10.1 10*3/uL (ref 4.0–10.5)

## 2014-02-15 LAB — BASIC METABOLIC PANEL
ANION GAP: 7 (ref 5–15)
BUN: 17 mg/dL (ref 6–23)
CHLORIDE: 104 meq/L (ref 96–112)
CO2: 25 mmol/L (ref 19–32)
Calcium: 8.3 mg/dL — ABNORMAL LOW (ref 8.4–10.5)
Creatinine, Ser: 1.3 mg/dL (ref 0.50–1.35)
GFR calc non Af Amer: 56 mL/min — ABNORMAL LOW (ref >90)
GFR, EST AFRICAN AMERICAN: 65 mL/min — AB (ref >90)
GLUCOSE: 115 mg/dL — AB (ref 70–99)
POTASSIUM: 4.2 mmol/L (ref 3.5–5.1)
SODIUM: 136 mmol/L (ref 135–145)

## 2014-02-15 LAB — BLOOD GAS, ARTERIAL
Acid-Base Excess: 2.7 mmol/L — ABNORMAL HIGH (ref 0.0–2.0)
Bicarbonate: 27.4 mEq/L — ABNORMAL HIGH (ref 20.0–24.0)
Drawn by: 331761
O2 Content: 2 L/min
O2 Saturation: 96.9 %
PO2 ART: 93.8 mmHg (ref 80.0–100.0)
Patient temperature: 97.6
TCO2: 28.9 mmol/L (ref 0–100)
pCO2 arterial: 46.3 mmHg — ABNORMAL HIGH (ref 35.0–45.0)
pH, Arterial: 7.387 (ref 7.350–7.450)

## 2014-02-15 LAB — PREPARE RBC (CROSSMATCH)

## 2014-02-15 SURGERY — WOUND EXPLORATION
Anesthesia: General | Site: Groin | Laterality: Right

## 2014-02-15 MED ORDER — NEOSTIGMINE METHYLSULFATE 10 MG/10ML IV SOLN
INTRAVENOUS | Status: AC
  Filled 2014-02-15: qty 1

## 2014-02-15 MED ORDER — SUCCINYLCHOLINE CHLORIDE 20 MG/ML IJ SOLN
INTRAMUSCULAR | Status: AC
  Filled 2014-02-15: qty 1

## 2014-02-15 MED ORDER — EPHEDRINE SULFATE 50 MG/ML IJ SOLN
INTRAMUSCULAR | Status: AC
  Filled 2014-02-15: qty 1

## 2014-02-15 MED ORDER — SODIUM CHLORIDE 0.9 % IV SOLN: Freq: Once | INTRAVENOUS | Status: DC

## 2014-02-15 MED ORDER — ISOSORBIDE MONONITRATE ER 30 MG PO TB24
30.0000 mg | ORAL_TABLET | Freq: Every day | ORAL | Status: DC
  Administered 2014-02-16 – 2014-03-04 (×16): 30 mg via ORAL
  Filled 2014-02-15 (×17): qty 1

## 2014-02-15 MED ORDER — SODIUM CHLORIDE 0.9 % IV SOLN: INTRAVENOUS | Status: DC

## 2014-02-15 MED ORDER — FUROSEMIDE 10 MG/ML IJ SOLN
60.0000 mg | Freq: Once | INTRAMUSCULAR | Status: AC
  Administered 2014-02-15: 60 mg via INTRAVENOUS
  Filled 2014-02-15: qty 6

## 2014-02-15 MED ORDER — ONDANSETRON HCL 4 MG/2ML IJ SOLN
INTRAMUSCULAR | Status: AC
  Filled 2014-02-15: qty 2

## 2014-02-15 MED ORDER — LIDOCAINE HCL (CARDIAC) 20 MG/ML IV SOLN
INTRAVENOUS | Status: AC
  Filled 2014-02-15: qty 5

## 2014-02-15 MED ORDER — PROPOFOL 10 MG/ML IV BOLUS
INTRAVENOUS | Status: DC | PRN
  Administered 2014-02-15: 150 mg via INTRAVENOUS

## 2014-02-15 MED ORDER — FENTANYL CITRATE 0.05 MG/ML IJ SOLN: 25.0000 ug | INTRAMUSCULAR | Status: DC | PRN

## 2014-02-15 MED ORDER — PROPOFOL 10 MG/ML IV BOLUS
INTRAVENOUS | Status: AC
  Filled 2014-02-15: qty 20

## 2014-02-15 MED ORDER — FENTANYL CITRATE 0.05 MG/ML IJ SOLN
INTRAMUSCULAR | Status: AC
  Filled 2014-02-15: qty 5

## 2014-02-15 MED ORDER — ARTIFICIAL TEARS OP OINT
TOPICAL_OINTMENT | OPHTHALMIC | Status: AC
  Filled 2014-02-15: qty 3.5

## 2014-02-15 MED ORDER — 0.9 % SODIUM CHLORIDE (POUR BTL) OPTIME
TOPICAL | Status: DC | PRN
Start: 2014-02-15 — End: 2014-02-15
  Administered 2014-02-15: 2000 mL

## 2014-02-15 MED ORDER — PROMETHAZINE HCL 25 MG/ML IJ SOLN: 6.2500 mg | INTRAMUSCULAR | Status: DC | PRN

## 2014-02-15 MED ORDER — LIDOCAINE HCL 1 % IJ SOLN
INTRAMUSCULAR | Status: DC | PRN
  Administered 2014-02-15: 80 mg via INTRADERMAL
  Administered 2014-02-15: 20 mg via INTRADERMAL

## 2014-02-15 MED ORDER — LACTATED RINGERS IV SOLN
INTRAVENOUS | Status: DC | PRN
  Administered 2014-02-15: 09:00:00 via INTRAVENOUS

## 2014-02-15 MED ORDER — MIDAZOLAM HCL 2 MG/2ML IJ SOLN
INTRAMUSCULAR | Status: AC
  Filled 2014-02-15: qty 2

## 2014-02-15 MED ORDER — SODIUM CHLORIDE 0.9 % IJ SOLN
INTRAMUSCULAR | Status: AC
  Filled 2014-02-15: qty 10

## 2014-02-15 MED ORDER — ARTIFICIAL TEARS OP OINT
TOPICAL_OINTMENT | OPHTHALMIC | Status: DC | PRN
  Administered 2014-02-15: 1 via OPHTHALMIC

## 2014-02-15 MED ORDER — FUROSEMIDE 10 MG/ML IJ SOLN
40.0000 mg | Freq: Once | INTRAMUSCULAR | Status: AC
  Administered 2014-02-15: 40 mg via INTRAVENOUS
  Filled 2014-02-15: qty 4

## 2014-02-15 MED ORDER — SUCCINYLCHOLINE CHLORIDE 20 MG/ML IJ SOLN
INTRAMUSCULAR | Status: DC | PRN
  Administered 2014-02-15: 100 mg via INTRAVENOUS

## 2014-02-15 MED ORDER — ONDANSETRON HCL 4 MG/2ML IJ SOLN
INTRAMUSCULAR | Status: DC | PRN
  Administered 2014-02-15: 4 mg via INTRAVENOUS

## 2014-02-15 MED ORDER — GLYCOPYRROLATE 0.2 MG/ML IJ SOLN
INTRAMUSCULAR | Status: AC
  Filled 2014-02-15: qty 3

## 2014-02-15 MED ORDER — ROCURONIUM BROMIDE 50 MG/5ML IV SOLN
INTRAVENOUS | Status: AC
  Filled 2014-02-15: qty 1

## 2014-02-15 MED ORDER — FENTANYL CITRATE 0.05 MG/ML IJ SOLN
INTRAMUSCULAR | Status: DC | PRN
  Administered 2014-02-15: 50 ug via INTRAVENOUS

## 2014-02-15 SURGICAL SUPPLY — 29 items
BNDG GAUZE ELAST 4 BULKY (GAUZE/BANDAGES/DRESSINGS) ×1 IMPLANT
CANISTER SUCTION 2500CC (MISCELLANEOUS) ×2 IMPLANT
COVER SURGICAL LIGHT HANDLE (MISCELLANEOUS) ×2 IMPLANT
ELECT REM PT RETURN 9FT ADLT (ELECTROSURGICAL) ×2
ELECTRODE REM PT RTRN 9FT ADLT (ELECTROSURGICAL) ×1 IMPLANT
GAUZE SPONGE 4X4 12PLY STRL (GAUZE/BANDAGES/DRESSINGS) ×1 IMPLANT
GLOVE BIO SURGEON STRL SZ7.5 (GLOVE) ×1 IMPLANT
GLOVE BIOGEL PI IND STRL 6.5 (GLOVE) IMPLANT
GLOVE BIOGEL PI IND STRL 7.0 (GLOVE) IMPLANT
GLOVE BIOGEL PI INDICATOR 6.5 (GLOVE) ×2
GLOVE BIOGEL PI INDICATOR 7.0 (GLOVE) ×1
GLOVE SURG SS PI 7.5 STRL IVOR (GLOVE) ×1 IMPLANT
GOWN STRL REUS W/ TWL LRG LVL3 (GOWN DISPOSABLE) ×3 IMPLANT
GOWN STRL REUS W/TWL LRG LVL3 (GOWN DISPOSABLE) ×6
KIT BASIN OR (CUSTOM PROCEDURE TRAY) ×2 IMPLANT
KIT ROOM TURNOVER OR (KITS) ×2 IMPLANT
NS IRRIG 1000ML POUR BTL (IV SOLUTION) ×2 IMPLANT
PACK GENERAL/GYN (CUSTOM PROCEDURE TRAY) ×2 IMPLANT
PACK UNIVERSAL I (CUSTOM PROCEDURE TRAY) ×2 IMPLANT
PAD ARMBOARD 7.5X6 YLW CONV (MISCELLANEOUS) ×4 IMPLANT
SPONGE GAUZE 4X4 12PLY STER LF (GAUZE/BANDAGES/DRESSINGS) ×1 IMPLANT
STAPLER VISISTAT 35W (STAPLE) IMPLANT
SUT ETHILON 3 0 PS 1 (SUTURE) IMPLANT
SUT VIC AB 2-0 CTX 36 (SUTURE) IMPLANT
SUT VIC AB 3-0 SH 27 (SUTURE)
SUT VIC AB 3-0 SH 27X BRD (SUTURE) IMPLANT
SUT VICRYL 4-0 PS2 18IN ABS (SUTURE) IMPLANT
TAPE CLOTH SURG 4X10 WHT LF (GAUZE/BANDAGES/DRESSINGS) ×1 IMPLANT
WATER STERILE IRR 1000ML POUR (IV SOLUTION) ×2 IMPLANT

## 2014-02-15 NOTE — Op Note (Signed)
Procedure: Incision and drainage right groin  Preoperative diagnosis: Abscess right groin the  Postoperative diagnosis: Same  Anesthesia: Gen.  Assistant: Nurse  Indications: Patient is a 64 year old male who had an aortobifemoral bypass done several years ago. The right limb of the aortobifem was revised approximately 6 weeks ago. The patient presented with erythema in the right groin and fever. Ultrasound and CT scan showed a complex loculated fluid collection in the right groin.  Operative findings: Large abscess cavity right groin yellowish liquid adjacent to the graft which is now fully exposed  Specimens: Culture right groin  Operative details: After obtaining informed consent, the patient was taken to the operative room. The patient was placed in supine position operating table. After induction of general anesthesia and tracheal intubation, the patient's entire right groin was prepped and draped in usual sterile fashion. Next a longitudinal incision was made her pre-existing scar in the right groin. This was carried through the subcutaneous tissues and down into an abscess cavity that was under pressure. There was a large amount of yellowish-colored fluid which was evacuated. A proximally 250-300 cc. Cultures were taken of the abscess cavity.  After all of this had been thoroughly evacuated the right limb of the graft could be seen at the base of the incision. It was patent and pulsatile. There was no bleeding from the suture line. The wound was thoroughly irrigated with normal saline solution. The wound was then packed with saline moistened Kerlix. The patient tolerated the procedure well and there were no complications. The antrum and sponge and needle counts correct in the case. The patient was extubated in the operating room and taken to the recovery room in stable condition.  Fabienne Bruns, MD [facility] [place] Office: [phone] Pager: [phone]

## 2014-02-15 NOTE — Progress Notes (Signed)
Pt now with exposed graft right groin.  Will most likely need removal of some or all of graft next week.  Wound currently packed with saline moistened Kerlix.  Groin dressing to be changed by MD only as high risk of bleeding.  Will transfer to [facility].  Fabienne Bruns, MD [facility] [place] Office: [phone] Pager: [phone]

## 2014-02-15 NOTE — Anesthesia Procedure Notes (Addendum)
Procedure Name: Intubation Date/Time: 02/15/2014 9:02 AM Performed by: Angelica Pou Pre-anesthesia Checklist: Patient identified, Timeout performed, Emergency Drugs available, Suction available and Patient being monitored Patient Re-evaluated:Patient Re-evaluated prior to inductionOxygen Delivery Method: Circle system utilized Preoxygenation: Pre-oxygenation with 100% oxygen Intubation Type: IV induction Ventilation: Mask ventilation without difficulty and Oral airway inserted - appropriate to patient size Laryngoscope Size: Mac and 4 (LMA # 5 with suboptimal seal, removed and intubated with Mac4 wiithout difficulty) Grade View: Grade I Tube type: Oral Tube size: 7.5 mm Number of attempts: 1 Airway Equipment and Method: Oral airway Placement Confirmation: ETT inserted through vocal cords under direct vision,  breath sounds checked- equal and bilateral and positive ETCO2 Secured at: 23 cm Tube secured with: Tape Dental Injury: Teeth and Oropharynx as per pre-operative assessment

## 2014-02-15 NOTE — Anesthesia Preprocedure Evaluation (Addendum)
Anesthesia Evaluation  Patient identified by MRN, date of birth, ID band Patient awake    Reviewed: Allergy & Precautions, H&P , NPO status , Patient's Chart, lab work & pertinent test results  Airway Mallampati: II  TM Distance: >3 FB     Dental  (+) Edentulous Upper, Edentulous Lower, Dental Advisory Given   Pulmonary sleep apnea , COPDformer smoker,    + decreased breath sounds      Cardiovascular hypertension, Pt. on medications and Pt. on home beta blockers + Peripheral Vascular Disease + pacemaker Rhythm:Regular Rate:Normal     Neuro/Psych    GI/Hepatic GERD-  ,  Endo/Other  diabetes, Insulin Dependent  Renal/GU Renal InsufficiencyRenal disease     Musculoskeletal   Abdominal   Peds  Hematology  (+) anemia ,   Anesthesia Other Findings   Reproductive/Obstetrics                           Anesthesia Physical Anesthesia Plan  ASA: IV  Anesthesia Plan: General   Post-op Pain Management:    Induction: Intravenous  Airway Management Planned: LMA  Additional Equipment:   Intra-op Plan:   Post-operative Plan: Extubation in OR  Informed Consent: I have reviewed the patients History and Physical, chart, labs and discussed the procedure including the risks, benefits and alternatives for the proposed anesthesia with the patient or authorized representative who has indicated his/her understanding and acceptance.   Dental advisory given  Plan Discussed with: CRNA and Surgeon  Anesthesia Plan Comments:        Anesthesia Quick Evaluation

## 2014-02-15 NOTE — Progress Notes (Signed)
Notified Dr. David Stall of pt having 12 beats of Vtach. Awaiting call back or new orders. Will continue to monitor pt.

## 2014-02-15 NOTE — Interval H&P Note (Signed)
History and Physical Interval Note:  02/15/2014 7:36 AM  [name]  has presented today for surgery, with the diagnosis of DRAINAGE OF RIGHT GROIN  The various methods of treatment have been discussed with the patient and family. After consideration of risks, benefits and other options for treatment, the patient has consented to  Procedure(s): I AND D RIGHT GROIN (Right) as a surgical intervention .  The patient's history has been reviewed, patient examined, no change in status, stable for surgery.  I have reviewed the patient's chart and labs.  Questions were answered to the patient's satisfaction.     FIELDS,CHARLES E

## 2014-02-15 NOTE — Transfer of Care (Signed)
Immediate Anesthesia Transfer of Care Note  Patient: [name]  Procedure(s) Performed: Procedure(s): Incision and Drainage Right Groin (Right)  Patient Location: PACU  Anesthesia Type:General  Level of Consciousness: awake, patient cooperative and lethargic  Airway & Oxygen Therapy: Patient Spontanous Breathing and Patient connected to face mask oxygen  Post-op Assessment: Report given to PACU RN, Post -op Vital signs reviewed and stable and Patient moving all extremities  Post vital signs: Reviewed and stable  Complications: No apparent anesthesia complications

## 2014-02-15 NOTE — H&P (View-Only) (Signed)
[facility] [place] HISTORY AND PHYSICAL   History of Present Illness:  Patient is a 64 y.o. year old male who presents for evaluation of possible right groin abscess.  Pt had aortobifem by Dr Hayes 2007.  Recently had occlusion of the left limb and this was revised by Dr Dickson mid November of this year.  Had revision of the right limb at the same setting due to high grade stenosis. He also has had prior leg bypasses which are known to be chronically occluded.  Was admitted a few days ago with fever and dyspnea and thought to have coronary event vs sepsis/pneumonia.  Had troponin elevation to 8.  Pt transferred out of CCU today overall stable.  He has been on cefipime and vancomycin.  He is also on heparin.  He has has some intermittent groin drainage and was noted on ultrasound yesterday to have 10 cm right groin fluid collection.  Pt states both groins have been sore right > left.  Other medical problems include CAD prior CABG/pacemaker, DM, hyperlipid, prior CEA, neuropathy, COPD, hypertension, arthritis, obesity, sleep apnea, history of tobacco abuse.  All of these are currently stable.  Past Medical History  Diagnosis Date  . Arteriosclerotic cardiovascular disease (ASCVD)     CABG in 2003 for 3 VD; 07/2007: Stress nuclear-inferior infarction without ischemia; normal EF by echo in 06/2005  . Diabetes mellitus type II   . Hyperlipidemia   . Degenerative joint disease     Left knee  . Cerebrovascular disease 2003    Carotid ultrasound in 2010: Patent right CEA site, 60-79% left internal carotid artery  . Peripheral vascular disease 2007    aortobifemoral BPG-2007  . Anemia   . Peripheral neuropathy   . Hypertension   . Sick sinus syndrome 1996    permanent pacemaker-1996; generator change in 2010  . Chronic obstructive pulmonary disease   . Obesity   . Obstructive sleep apnea   . Benign prostatic hypertrophy   . Gastroesophageal reflux disease   . Tobacco abuse, in  remission     50 pack years total consumption; Quit in 2007    Past Surgical History  Procedure Laterality Date  . Coronary artery bypass graft  2003    three-vessel disease  . Iliac artery stent      Percutaneous intervention-right external iliac; unclear whether stent was placed  . Pacemaker insertion  1996    generator change in 2010  . Carotid endarterectomy  2003    Right  . Pacemaker insertion      Generator replaced in 04/2008  . Aorta - bilateral femoral artery bypass graft  2007  . Femoral-popliteal bypass graft  1998    bilateral  . Colonoscopy  2012    Negative screening study.  . Ileocolonoscopy  02/20/2007    RMR:Normal rectum, colon, and terminal ileum  . Femoral-femoral bypass graft Bilateral 01/03/2014    Procedure: BYPASS GRAFT RIGHT FEMORAL-LEFT FEMORAL ARTERY ;  Surgeon: Christopher S Dickson, MD;  Location: [facility] OR;  Service: Vascular;  Laterality: Bilateral;  . Endarterectomy femoral Left 01/03/2014    Procedure: ENDARTERECTOMY FEMORAL;  Surgeon: Christopher S Dickson, MD;  Location: [facility] OR;  Service: Vascular;  Laterality: Left;  . Intraoperative arteriogram Left 01/03/2014    Procedure: INTRA OPERATIVE ARTERIOGRAM, LEFT LOWER LEG;  Surgeon: Christopher S Dickson, MD;  Location: [facility] OR;  Service: Vascular;  Laterality: Left;  . Abdominal angiogram N/A 01/02/2014    Procedure: ABDOMINAL ANGIOGRAM;  Surgeon:   Brian L Chen, MD;  Location: [facility] CATH LAB;  Service: Cardiovascular;  Laterality: N/A;  . Peripheral vascular catheterization  01/02/2014    Procedure: LOWER EXTREMITY ANGIOGRAPHY;  Surgeon: Brian L Chen, MD;  Location: [facility] CATH [facility];  Service: Cardiovascular;;  . Arch aortogram  01/02/2014    Procedure: ARCH AORTOGRAM;  Surgeon: Brian L Chen, MD;  Location: [facility] CATH [facility];  Service: Cardiovascular;;    Social History History  Substance Use Topics  . Smoking status: Former Smoker -- 2.00 packs/day for 25 years    Types: Cigarettes    Quit date: 01/22/2005  .  Smokeless tobacco: Former User    Quit date: 02/16/1996  . Alcohol Use: No    Family History Family History  Problem Relation Age of Onset  . Dementia Mother   . Heart attack Father   . Coronary artery disease Father   . Peripheral vascular disease Sister   . Diabetes Brother   . Peripheral vascular disease Brother   . Hypertension Brother   . Coronary artery disease Brother     Allergies  Allergies  Allergen Reactions  . Morphine And Related     Pt felt flushed and was diaphoretic      Current Facility-Administered Medications  Medication Dose Route Frequency Provider Last Rate Last Dose  . 0.9 %  sodium chloride infusion   Intravenous Continuous Abraham Feliz Ortiz, MD 10 mL/hr at 02/14/14 0900    . 0.9 %  sodium chloride infusion   Intravenous Once Charles E Fields, MD      . acetaminophen (TYLENOL) tablet 1,000 mg  1,000 mg Oral Q6H PRN Nimish C Gosrani, MD   1,000 mg at 02/14/14 0025  . aspirin chewable tablet 81 mg  81 mg Oral Daily Nimish C Gosrani, MD   81 mg at 02/14/14 0903  . atorvastatin (LIPITOR) tablet 80 mg  80 mg Oral q1800 Jacob Kelly, MD   80 mg at 02/13/14 1700  . ceFEPIme (MAXIPIME) 1 g in dextrose 5 % 50 mL IVPB  1 g Intravenous Q24H Jennifer Sue Markle, RPH   1 g at 02/14/14 0636  . cloNIDine (CATAPRES - Dosed in mg/24 hr) patch 0.1 mg  0.1 mg Transdermal Weekly Nimish C Gosrani, MD   0.1 mg at 02/12/14 1204  . ferrous sulfate tablet 325 mg  325 mg Oral BID Nimish C Gosrani, MD   325 mg at 02/14/14 0902  . gabapentin (NEURONTIN) capsule 300 mg  300 mg Oral TID Nimish C Gosrani, MD   300 mg at 02/14/14 0903  . insulin aspart (novoLOG) injection 0-20 Units  0-20 Units Subcutaneous TID WC Nimish C Gosrani, MD   4 Units at 02/13/14 1659  . insulin aspart (novoLOG) injection 0-5 Units  0-5 Units Subcutaneous QHS Nimish C Gosrani, MD   2 Units at 02/12/14 2335  . insulin detemir (LEVEMIR) injection 50 Units  50 Units Subcutaneous Q2200 Nimish C Gosrani, MD   50  Units at 02/13/14 2247  . ipratropium-albuterol (DUONEB) 0.5-2.5 (3) MG/3ML nebulizer solution 3 mL  3 mL Nebulization Q4H PRN Nimish C Gosrani, MD   3 mL at 02/14/14 0533  . isosorbide mononitrate (IMDUR) 24 hr tablet 15 mg  15 mg Oral Daily Abraham Feliz Ortiz, MD   15 mg at 02/14/14 0903  . metoprolol tartrate (LOPRESSOR) tablet 25 mg  25 mg Oral BID Nimish C Gosrani, MD   25 mg at 02/14/14 0903  . ondansetron (ZOFRAN) tablet 4 mg  4   mg Oral Q6H PRN Nimish C Gosrani, MD   4 mg at 02/12/14 1151   Or  . ondansetron (ZOFRAN) injection 4 mg  4 mg Intravenous Q6H PRN Nimish C Gosrani, MD      . oseltamivir (TAMIFLU) capsule 30 mg  30 mg Oral BID Abraham Feliz Ortiz, MD   30 mg at 02/14/14 0903  . sodium chloride 0.9 % injection 10-40 mL  10-40 mL Intracatheter Q12H Abraham Feliz Ortiz, MD   10 mL at 02/13/14 0931  . sodium chloride 0.9 % injection 10-40 mL  10-40 mL Intracatheter PRN Abraham Feliz Ortiz, MD      . sodium chloride 0.9 % injection 3 mL  3 mL Intravenous Q12H Nimish C Gosrani, MD   3 mL at 02/14/14 1000  . traMADol (ULTRAM) tablet 50 mg  50 mg Oral Q6H PRN Nimish C Gosrani, MD   50 mg at 02/11/14 2256  . triamcinolone cream (KENALOG) 0.1 % 1 application  1 application Topical BID Nimish C Gosrani, MD   1 application at 02/13/14 2215  . vancomycin (VANCOCIN) IVPB 750 mg/150 ml premix  750 mg Intravenous Q12H Nimish C Gosrani, MD   750 mg at 02/14/14 0422    ROS:   General:  No weight loss, +Fever, chills  HEENT: No recent headaches, no nasal bleeding, no visual changes, no sore throat  Neurologic: No dizziness, blackouts, seizures. No recent symptoms of stroke or mini- stroke. No recent episodes of slurred speech, or temporary blindness.  Cardiac: No recent episodes of chest pain/pressure, + shortness of breath at rest.  + shortness of breath with exertion.  Denies history of atrial fibrillation or irregular heartbeat  Vascular: No history of rest pain in feet.  No history of  claudication.  No history of non-healing ulcer, No history of DVT   Pulmonary: No home oxygen, no productive cough, no hemoptysis,  No asthma or wheezing  Musculoskeletal:  [x ] Arthritis, [ ] Low back pain,  [x ] Joint pain  Hematologic:No history of hypercoagulable state.  No history of easy bleeding.  No history of anemia  Gastrointestinal: No hematochezia or melena,  No gastroesophageal reflux, no trouble swallowing  Urinary: [x ] chronic Kidney disease, [ ] on HD - [ ] MWF or [ ] TTHS, [ ] Burning with urination, [ ] Frequent urination, [ ] Difficulty urinating;   Skin: No rashes  Psychological: No history of anxiety,  No history of depression   Physical Examination  Filed Vitals:   02/14/14 0419 02/14/14 0700 02/14/14 0758 02/14/14 0948  BP: 106/37  134/82 150/59  Pulse: 70  78 69  Temp: 99.3 F (37.4 C)  98.9 F (37.2 C) 99.1 F (37.3 C)  TempSrc: Oral  Oral Oral  Resp: 25  25   Height:      Weight:      SpO2: 100% 100% 100% 100%    Body mass index is 32.36 kg/(m^2).  General:  Alert and oriented, no acute distress HEENT: Normal Abdomen: Soft, non-tender, non-distended, no mass, well healed midline scar, obese Skin: No rash, erythema over right groin with induration extending over the pubic symphysis Extremity Pulses:  2+ femoral,absent popliteal dorsalis pedis, posterior tibial pulses bilaterally Musculoskeletal: No deformity or edema  Neurologic: Upper and lower extremity motor 5/5 and symmetric  DATA:   CBC    Component Value Date/Time   WBC 10.7* 02/14/2014 0410   RBC 3.01* 02/14/2014 0410   HGB 7.8* 02/14/2014 0410     HCT 25.5* 02/14/2014 0410   PLT 189 02/14/2014 0410   MCV 84.7 02/14/2014 0410   MCH 25.9* 02/14/2014 0410   MCHC 30.6 02/14/2014 0410   RDW 13.7 02/14/2014 0410   LYMPHSABS 0.5* 02/11/2014 1429   MONOABS 1.2* 02/11/2014 1429   EOSABS 0.0 02/11/2014 1429   BASOSABS 0.0 02/11/2014 1429     BMET    Component Value Date/Time    NA 133* 02/14/2014 0410   K 4.2 02/14/2014 0410   CL 104 02/14/2014 0410   CO2 23 02/14/2014 0410   GLUCOSE 97 02/14/2014 0410   BUN 23 02/14/2014 0410   CREATININE 1.71* 02/14/2014 0410   CREATININE 1.12 02/10/2014 1206   CALCIUM 7.9* 02/14/2014 0410   GFRNONAA 41* 02/14/2014 0410   GFRNONAA 59* 08/28/2013 1206   GFRAA 47* 02/14/2014 0410   GFRAA 68 08/28/2013 1206   Procedure: local I and D performed at bedside right groin, no obvious abscess cavity entered.  Wound culture obtained.  2 cm incision made and packed with gauze.    ASSESSMENT:  Abscess right groin with possible graft infection.  Recent cardiac event probably secondary to sepsis.  Underlying poor renal function baseline creatinine 1.6 from November   PLAN:  1. CT angio abd pelvis today to investigate extent of graft involvement, hydrate pre Ct to minimize contrast nephropathy risk  2.  Formal I and D of right groin in OR tomorrow.  Pt and wife informed of high risk of limb loss if graft is infected and also the possibility that staged removal of graft may be necessary.  If entire aortobifem graft is infected this could be a life threatening problem.  NPO p midnight Consent Follow up CT and culture results Continue current antibiotics tailor based on culture Heparin to be dc'd today per cardiology rec  Charles Fields, MD [facility] [place] Office: [phone] Pager: [phone]   

## 2014-02-15 NOTE — Progress Notes (Signed)
[facility] PROGRESS NOTE Interim history: 64 year old male with past medical history a recent aorta femoral bypass, who came in with acute hypoxic respiratory failure, sepsis and elevated cardiac enzymes. Started on empiric antibiotics. Groin ultrasound showed abscess, vascular surgery consulted recommended a CT imaging of the pelvis that showed multiple loculated abscesses status post I&D and possible graft infection on 02/15/2014. Blood cultures continue to be negative, abscess cultures are pending. He'll need an ischemic evaluation as an outpatient. He also developed acute kidney injury probably due to sepsis, ACE inhibitor and contrast-induced nephropathy which resolved. A CT scan of the abdomen and pelvis with contrast had to be repeated emergently on 02/14/2014. He is on IV fluid to decrease the risk of contrast-induced nephropathy. Continue to monitor creatinine. Weight continues to be stable.  Filed Weights   02/11/14 1328 02/11/14 2059 02/12/14 0535  Weight: 89.812 kg (198 lb) 90 kg (198 lb 6.6 oz) 88.2 kg (194 lb 7.1 oz)        Intake/Output Summary (Last 24 hours) at 02/15/14 1329 Last data filed at 02/15/14 1257  Gross per 24 hour  Intake    510 ml  Output   1375 ml  Net   -865 ml     Assessment/Plan: Acute respiratory failure with hypoxia - Unclear etiology, BNP was elevated, with a significant elevation in WBC and fever. Likely due to sepsis of the groin and demand ischemia. - Currently satting 100% on 2 L.  - Now resolved.  Sepsis? Due groin abscess: - Ct angio of graft showed multiple abscess, Appreciate vascular surgery Assistance. They recommended emergent CT scan of the pelvis with IV contrast. - Empiric vancomycin and cefepime started on 12.30.2015 - Blood cultures negative till date. - S/p I&D of groin abscess, cultures pending.  AKI (acute kidney injury)/acute toxic nephropathy: - Multifactorial due to sepsis, ACE inhibitor and contrast-induced  nephropathy. - cont hydration, check b-met to monitor renal function. - check CVP.  Elevated Troponin's: - Cont aspirin, statin and beta blockers. - Cardiology on board,recommended an ischemic as an outpatient - Echo as below  Hyponatremia - Resolved with hydration.  Chronic obstructive pulmonary disease - Stable.  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  Essential hypertension - Increase Imdur. - Blood pressure mildly high.   Normocytic anemia: - Ferritin will be unreliable transfuse 2 units of packed red blood cells. - [place]. Hemoglobin closer to 9.  Code Status: Full code  DVT Prophylaxis: Heparin Family Communication: I discussed the plan with the patient at the bedside.  Disposition Plan: Home when medically stable.    Consultants:  Cardiology  Procedures: ECHO: EF normal no wall motion. Pelvic US 12.30.2015: Large 11.4 x 10.0 x 4.6 cm right groin complex fluid collection noted  Antibiotics:  Vanco and cefepime 02/11/2014  HPI/Subjective: He's complaining of groin pain, he is also hungry.  Objective: Filed Vitals:   02/15/14 1115 02/15/14 1145 02/15/14 1215 02/15/14 1225  BP: 174/75 171/77 158/66 165/77  Pulse: 75 71 72 71  Temp:      TempSrc:      Resp: _0 Height:      Weight:      SpO2: 99% 100% 98% 99%     Exam:  General: Alert, awake, oriented x3, in no acute distress.  HEENT: No bruits, no goiter. Negative JVD Heart: Regular rate and rhythm. Lungs: Good air movement, crackles on  the right Abdomen: Soft, nontender, nondistended, positive bowel sounds.  Skin: Bilateral indurated of groins but no fluctuating masses on the groin with some superficial dilatation of the skin without any purulent drainage no warm to touch or erythematous.   Data Reviewed: Basic Metabolic Panel:  Recent Labs Lab 02/10/14 1206  02/11/14 1429 02/12/14 0155 02/13/14 0758 02/14/14 0410  NA 139 137 132* 134* 133*  K 4.3 4.1 3.8 4.2 4.2  CL 102 102 102 104 104  CO2 _0 GLUCOSE 234* 235* 188* 131* 97  BUN _1 24* 23  CREATININE 1.12 1.36* 1.67* 1.89* 1.71*  CALCIUM 9.6 9.6 8.4 8.0* 7.9*   Liver Function Tests:  Recent Labs Lab 02/10/14 1206 02/11/14 1429 02/12/14 0155  AST 14 15 34  ALT _2 ALKPHOS 63 61 50  BILITOT 0.7 1.1 1.2  PROT 6.9 7.7 6.2  ALBUMIN 3.6 3.6 2.9*   No results for input(s): LIPASE, AMYLASE in the last 168 hours. No results for input(s): AMMONIA in the last 168 hours. CBC:  Recent Labs Lab 02/10/14 1206 02/11/14 1429 02/12/14 0155 02/13/14 0720 02/14/14 0410 02/15/14 0444  WBC 5.4 17.0* 17.5* 15.4* 10.7* 10.1  NEUTROABS 3.0 15.3*  --   --   --   --   HGB 10.7* 10.9* 9.6* 8.9* 7.8* 7.8*  HCT 34.0* 35.0* 31.0* 28.6* 25.5* 24.9*  MCV 84.6 87.3 86.4 85.4 84.7 86.5  PLT 297 293 248 207 189 179   Cardiac Enzymes:  Recent Labs Lab 02/12/14 0155 02/12/14 1045 02/12/14 1532 02/13/14 0005  TROPONINI 8.01* 3.07* 2.31* 1.32*   BNP (last 3 results) No results for input(s): PROBNP in the last 8760 hours. CBG:  Recent Labs Lab 02/14/14 1617 02/14/14 2114 02/15/14 0611 02/15/14 0820 02/15/14 1031  GLUCAP 136* 175* 154* 129* 107*    Recent Results (from the past 240 hour(s))  Blood culture (routine x 2)     Status: None (Preliminary result)   Collection Time: 02/11/14  2:29 PM  Result Value Ref Range Status   Specimen Description BLOOD RIGHT HAND  Final   Special Requests BOTTLES DRAWN AEROBIC AND ANAEROBIC 4CC  Final   Culture NO GROWTH 4 DAYS  Final   Report Status PENDING  Incomplete  Blood culture (routine x 2)     Status: None (Preliminary result)   Collection Time: 02/11/14  2:33 PM  Result Value Ref Range Status   Specimen Description BLOOD RIGHT ANTECUBITAL  Final   Special Requests BOTTLES DRAWN AEROBIC ONLY [place]  Final   Culture  NO GROWTH 4 DAYS  Final   Report Status PENDING  Incomplete  Urine culture     Status: None   Collection Time: 02/11/14  2:56 PM  Result Value Ref Range Status   Specimen Description URINE, CLEAN CATCH  Final   Special Requests NONE  Final   Colony Count NO GROWTH Performed at [facility]   Final   Culture NO GROWTH Performed at [facility]   Final   Report Status 02/12/2014 FINAL  Final  MRSA PCR Screening     Status: None   Collection Time: 02/11/14  6:40 PM  Result Value Ref Range Status   MRSA by PCR NEGATIVE NEGATIVE Final    Comment:        The GeneXpert MRSA Assay (FDA approved for NASAL specimens only), is one component of a comprehensive MRSA colonization surveillance program. It is not intended to  diagnose MRSA infection nor to guide or monitor treatment for MRSA infections.      Studies: Ct Angio Abd/pel W/ And/or W/o  02/14/2014   CLINICAL DATA:  Complex fluid collection of the right groin and status post prior aortobifemoral bypass grafting in 2007 with prior revision of the mid pole right graft limb  EXAM: CTA ABDOMEN AND PELVIS WITHOUT CONTRAST  TECHNIQUE: Multidetector CT imaging of the abdomen and pelvis was performed using the standard protocol during bolus administration of intravenous contrast. Multiplanar reconstructed images and MIPs were obtained and reviewed to evaluate the vascular anatomy.  CONTRAST:  26m OMNIPAQUE IOHEXOL 350 MG/ML SOLN  COMPARISON:  Right groin ultrasound on 02/12/2014  FINDINGS: Complex fluid collection of the right inguinal region present with 3 separate components that likely communicate with 1 another. Superior component is associated with the inferior abdominal wall and measures approximately 5.5 cm in diameter. Surrounding inflammation present. The deep portion of this fluid collection abuts the distal right limb of an aortobifemoral graft without evidence of graft pseudoaneurysm or erosion. No air is identified  adjacent to the graft or in the fluid collection  Communicating second pocket of fluid extends towards the lower inguinal region and measures approximately 5 cm in diameter. There is a soft tissue wound just superficial to the second component of loculated fluid. The wound itself does not appear to extend all the way to the level of fluid. Third communicating pocket extends into the right inguinal canal and measures approximately 6 cm in greatest diameter. In the coronal projection, all of the fluid collections at up to roughly 12 cm in maximal diameter. Findings are likely consistent with multi loculated abscess given constellation of CT and ultrasound findings.  The aortobifemoral graft itself shows normal patency. Both native SFA origins appear to likely be occluded chronically. Native abdominal aorta above the graft shows no evidence of aneurysmal disease or significant stenosis. There is approximately 80% narrowing at the origin of the celiac axis. The superior mesenteric artery trunk shows diffuse and significant atherosclerosis. Bilateral renal arteries are heavily calcified proximally.  The liver shows steatosis. There is reflux of contrast into the intrahepatic IVC and hepatic veins consistent with a component of right heart failure. The visualized lung bases show small bilateral pleural effusions.  Rounded hyperdense cortical lesion of the lateral mid left kidney measures approximately 13 mm in diameter. This represents either a hemorrhagic cyst or enhancing cortical lesion. No abnormalities are seen involving bowel. The bladder appears unremarkable. No bony abnormalities.  Review of the MIP images confirms the above findings.  IMPRESSION: 1. Complex and multi lobulated fluid collection in the right inguinal region with 3 separate components present. These likely communicate with one another and span over a distance of roughly 12 cm. Part of the fluid collection does abut the distal right limb of the  aortobifemoral graft. No evidence of gas in the fluid or graft pseudoaneurysm. This most likely represents a complex abscess. Inferior component of fluid extends into the right inguinal canal. 2. Probable underlying right heart failure with small bilateral pleural effusions. 3. 13 mm hyperdense cortical lesion of the mid lateral left renal cortex. This is incompletely evaluated on the CTA study. This could represent a hyperdense/hemorrhagic cyst or enhancing cortical neoplasm. Followup is recommended. The most definitive evaluation would be MRI of the abdomen when the patient is able to tolerate MRI examination.   Electronically Signed   By: GAletta EdouardM.D.   On: 02/14/2014 15:41  Scheduled Meds: . aspirin  81 mg Oral Daily  . atorvastatin  80 mg Oral q1800  . ceFEPime (MAXIPIME) IV  1 g Intravenous Q24H  . cloNIDine  0.1 mg Transdermal Weekly  . ferrous sulfate  325 mg Oral BID  . gabapentin  300 mg Oral TID  . insulin aspart  0-20 Units Subcutaneous TID WC  . insulin aspart  0-5 Units Subcutaneous QHS  . insulin detemir  50 Units Subcutaneous Q2200  . isosorbide mononitrate  15 mg Oral Daily  . metoprolol  25 mg Oral BID  . oseltamivir  30 mg Oral BID  . sodium chloride  10-40 mL Intracatheter Q12H  . sodium chloride  3 mL Intravenous Q12H  . triamcinolone cream  1 application Topical BID  . vancomycin  750 mg Intravenous Q12H   Continuous Infusions: . sodium chloride 10 mL/hr at 02/14/14 0900     FELIZ Marguarite Arbour  [facility] Pager [phone] If 8PM-8AM, please contact night-coverage at [email or web address], password [facility] 02/15/2014, 1:29 PM  LOS: 4 days

## 2014-02-15 NOTE — Progress Notes (Addendum)
Patient's sister, now at bedside, reported that patient was responsive and appropriate this evening after surgery but is now difficult to arouse. She stated that this is different than earlier and after prior surgeries no complications with anesthesia. Patient remains confused and only arouses to painful stimuli. Benedetto Coons, NP notified about this and pt having infrequent beats of VT that was also noted by dayshift. New orders received. Will continue to monitor.

## 2014-02-16 DIAGNOSIS — J449 Chronic obstructive pulmonary disease, unspecified: Secondary | ICD-10-CM

## 2014-02-16 LAB — BASIC METABOLIC PANEL
ANION GAP: 4 — AB (ref 5–15)
BUN: 19 mg/dL (ref 6–23)
CALCIUM: 8.5 mg/dL (ref 8.4–10.5)
CO2: 31 mmol/L (ref 19–32)
Chloride: 101 mEq/L (ref 96–112)
Creatinine, Ser: 1.45 mg/dL — ABNORMAL HIGH (ref 0.50–1.35)
GFR calc Af Amer: 57 mL/min — ABNORMAL LOW (ref >90)
GFR calc non Af Amer: 49 mL/min — ABNORMAL LOW (ref >90)
Glucose, Bld: 171 mg/dL — ABNORMAL HIGH (ref 70–99)
POTASSIUM: 3.9 mmol/L (ref 3.5–5.1)
SODIUM: 136 mmol/L (ref 135–145)

## 2014-02-16 LAB — CBC
HEMATOCRIT: 29.5 % — AB (ref 39.0–52.0)
HEMOGLOBIN: 8.9 g/dL — AB (ref 13.0–17.0)
MCH: 25.2 pg — ABNORMAL LOW (ref 26.0–34.0)
MCHC: 30.2 g/dL (ref 30.0–36.0)
MCV: 83.6 fL (ref 78.0–100.0)
Platelets: 193 10*3/uL (ref 150–400)
RBC: 3.53 MIL/uL — ABNORMAL LOW (ref 4.22–5.81)
RDW: 14.8 % (ref 11.5–15.5)
WBC: 7.7 10*3/uL (ref 4.0–10.5)

## 2014-02-16 LAB — CULTURE, BLOOD (ROUTINE X 2)
CULTURE: NO GROWTH
Culture: NO GROWTH

## 2014-02-16 LAB — TYPE AND SCREEN
ABO/RH(D): B POS
ANTIBODY SCREEN: NEGATIVE
Unit division: 0

## 2014-02-16 LAB — GLUCOSE, CAPILLARY
Glucose-Capillary: 104 mg/dL — ABNORMAL HIGH (ref 70–99)
Glucose-Capillary: 120 mg/dL — ABNORMAL HIGH (ref 70–99)
Glucose-Capillary: 135 mg/dL — ABNORMAL HIGH (ref 70–99)
Glucose-Capillary: 189 mg/dL — ABNORMAL HIGH (ref 70–99)

## 2014-02-16 MED ORDER — DEXTROSE 5 % IV SOLN
1.0000 g | Freq: Two times a day (BID) | INTRAVENOUS | Status: DC
  Administered 2014-02-16 – 2014-02-17 (×2): 1 g via INTRAVENOUS
  Filled 2014-02-16 (×3): qty 1

## 2014-02-16 NOTE — Progress Notes (Addendum)
[facility] PROGRESS NOTE Interim history: 65 year old male with past medical history a recent aorta femoral bypass, who came in with acute hypoxic respiratory failure, sepsis and elevated cardiac enzymes. Started on empiric antibiotics. CT imaging of the Chest was done in ED showed no PE. He also developed acute kidney injury probably due to sepsis, ACE inhibitor and contrast-induced nephropathy which resolved. Groin ultrasound showed abscess, vascular surgery consulted recommended a CT scan of the abdomen and pelvis with contrast emergently on 02/14/2014 which showed multiple loculated abscesses and possible infected native graft, he is  status post I&D and possible graft infection on 02/15/2014. Blood cultures and abscess cultures are pending. He is on IV fluid to decrease the risk of contrast-induced nephropathy. Continue to monitor creatinine. Weight continues to be stable. He'll need an ischemic evaluation as an outpatient. Due to SOB and elevated CVP he was given IV lasix with good respond. And transfuse 1 unit of PRBC.   Filed Weights   02/11/14 1328 02/11/14 2059 02/12/14 0535  Weight: 89.812 kg (198 lb) 90 kg (198 lb 6.6 oz) 88.2 kg (194 lb 7.1 oz)        Intake/Output Summary (Last 24 hours) at 02/16/14 0819 Last data filed at 02/16/14 1610  Gross per 24 hour  Intake   1485 ml  Output   4725 ml  Net  -3240 ml     Assessment/Plan: Acute respiratory failure with hypoxia - Unclear etiology, BNP was elevated, with a significant elevation in WBC and fever. Likely due to sepsis of the groin and demand ischemia. - Currently satting 100% on 2 L.  - Now resolved.  Sepsis? Due groin abscess with possible infected graft: - Ct angio of graft showed multiple abscess and possible infected native graft, Appreciate vascular surgery Assistance.  - Empiric vancomycin and cefepime started on 12.30.2015 - Blood cultures negative till date. - S/p I&D of groin abscess, cultures pending.  Vascular would like to change the dressing due to the high risk of bleeding. - awaiting further recommendations.  AKI (acute kidney injury)/acute toxic nephropathy: - Multifactorial due to sepsis, ACE inhibitor and contrast-induced nephropathy. - Mild increase in renal failure. Last CVP 8. - cont to monitor.  Elevated Troponin's: - Cont aspirin, statin and beta blockers. - Cardiology on board,recommended an ischemic as an outpatient - Echo as below  Hyponatremia - Resolved with hydration.  Chronic obstructive pulmonary disease - Stable.  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  Essential hypertension - Increase Imdur. - Blood pressure mildly high.   Normocytic anemia: - Ferritin will be unreliable transfuse 2 units of packed red blood cells. - try to keep Hbg closer to 9 due to the possibility of ischemic heart disease.  Code Status: Full code  DVT Prophylaxis: Heparin Family Communication: I discussed the plan with the patient at the bedside.  Disposition Plan: Home when medically stable.    Consultants:  Cardiology  Procedures: ECHO: EF normal no wall motion. Pelvic US 12.30.2015: Large 11.4 x 10.0 x 4.6 cm right groin complex fluid collection noted  Antibiotics:  Vanco and cefepime 02/11/2014  HPI/Subjective: No complains feels btter  Objective: Filed Vitals:   02/15/14 2030 02/15/14 2307 02/16/14 0010 02/16/14 0349  BP:   125/53 136/72  Pulse:  76 75 82  Temp:  97.8 F (36.6 C)  98.6 F (37 C)  TempSrc:  Oral  Oral  Resp:  33 20 18  Height:      Weight:      SpO2: 100% 100% 98% 98%     Exam:  General: Alert, awake, oriented x3, in no acute distress.  HEENT: No bruits, no goiter. Negative JVD Heart: Regular rate and rhythm. Lungs: Good air movement, crackles on the right Abdomen: Soft, nontender, nondistended, positive bowel sounds.   Skin: dressing of right groin   Data Reviewed: Basic Metabolic Panel:  Recent Labs Lab 02/12/14 0155 02/13/14 0758 02/14/14 0410 02/15/14 1415 02/15/14 2245 02/16/14 0350  NA 132* 134* 133* 136  --  136  K 3.8 4.2 4.2 4.2  --  3.9  CL 102 104 104 104  --  101  CO2 23 25 23 25   --  31  GLUCOSE 188* 131* 97 115*  --  171*  BUN 16 24* 23 17  --  19  CREATININE 1.67* 1.89* 1.71* 1.30  --  1.45*  CALCIUM 8.4 8.0* 7.9* 8.3*  --  8.5  MG  --   --   --   --  1.8  --    Liver Function Tests:  Recent Labs Lab 02/10/14 1206 02/11/14 1429 02/12/14 0155  AST 14 15 34  ALT 12 15 16   ALKPHOS 63 61 50  BILITOT 0.7 1.1 1.2  PROT 6.9 7.7 6.2  ALBUMIN 3.6 3.6 2.9*   No results for input(s): LIPASE, AMYLASE in the last 168 hours. No results for input(s): AMMONIA in the last 168 hours. CBC:  Recent Labs Lab 02/10/14 1206 02/11/14 1429 02/12/14 0155 02/13/14 0720 02/14/14 0410 02/15/14 0444 02/16/14 0350  WBC 5.4 17.0* 17.5* 15.4* 10.7* 10.1 7.7  NEUTROABS 3.0 15.3*  --   --   --   --   --   HGB 10.7* 10.9* 9.6* 8.9* 7.8* 7.8* 8.9*  HCT 34.0* 35.0* 31.0* 28.6* 25.5* 24.9* 29.5*  MCV 84.6 87.3 86.4 85.4 84.7 86.5 83.6  PLT 297 293 248 207 189 179 193   Cardiac Enzymes:  Recent Labs Lab 02/12/14 0155 02/12/14 1045 02/12/14 1532 02/13/14 0005  TROPONINI 8.01* 3.07* 2.31* 1.32*   BNP (last 3 results) No results for input(s): PROBNP in the last 8760 hours. CBG:  Recent Labs Lab 02/15/14 0820 02/15/14 1031 02/15/14 1607 02/15/14 1952 02/15/14 2135  GLUCAP 129* 107* 120* 153* 164*    Recent Results (from the past 240 hour(s))  Blood culture (routine x 2)     Status: None (Preliminary result)   Collection Time: 02/11/14  2:29 PM  Result Value Ref Range Status   Specimen Description BLOOD RIGHT HAND  Final   Special Requests BOTTLES DRAWN AEROBIC AND ANAEROBIC 4CC  Final   Culture NO GROWTH 4 DAYS  Final   Report Status PENDING  Incomplete  Blood culture  (routine x 2)     Status: None (Preliminary result)   Collection Time: 02/11/14  2:33 PM  Result Value Ref Range Status   Specimen Description BLOOD RIGHT ANTECUBITAL  Final   Special Requests BOTTLES DRAWN AEROBIC ONLY 6CC  Final   Culture NO GROWTH 4 DAYS  Final   Report Status PENDING  Incomplete  Urine culture     Status: None   Collection Time: 02/11/14  2:56 PM  Result Value Ref Range Status   Specimen Description URINE, CLEAN CATCH  Final   Special Requests NONE  Final   Colony Count NO GROWTH Performed at [facility]   Final   Culture NO  GROWTH Performed at [facility]   Final   Report Status 02/12/2014 FINAL  Final  MRSA PCR Screening     Status: None   Collection Time: 02/11/14  6:40 PM  Result Value Ref Range Status   MRSA by PCR NEGATIVE NEGATIVE Final    Comment:        The GeneXpert MRSA Assay (FDA approved for NASAL specimens only), is one component of a comprehensive MRSA colonization surveillance program. It is not intended to diagnose MRSA infection nor to guide or monitor treatment for MRSA infections.      Studies: Ct Angio Abd/pel W/ And/or W/o  02/14/2014   CLINICAL DATA:  Complex fluid collection of the right groin and status post prior aortobifemoral bypass grafting in 2007 with prior revision of the mid pole right graft limb  EXAM: CTA ABDOMEN AND PELVIS WITHOUT CONTRAST  TECHNIQUE: Multidetector CT imaging of the abdomen and pelvis was performed using the standard protocol during bolus administration of intravenous contrast. Multiplanar reconstructed images and MIPs were obtained and reviewed to evaluate the vascular anatomy.  CONTRAST:  80mL OMNIPAQUE IOHEXOL 350 MG/ML SOLN  COMPARISON:  Right groin ultrasound on 02/12/2014  FINDINGS: Complex fluid collection of the right inguinal region present with 3 separate components that likely communicate with 1 another. Superior component is associated with the inferior abdominal wall and  measures approximately 5.5 cm in diameter. Surrounding inflammation present. The deep portion of this fluid collection abuts the distal right limb of an aortobifemoral graft without evidence of graft pseudoaneurysm or erosion. No air is identified adjacent to the graft or in the fluid collection  Communicating second pocket of fluid extends towards the lower inguinal region and measures approximately 5 cm in diameter. There is a soft tissue wound just superficial to the second component of loculated fluid. The wound itself does not appear to extend all the way to the level of fluid. Third communicating pocket extends into the right inguinal canal and measures approximately 6 cm in greatest diameter. In the coronal projection, all of the fluid collections at up to roughly 12 cm in maximal diameter. Findings are likely consistent with multi loculated abscess given constellation of CT and ultrasound findings.  The aortobifemoral graft itself shows normal patency. Both native SFA origins appear to likely be occluded chronically. Native abdominal aorta above the graft shows no evidence of aneurysmal disease or significant stenosis. There is approximately 80% narrowing at the origin of the celiac axis. The superior mesenteric artery trunk shows diffuse and significant atherosclerosis. Bilateral renal arteries are heavily calcified proximally.  The liver shows steatosis. There is reflux of contrast into the intrahepatic IVC and hepatic veins consistent with a component of right heart failure. The visualized lung bases show small bilateral pleural effusions.  Rounded hyperdense cortical lesion of the lateral mid left kidney measures approximately 13 mm in diameter. This represents either a hemorrhagic cyst or enhancing cortical lesion. No abnormalities are seen involving bowel. The bladder appears unremarkable. No bony abnormalities.  Review of the MIP images confirms the above findings.  IMPRESSION: 1. Complex and multi  lobulated fluid collection in the right inguinal region with 3 separate components present. These likely communicate with one another and span over a distance of roughly 12 cm. Part of the fluid collection does abut the distal right limb of the aortobifemoral graft. No evidence of gas in the fluid or graft pseudoaneurysm. This most likely represents a complex abscess. Inferior component of fluid extends into the  right inguinal canal. 2. Probable underlying right heart failure with small bilateral pleural effusions. 3. 13 mm hyperdense cortical lesion of the mid lateral left renal cortex. This is incompletely evaluated on the CTA study. This could represent a hyperdense/hemorrhagic cyst or enhancing cortical neoplasm. Followup is recommended. The most definitive evaluation would be MRI of the abdomen when the patient is able to tolerate MRI examination.   Electronically Signed   By: Irish Lack M.D.   On: 02/14/2014 15:41    Scheduled Meds: . sodium chloride   Intravenous Once  . aspirin  81 mg Oral Daily  . atorvastatin  80 mg Oral q1800  . ceFEPime (MAXIPIME) IV  1 g Intravenous Q24H  . ferrous sulfate  325 mg Oral BID  . gabapentin  300 mg Oral TID  . insulin aspart  0-20 Units Subcutaneous TID WC  . insulin aspart  0-5 Units Subcutaneous QHS  . insulin detemir  50 Units Subcutaneous Q2200  . isosorbide mononitrate  30 mg Oral Daily  . metoprolol  25 mg Oral BID  . oseltamivir  30 mg Oral BID  . sodium chloride  10-40 mL Intracatheter Q12H  . sodium chloride  3 mL Intravenous Q12H  . triamcinolone cream  1 application Topical BID  . vancomycin  750 mg Intravenous Q12H   Continuous Infusions:     Marinda Elk  [facility] Pager [phone] If 8PM-8AM, please contact night-coverage at [email or web address], password [facility] 02/16/2014, 8:19 AM  LOS: 5 days

## 2014-02-16 NOTE — Progress Notes (Signed)
     SUBJECTIVE:  64 yo male hx of COPD , hx of CAD, CABG, DM 2, PVD, OSA , SSS , s/p pacer,  admitted with acute respiratory failure.   H had a recetn aorta femoral bypass and Was found to have an abscess in his groin .    He had I & D of the abscess by Dr. Darrick Penna on Jan 2.     No chest pain or SOB.   BP 155/78 mmHg  Pulse 69  Temp(Src) 98.6 F (37 C) (Oral)  Resp 18  Ht  (1.651 m)  Wt 194 lb 7.1 oz (88.2 kg)  BMI 32.36 kg/m2  SpO2 96%  Intake/Output Summary (Last 24 hours) at 02/16/14 1017 Last data filed at 02/16/14 0643  Gross per 24 hour  Intake    985 ml  Output   4725 ml  Net  -3740 ml    PHYSICAL EXAM General: Well developed, well nourished, in no acute distress. Alert and oriented x 3.  Psych:  Good affect, responds appropriately Neck: No JVD. No masses noted.  Lungs: Clear bilaterally with no wheezes or rhonci noted.  Heart: RRR with no murmurs noted. Abdomen: Bowel sounds are present. Soft, non-tender.  Extremities: No lower extremity edema.   LABS: Basic Metabolic Panel:  Recent Labs  56/21/30 1415 02/15/14 2245 02/16/14 0350  NA 136  --  136  K 4.2  --  3.9  CL 104  --  101  CO2 25  --  31  GLUCOSE 115*  --  171*  BUN 17  --  19  CREATININE 1.30  --  1.45*  CALCIUM 8.3*  --  8.5  MG  --  1.8  --    CBC:  Recent Labs  02/15/14 0444 02/16/14 0350  WBC 10.1 7.7  HGB 7.8* 8.9*  HCT 24.9* 29.5*  MCV 86.5 83.6  PLT 179 193   Cardiac Enzymes: No results for input(s): CKTOTAL, CKMB, CKMBINDEX, TROPONINI in the last 72 hours. Current Meds: . sodium chloride   Intravenous Once  . aspirin  81 mg Oral Daily  . atorvastatin  80 mg Oral q1800  . ceFEPime (MAXIPIME) IV  1 g Intravenous Q12H  . ferrous sulfate  325 mg Oral BID  . gabapentin  300 mg Oral TID  . insulin aspart  0-20 Units Subcutaneous TID WC  . insulin aspart  0-5 Units Subcutaneous QHS  . insulin detemir  50 Units Subcutaneous Q2200  . isosorbide mononitrate  30 mg  Oral Daily  . metoprolol  25 mg Oral BID  . sodium chloride  10-40 mL Intracatheter Q12H  . sodium chloride  3 mL Intravenous Q12H  . triamcinolone cream  1 application Topical BID  . vancomycin  750 mg Intravenous Q12H   ASSESSMENT AND PLAN:  1. Elevated troponin: In setting of acute infectious process. He does have diabetes and known CAD. Troponin is trending down. I do not think this is an acute coronary syndrome. Will drop in Hgb, could stop IV heparin today. Continue ASA, statin, beta blocker, Imdur. Echo 02/12/14 with normal LV function. Will plan on doing a stress or Lexiscan Myoview when his infectious issues are resolved.  No CP    2. CAD s/p CABG: See above.     Vesta Mixer, Montez Hageman., MD, [facility] 02/16/2014, 10:25 AM [address]  [address] Office [phone] Pager [phone]

## 2014-02-16 NOTE — Progress Notes (Addendum)
ANTIBIOTIC CONSULT NOTE - FOLLOW UP  Pharmacy Consult for Vancomycin and Cefepime Indication: R/O Sepsis  Allergies  Allergen Reactions  . Morphine And Related     Pt felt flushed and was diaphoretic     Patient Measurements: Height:  (165.1 cm) Weight: 194 lb 7.1 oz (88.2 kg) IBW/kg (Calculated) : 61.5   Vital Signs: Temp: 98.6 F (37 C) (01/03 0825) Temp Source: Oral (01/03 0825) BP: 155/78 mmHg (01/03 0825) Pulse Rate: 69 (01/03 0825) Intake/Output from previous day: 01/02 0701 - 01/03 0700 In: 1485 [I.V.:500; Blood:635; IV Piggyback:350] Out: 4725 [Urine:4725] Intake/Output from this shift:    Labs:  Recent Labs  02/14/14 0410 02/15/14 0444 02/15/14 1415 02/16/14 0350  WBC 10.7* 10.1  --  7.7  HGB 7.8* 7.8*  --  8.9*  PLT 189 179  --  193  CREATININE 1.71*  --  1.30 1.45*   Estimated Creatinine Clearance: 52.6 mL/min (by C-G formula based on Cr of 1.45).  Recent Labs  02/14/14 1645  VANCOTROUGH 18.6     Microbiology: Recent Results (from the past 720 hour(s))  Blood culture (routine x 2)     Status: None (Preliminary result)   Collection Time: 02/11/14  2:29 PM  Result Value Ref Range Status   Specimen Description BLOOD RIGHT HAND  Final   Special Requests BOTTLES DRAWN AEROBIC AND ANAEROBIC 4CC  Final   Culture NO GROWTH 4 DAYS  Final   Report Status PENDING  Incomplete  Blood culture (routine x 2)     Status: None (Preliminary result)   Collection Time: 02/11/14  2:33 PM  Result Value Ref Range Status   Specimen Description BLOOD RIGHT ANTECUBITAL  Final   Special Requests BOTTLES DRAWN AEROBIC ONLY 6CC  Final   Culture NO GROWTH 4 DAYS  Final   Report Status PENDING  Incomplete  Urine culture     Status: None   Collection Time: 02/11/14  2:56 PM  Result Value Ref Range Status   Specimen Description URINE, CLEAN CATCH  Final   Special Requests NONE  Final   Colony Count NO GROWTH Performed at [facility]   Final   Culture  NO GROWTH Performed at [facility]   Final   Report Status 02/12/2014 FINAL  Final  MRSA PCR Screening     Status: None   Collection Time: 02/11/14  6:40 PM  Result Value Ref Range Status   MRSA by PCR NEGATIVE NEGATIVE Final    Comment:        The GeneXpert MRSA Assay (FDA approved for NASAL specimens only), is one component of a comprehensive MRSA colonization surveillance program. It is not intended to diagnose MRSA infection nor to guide or monitor treatment for MRSA infections.     Anti-infectives    Start     Dose/Rate Route Frequency Ordered Stop   02/16/14 2000  ceFEPIme (MAXIPIME) 1 g in dextrose 5 % 50 mL IVPB     1 g100 mL/hr over 30 Minutes Intravenous Every 12 hours 02/16/14 0938     02/14/14 0730  ceFEPIme (MAXIPIME) 1 g in dextrose 5 % 50 mL IVPB  Status:  Discontinued     1 g100 mL/hr over 30 Minutes Intravenous Every 24 hours 02/13/14 1035 02/16/14 0938   02/12/14 1000  oseltamivir (TAMIFLU) capsule 75 mg  Status:  Discontinued     75 mg Oral 2 times daily 02/12/14 0743 02/12/14 0747   02/12/14 1000  oseltamivir (TAMIFLU) capsule  30 mg  Status:  Discontinued     30 mg Oral 2 times daily 02/12/14 0749 02/16/14 0826   02/12/14 0600  vancomycin (VANCOCIN) IVPB 750 mg/150 ml premix     750 mg150 mL/hr over 60 Minutes Intravenous Every 12 hours 02/11/14 2047     02/12/14 0000  ceFEPIme (MAXIPIME) 1 g in dextrose 5 % 50 mL IVPB  Status:  Discontinued     1 g100 mL/hr over 30 Minutes Intravenous Every 8 hours 02/11/14 2047 02/13/14 1035   02/11/14 1545  vancomycin (VANCOCIN) 1,500 mg in sodium chloride 0.9 % 500 mL IVPB     1,500 mg250 mL/hr over 120 Minutes Intravenous  Once 02/11/14 1537 02/11/14 1940   02/11/14 1545  ceFEPIme (MAXIPIME) 2 g in dextrose 5 % 50 mL IVPB     2 g100 mL/hr over 30 Minutes Intravenous  Once 02/11/14 1537 02/11/14 1642      Assessment: 63 yoM on vancomycin and cefepime Day#6 for rule-out sepsis secondary to groin abscess with  possible infected graft.  Patient is afebrile, WBC WNL. Blood cultures have no growth to date. Wound and body fluid cultures are pending.  VT was therapeutic at 18.6 on 1/1. Patient's renal function has been variable since admission with SCr peaking at 1.89 on 02/13/14.  SCr has trended down to 1.45. Patient has had good urine output.    Goal of Therapy:  Vancomycin trough level 15-20 mcg/ml  Plan:  - Continue vanc  IV Q12H  - Increase cefepime 1gm IV Q24H to Q12H due to improved renal function and increased urine output - F/u renal fxn, C&S, clinical status and trough at [facility]  Russ Halo, PharmD Clinical Pharmacist - Resident Pager: [phone] 1/3/20169:52 AM

## 2014-02-16 NOTE — Progress Notes (Signed)
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 Sepsis currently controlled Follow up on intraop cultures to tailor antibiotics Dr Edilia Bo to eval tomorrow for definitive plan on possible graft removal. Wound care per MD only for now   Doaa Kendzierski E 02/16/2014 8:02 AM --  Laboratory Lab Results:  Recent Labs  02/15/14 0444 02/16/14 0350  WBC 10.1 7.7  HGB 7.8* 8.9*  HCT 24.9* 29.5*  PLT 179 193   BMET  Recent Labs  02/15/14 1415 02/16/14 0350  NA 136 136  K 4.2 3.9  CL 104 101  CO2 25 31  GLUCOSE 115* 171*  BUN 17 19  CREATININE 1.30 1.45*  CALCIUM 8.3* 8.5    COAG Lab Results  Component Value Date   INR 1.17 02/12/2014   INR 1.02 01/03/2014   INR 1.01 12/31/2013   No results found for: PTT

## 2014-02-17 ENCOUNTER — Encounter (HOSPITAL_COMMUNITY): Payer: Self-pay | Admitting: Vascular Surgery

## 2014-02-17 DIAGNOSIS — N179 Acute kidney failure, unspecified: Secondary | ICD-10-CM

## 2014-02-17 LAB — GLUCOSE, CAPILLARY
GLUCOSE-CAPILLARY: 145 mg/dL — AB (ref 70–99)
GLUCOSE-CAPILLARY: 218 mg/dL — AB (ref 70–99)
Glucose-Capillary: 145 mg/dL — ABNORMAL HIGH (ref 70–99)

## 2014-02-17 LAB — CBC
HCT: 29.6 % — ABNORMAL LOW (ref 39.0–52.0)
Hemoglobin: 9 g/dL — ABNORMAL LOW (ref 13.0–17.0)
MCH: 25.6 pg — ABNORMAL LOW (ref 26.0–34.0)
MCHC: 30.4 g/dL (ref 30.0–36.0)
MCV: 84.3 fL (ref 78.0–100.0)
Platelets: 244 10*3/uL (ref 150–400)
RBC: 3.51 MIL/uL — ABNORMAL LOW (ref 4.22–5.81)
RDW: 14.7 % (ref 11.5–15.5)
WBC: 7.3 10*3/uL (ref 4.0–10.5)

## 2014-02-17 LAB — WOUND CULTURE
CULTURE: NO GROWTH
Gram Stain: NONE SEEN

## 2014-02-17 LAB — BASIC METABOLIC PANEL
Anion gap: 6 (ref 5–15)
BUN: 15 mg/dL (ref 6–23)
CO2: 29 mmol/L (ref 19–32)
CREATININE: 1.37 mg/dL — AB (ref 0.50–1.35)
Calcium: 8.6 mg/dL (ref 8.4–10.5)
Chloride: 103 mEq/L (ref 96–112)
GFR calc Af Amer: 61 mL/min — ABNORMAL LOW (ref >90)
GFR calc non Af Amer: 53 mL/min — ABNORMAL LOW (ref >90)
Glucose, Bld: 178 mg/dL — ABNORMAL HIGH (ref 70–99)
Potassium: 3.9 mmol/L (ref 3.5–5.1)
SODIUM: 138 mmol/L (ref 135–145)

## 2014-02-17 LAB — VANCOMYCIN, TROUGH: VANCOMYCIN TR: 19.6 ug/mL (ref 10.0–20.0)

## 2014-02-17 LAB — BODY FLUID CULTURE

## 2014-02-17 LAB — BRAIN NATRIURETIC PEPTIDE: B Natriuretic Peptide: 1410.9 pg/mL — ABNORMAL HIGH (ref 0.0–100.0)

## 2014-02-17 MED ORDER — CETYLPYRIDINIUM CHLORIDE 0.05 % MT LIQD
7.0000 mL | Freq: Two times a day (BID) | OROMUCOSAL | Status: DC
  Administered 2014-02-17 – 2014-03-04 (×24): 7 mL via OROMUCOSAL

## 2014-02-17 NOTE — Progress Notes (Signed)
   VASCULAR SURGERY ASSESSMENT & PLAN:  * The right groin wound looks clean. I have consulted Plastic Surgery to see if he is a candidate for a gracillis or sartorius flap to cover the exposed graft. By CT scan, the graft does not look infected. Will order bid dressing changes with hydrogel.   *  ID: fluid from Right groin grew Moderate MRSA. The patient is on Vancomycin and Cefepime. Given that he has a prosthetic graft, he will require long term antibiotics.   * If he were not a candidate for a flap to cover the graft, he would likely require removal of the right limb of his AFBG and right Ax-pop? He would need CT angio of LE to define runoff (ie potential target for bypass) on the right. This would be high risk given his cardiac history.   * Cardiology considering Stress test or Lexiscan Myoview. Would proceed when they can given that one way or the other, he will likely require surgery.   SUBJECTIVE: Says that he is still having chest pain.  PHYSICAL EXAM: Filed Vitals:   02/16/14 1945 02/16/14 2314 02/17/14 0425 02/17/14 0735  BP: 152/72 142/76 171/79 158/81  Pulse: 71 78 70 70  Temp: 97.9 F (36.6 C) 99 F (37.2 C) 99.2 F (37.3 C) 98 F (36.7 C)  TempSrc: Oral Oral Oral Oral  Resp: Height:      Weight:      SpO2: 97% 100% 94% 94%   Right groin wound inspected and looks clean with some serous drainage.  LABS: Lab Results  Component Value Date   WBC 7.3 02/17/2014   HGB 9.0* 02/17/2014   HCT 29.6* 02/17/2014   MCV 84.3 02/17/2014   PLT 244 02/17/2014   Lab Results  Component Value Date   CREATININE 1.37* 02/17/2014   Lab Results  Component Value Date   INR 1.17 02/12/2014   CBG (last 3)   Recent Labs  02/16/14 1614 02/16/14 2114 02/17/14 0734  GLUCAP 135* 189* 145*    Principal Problem:   Acute respiratory failure with hypoxia Active Problems:   Essential hypertension   Diabetes mellitus type 2 with atherosclerosis of arteries of  extremities   Chronic obstructive pulmonary disease   Obesity   AKI (acute kidney injury)   Elevated troponin I level   Hyponatremia   Sepsis   Toxic nephropathy   Elevated troponin   Coronary artery disease involving coronary bypass graft of native heart with unstable angina pectoris   [name] Beeper: [phone] 02/17/2014

## 2014-02-17 NOTE — Progress Notes (Signed)
ANTIBIOTIC CONSULT NOTE - FOLLOW UP  Pharmacy Consult for Vancomycin and Cefepime Indication: R/O Sepsis  Allergies  Allergen Reactions  . Morphine And Related     Pt felt flushed and was diaphoretic     Patient Measurements: Height: 5\' 5"  (165.1 cm) Weight: 194 lb 7.1 oz (88.2 kg) IBW/kg (Calculated) : 61.5   Vital Signs: Temp: 98.2 F (36.8 C) (01/04 1636) Temp Source: Oral (01/04 1636) BP: 163/52 mmHg (01/04 1157) Pulse Rate: 77 (01/04 1157) Intake/Output from previous day: 01/03 0701 - 01/04 0700 In: 680 [P.O.:480; IV Piggyback:200] Out: 925 [Urine:925] Intake/Output from this shift: Total I/O In: 600 [P.O.:600] Out: 500 [Urine:500]  Labs:  Recent Labs  02/15/14 0444 02/15/14 1415 02/16/14 0350 02/17/14 0420  WBC 10.1  --  7.7 7.3  HGB 7.8*  --  8.9* 9.0*  PLT 179  --  193 244  CREATININE  --  1.30 1.45* 1.37*   Estimated Creatinine Clearance: 55.6 mL/min (by C-G formula based on Cr of 1.37).  Recent Labs  02/14/14 1645 02/17/14 1535  VANCOTROUGH 18.6 19.6     Microbiology: Recent Results (from the past 720 hour(s))  Blood culture (routine x 2)     Status: None   Collection Time: 02/11/14  2:29 PM  Result Value Ref Range Status   Specimen Description BLOOD RIGHT HAND  Final   Special Requests BOTTLES DRAWN AEROBIC AND ANAEROBIC 4CC  Final   Culture NO GROWTH 5 DAYS  Final   Report Status 02/16/2014 FINAL  Final  Blood culture (routine x 2)     Status: None   Collection Time: 02/11/14  2:33 PM  Result Value Ref Range Status   Specimen Description BLOOD RIGHT ANTECUBITAL  Final   Special Requests BOTTLES DRAWN AEROBIC ONLY 6CC  Final   Culture NO GROWTH 5 DAYS  Final   Report Status 02/16/2014 FINAL  Final  Urine culture     Status: None   Collection Time: 02/11/14  2:56 PM  Result Value Ref Range Status   Specimen Description URINE, CLEAN CATCH  Final   Special Requests NONE  Final   Colony Count NO GROWTH Performed at [facility]   Final   Culture NO GROWTH Performed at [facility]   Final   Report Status 02/12/2014 FINAL  Final  MRSA PCR Screening     Status: None   Collection Time: 02/11/14  6:40 PM  Result Value Ref Range Status   MRSA by PCR NEGATIVE NEGATIVE Final    Comment:        The GeneXpert MRSA Assay (FDA approved for NASAL specimens only), is one component of a comprehensive MRSA colonization surveillance program. It is not intended to diagnose MRSA infection nor to guide or monitor treatment for MRSA infections.   Wound culture     Status: None   Collection Time: 02/14/14 10:08 AM  Result Value Ref Range Status   Specimen Description WOUND GROIN RIGHT  Final   Special Requests NONE  Final   Gram Stain   Final    NO WBC SEEN NO SQUAMOUS EPITHELIAL CELLS SEEN NO ORGANISMS SEEN Performed at [facility]    Culture   Final    NO GROWTH 3 DAYS Performed at [facility]    Report Status 02/17/2014 FINAL  Final  Body fluid culture     Status: None   Collection Time: 02/15/14  9:49 AM  Result Value Ref Range Status   Specimen  Description FLUID RIGHT DRAINAGE GROIN  Final   Special Requests NONE  Final   Gram Stain   Final    ABUNDANT WBC PRESENT, PREDOMINANTLY PMN RARE GRAM POSITIVE COCCI IN PAIRS Performed at [facility]    Culture   Final    MODERATE METHICILLIN RESISTANT STAPHYLOCOCCUS AUREUS Note: RIFAMPIN AND GENTAMICIN SHOULD NOT BE USED AS SINGLE DRUGS FOR TREATMENT OF STAPH INFECTIONS. This organism DOES NOT demonstrate inducible Clindamycin resistance in vitro. CRITICAL RESULT CALLED TO, READ BACK BY AND VERIFIED WITH: CAROL@8 :58AM ON  02/17/13 BY DANTS Performed at [facility]    Report Status 02/17/2014 FINAL  Final   Organism ID, Bacteria METHICILLIN RESISTANT STAPHYLOCOCCUS AUREUS  Final      Susceptibility   Methicillin resistant staphylococcus aureus - MIC*    CLINDAMYCIN <=0.25 SENSITIVE Sensitive     ERYTHROMYCIN  >=8 RESISTANT Resistant     GENTAMICIN <=0.5 SENSITIVE Sensitive     LEVOFLOXACIN >=8 RESISTANT Resistant     OXACILLIN >=4 RESISTANT Resistant     PENICILLIN >=0.5 RESISTANT Resistant     RIFAMPIN <=0.5 SENSITIVE Sensitive     TRIMETH/SULFA >=320 RESISTANT Resistant     VANCOMYCIN 1 SENSITIVE Sensitive     TETRACYCLINE <=1 SENSITIVE Sensitive     * MODERATE METHICILLIN RESISTANT STAPHYLOCOCCUS AUREUS    Anti-infectives    Start     Dose/Rate Route Frequency Ordered Stop   02/16/14 2000  ceFEPIme (MAXIPIME) 1 g in dextrose 5 % 50 mL IVPB  Status:  Discontinued     1 g100 mL/hr over 30 Minutes Intravenous Every 12 hours 02/16/14 0938 02/17/14 1357   02/14/14 0730  ceFEPIme (MAXIPIME) 1 g in dextrose 5 % 50 mL IVPB  Status:  Discontinued     1 g100 mL/hr over 30 Minutes Intravenous Every 24 hours 02/13/14 1035 02/16/14 0938   02/12/14 1000  oseltamivir (TAMIFLU) capsule 75 mg  Status:  Discontinued     75 mg Oral 2 times daily 02/12/14 0743 02/12/14 0747   02/12/14 1000  oseltamivir (TAMIFLU) capsule 30 mg  Status:  Discontinued     30 mg Oral 2 times daily 02/12/14 0749 02/16/14 0826   02/12/14 0600  vancomycin (VANCOCIN) IVPB 750 mg/150 ml premix     750 mg150 mL/hr over 60 Minutes Intravenous Every 12 hours 02/11/14 2047     02/12/14 0000  ceFEPIme (MAXIPIME) 1 g in dextrose 5 % 50 mL IVPB  Status:  Discontinued     1 g100 mL/hr over 30 Minutes Intravenous Every 8 hours 02/11/14 2047 02/13/14 1035   02/11/14 1545  vancomycin (VANCOCIN) 1,500 mg in sodium chloride 0.9 % 500 mL IVPB     1,500 mg250 mL/hr over 120 Minutes Intravenous  Once 02/11/14 1537 02/11/14 1940   02/11/14 1545  ceFEPIme (MAXIPIME) 2 g in dextrose 5 % 50 mL IVPB     2 g100 mL/hr over 30 Minutes Intravenous  Once 02/11/14 1537 02/11/14 1642      Assessment: 63 yoM on vancomycin and cefepime Day#7 for rule-out sepsis secondary to groin abscess with possible infected graft.  Patient is afebrile, WBC WNL. Blood  cultures have no growth to date. Wound and body fluid cultures are pending. Patient's renal function has been variable since admission with SCr peaking at 1.89 on 02/13/14.    Vancomycin trough remains therapeutic at 19.6 on vancomycin  IV q12h. Pt is afebrile, WBC wnl, and sCr trending back down to 1.37 from 1.45.  12/29 Blood Cx: neg   12/29 Urine Cx: neg   1/1 Wound Cx: neg   1/4 Fluid Cx: mod MRSA  Goal of Therapy:  Vancomycin trough level 15-20 mcg/ml  Plan:  - Continue vancomycin  IV q12h  - Continue cefepime 1gm IV q12h - F/u renal fxn, C&S, clinical status and trough at [facility]. Newman Pies, PharmD Clinical Pharmacist Pager [phone]  1/4/20164:38 PM

## 2014-02-17 NOTE — Consult Note (Signed)
Reason for Consult:possible muscle flap coverage of right groin exposed vascular graft Referring Physician: Dr. Kathy Breach [name] is an 64 y.o. male.  HPI: [name] is a 64 yo male with systemic vascular disease, DM, CVA, HTN, COPD who had undergone an aortobifemoral bypass remotely in 2007. He developed occlusion of the left limb and underwent revision of both limbs of the femoral bypass due to high grade stenosis in November. He was readmitted with chest pain by cardiology and fever. He was found to have an abscess of the right groin at this time and underwent I&D of the right groin area. Cultures grew moderate MRSA and he continues on Vanc and Cefepime and will require long term antibiotics per vascular given his prosthetic graft. He continues to have elevated troponin1, unstable angina, AKI, and anemia requiring transfusion. His respiratory failure appears to be improving.    The right groin wound measures 5.5 x 3 cm with a tunnel to 5.5 cm with exposed graft. The wound bed is clean with pink granulation present and scant serous drainage.  We are asked to evaluate for possible muscle flap coverage of the graft.   He has continued to have ongoing cardiac issues and ischemic work up is planned, but regardless, it is felt he will require further surgery, either a flap or possible amputation.   HbA1C in Novemebr 2015  Past Medical History  Diagnosis Date  . Arteriosclerotic cardiovascular disease (ASCVD)     CABG in 2003 for 3 VD; 07/2007: Stress nuclear-inferior infarction without ischemia; normal EF by echo in 06/2005  . Diabetes mellitus type II   . Hyperlipidemia   . Degenerative joint disease     Left knee  . Cerebrovascular disease 2003    Carotid ultrasound in 2010: Patent right CEA site, 60-79% left internal carotid artery  . Peripheral vascular disease 2007    aortobifemoral BPG-2007  . Anemia   . Peripheral neuropathy   . Hypertension   . Sick sinus syndrome 1996     permanent pacemaker-1996; generator change in 2010  . Chronic obstructive pulmonary disease   . Obesity   . Obstructive sleep apnea   . Benign prostatic hypertrophy   . Gastroesophageal reflux disease   . Tobacco abuse, in remission     50 pack years total consumption; Quit in 2007    Past Surgical History  Procedure Laterality Date  . Coronary artery bypass graft  2003    three-vessel disease  . Iliac artery stent      Percutaneous intervention-right external iliac; unclear whether stent was placed  . Pacemaker insertion  1996    generator change in 2010  . Carotid endarterectomy  2003    Right  . Pacemaker insertion      Generator replaced in 04/2008  . Aorta - bilateral femoral artery bypass graft  2007  . Femoral-popliteal bypass graft  1998    bilateral  . Colonoscopy  2012    Negative screening study.  . Ileocolonoscopy  02/20/2007    PPI:RJJOAC rectum, colon, and terminal ileum  . Femoral-femoral bypass graft Bilateral 01/03/2014    Procedure: BYPASS GRAFT RIGHT FEMORAL-LEFT FEMORAL ARTERY ;  Surgeon: Angelia Mould, MD;  Location: [place];  Service: Vascular;  Laterality: Bilateral;  . Endarterectomy femoral Left 01/03/2014    Procedure: ENDARTERECTOMY FEMORAL;  Surgeon: Angelia Mould, MD;  Location: [place];  Service: Vascular;  Laterality: Left;  . Intraoperative arteriogram Left 01/03/2014    Procedure: INTRA  OPERATIVE ARTERIOGRAM, LEFT LOWER LEG;  Surgeon: Angelia Mould, MD;  Location: [place];  Service: Vascular;  Laterality: Left;  . Abdominal angiogram N/A 01/02/2014    Procedure: ABDOMINAL ANGIOGRAM;  Surgeon: Conrad [place], MD;  Location[facility] C[facility] LAB;  Service: Cardiovascular;  Laterality: N/A;  . Peripheral vascular catheterization  01/02/2014    Procedure: LOWER EXTREMITY ANGIOGRAPHY;  Surgeon: Conrad [place], MD;  Location: [facility] CATH LAB;  Service: Cardiovascular;;  . Arch aortogram  01/02/2014    Procedure: ARCH AORTOGRAM;  Surgeon: Conrad Sunman, MD;  Location: [facility] CATH LAB;  Service: Cardiovascular;;  . Wound exploration Right 02/15/2014    Procedure: Incision and Drainage Right Groin;  Surgeon: Elam Dutch, MD;  Location: [facility];  Service: Vascular;  Laterality: Right;    Family History  Problem Relation Age of Onset  . Dementia Mother   . Heart attack Father   . Coronary artery disease Father   . Peripheral vascular disease Sister   . Diabetes Brother   . Peripheral vascular disease Brother   . Hypertension Brother   . Coronary artery disease Brother     Social History:  reports that he quit smoking about 9 years ago. His smoking use included Cigarettes. He has a 50 pack-year smoking history. He quit smokeless tobacco use about 18 years ago. He reports that he does not drink alcohol or use illicit drugs.  Allergies:  Allergies  Allergen Reactions  . Morphine And Related     Pt felt flushed and was diaphoretic     Medications: I have reviewed the patient's current medications.  Results for orders placed or performed during the hospital encounter of 02/11/14 (from the past 48 hour(s))  Basic metabolic panel     Status: Abnormal   Collection Time: 02/15/14  2:15 PM  Result Value Ref Range   Sodium 136 135 - 145 mmol/L    Comment: Please note change in reference range.   Potassium 4.2 3.5 - 5.1 mmol/L    Comment: Please note change in reference range.   Chloride 104 96 - 112 mEq/L   CO2 25 19 - 32 mmol/L   Glucose, Bld 115 (H) 70 - 99 mg/dL   BUN 17 6 - 23 mg/dL   Creatinine, Ser 1.30 0.50 - 1.35 mg/dL   Calcium 8.3 (L) 8.4 - 10.5 mg/dL   GFR calc non Af Amer 56 (L) >90 mL/min   GFR calc Af Amer 65 (L) >90 mL/min    Comment: (NOTE) The eGFR has been calculated using the CKD EPI equation. This calculation has not been validated in all clinical situations. eGFR's persistently <90 mL/min signify possible Chronic Kidney Disease.    Anion gap 7 5 - 15  Type and screen     Status: None   Collection Time:  02/15/14  2:15 PM  Result Value Ref Range   ABO/RH(D) B POS    Antibody Screen NEG    Sample Expiration 02/18/2014    Unit Number [id number]    Blood Component Type RED CELLS,LR    Unit division 00    Status of Unit ISSUED,FINAL    Transfusion Status OK TO TRANSFUSE    Crossmatch Result Compatible   Prepare RBC     Status: None   Collection Time: 02/15/14  2:15 PM  Result Value Ref Range   Order Confirmation ORDER PROCESSED BY BLOOD BANK   Glucose, capillary     Status: Abnormal   Collection Time: 02/15/14  4:07 PM  Result Value Ref Range   Glucose-Capillary 120 (H) 70 - 99 mg/dL   Comment 1 Documented in Chart    Comment 2 Notify RN   Glucose, capillary     Status: Abnormal   Collection Time: 02/15/14  7:52 PM  Result Value Ref Range   Glucose-Capillary 153 (H) 70 - 99 mg/dL   Comment 1 Notify RN   Glucose, capillary     Status: Abnormal   Collection Time: 02/15/14  9:35 PM  Result Value Ref Range   Glucose-Capillary 164 (H) 70 - 99 mg/dL   Comment 1 Notify RN   Blood gas, arterial     Status: Abnormal   Collection Time: 02/15/14 10:41 PM  Result Value Ref Range   O2 Content 2.0 L/min   Delivery systems NASAL CANNULA    pH, Arterial 7.387 7.350 - 7.450   pCO2 arterial 46.3 (H) 35.0 - 45.0 mmHg   pO2, Arterial 93.8 80.0 - 100.0 mmHg   Bicarbonate 27.4 (H) 20.0 - 24.0 mEq/L   TCO2 28.9 0 - 100 mmol/L   Acid-Base Excess 2.7 (H) 0.0 - 2.0 mmol/L   O2 Saturation 96.9 %   Patient temperature 97.6    Collection site BRACHIAL ARTERY    Drawn by [id number]    Sample type ARTERIAL DRAW    Allens test (pass/fail) PASS PASS  Magnesium     Status: None   Collection Time: 02/15/14 10:45 PM  Result Value Ref Range   Magnesium 1.8 1.5 - 2.5 mg/dL  CBC     Status: Abnormal   Collection Time: 02/16/14  3:50 AM  Result Value Ref Range   WBC 7.7 4.0 - 10.5 K/uL   RBC 3.53 (L) 4.22 - 5.81 MIL/uL   Hemoglobin 8.9 (L) 13.0 - 17.0 g/dL   HCT 29.5 (L) 39.0 - 52.0 %   MCV 83.6 78.0 -  100.0 fL   MCH 25.2 (L) 26.0 - 34.0 pg   MCHC 30.2 30.0 - 36.0 g/dL   RDW 14.8 11.5 - 15.5 %   Platelets 193 150 - 400 K/uL  Basic metabolic panel     Status: Abnormal   Collection Time: 02/16/14  3:50 AM  Result Value Ref Range   Sodium 136 135 - 145 mmol/L    Comment: Please note change in reference range.   Potassium 3.9 3.5 - 5.1 mmol/L    Comment: Please note change in reference range.   Chloride 101 96 - 112 mEq/L   CO2 31 19 - 32 mmol/L   Glucose, Bld 171 (H) 70 - 99 mg/dL   BUN 19 6 - 23 mg/dL   Creatinine, Ser 1.45 (H) 0.50 - 1.35 mg/dL   Calcium 8.5 8.4 - 10.5 mg/dL   GFR calc non Af Amer 49 (L) >90 mL/min   GFR calc Af Amer 57 (L) >90 mL/min    Comment: (NOTE) The eGFR has been calculated using the CKD EPI equation. This calculation has not been validated in all clinical situations. eGFR's persistently <90 mL/min signify possible Chronic Kidney Disease.    Anion gap 4 (L) 5 - 15  Glucose, capillary     Status: Abnormal   Collection Time: 02/16/14  8:23 AM  Result Value Ref Range   Glucose-Capillary 104 (H) 70 - 99 mg/dL   Comment 1 Documented in Chart    Comment 2 Notify RN   Glucose, capillary     Status: Abnormal   Collection Time: 02/16/14 12:21 PM  Result Value Ref Range   Glucose-Capillary 120 (H) 70 - 99 mg/dL   Comment 1 Documented in Chart    Comment 2 Notify RN   Glucose, capillary     Status: Abnormal   Collection Time: 02/16/14  4:14 PM  Result Value Ref Range   Glucose-Capillary 135 (H) 70 - 99 mg/dL  Glucose, capillary     Status: Abnormal   Collection Time: 02/16/14  9:14 PM  Result Value Ref Range   Glucose-Capillary 189 (H) 70 - 99 mg/dL  Basic metabolic panel     Status: Abnormal   Collection Time: 02/17/14  4:20 AM  Result Value Ref Range   Sodium 138 135 - 145 mmol/L    Comment: Please note change in reference range.   Potassium 3.9 3.5 - 5.1 mmol/L    Comment: Please note change in reference range.   Chloride 103 96 - 112 mEq/L    CO2 29 19 - 32 mmol/L   Glucose, Bld 178 (H) 70 - 99 mg/dL   BUN 15 6 - 23 mg/dL   Creatinine, Ser 1.37 (H) 0.50 - 1.35 mg/dL   Calcium 8.6 8.4 - 10.5 mg/dL   GFR calc non Af Amer 53 (L) >90 mL/min   GFR calc Af Amer 61 (L) >90 mL/min    Comment: (NOTE) The eGFR has been calculated using the CKD EPI equation. This calculation has not been validated in all clinical situations. eGFR's persistently <90 mL/min signify possible Chronic Kidney Disease.    Anion gap 6 5 - 15  CBC     Status: Abnormal   Collection Time: 02/17/14  4:20 AM  Result Value Ref Range   WBC 7.3 4.0 - 10.5 K/uL   RBC 3.51 (L) 4.22 - 5.81 MIL/uL   Hemoglobin 9.0 (L) 13.0 - 17.0 g/dL   HCT 29.6 (L) 39.0 - 52.0 %   MCV 84.3 78.0 - 100.0 fL   MCH 25.6 (L) 26.0 - 34.0 pg   MCHC 30.4 30.0 - 36.0 g/dL   RDW 14.7 11.5 - 15.5 %   Platelets 244 150 - 400 K/uL  Glucose, capillary     Status: Abnormal   Collection Time: 02/17/14  7:34 AM  Result Value Ref Range   Glucose-Capillary 145 (H) 70 - 99 mg/dL   Comment 1 Documented in Chart    Comment 2 Notify RN   Brain natriuretic peptide     Status: Abnormal   Collection Time: 02/17/14 10:30 AM  Result Value Ref Range   B Natriuretic Peptide 1410.9 (H) 0.0 - 100.0 pg/mL    Comment: Please note change in reference range.  Glucose, capillary     Status: Abnormal   Collection Time: 02/17/14 11:45 AM  Result Value Ref Range   Glucose-Capillary 145 (H) 70 - 99 mg/dL   Comment 1 Documented in Chart    Comment 2 Notify RN     Review of Systems  Eyes: Negative for photophobia.   Blood pressure 158/81, pulse 70, temperature 98.1 F (36.7 C), temperature source Oral, resp. rate 20, height $RemoveBe'5\' 5"'MenXNEJHS$  (1.651 m), weight 88.2 kg (194 lb 7.1 oz), SpO2 94 %. Physical Exam  Constitutional: He is oriented to person, place, and time. He appears well-developed and well-nourished. No distress.  HENT:  Head: Normocephalic and atraumatic.  Respiratory: Effort normal. No respiratory  distress.  Musculoskeletal:  The right groin wound measures 5.5 x 3 cm with a tunnel to 5.5 cm with exposed graft. The wound bed is  clean with pink granulation present and scant serous drainage.   Neurological: He is alert and oriented to person, place, and time.  Skin: Skin is warm and dry. No rash noted. No erythema.  Psychiatric: He has a normal mood and affect. His behavior is normal. Judgment and thought content normal.  significant scrotal edema R thigh with multiple scars over medial thigh from prior vascular interventions ABD soft with midline abdominal scar, protuberant, no hernias noted  Assessment/Plan: S/P Revision of femoral grafts with right groin MRSA abscess and exposed graft- Dr. Iran Planas will see later today to evaluate for possible muscle flap coverage of graft.   RAYBURN,SHAWN,PA-C Plastic Surgery [phone]  Patient seen and examined. Wound clean and partially granulated with palpable graft at base.  Counseled pt that current setting of exposed graft puts him at high risk of further infection, bleeding complications. Could try to heal secondarily but again shares these risks. Reviewed options of muscle coverage of graft; muscle coverage would also help clear infection by bringing in vascularized tissue. Sartorius not option given known occlusion SFA. Gracilis and rectus abdominus both options. Review of 12/2013 angio report and recent CT pelvis with apparent patent profundus and medial circumflex branch. In this patient with protuberant abdomen and poor diabetes control, would elect for gracilis first. However, given his multiple intervention in past, hopefully branch to muscle has not been disturbed or occluded. Reviewed with family that this will be a few hours of general anesthetic, may utilize VAC over flap/incision, will have scar along medial thigh, drains. Reviewed time in hospital post procedure will depend on healing, pain. We discussed that if this flap fails or  additional wound healing problems, infection, may require removal graft and amputation.   Will try to schedule by end of week, will need to clarify if Cardiology rec completion of additional testing prior to another long anesthesia.   Irene Limbo, MD [facility] Plastic & Reconstructive Surgery [phone]

## 2014-02-17 NOTE — Progress Notes (Signed)
Report called, pt transferring to 5W05 via bed with belongings, notified sister of new room number

## 2014-02-17 NOTE — Progress Notes (Signed)
Medicare Important Message given? YES  (If response is "NO", the following Medicare IM given date fields will be blank)  Date Medicare IM given: 02/17/14 Medicare IM given by:  Malorie Bigford  

## 2014-02-17 NOTE — Progress Notes (Signed)
Utilization review completed.  

## 2014-02-17 NOTE — Progress Notes (Signed)
NURSING PROGRESS NOTE  GRADYN SHEIN [id number] Transfer Data: 02/17/2014 5:48 PM Attending Provider: Cristal Ford, MD AOZ:[id number], Kingsley Spittle, MD Code Status:FULL  HAIK MAHONEY is a 64 y.o. male patient transferred from [address]   -No acute distress noted.  -No complaints of shortness of breath.  -No complaints of chest pain.    Blood pressure 163/65, pulse 70, temperature 98.3 F (36.8 C), temperature source Oral, resp. rate 16, height  (1.651 m), weight 88.2 kg (194 lb 7.1 oz), SpO2 98 %.   IV Fluids: Picc line noted  Allergies:  Morphine and related  Past Medical History:   has a past medical history of Arteriosclerotic cardiovascular disease (ASCVD); Diabetes mellitus type II; Hyperlipidemia; Degenerative joint disease; Cerebrovascular disease (2003); Peripheral vascular disease (2007); Anemia; Peripheral neuropathy; Hypertension; Sick sinus syndrome (1996); Chronic obstructive pulmonary disease; Obesity; Obstructive sleep apnea; Benign prostatic hypertrophy; Gastroesophageal reflux disease; and Tobacco abuse, in remission.  Past Surgical History:   has past surgical history that includes Coronary artery bypass graft (2003); Iliac artery stent; Pacemaker insertion (1996); Carotid endarterectomy (2003); Pacemaker insertion; Aorta - bilateral femoral artery bypass graft (2007); Femoral-popliteal Bypass Graft (1998); Colonoscopy (2012); ileocolonoscopy (02/20/2007); Femoral-femoral Bypass Graft (Bilateral, 01/03/2014); Endarterectomy femoral (Left, 01/03/2014); Intraoperative arteriogram (Left, 01/03/2014); abdominal angiogram (N/A, 01/02/2014); Cardiac catheterization (01/02/2014); arch aortogram (01/02/2014); and Wound exploration (Right, 02/15/2014).  Social History:   reports that he quit smoking about 9 years ago. His smoking use included Cigarettes. He has a 50 pack-year smoking history. He quit smokeless tobacco use about 18 years ago. He reports that he does not drink  alcohol or use illicit drugs.  Skin: Intact, may refer to documentation about Right groin area.   Patient/Family orientated to room. Information packet given to patient/family. Admission inpatient armband information verified with patient/family to include name and date of birth and placed on patient arm. Side rails up x 2, fall assessment and education completed with patient/family. Patient/family able to verbalize understanding of risk associated with falls and verbalized understanding to call for assistance before getting out of bed. Call light within reach. Patient/family able to voice and demonstrate understanding of unit orientation instructions.    Will continue to evaluate and treat per MD orders.

## 2014-02-17 NOTE — Progress Notes (Signed)
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 NO CP      CURRENT MEDS . sodium chloride   Intravenous Once  . antiseptic oral rinse  7 mL Mouth Rinse BID  . aspirin  81 mg Oral Daily  . atorvastatin  80 mg Oral q1800  . ceFEPime (MAXIPIME) IV  1 g Intravenous Q12H  . ferrous sulfate  325 mg Oral BID  . gabapentin  300 mg Oral TID  . insulin aspart  0-20 Units Subcutaneous TID WC  . insulin aspart  0-5 Units Subcutaneous QHS  . insulin detemir  50 Units Subcutaneous Q2200  . isosorbide mononitrate  30 mg Oral Daily  . metoprolol  25 mg Oral BID  . sodium chloride  10-40 mL Intracatheter Q12H  . sodium chloride  3 mL Intravenous Q12H  . triamcinolone cream  1 application Topical BID  . vancomycin  750 mg Intravenous Q12H    OBJECTIVE  Filed Vitals:   02/16/14 1631 02/16/14 1945 02/16/14 2314 02/17/14 0425  BP:  152/72 142/76 171/79  Pulse:  71 78 70  Temp: 98.9 F (37.2 C) 97.9 F (36.6 C) 99 F (37.2 C) 99.2 F (37.3 C)  TempSrc: Oral Oral Oral Oral  Resp:  24 14 24   Height:      Weight:      SpO2:  97% 100% 94%    Intake/Output Summary (Last 24 hours) at 02/17/14 0800 Last data filed at 02/17/14 0416  Gross per 24 hour  Intake    680 ml  Output    925 ml  Net   -245 ml   Filed Weights   02/11/14 1328 02/11/14 2059 02/12/14 0535  Weight: 198 lb (89.812 kg) 198 lb 6.6 oz (90 kg) 194 lb 7.1 oz (88.2 kg)    PHYSICAL EXAM  General: Alert, awake,  oriented x3, in no acute distress.  HEENT: No bruits, no goiter. Negative JVD Heart: Regular rate and rhythm.  RRR no S3  No S4   Lungs: Good air movement, crackles on the right Abdomen: Soft, nontender, nondistended, positive bowel sounds.  Skin: dressing of right groin  Tele:  Paced  Occasional PVC   Accessory Clinical Findings  CBC  Recent Labs  02/16/14 0350 02/17/14 0420  WBC 7.7 7.3  HGB 8.9* 9.0*  HCT 29.5* 29.6*  MCV 83.6 84.3  PLT 193 244   Basic Metabolic Panel  Recent Labs  02/15/14 2245 02/16/14 0350 02/17/14 0420  NA  --  136 138  K  --  3.9 3.9  CL  --  101 103  CO2  --  31 29  GLUCOSE  --  171* 178*  BUN  --  19 15  CREATININE  --  1.45* 1.37*  CALCIUM  --  8.5 8.6  MG 1.8  --   --     TELE  A paced. Freq PVCs  Radiology/Studies  Dg Chest 2 View  02/11/2014  CLINICAL DATA:  Fever.  Hypoxia.  EXAM: CHEST  2 VIEW  COMPARISON:  07/15/2005 and 07/10/2005  FINDINGS: Pacemaker in place. Previous median sternotomy. There is slight pulmonary vascular congestion but overall heart size is normal. The hilar structures are prominent, right more than left but I feel this is due to the vascular congestion. No effusions. No acute osseous abnormality.  IMPRESSION: Slight pulmonary vascular congestion.   Electronically Signed   By: Geanie [name] M.D.   On: 02/11/2014 15:53   Ct Angio Chest Pe W/cm &/or Wo Cm  02/11/2014   CLINICAL DATA:  Short of breath and hypoxia.  Chills and fever  EXAM: CT ANGIOGRAPHY CHEST WITH CONTRAST  TECHNIQUE: Multidetector CT imaging of the chest was performed using the standard protocol during bolus administration of intravenous contrast. Multiplanar CT image reconstructions and MIPs were obtained to evaluate the vascular anatomy.  CONTRAST:  OMNIPAQUE IOHEXOL 350 MG/ML SOLN  COMPARISON:  None.  FINDINGS: Mediastinum: There is moderate cardiac enlargement. The patient is status post median sternotomy and CABG procedure. No  pericardial effusion identified. The trachea appears patent and is midline. Normal appearance of the esophagus. Prominent AP window lymph node measures 1.4 cm, image number 35/series 6. The main pulmonary artery appears patent. No lobar or segmental pulmonary artery filling defects to suggest a clinically significant pulmonary embolus.  Lungs/Pleura: No airspace consolidation. Mild lower lobe predominant dependent changes are identified including subsegmental atelectasis.  Upper Abdomen: The visualized portions of the liver and spleen are normal. The adrenal glands are unremarkable. The visualized portions of the gallbladder are also normal.  Musculoskeletal: Mild multi level degenerative disc disease identified within the thoracic spine. There is a curvature of the thoracic spine which is convex towards the right.  Review of the MIP images confirms the above findings.  IMPRESSION: 1. No evidence for acute pulmonary embolus. 2. Cardiac enlargement status post CABG procedure.   Electronically Signed   By: Signa Kell M.D.   On: 02/11/2014 17:21   US Pelvis Limited  02/12/2014   CLINICAL DATA:  Fever. Evaluate for abscess. Palpable abnormality right inguinal region.  EXAM: LIMITED ULTRASOUND OF PELVIS  TECHNIQUE: Limited transabdominal ultrasound examination of the pelvis was performed.  COMPARISON:  None.  FINDINGS: A large 11.4 x 10.0 x 4.6 cm right groin complex fluid collection is noted. This would be consistent with a abscess. To exclude other pathology including hernia, CT can be obtained if needed.  IMPRESSION: Large 11.4 x 10.0 x 4.6 cm right groin complex fluid collection noted. This would be consistent with an abscess. Further evaluation with CT can be obtained if needed .   Electronically Signed   By: Maisie Fus  Register   On: 02/12/2014 10:13   Dg Chest Port 1 View  02/12/2014   CLINICAL DATA:  Status post PICC line placement.  EXAM: PORTABLE CHEST - 1 VIEW  COMPARISON:  February 11, 2014.   FINDINGS: Stable cardiomediastinal silhouette. Sternotomy wires are noted. Stable mild central pulmonary vascular congestion. Right-sided pacemaker is unchanged in position. No pneumothorax or pleural effusion is noted. Interval placement of left-sided PICC line with distal tip overlying expected position of the SVC.  IMPRESSION: Interval placement of left-sided PICC line with distal tip overlying expected position of the SVC.   Electronically Signed   By: Roque Lias M.D.   On: 02/12/2014 14:52   Ct Angio Abd/pel W/ And/or W/o  02/14/2014   CLINICAL DATA:  Complex fluid collection of the right groin  and status post prior aortobifemoral bypass grafting in 2007 with prior revision of the mid pole right graft limb  EXAM: CTA ABDOMEN AND PELVIS WITHOUT CONTRAST  TECHNIQUE: Multidetector CT imaging of the abdomen and pelvis was performed using the standard protocol during bolus administration of intravenous contrast. Multiplanar reconstructed images and MIPs were obtained and reviewed to evaluate the vascular anatomy.  CONTRAST:  80mL OMNIPAQUE IOHEXOL 350 MG/ML SOLN  COMPARISON:  Right groin ultrasound on 02/12/2014  FINDINGS: Complex fluid collection of the right inguinal region present with 3 separate components that likely communicate with 1 another. Superior component is associated with the inferior abdominal wall and measures approximately 5.5 cm in diameter. Surrounding inflammation present. The deep portion of this fluid collection abuts the distal right limb of an aortobifemoral graft without evidence of graft pseudoaneurysm or erosion. No air is identified adjacent to the graft or in the fluid collection  Communicating second pocket of fluid extends towards the lower inguinal region and measures approximately 5 cm in diameter. There is a soft tissue wound just superficial to the second component of loculated fluid. The wound itself does not appear to extend all the way to the level of fluid. Third  communicating pocket extends into the right inguinal canal and measures approximately 6 cm in greatest diameter. In the coronal projection, all of the fluid collections at up to roughly 12 cm in maximal diameter. Findings are likely consistent with multi loculated abscess given constellation of CT and ultrasound findings.  The aortobifemoral graft itself shows normal patency. Both native SFA origins appear to likely be occluded chronically. Native abdominal aorta above the graft shows no evidence of aneurysmal disease or significant stenosis. There is approximately 80% narrowing at the origin of the celiac axis. The superior mesenteric artery trunk shows diffuse and significant atherosclerosis. Bilateral renal arteries are heavily calcified proximally.  The liver shows steatosis. There is reflux of contrast into the intrahepatic IVC and hepatic veins consistent with a component of right heart failure. The visualized lung bases show small bilateral pleural effusions.  Rounded hyperdense cortical lesion of the lateral mid left kidney measures approximately 13 mm in diameter. This represents either a hemorrhagic cyst or enhancing cortical lesion. No abnormalities are seen involving bowel. The bladder appears unremarkable. No bony abnormalities.  Review of the MIP images confirms the above findings.  IMPRESSION: 1. Complex and multi lobulated fluid collection in the right inguinal region with 3 separate components present. These likely communicate with one another and span over a distance of roughly 12 cm. Part of the fluid collection does abut the distal right limb of the aortobifemoral graft. No evidence of gas in the fluid or graft pseudoaneurysm. This most likely represents a complex abscess. Inferior component of fluid extends into the right inguinal canal. 2. Probable underlying right heart failure with small bilateral pleural effusions. 3. 13 mm hyperdense cortical lesion of the mid lateral left renal cortex.  This is incompletely evaluated on the CTA study. This could represent a hyperdense/hemorrhagic cyst or enhancing cortical neoplasm. Followup is recommended. The most definitive evaluation would be MRI of the abdomen when the patient is able to tolerate MRI examination.   Electronically Signed   By: Irish Lack M.D.   On: 02/14/2014 15:41    ASSESSMENT AND PLAN TANDY LEWIN is a 64 y.o. male with a history of COPD, CAD s/p CABG (2003), DM 2, PVD, OSA , SSS s/p pacer who was transferred from [facility] to [facility] on 02/12/14  with acute respiratory failure. He had a recent aorta femoral bypass and was found to have an abscess in his groin s/p I&D of the abscess by Dr. Darrick Penna on 02/15/13. Cardiology was consulted for elevated troponin.  Elevated troponin: likely due to demand ischemia in setting of acute infectious process. He does have diabetes and known CAD.Troponin is trending down 8.01--> 3.07--> 2.31--> 1.32. This was not thought to be due to an acute coronary syndrome.  -- Echo 02/12/14 with normal LV function. Will plan on doing a stress or Lexiscan Myoview when his infectious issues are resolved prior to D/C   -- Continue ASA, statin, beta blocker, Imdur.   Anemia- H/H stale at 9.0/29.6 after 2U PRBCs. Heparin discontinued   Acute kidney injury- creat still a little above baseline at 1.37  Acute respiratory failure- Likely due to sepsis of the groin and demand ischemia. Now resolved  Groin abscess- S/p I&D of groin abscess, cultures from 02/15/2013 showing moderate staph aureus -- Per IM and vascular  Signed, Janetta Hora PA-C  Pager [phone]  Patinet seen and examined  I have amended note above by Philomena Course to reflect my findings.  Patinet currently without CP  Intermitt SOB  With elevation of Trop that he had would recomm a Lexiscan myoview before d/c.   Strict I/O  Check BNP   Dietrich Pates

## 2014-02-17 NOTE — Anesthesia Postprocedure Evaluation (Signed)
  Anesthesia Post-op Note  Patient: [name]  Procedure(s) Performed: Procedure(s): Incision and Drainage Right Groin (Right)  Patient Location: PACU  Anesthesia Type:General  Level of Consciousness: awake and alert   Airway and Oxygen Therapy: Patient Spontanous Breathing  Post-op Pain: mild  Post-op Assessment: Post-op Vital signs reviewed  Post-op Vital Signs: stable  Last Vitals:  Filed Vitals:   02/17/14 0425  BP: 171/79  Pulse: 70  Temp: 37.3 C  Resp: 24    Complications: No apparent anesthesia complications

## 2014-02-17 NOTE — Progress Notes (Addendum)
[facility] PROGRESS NOTE Interim history: 64 year old male with past medical history a recent aorta femoral bypass, who came in with acute hypoxic respiratory failure, sepsis and elevated cardiac enzymes. Started on empiric antibiotics. CT imaging of the Chest was done in ED showed no PE. He also developed acute kidney injury probably due to sepsis, ACE inhibitor and contrast-induced nephropathy which resolved. Groin ultrasound showed abscess, vascular surgery consulted recommended a CT scan of the abdomen and pelvis with contrast emergently on 02/14/2014 which showed multiple loculated abscesses and possible infected native graft, he is  status post I&D and possible graft infection on 02/15/2014. Blood cultures and abscess cultures are pending. He is on IV fluid to decrease the risk of contrast-induced nephropathy. Continue to monitor creatinine. Weight continues to be stable. He'll need an ischemic evaluation as an outpatient. Due to SOB and elevated CVP he was given IV lasix with good respond. And transfuse 1 unit of PRBC.   Filed Weights   02/11/14 1328 02/11/14 2059 02/12/14 0535  Weight: 89.812 kg (198 lb) 90 kg (198 lb 6.6 oz) 88.2 kg (194 lb 7.1 oz)        Intake/Output Summary (Last 24 hours) at 02/17/14 0825 Last data filed at 02/17/14 0416  Gross per 24 hour  Intake    680 ml  Output    925 ml  Net   -245 ml     Assessment/Plan: Acute respiratory failure with hypoxia - Likely due to sepsis of the groin and demand ischemia. - Currently satting 100% on 1 L.  - Improved. Consider transfering the patient to telemetry.  Sepsis? Due groin abscess with possible infected graft: - Ct angio of graft showed multiple abscess and possible infected native graft. Vascular Surgery, onboard. - Empiric vancomycin and cefepime started on 12.30.2015 - Blood cultures negative till date. - S/p I&D of groin abscess, cultures from 02/15/2013 showing moderate staph aureus. Vascular would  like to change the dressing due to the high risk of bleeding. - Plan for surgery today or tomorrow.  AKI (acute kidney injury)/acute toxic nephropathy: - Multifactorial due to sepsis, ACE inhibitor and contrast-induced nephropathy. ACE held. - Mild increase in renal failure.  Elevated Troponin's: - Cont aspirin, and beta blockers. - Cardiology on board, recommended an ischemic workup, timing as per cardiology. - Echo as below  Hyponatremia - Resolved with hydration.  Chronic obstructive pulmonary disease - Stable.  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  Essential hypertension - Increase Imdur. - Blood pressure mildly high.   Normocytic anemia: - Ferritin will be unreliable transfuse 2 units of packed red blood cells. - try to keep Hbg closer to 9 due to the possibility of ischemic heart disease.  Code Status: Full code  DVT Prophylaxis: Heparin Family Communication: I discussed the plan with the patient at the bedside.  Disposition Plan: Home when medically stable.    Consultants:  Cardiology  Vascular Surgery  Procedures: ECHO: EF normal no wall motion. Pelvic US 12.30.2015: Large 11.4 x 10.0 x 4.6 cm right groin complex fluid collection noted  Antibiotics:  Vanco and cefepime 02/11/2014  HPI/Subjective: Complaining of cough. Otherwise no other complaints.  Objective: Filed Vitals:   02/16/14 1945 02/16/14 2314 02/17/14 0425 02/17/14 0735  BP: 152/72 142/76 171/79 158/81  Pulse: 71 78 70 70  Temp: 97.9 F (36.6 C) 99 F (37.2 C) 99.2 F (37.3 C)   TempSrc:  Oral Oral Oral   Resp: Height:      Weight:      SpO2: 97% 100% 94% 94%     Exam:  General: Alert, awake, oriented x3, in no acute distress.  HEENT: No bruits, no goiter. Negative JVD Heart: Regular rate and rhythm. Lungs: Good air movement, crackles on the  right Abdomen: Soft, nontender, nondistended, positive bowel sounds.  Skin: dressing of right groin   Data Reviewed: Basic Metabolic Panel:  Recent Labs Lab 02/13/14 0758 02/14/14 0410 02/15/14 1415 02/15/14 2245 02/16/14 0350 02/17/14 0420  NA 134* 133* 136  --  136 138  K 4.2 4.2 4.2  --  3.9 3.9  CL 104 104 104  --  101 103  CO2 --  31 29  GLUCOSE 131* 97 115*  --  171* 178*  BUN 24* 23 17  --  19 15  CREATININE 1.89* 1.71* 1.30  --  1.45* 1.37*  CALCIUM 8.0* 7.9* 8.3*  --  8.5 8.6  MG  --   --   --  1.8  --   --    Liver Function Tests:  Recent Labs Lab 02/10/14 1206 02/11/14 1429 02/12/14 0155  AST 14 15 34  ALT ALKPHOS 63 61 50  BILITOT 0.7 1.1 1.2  PROT 6.9 7.7 6.2  ALBUMIN 3.6 3.6 2.9*   No results for input(s): LIPASE, AMYLASE in the last 168 hours. No results for input(s): AMMONIA in the last 168 hours. CBC:  Recent Labs Lab 02/10/14 1206 02/11/14 1429  02/13/14 0720 02/14/14 0410 02/15/14 0444 02/16/14 0350 02/17/14 0420  WBC 5.4 17.0*  < > 15.4* 10.7* 10.1 7.7 7.3  NEUTROABS 3.0 15.3*  --   --   --   --   --   --   HGB 10.7* 10.9*  < > 8.9* 7.8* 7.8* 8.9* 9.0*  HCT 34.0* 35.0*  < > 28.6* 25.5* 24.9* 29.5* 29.6*  MCV 84.6 87.3  < > 85.4 84.7 86.5 83.6 84.3  PLT 297 293  < > 207 189 179 193 244  < > = values in this interval not displayed. Cardiac Enzymes:  Recent Labs Lab 02/12/14 0155 02/12/14 1045 02/12/14 1532 02/13/14 0005  TROPONINI 8.01* 3.07* 2.31* 1.32*   BNP (last 3 results) No results for input(s): PROBNP in the last 8760 hours. CBG:  Recent Labs Lab 02/15/14 2135 02/16/14 0823 02/16/14 1221 02/16/14 1614 02/16/14 2114  GLUCAP 164* 104* 120* 135* 189*    Recent Results (from the past 240 hour(s))  Blood culture (routine x 2)     Status: None   Collection Time: 02/11/14  2:29 PM  Result Value Ref Range Status   Specimen Description BLOOD RIGHT HAND  Final   Special Requests BOTTLES DRAWN  AEROBIC AND ANAEROBIC 4CC  Final   Culture NO GROWTH 5 DAYS  Final   Report Status 02/16/2014 FINAL  Final  Blood culture (routine x 2)     Status: None   Collection Time: 02/11/14  2:33 PM  Result Value Ref Range Status   Specimen Description BLOOD RIGHT ANTECUBITAL  Final   Special Requests BOTTLES DRAWN AEROBIC ONLY 6CC  Final   Culture NO GROWTH 5 DAYS  Final   Report Status 02/16/2014 FINAL  Final  Urine culture     Status: None   Collection Time: 02/11/14  2:56 PM  Result Value Ref Range Status  Specimen Description URINE, CLEAN CATCH  Final   Special Requests NONE  Final   Colony Count NO GROWTH Performed at [facility]   Final   Culture NO GROWTH Performed at [facility]   Final   Report Status 02/12/2014 FINAL  Final  MRSA PCR Screening     Status: None   Collection Time: 02/11/14  6:40 PM  Result Value Ref Range Status   MRSA by PCR NEGATIVE NEGATIVE Final    Comment:        The GeneXpert MRSA Assay (FDA approved for NASAL specimens only), is one component of a comprehensive MRSA colonization surveillance program. It is not intended to diagnose MRSA infection nor to guide or monitor treatment for MRSA infections.   Wound culture     Status: None   Collection Time: 02/14/14 10:08 AM  Result Value Ref Range Status   Specimen Description WOUND GROIN RIGHT  Final   Special Requests NONE  Final   Gram Stain   Final    NO WBC SEEN NO SQUAMOUS EPITHELIAL CELLS SEEN NO ORGANISMS SEEN Performed at [facility]    Culture   Final    NO GROWTH 3 DAYS Performed at [facility]    Report Status 02/17/2014 FINAL  Final  Body fluid culture     Status: None (Preliminary result)   Collection Time: 02/15/14  9:49 AM  Result Value Ref Range Status   Specimen Description FLUID RIGHT DRAINAGE GROIN  Final   Special Requests NONE  Final   Gram Stain   Final    ABUNDANT WBC PRESENT, PREDOMINANTLY PMN RARE GRAM POSITIVE COCCI IN  PAIRS Performed at [facility]    Culture   Final    MODERATE STAPHYLOCOCCUS AUREUS Note: RIFAMPIN AND GENTAMICIN SHOULD NOT BE USED AS SINGLE DRUGS FOR TREATMENT OF STAPH INFECTIONS. Performed at [facility]    Report Status PENDING  Incomplete     Studies: No results found.  Scheduled Meds: . sodium chloride   Intravenous Once  . antiseptic oral rinse  7 mL Mouth Rinse BID  . aspirin  81 mg Oral Daily  . atorvastatin  80 mg Oral q1800  . ceFEPime (MAXIPIME) IV  1 g Intravenous Q12H  . ferrous sulfate  325 mg Oral BID  . gabapentin  300 mg Oral TID  . insulin aspart  0-20 Units Subcutaneous TID WC  . insulin aspart  0-5 Units Subcutaneous QHS  . insulin detemir  50 Units Subcutaneous Q2200  . isosorbide mononitrate  30 mg Oral Daily  . metoprolol  25 mg Oral BID  . sodium chloride  10-40 mL Intracatheter Q12H  . sodium chloride  3 mL Intravenous Q12H  . triamcinolone cream  1 application Topical BID  . vancomycin  750 mg Intravenous Q12H   Continuous Infusions:     Sheena Simonis A, MD  [facility] If 7PM-7AM, please contact night-coverage at [email or web address], password [facility] 02/17/2014, 8:25 AM  LOS: 6 days

## 2014-02-18 ENCOUNTER — Encounter (HOSPITAL_COMMUNITY): Payer: Self-pay | Admitting: Physician Assistant

## 2014-02-18 DIAGNOSIS — R7989 Other specified abnormal findings of blood chemistry: Secondary | ICD-10-CM

## 2014-02-18 DIAGNOSIS — I10 Essential (primary) hypertension: Secondary | ICD-10-CM

## 2014-02-18 DIAGNOSIS — J9601 Acute respiratory failure with hypoxia: Secondary | ICD-10-CM

## 2014-02-18 LAB — GLUCOSE, CAPILLARY
GLUCOSE-CAPILLARY: 183 mg/dL — AB (ref 70–99)
Glucose-Capillary: 126 mg/dL — ABNORMAL HIGH (ref 70–99)
Glucose-Capillary: 139 mg/dL — ABNORMAL HIGH (ref 70–99)
Glucose-Capillary: 176 mg/dL — ABNORMAL HIGH (ref 70–99)

## 2014-02-18 LAB — CBC
HCT: 30.1 % — ABNORMAL LOW (ref 39.0–52.0)
HEMOGLOBIN: 9.4 g/dL — AB (ref 13.0–17.0)
MCH: 26.9 pg (ref 26.0–34.0)
MCHC: 31.2 g/dL (ref 30.0–36.0)
MCV: 86 fL (ref 78.0–100.0)
Platelets: 325 10*3/uL (ref 150–400)
RBC: 3.5 MIL/uL — AB (ref 4.22–5.81)
RDW: 14.3 % (ref 11.5–15.5)
WBC: 7.9 10*3/uL (ref 4.0–10.5)

## 2014-02-18 LAB — PREALBUMIN
Prealbumin: 10.6 mg/dL — ABNORMAL LOW (ref 17.0–34.0)
Prealbumin: 11.7 mg/dL — ABNORMAL LOW (ref 17.0–34.0)

## 2014-02-18 LAB — CREATININE, SERUM
Creatinine, Ser: 1.21 mg/dL (ref 0.50–1.35)
GFR calc Af Amer: 71 mL/min — ABNORMAL LOW (ref >90)
GFR calc non Af Amer: 62 mL/min — ABNORMAL LOW (ref >90)

## 2014-02-18 MED ORDER — HYDRALAZINE HCL 25 MG PO TABS
25.0000 mg | ORAL_TABLET | Freq: Three times a day (TID) | ORAL | Status: DC
Start: 2014-02-18 — End: 2014-02-22
  Administered 2014-02-18 – 2014-02-22 (×11): 25 mg via ORAL
  Filled 2014-02-18 (×17): qty 1

## 2014-02-18 MED ORDER — CLONIDINE HCL 0.1 MG/24HR TD PTWK
0.1000 mg | MEDICATED_PATCH | TRANSDERMAL | Status: DC
  Administered 2014-02-18 – 2014-03-04 (×3): 0.1 mg via TRANSDERMAL
  Filled 2014-02-18 (×3): qty 1

## 2014-02-18 NOTE — Progress Notes (Signed)
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 Admitted 12/29 with sepsis, acute resp failure (no intubation), groin abscesses and elevated troponin up to 8.01. S/p I&D, will need surgical repair. Cards following for elevated troponin and pt will need clearance for upcoming surgery.  SUBJECTIVE: No chest pain, no SOB, poor activity level since his surgery in November.  OBJECTIVE Filed Vitals:   02/18/14 0346 02/18/14 0621 02/18/14 0927 02/18/14 1100  BP: 159/73 175/66 174/82 141/51  Pulse:  70 70   Temp:  98.4 F (36.9 C)    TempSrc:  Oral    Resp:  15    Height:      Weight:      SpO2:  95%      Intake/Output Summary (Last 24 hours) at 02/18/14 1110 Last data filed at 02/18/14 1029  Gross per 24 hour  Intake   1790 ml  Output   1850 ml  Net    -60 ml   Filed Weights   02/11/14 1328 02/11/14 2059 02/12/14 0535  Weight: 198 lb (89.812 kg) 198 lb 6.6 oz (90 kg) 194 lb 7.1 oz (88.2 kg)    PHYSICAL EXAM General: Well developed, well nourished, male in no acute distress. Head: Normocephalic, atraumatic.  Neck: Supple without bruits, JVD minimal elevation. Lungs:  Resp regular and unlabored, rales bilaterally. Heart: RRR, S1, S2, no S3, S4, soft murmur; no rub. Abdomen: Soft, non-tender, non-distended, BS + x 4.  Extremities: No clubbing, cyanosis, no  edema. Wounds not disturbed Neuro: Alert and oriented X 3. Moves all extremities spontaneously. Psych: Normal affect.  LABS: CBC: Recent Labs  02/17/14 0420 02/18/14 0450  WBC 7.3 7.9  HGB 9.0* 9.4*  HCT 29.6* 30.1*  MCV 84.3 86.0  PLT 244 325   Basic Metabolic Panel: Recent Labs  02/15/14 2245 02/16/14 0350 02/17/14 0420 02/18/14 0450  NA  --  136 138  --   K  --  3.9 3.9  --   CL  --  101 103  --   CO2  --  31 29  --   GLUCOSE  --  171* 178*  --   BUN  --  19 15  --   CREATININE  --  1.45* 1.37* 1.21  CALCIUM  --  8.5 8.6  --   MG 1.8  --   --   --     TELE:   Dg Chest 2 View 02/11/2014   CLINICAL DATA:  Fever.  Hypoxia.  EXAM: CHEST  2 VIEW  COMPARISON:  07/15/2005 and 07/10/2005  FINDINGS: Pacemaker in place. Previous median sternotomy. There is slight pulmonary vascular congestion but overall heart size is normal. The hilar structures are prominent, right more than left but I feel this is due to the vascular congestion. No effusions. No acute osseous abnormality.  IMPRESSION: Slight  pulmonary vascular congestion.   Electronically Signed   By: Geanie [name] M.D.   On: 02/11/2014 15:53   Ct Angio Chest Pe W/cm &/or Wo Cm 02/11/2014   CLINICAL DATA:  Short of breath and hypoxia.  Chills and fever  EXAM: CT ANGIOGRAPHY CHEST WITH CONTRAST  TECHNIQUE: Multidetector CT imaging of the chest was performed using the standard protocol during bolus administration of intravenous contrast. Multiplanar CT image reconstructions and MIPs were obtained to evaluate the vascular anatomy.  CONTRAST:  OMNIPAQUE IOHEXOL 350 MG/ML SOLN  COMPARISON:  None.  FINDINGS: Mediastinum: There is moderate cardiac enlargement. The patient is status post median sternotomy and CABG procedure. No pericardial effusion identified. The trachea appears patent and is midline. Normal appearance of the esophagus. Prominent AP window lymph node measures 1.4 cm, image number 35/series 6. The main pulmonary  artery appears patent. No lobar or segmental pulmonary artery filling defects to suggest a clinically significant pulmonary embolus.  Lungs/Pleura: No airspace consolidation. Mild lower lobe predominant dependent changes are identified including subsegmental atelectasis.  Upper Abdomen: The visualized portions of the liver and spleen are normal. The adrenal glands are unremarkable. The visualized portions of the gallbladder are also normal.  Musculoskeletal: Mild multi level degenerative disc disease identified within the thoracic spine. There is a curvature of the thoracic spine which is convex towards the right.  Review of the MIP images confirms the above findings.  IMPRESSION: 1. No evidence for acute pulmonary embolus. 2. Cardiac enlargement status post CABG procedure.   Electronically Signed   By: Signa Kell M.D.   On: 02/11/2014 17:21   Dg Chest Port 1 View 02/12/2014   CLINICAL DATA:  Status post PICC line placement.  EXAM: PORTABLE CHEST - 1 VIEW  COMPARISON:  February 11, 2014.  FINDINGS: Stable cardiomediastinal silhouette. Sternotomy wires are noted. Stable mild central pulmonary vascular congestion. Right-sided pacemaker is unchanged in position. No pneumothorax or pleural effusion is noted. Interval placement of left-sided PICC line with distal tip overlying expected position of the SVC.  IMPRESSION: Interval placement of left-sided PICC line with distal tip overlying expected position of the SVC.   Electronically Signed   By: Roque Lias M.D.   On: 02/12/2014 14:52   Ct Angio Abd/pel W/ And/or W/o 02/14/2014   CLINICAL DATA:  Complex fluid collection of the right groin and status post prior aortobifemoral bypass grafting in 2007 with prior revision of the mid pole right graft limb  EXAM: CTA ABDOMEN AND PELVIS WITHOUT CONTRAST  TECHNIQUE: Multidetector CT imaging of the abdomen and pelvis was performed using the standard protocol during bolus administration of intravenous contrast.  Multiplanar reconstructed images and MIPs were obtained and reviewed to evaluate the vascular anatomy.  CONTRAST:  80mL OMNIPAQUE IOHEXOL 350 MG/ML SOLN  COMPARISON:  Right groin ultrasound on 02/12/2014  FINDINGS: Complex fluid collection of the right inguinal region present with 3 separate components that likely communicate with 1 another. Superior component is associated with the inferior abdominal wall and measures approximately 5.5 cm in diameter. Surrounding inflammation present. The deep portion of this fluid collection abuts the distal right limb of an aortobifemoral graft without evidence of graft pseudoaneurysm or erosion. No air is identified adjacent to the graft or in the fluid collection  Communicating second pocket of fluid extends towards the lower inguinal region and measures approximately 5 cm in diameter. There is a soft tissue wound just superficial to the second component of loculated fluid. The wound itself does  not appear to extend all the way to the level of fluid. Third communicating pocket extends into the right inguinal canal and measures approximately 6 cm in greatest diameter. In the coronal projection, all of the fluid collections at up to roughly 12 cm in maximal diameter. Findings are likely consistent with multi loculated abscess given constellation of CT and ultrasound findings.  The aortobifemoral graft itself shows normal patency. Both native SFA origins appear to likely be occluded chronically. Native abdominal aorta above the graft shows no evidence of aneurysmal disease or significant stenosis. There is approximately 80% narrowing at the origin of the celiac axis. The superior mesenteric artery trunk shows diffuse and significant atherosclerosis. Bilateral renal arteries are heavily calcified proximally.  The liver shows steatosis. There is reflux of contrast into the intrahepatic IVC and hepatic veins consistent with a component of right heart failure. The visualized lung  bases show small bilateral pleural effusions.  Rounded hyperdense cortical lesion of the lateral mid left kidney measures approximately 13 mm in diameter. This represents either a hemorrhagic cyst or enhancing cortical lesion. No abnormalities are seen involving bowel. The bladder appears unremarkable. No bony abnormalities.  Review of the MIP images confirms the above findings.  IMPRESSION: 1. Complex and multi lobulated fluid collection in the right inguinal region with 3 separate components present. These likely communicate with one another and span over a distance of roughly 12 cm. Part of the fluid collection does abut the distal right limb of the aortobifemoral graft. No evidence of gas in the fluid or graft pseudoaneurysm. This most likely represents a complex abscess. Inferior component of fluid extends into the right inguinal canal. 2. Probable underlying right heart failure with small bilateral pleural effusions. 3. 13 mm hyperdense cortical lesion of the mid lateral left renal cortex. This is incompletely evaluated on the CTA study. This could represent a hyperdense/hemorrhagic cyst or enhancing cortical neoplasm. Followup is recommended. The most definitive evaluation would be MRI of the abdomen when the patient is able to tolerate MRI examination.   Electronically Signed   By: Irish Lack M.D.   On: 02/14/2014 15:41    Current Medications:  . sodium chloride   Intravenous Once  . antiseptic oral rinse  7 mL Mouth Rinse BID  . aspirin  81 mg Oral Daily  . atorvastatin  80 mg Oral q1800  . cloNIDine  0.1 mg Transdermal Weekly  . ferrous sulfate  325 mg Oral BID  . gabapentin  300 mg Oral TID  . insulin aspart  0-20 Units Subcutaneous TID WC  . insulin aspart  0-5 Units Subcutaneous QHS  . insulin detemir  50 Units Subcutaneous Q2200  . isosorbide mononitrate  30 mg Oral Daily  . metoprolol  25 mg Oral BID  . sodium chloride  10-40 mL Intracatheter Q12H  . sodium chloride  3 mL  Intravenous Q12H  . triamcinolone cream  1 application Topical BID  . vancomycin  750 mg Intravenous Q12H      ASSESSMENT AND PLAN:   Elevated troponin I level - remote CABG 2003, no cath seen since, last level was 1.32 on 12/31, will need MV in am as preop eval. Cannot do today since he ate breakfast.    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  I have amended note above to reflect my findings  Would add hydralazine to bp regimen. Plan for [employer] [facility] tomorrow.    Dietrich PatesPaula Case Vassell

## 2014-02-18 NOTE — Progress Notes (Signed)
   VASCULAR SURGERY ASSESSMENT & PLAN:  * Appreciate Dr. Maude Lerichehimmappa's help. She notes that gracilis flap would be the best option.   *  Continues Vancomycin. Will need long term antibiotics. Consider ID consult.   * Cardiology following. Will need cardiac clearance for upcoming surgery.   SUBJECTIVE: No specific complaints.  PHYSICAL EXAM: Filed Vitals:   02/17/14 2153 02/18/14 0346 02/18/14 0621 02/18/14 0927  BP: 170/58 159/73 175/66 174/82  Pulse: 70  70 70  Temp: 98.5 F (36.9 C)  98.4 F (36.9 C)   TempSrc: Oral  Oral   Resp: 15  15   Height:      Weight:      SpO2: 92%  95%    Wound is clean with good granulation tissue.  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

## 2014-02-18 NOTE — Progress Notes (Addendum)
[facility] PROGRESS NOTE Interim history: 64 year old male with past medical history a recent aorta femoral bypass, who came in with acute hypoxic respiratory failure, sepsis and elevated cardiac enzymes. Started on empiric antibiotics. CT imaging of the Chest was done in ED showed no PE. He also developed acute kidney injury probably due to sepsis, ACE inhibitor and contrast-induced nephropathy which resolved. Groin ultrasound showed abscess, vascular surgery consulted recommended a CT scan of the abdomen and pelvis with contrast emergently on 02/14/2014 which showed multiple loculated abscesses and possible infected native graft, he is  status post I&D and possible graft infection on 02/15/2014. Blood cultures negative but abscess cultures are growing MRSA, continued on vancomycin and cefepime was discontinued.  He will need an ischemic evaluation.  He was transfused 1 unit of PRBC.  Given clinical improvement, he was transferred out of stepdown to telemetry on 02/17/2014.  Plastic surgery was consulted by vascular surgery, anticipate surgery later this week depending on cardiac recommendations.   Filed Weights   02/11/14 1328 02/11/14 2059 02/12/14 0535  Weight: 89.812 kg (198 lb) 90 kg (198 lb 6.6 oz) 88.2 kg (194 lb 7.1 oz)        Intake/Output Summary (Last 24 hours) at 02/18/14 9604 Last data filed at 02/18/14 0300  Gross per 24 hour  Intake   1590 ml  Output   1200 ml  Net    390 ml     Assessment/Plan: Acute respiratory failure with hypoxia - Likely due to sepsis of the groin and demand ischemia.  Resolved.  Sepsis/Due groin abscess with infected graft: - Cultures growing MRSA, continue on vancomycin.  Cefepime discontinued. - Ct angio of graft showed multiple abscess and possible infected native graft. Vascular and Plastic Surgery, onboard.  Anticipate surgery later this week. - Blood cultures negative till date. - S/p I&D of groin abscess, cultures from 02/15/2013  MRSA. - Vascular would like to change the dressing due to the high risk of bleeding.  AKI (acute kidney injury)/acute toxic nephropathy: - Multifactorial due to sepsis, ACE inhibitor and contrast-induced nephropathy. ACE held. - Mild increase in renal failure, improved.  Elevated Troponin's: - Cont aspirin, and beta blockers. - Cardiology on board, recommended an ischemic workup, timing as per cardiology. - Echo as below  Hyponatremia - Resolved with hydration.  Chronic obstructive pulmonary disease - Stable.  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  Essential hypertension - Not well controlled, restarted patient's clonidine patch.   Normocytic anemia: - Ferritin will be unreliable transfuse 2 units of packed red blood cells. - try to keep Hbg closer to 9 due to the possibility of ischemic heart disease.  Code Status: Full code  DVT Prophylaxis: Heparin Family Communication: I discussed the plan with the patient at the bedside.  Disposition Plan: Home when medically stable.    Consultants:  Cardiology  Vascular Surgery  Plastic Surgery  Procedures: ECHO: EF normal no wall motion. Pelvic US 12.30.2015: Large 11.4 x 10.0 x 4.6 cm right groin complex fluid collection noted  Antibiotics:  Vancomycin 02/11/2014   Cefepime 02/11/2014 -> 02/17/2013.  HPI/Subjective: No specific complaints.  Pain under control.  Objective: Filed Vitals:   02/17/14 2153 02/18/14 0346 02/18/14 0621 02/18/14 0927  BP: 170/58 159/73 175/66 174/82  Pulse: 70  70 70  Temp: 98.5 F (36.9 C)  98.4 F (36.9 C)   TempSrc: Oral  Oral  Resp: 15  15   Height:      Weight:      SpO2: 92%  95%      Exam:  Physical Exam: General: Awake, Oriented, No acute distress. HEENT: EOMI. Neck: Supple CV: S1 and S2 Lungs: Clear to ascultation bilaterally Abdomen: Soft, Nontender,  Nondistended, +bowel sounds. Ext: Good pulses. Trace edema. Skin: dressing of right groin   Data Reviewed: Basic Metabolic Panel:  Recent Labs Lab 02/13/14 0758 02/14/14 0410 02/15/14 1415 02/15/14 2245 02/16/14 0350 02/17/14 0420 02/18/14 0450  NA 134* 133* 136  --  136 138  --   K 4.2 4.2 4.2  --  3.9 3.9  --   CL 104 104 104  --  101 103  --   CO2 25 23 25   --  31 29  --   GLUCOSE 131* 97 115*  --  171* 178*  --   BUN 24* 23 17  --  19 15  --   CREATININE 1.89* 1.71* 1.30  --  1.45* 1.37* 1.21  CALCIUM 8.0* 7.9* 8.3*  --  8.5 8.6  --   MG  --   --   --  1.8  --   --   --    Liver Function Tests:  Recent Labs Lab 02/11/14 1429 02/12/14 0155  AST 15 34  ALT 15 16  ALKPHOS 61 50  BILITOT 1.1 1.2  PROT 7.7 6.2  ALBUMIN 3.6 2.9*   No results for input(s): LIPASE, AMYLASE in the last 168 hours. No results for input(s): AMMONIA in the last 168 hours. CBC:  Recent Labs Lab 02/11/14 1429  02/14/14 0410 02/15/14 0444 02/16/14 0350 02/17/14 0420 02/18/14 0450  WBC 17.0*  < > 10.7* 10.1 7.7 7.3 7.9  NEUTROABS 15.3*  --   --   --   --   --   --   HGB 10.9*  < > 7.8* 7.8* 8.9* 9.0* 9.4*  HCT 35.0*  < > 25.5* 24.9* 29.5* 29.6* 30.1*  MCV 87.3  < > 84.7 86.5 83.6 84.3 86.0  PLT 293  < > 189 179 193 244 325  < > = values in this interval not displayed. Cardiac Enzymes:  Recent Labs Lab 02/12/14 0155 02/12/14 1045 02/12/14 1532 02/13/14 0005  TROPONINI 8.01* 3.07* 2.31* 1.32*   BNP (last 3 results) No results for input(s): PROBNP in the last 8760 hours. CBG:  Recent Labs Lab 02/16/14 2114 02/17/14 0734 02/17/14 1145 02/17/14 2157 02/18/14 0746  GLUCAP 189* 145* 145* 218* 126*    Recent Results (from the past 240 hour(s))  Blood culture (routine x 2)     Status: None   Collection Time: 02/11/14  2:29 PM  Result Value Ref Range Status   Specimen Description BLOOD RIGHT HAND  Final   Special Requests BOTTLES DRAWN AEROBIC AND ANAEROBIC 4CC  Final     Culture NO GROWTH 5 DAYS  Final   Report Status 02/16/2014 FINAL  Final  Blood culture (routine x 2)     Status: None   Collection Time: 02/11/14  2:33 PM  Result Value Ref Range Status   Specimen Description BLOOD RIGHT ANTECUBITAL  Final   Special Requests BOTTLES DRAWN AEROBIC ONLY 6CC  Final   Culture NO GROWTH 5 DAYS  Final   Report Status 02/16/2014 FINAL  Final  Urine culture     Status: None   Collection Time: 02/11/14  2:56 PM  Result Value Ref Range Status  Specimen Description URINE, CLEAN CATCH  Final   Special Requests NONE  Final   Colony Count NO GROWTH Performed at [facility]   Final   Culture NO GROWTH Performed at [facility]   Final   Report Status 02/12/2014 FINAL  Final  MRSA PCR Screening     Status: None   Collection Time: 02/11/14  6:40 PM  Result Value Ref Range Status   MRSA by PCR NEGATIVE NEGATIVE Final    Comment:        The GeneXpert MRSA Assay (FDA approved for NASAL specimens only), is one component of a comprehensive MRSA colonization surveillance program. It is not intended to diagnose MRSA infection nor to guide or monitor treatment for MRSA infections.   Wound culture     Status: None   Collection Time: 02/14/14 10:08 AM  Result Value Ref Range Status   Specimen Description WOUND GROIN RIGHT  Final   Special Requests NONE  Final   Gram Stain   Final    NO WBC SEEN NO SQUAMOUS EPITHELIAL CELLS SEEN NO ORGANISMS SEEN Performed at [facility]    Culture   Final    NO GROWTH 3 DAYS Performed at [facility]    Report Status 02/17/2014 FINAL  Final  Body fluid culture     Status: None   Collection Time: 02/15/14  9:49 AM  Result Value Ref Range Status   Specimen Description FLUID RIGHT DRAINAGE GROIN  Final   Special Requests NONE  Final   Gram Stain   Final    ABUNDANT WBC PRESENT, PREDOMINANTLY PMN RARE GRAM POSITIVE COCCI IN PAIRS Performed at [facility]    Culture   Final     MODERATE METHICILLIN RESISTANT STAPHYLOCOCCUS AUREUS Note: RIFAMPIN AND GENTAMICIN SHOULD NOT BE USED AS SINGLE DRUGS FOR TREATMENT OF STAPH INFECTIONS. This organism DOES NOT demonstrate inducible Clindamycin resistance in vitro. CRITICAL RESULT CALLED TO, READ BACK BY AND VERIFIED WITH: CAROL@8 :58AM ON  02/17/13 BY DANTS Performed at [facility]    Report Status 02/17/2014 FINAL  Final   Organism ID, Bacteria METHICILLIN RESISTANT STAPHYLOCOCCUS AUREUS  Final      Susceptibility   Methicillin resistant staphylococcus aureus - MIC*    CLINDAMYCIN <=0.25 SENSITIVE Sensitive     ERYTHROMYCIN >=8 RESISTANT Resistant     GENTAMICIN <=0.5 SENSITIVE Sensitive     LEVOFLOXACIN >=8 RESISTANT Resistant     OXACILLIN >=4 RESISTANT Resistant     PENICILLIN >=0.5 RESISTANT Resistant     RIFAMPIN <=0.5 SENSITIVE Sensitive     TRIMETH/SULFA >=320 RESISTANT Resistant     VANCOMYCIN 1 SENSITIVE Sensitive     TETRACYCLINE <=1 SENSITIVE Sensitive     * MODERATE METHICILLIN RESISTANT STAPHYLOCOCCUS AUREUS     Studies: No results found.  Scheduled Meds: . sodium chloride   Intravenous Once  . antiseptic oral rinse  7 mL Mouth Rinse BID  . aspirin  81 mg Oral Daily  . atorvastatin  80 mg Oral q1800  . cloNIDine  0.1 mg Transdermal Weekly  . ferrous sulfate  325 mg Oral BID  . gabapentin  300 mg Oral TID  . insulin aspart  0-20 Units Subcutaneous TID WC  . insulin aspart  0-5 Units Subcutaneous QHS  . insulin detemir  50 Units Subcutaneous Q2200  . isosorbide mononitrate  30 mg Oral Daily  . metoprolol  25 mg Oral BID  . sodium chloride  10-40 mL Intracatheter Q12H  .  sodium chloride  3 mL Intravenous Q12H  . triamcinolone cream  1 application Topical BID  . vancomycin  750 mg Intravenous Q12H   Continuous Infusions:     Eulas Schweitzer A, MD  [facility] If 7PM-7AM, please contact night-coverage at [email or web address], password [facility] 02/18/2014, 9:42 AM  LOS: 7 days

## 2014-02-19 ENCOUNTER — Inpatient Hospital Stay (HOSPITAL_COMMUNITY): Payer: Medicare HMO

## 2014-02-19 ENCOUNTER — Encounter: Payer: Self-pay | Admitting: Internal Medicine

## 2014-02-19 DIAGNOSIS — R079 Chest pain, unspecified: Secondary | ICD-10-CM

## 2014-02-19 LAB — BASIC METABOLIC PANEL
Anion gap: 4 — ABNORMAL LOW (ref 5–15)
BUN: 9 mg/dL (ref 6–23)
CALCIUM: 8.6 mg/dL (ref 8.4–10.5)
CHLORIDE: 101 meq/L (ref 96–112)
CO2: 32 mmol/L (ref 19–32)
Creatinine, Ser: 1.21 mg/dL (ref 0.50–1.35)
GFR calc Af Amer: 71 mL/min — ABNORMAL LOW (ref >90)
GFR, EST NON AFRICAN AMERICAN: 62 mL/min — AB (ref >90)
Glucose, Bld: 150 mg/dL — ABNORMAL HIGH (ref 70–99)
Potassium: 3.8 mmol/L (ref 3.5–5.1)
SODIUM: 137 mmol/L (ref 135–145)

## 2014-02-19 LAB — GLUCOSE, CAPILLARY
GLUCOSE-CAPILLARY: 123 mg/dL — AB (ref 70–99)
GLUCOSE-CAPILLARY: 181 mg/dL — AB (ref 70–99)
Glucose-Capillary: 104 mg/dL — ABNORMAL HIGH (ref 70–99)

## 2014-02-19 LAB — CBC
HCT: 29.7 % — ABNORMAL LOW (ref 39.0–52.0)
HEMOGLOBIN: 9 g/dL — AB (ref 13.0–17.0)
MCH: 25.6 pg — ABNORMAL LOW (ref 26.0–34.0)
MCHC: 30.3 g/dL (ref 30.0–36.0)
MCV: 84.6 fL (ref 78.0–100.0)
Platelets: 386 10*3/uL (ref 150–400)
RBC: 3.51 MIL/uL — AB (ref 4.22–5.81)
RDW: 14.4 % (ref 11.5–15.5)
WBC: 10.5 10*3/uL (ref 4.0–10.5)

## 2014-02-19 MED ORDER — REGADENOSON 0.4 MG/5ML IV SOLN
0.4000 mg | Freq: Once | INTRAVENOUS | Status: AC
  Administered 2014-02-19: 0.4 mg via INTRAVENOUS

## 2014-02-19 MED ORDER — REGADENOSON 0.4 MG/5ML IV SOLN
0.4000 mg | Freq: Once | INTRAVENOUS | Status: AC
  Administered 2014-02-19: 0.4 mg via INTRAVENOUS
  Filled 2014-02-19: qty 5

## 2014-02-19 MED ORDER — TECHNETIUM TC 99M SESTAMIBI GENERIC - CARDIOLITE
10.0000 | Freq: Once | INTRAVENOUS | Status: AC | PRN
  Administered 2014-02-19: 10 via INTRAVENOUS

## 2014-02-19 MED ORDER — MUPIROCIN 2 % EX OINT
1.0000 "application " | TOPICAL_OINTMENT | Freq: Two times a day (BID) | CUTANEOUS | Status: AC
  Administered 2014-02-19 – 2014-02-23 (×7): 1 via NASAL
  Filled 2014-02-19: qty 22

## 2014-02-19 MED ORDER — REGADENOSON 0.4 MG/5ML IV SOLN
INTRAVENOUS | Status: AC
  Administered 2014-02-19: 0.4 mg via INTRAVENOUS
  Filled 2014-02-19: qty 5

## 2014-02-19 MED ORDER — INSULIN DETEMIR 100 UNIT/ML ~~LOC~~ SOLN
25.0000 [IU] | Freq: Every day | SUBCUTANEOUS | Status: DC
  Administered 2014-02-19 – 2014-03-03 (×13): 25 [IU] via SUBCUTANEOUS
  Filled 2014-02-19 (×14): qty 0.25

## 2014-02-19 MED ORDER — TECHNETIUM TC 99M SESTAMIBI GENERIC - CARDIOLITE
30.0000 | Freq: Once | INTRAVENOUS | Status: AC | PRN
  Administered 2014-02-19: 30 via INTRAVENOUS

## 2014-02-19 MED ORDER — CHLORHEXIDINE GLUCONATE CLOTH 2 % EX PADS
6.0000 | MEDICATED_PAD | Freq: Every day | CUTANEOUS | Status: DC
  Administered 2014-02-19 – 2014-02-23 (×4): 6 via TOPICAL

## 2014-02-19 NOTE — Progress Notes (Addendum)
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 Otherwise denies CP or SOB.   CURRENT MEDS . sodium chloride   Intravenous Once  . antiseptic oral rinse  7 mL Mouth Rinse BID  . aspirin  81 mg Oral Daily  . atorvastatin  80 mg Oral q1800  . Chlorhexidine Gluconate Cloth  6 each Topical Q0600  . cloNIDine  0.1 mg Transdermal Weekly  . ferrous sulfate  325 mg Oral BID  . gabapentin  300 mg Oral TID  . hydrALAZINE  25 mg Oral 3 times per day  . insulin aspart  0-20 Units Subcutaneous TID WC  . insulin aspart  0-5 Units Subcutaneous QHS  . insulin detemir  50 Units Subcutaneous Q2200  . isosorbide mononitrate  30 mg Oral Daily  . metoprolol  25 mg Oral BID  . mupirocin ointment  1 application Nasal BID  . sodium chloride  10-40 mL Intracatheter Q12H  . sodium chloride  3 mL Intravenous Q12H  . triamcinolone cream  1 application Topical BID  . vancomycin  750 mg Intravenous Q12H    OBJECTIVE  Filed Vitals:   02/18/14 1752 02/18/14 2201 02/18/14 2312 02/19/14 0501  BP: 152/58 160/65 165/70 147/82  Pulse:  70 72 70  Temp:  100 F (37.8 C)  100 F (37.8 C)  TempSrc:  Oral  Oral  Resp:  22  20  Height:      Weight:      SpO2:  97%  84%    Intake/Output Summary (Last 24 hours) at 02/19/14 0914 Last data filed at 02/19/14 0501  Gross per 24 hour  Intake    350 ml  Output   1700 ml  Net  -1350 ml   Filed Weights   02/11/14 1328  02/11/14 2059 02/12/14 0535  Weight: 198 lb (89.812 kg) 198 lb 6.6 oz (90 kg) 194 lb 7.1 oz (88.2 kg)    PHYSICAL EXAM  General: Pleasant, NAD. HEENT:  Normal  Neck: Supple without bruits or JVD. Lungs:  Resp regular and unlabored, CTA. Heart: RRR no s3, s4, or murmurs.  Extremities: No  edema.    Tele:  SR  One minute of pacer mediated tachycardia 120 bpm  Reverted after PVC   Accessory Clinical Findings  CBC  Recent Labs  02/18/14 0450 02/19/14 0508  WBC 7.9 10.5  HGB 9.4* 9.0*  HCT 30.1* 29.7*  MCV 86.0 84.6  PLT 325 386   Basic Metabolic Panel  Recent Labs  02/17/14 0420 02/18/14 0450 02/19/14 0508  NA 138  --  137  K 3.9  --  3.8  CL 103  --  101  CO2 29  --  32  GLUCOSE 178*  --  150*  BUN 15  --  9  CREATININE 1.37* 1.21 1.21  CALCIUM 8.6  --  8.6    TELE  A paced and AV paced. Some sort of  wide complex PM mediated tachycardia on tele. MD to review.  Radiology/Studies  Dg Chest 2 View  02/11/2014   CLINICAL DATA:  Fever.  Hypoxia.  EXAM: CHEST  2 VIEW  COMPARISON:  07/15/2005 and 07/10/2005  FINDINGS: Pacemaker in place. Previous median sternotomy. There is slight pulmonary vascular congestion but overall heart size is normal. The hilar structures are prominent, right more than left but I feel this is due to the vascular congestion. No effusions. No acute osseous abnormality.  IMPRESSION: Slight pulmonary vascular congestion.   Electronically Signed   By: Geanie [name] M.D.   On: 02/11/2014 15:53   Ct Angio Chest Pe W/cm &/or Wo Cm  02/11/2014   CLINICAL DATA:  Short of breath and hypoxia.  Chills and fever  EXAM: CT ANGIOGRAPHY CHEST WITH CONTRAST  TECHNIQUE: Multidetector CT imaging of the chest was performed using the standard protocol during bolus administration of intravenous contrast. Multiplanar CT image reconstructions and MIPs were obtained to evaluate the vascular anatomy.  CONTRAST:  OMNIPAQUE IOHEXOL 350 MG/ML SOLN  COMPARISON:  None.   FINDINGS: Mediastinum: There is moderate cardiac enlargement. The patient is status post median sternotomy and CABG procedure. No pericardial effusion identified. The trachea appears patent and is midline. Normal appearance of the esophagus. Prominent AP window lymph node measures 1.4 cm, image number 35/series 6. The main pulmonary artery appears patent. No lobar or segmental pulmonary artery filling defects to suggest a clinically significant pulmonary embolus.  Lungs/Pleura: No airspace consolidation. Mild lower lobe predominant dependent changes are identified including subsegmental atelectasis.  Upper Abdomen: The visualized portions of the liver and spleen are normal. The adrenal glands are unremarkable. The visualized portions of the gallbladder are also normal.  Musculoskeletal: Mild multi level degenerative disc disease identified within the thoracic spine. There is a curvature of the thoracic spine which is convex towards the right.  Review of the MIP images confirms the above findings.  IMPRESSION: 1. No evidence for acute pulmonary embolus. 2. Cardiac enlargement status post CABG procedure.   Electronically Signed   By: Signa Kell M.D.   On: 02/11/2014 17:21   US Pelvis Limited  02/12/2014   CLINICAL DATA:  Fever. Evaluate for abscess. Palpable abnormality right inguinal region.  EXAM: LIMITED ULTRASOUND OF PELVIS  TECHNIQUE: Limited transabdominal ultrasound examination of the pelvis was performed.  COMPARISON:  None.  FINDINGS: A large 11.4 x 10.0 x 4.6 cm right groin complex fluid collection is noted. This would be consistent with a abscess. To exclude other pathology including hernia, CT can be obtained if needed.  IMPRESSION: Large 11.4 x 10.0 x 4.6 cm right groin complex fluid collection noted. This would be consistent with an abscess. Further evaluation with CT can be obtained if needed .   Electronically Signed   By: Maisie Fus  Register   On: 02/12/2014 10:13   Dg Chest Port 1  View  02/12/2014   CLINICAL DATA:  Status post PICC line placement.  EXAM: PORTABLE CHEST - 1 VIEW  COMPARISON:  February 11, 2014.  FINDINGS: Stable cardiomediastinal silhouette. Sternotomy wires are noted. Stable mild central pulmonary vascular congestion. Right-sided pacemaker is unchanged in position. No pneumothorax or pleural effusion is noted. Interval placement of left-sided PICC line with distal tip overlying expected position of the SVC.  IMPRESSION: Interval placement of left-sided PICC line with distal tip overlying expected position of the SVC.   Electronically Signed   By: Roque Lias M.D.   On: 02/12/2014 14:52  Ct Angio Abd/pel W/ And/or W/o  02/14/2014   CLINICAL DATA:  Complex fluid collection of the right groin and status post prior aortobifemoral bypass grafting in 2007 with prior revision of the mid pole right graft limb  EXAM: CTA ABDOMEN AND PELVIS WITHOUT CONTRAST  TECHNIQUE: Multidetector CT imaging of the abdomen and pelvis was performed using the standard protocol during bolus administration of intravenous contrast. Multiplanar reconstructed images and MIPs were obtained and reviewed to evaluate the vascular anatomy.  CONTRAST:  80mL OMNIPAQUE IOHEXOL 350 MG/ML SOLN  COMPARISON:  Right groin ultrasound on 02/12/2014  FINDINGS: Complex fluid collection of the right inguinal region present with 3 separate components that likely communicate with 1 another. Superior component is associated with the inferior abdominal wall and measures approximately 5.5 cm in diameter. Surrounding inflammation present. The deep portion of this fluid collection abuts the distal right limb of an aortobifemoral graft without evidence of graft pseudoaneurysm or erosion. No air is identified adjacent to the graft or in the fluid collection  Communicating second pocket of fluid extends towards the lower inguinal region and measures approximately 5 cm in diameter. There is a soft tissue wound just superficial to  the second component of loculated fluid. The wound itself does not appear to extend all the way to the level of fluid. Third communicating pocket extends into the right inguinal canal and measures approximately 6 cm in greatest diameter. In the coronal projection, all of the fluid collections at up to roughly 12 cm in maximal diameter. Findings are likely consistent with multi loculated abscess given constellation of CT and ultrasound findings.  The aortobifemoral graft itself shows normal patency. Both native SFA origins appear to likely be occluded chronically. Native abdominal aorta above the graft shows no evidence of aneurysmal disease or significant stenosis. There is approximately 80% narrowing at the origin of the celiac axis. The superior mesenteric artery trunk shows diffuse and significant atherosclerosis. Bilateral renal arteries are heavily calcified proximally.  The liver shows steatosis. There is reflux of contrast into the intrahepatic IVC and hepatic veins consistent with a component of right heart failure. The visualized lung bases show small bilateral pleural effusions.  Rounded hyperdense cortical lesion of the lateral mid left kidney measures approximately 13 mm in diameter. This represents either a hemorrhagic cyst or enhancing cortical lesion. No abnormalities are seen involving bowel. The bladder appears unremarkable. No bony abnormalities.  Review of the MIP images confirms the above findings.  IMPRESSION: 1. Complex and multi lobulated fluid collection in the right inguinal region with 3 separate components present. These likely communicate with one another and span over a distance of roughly 12 cm. Part of the fluid collection does abut the distal right limb of the aortobifemoral graft. No evidence of gas in the fluid or graft pseudoaneurysm. This most likely represents a complex abscess. Inferior component of fluid extends into the right inguinal canal. 2. Probable underlying right heart  failure with small bilateral pleural effusions. 3. 13 mm hyperdense cortical lesion of the mid lateral left renal cortex. This is incompletely evaluated on the CTA study. This could represent a hyperdense/hemorrhagic cyst or enhancing cortical neoplasm. Followup is recommended. The most definitive evaluation would be MRI of the abdomen when the patient is able to tolerate MRI examination.   Electronically Signed   By: Irish Lack M.D.   On: 02/14/2014 15:41    ASSESSMENT AND PLAN [name] is a 64 y.o. male with a history of COPD, CAD  s/p CABG (2003), DM 2, PVD, OSA , SSS s/p pacer who was transferred from [facility] to [facility] on 02/12/14 with acute respiratory failure. He had a recent aorta femoral bypass and was found to have an abscess in his groin s/p I&D of the abscess by Dr. Darrick Penna on 02/15/13. Cardiology was consulted for elevated troponin.  Elevated troponin:  Poss demand ischemia in setting of CAD and sepsis  Troponin is trending down 8.01--> 3.07--> 2.31--> 1.32.  However, he will need surgery on groin abscess and needs pre-operative clearance.  -- Echo 02/12/14 with normal LV function.  -- Need lexiscan myoview, planned for today.  -- Continue ASA, statin, beta blocker, Imdur.   Anemia- H/H stale at 9.0/29.7  Acute kidney injury- creat improving. 1.71 (12/31) --> 1.21  Acute respiratory failure-. Now resolved  Sepsis/Due groin abscess with infected graft: -- Cultures growing MRSA, continue on vancomycin.  -- Ct angio of graft showed multiple abscess and possible infected native graft. Vascular and Plastic Surgery, onboard. Anticipate surgery later this week. -- Blood cultures negative and final. -- S/p I&D of groin abscess, cultures from 02/15/2013 MRSA -- Per IM and vascular. He will require surgical repair and need cardiology clearance prior to upcoming surgery. Lexiscan myoview today  HTN- hydralazine  TID added to BP regimen yesterday with better control.  -- Continue  lopressor  BID, imdur . Clonidine patch 0.1mg  added back  Abnormal CT abdomen- 3. 13 mm hyperdense cortical lesion of the mid lateral left renal cortex. Followup is recommended. -- Per IM  SSS s/p PPM- patinet with episode of what appears to be pacemaker mediated tachycardia this am Asymptomatic  Lasted about 1 min  Converted after PVC  Duing lexiscan had another spell Will have device interrogated     Signed, Collin Hora PA-C  Pager [phone]  Patinet seen and examined  I have amended note above by [name] to reflect my findings. Pacer to be interrogated Myoview pending.    [name]

## 2014-02-19 NOTE — Progress Notes (Signed)
[facility] PROGRESS NOTE Interim history: 64 year old male with past medical history a recent aorta femoral bypass, who came in with acute hypoxic respiratory failure, sepsis and elevated cardiac enzymes. Started on empiric antibiotics. CT imaging of the Chest was done in ED showed no PE. He also developed acute kidney injury probably due to sepsis, ACE inhibitor and contrast-induced nephropathy which resolved. Groin ultrasound showed abscess, vascular surgery consulted recommended a CT scan of the abdomen and pelvis with contrast emergently on 02/14/2014 which showed multiple loculated abscesses and possible infected native graft, he is  status post I&D and possible graft infection on 02/15/2014. Blood cultures negative but abscess cultures are growing MRSA, continued on vancomycin and cefepime was discontinued.  He will need an ischemic evaluation.  He was transfused 1 unit of PRBC.  Given clinical improvement, he was transferred out of stepdown to telemetry on 02/17/2014.  Plastic surgery was consulted by vascular surgery, anticipate surgery later this week after myocardial perfusion scan and depending on cardiac recommendations.   Filed Weights   02/11/14 1328 02/11/14 2059 02/12/14 0535  Weight: 89.812 kg (198 lb) 90 kg (198 lb 6.6 oz) 88.2 kg (194 lb 7.1 oz)        Intake/Output Summary (Last 24 hours) at 02/19/14 1610 Last data filed at 02/19/14 0501  Gross per 24 hour  Intake    350 ml  Output   1700 ml  Net  -1350 ml     Assessment/Plan: Sepsis/Due groin abscess with infected graft: - Cultures growing MRSA, continue on vancomycin.  - Ct angio of graft showed multiple abscess and possible infected native graft. Vascular and Plastic Surgery, onboard.  Anticipate surgery later this week. - Blood cultures negative and final. - S/p I&D of groin abscess, cultures from 02/15/2013 MRSA. - Vascular would like to change the dressing due to the high risk of bleeding.  Acute  respiratory failure with hypoxia - Likely due to sepsis of the groin and demand ischemia.  Resolved.  AKI (acute kidney injury)/acute toxic nephropathy: - Multifactorial due to sepsis, ACE inhibitor and contrast-induced nephropathy. ACE held. - Mild increase in renal failure, improved.  Elevated Troponin's: - Cont aspirin, and beta blockers. - Cardiology on board, recommended an ischemic workup, stress test today.   Hyponatremia - Resolved with hydration.  Chronic obstructive pulmonary disease - Stable.  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  Essential hypertension - Not well controlled, restarted patient's clonidine patch.   Normocytic anemia: - S/p 2 units of packed red blood cells. - Try to keep Hbg closer to 9 due to the possibility of ischemic heart disease.  Code Status: Full code  DVT Prophylaxis: SCDs. Family Communication: I discussed the plan with the patient and wife at the bedside.  Disposition Plan: Home when medically stable.    Consultants:  Cardiology  Vascular Surgery  Plastic Surgery  Procedures: ECHO: EF normal no wall motion. Pelvic US 12.30.2015: Large 11.4 x 10.0 x 4.6 cm right groin complex fluid collection noted  Antibiotics:  Vancomycin 02/11/2014   Cefepime 02/11/2014 -> 02/17/2013.  HPI/Subjective: No specific complaints.  Waiting for stress test today.  Objective: Filed Vitals:   02/18/14 1752 02/18/14 2201 02/18/14 2312 02/19/14 0501  BP: 152/58 160/65 165/70 147/82  Pulse:  70 72 70  Temp:  100 F (37.8 C)  100 F (37.8 C)  TempSrc:  Oral  Oral  Resp:  22  20  Height:      Weight:      SpO2:  97%  84%     Exam:  Physical Exam: General: Awake, Oriented, No acute distress. HEENT: EOMI. Neck: Supple CV: S1 and S2 Lungs: Clear to ascultation bilaterally Abdomen: Soft, Nontender, Nondistended, +bowel  sounds. Ext: Good pulses. Trace edema. Skin: dressing of right groin   Data Reviewed: Basic Metabolic Panel:  Recent Labs Lab 02/14/14 0410 02/15/14 1415 02/15/14 2245 02/16/14 0350 02/17/14 0420 02/18/14 0450 02/19/14 0508  NA 133* 136  --  136 138  --  137  K 4.2 4.2  --  3.9 3.9  --  3.8  CL 104 104  --  101 103  --  101  CO2 23 25  --  31 29  --  32  GLUCOSE 97 115*  --  171* 178*  --  150*  BUN 23 17  --  19 15  --  9  CREATININE 1.71* 1.30  --  1.45* 1.37* 1.21 1.21  CALCIUM 7.9* 8.3*  --  8.5 8.6  --  8.6  MG  --   --  1.8  --   --   --   --    Liver Function Tests: No results for input(s): AST, ALT, ALKPHOS, BILITOT, PROT, ALBUMIN in the last 168 hours. No results for input(s): LIPASE, AMYLASE in the last 168 hours. No results for input(s): AMMONIA in the last 168 hours. CBC:  Recent Labs Lab 02/15/14 0444 02/16/14 0350 02/17/14 0420 02/18/14 0450 02/19/14 0508  WBC 10.1 7.7 7.3 7.9 10.5  HGB 7.8* 8.9* 9.0* 9.4* 9.0*  HCT 24.9* 29.5* 29.6* 30.1* 29.7*  MCV 86.5 83.6 84.3 86.0 84.6  PLT 179 193 244 325 386   Cardiac Enzymes:  Recent Labs Lab 02/12/14 1045 02/12/14 1532 02/13/14 0005  TROPONINI 3.07* 2.31* 1.32*   BNP (last 3 results) No results for input(s): PROBNP in the last 8760 hours. CBG:  Recent Labs Lab 02/18/14 0746 02/18/14 1136 02/18/14 1655 02/18/14 2246 02/19/14 0735  GLUCAP 126* 139* 183* 176* 123*    Recent Results (from the past 240 hour(s))  Blood culture (routine x 2)     Status: None   Collection Time: 02/11/14  2:29 PM  Result Value Ref Range Status   Specimen Description BLOOD RIGHT HAND  Final   Special Requests BOTTLES DRAWN AEROBIC AND ANAEROBIC 4CC  Final   Culture NO GROWTH 5 DAYS  Final   Report Status 02/16/2014 FINAL  Final  Blood culture (routine x 2)     Status: None   Collection Time: 02/11/14  2:33 PM  Result Value Ref Range Status   Specimen Description BLOOD RIGHT ANTECUBITAL  Final   Special  Requests BOTTLES DRAWN AEROBIC ONLY 6CC  Final   Culture NO GROWTH 5 DAYS  Final   Report Status 02/16/2014 FINAL  Final  Urine culture     Status: None   Collection Time: 02/11/14  2:56 PM  Result Value Ref Range Status   Specimen Description URINE, CLEAN CATCH  Final   Special Requests NONE  Final   Colony Count NO GROWTH Performed at [facility]   Final   Culture NO GROWTH Performed at [facility]   Final   Report Status 02/12/2014 FINAL  Final  MRSA PCR Screening     Status: None   Collection Time: 02/11/14  6:40 PM  Result Value Ref Range Status   MRSA  by PCR NEGATIVE NEGATIVE Final    Comment:        The GeneXpert MRSA Assay (FDA approved for NASAL specimens only), is one component of a comprehensive MRSA colonization surveillance program. It is not intended to diagnose MRSA infection nor to guide or monitor treatment for MRSA infections.   Wound culture     Status: None   Collection Time: 02/14/14 10:08 AM  Result Value Ref Range Status   Specimen Description WOUND GROIN RIGHT  Final   Special Requests NONE  Final   Gram Stain   Final    NO WBC SEEN NO SQUAMOUS EPITHELIAL CELLS SEEN NO ORGANISMS SEEN Performed at [facility]    Culture   Final    NO GROWTH 3 DAYS Performed at [facility]    Report Status 02/17/2014 FINAL  Final  Body fluid culture     Status: None   Collection Time: 02/15/14  9:49 AM  Result Value Ref Range Status   Specimen Description FLUID RIGHT DRAINAGE GROIN  Final   Special Requests NONE  Final   Gram Stain   Final    ABUNDANT WBC PRESENT, PREDOMINANTLY PMN RARE GRAM POSITIVE COCCI IN PAIRS Performed at [facility]    Culture   Final    MODERATE METHICILLIN RESISTANT STAPHYLOCOCCUS AUREUS Note: RIFAMPIN AND GENTAMICIN SHOULD NOT BE USED AS SINGLE DRUGS FOR TREATMENT OF STAPH INFECTIONS. This organism DOES NOT demonstrate inducible Clindamycin resistance in vitro. CRITICAL RESULT CALLED  TO, READ BACK BY AND VERIFIED WITH: CAROL@8 :58AM ON  02/17/13 BY DANTS Performed at [facility]    Report Status 02/17/2014 FINAL  Final   Organism ID, Bacteria METHICILLIN RESISTANT STAPHYLOCOCCUS AUREUS  Final      Susceptibility   Methicillin resistant staphylococcus aureus - MIC*    CLINDAMYCIN <=0.25 SENSITIVE Sensitive     ERYTHROMYCIN >=8 RESISTANT Resistant     GENTAMICIN <=0.5 SENSITIVE Sensitive     LEVOFLOXACIN >=8 RESISTANT Resistant     OXACILLIN >=4 RESISTANT Resistant     PENICILLIN >=0.5 RESISTANT Resistant     RIFAMPIN <=0.5 SENSITIVE Sensitive     TRIMETH/SULFA >=320 RESISTANT Resistant     VANCOMYCIN 1 SENSITIVE Sensitive     TETRACYCLINE <=1 SENSITIVE Sensitive     * MODERATE METHICILLIN RESISTANT STAPHYLOCOCCUS AUREUS     Studies: No results found.  Scheduled Meds: . sodium chloride   Intravenous Once  . antiseptic oral rinse  7 mL Mouth Rinse BID  . aspirin  81 mg Oral Daily  . atorvastatin  80 mg Oral q1800  . Chlorhexidine Gluconate Cloth  6 each Topical Q0600  . cloNIDine  0.1 mg Transdermal Weekly  . ferrous sulfate  325 mg Oral BID  . gabapentin  300 mg Oral TID  . hydrALAZINE  25 mg Oral 3 times per day  . insulin aspart  0-20 Units Subcutaneous TID WC  . insulin aspart  0-5 Units Subcutaneous QHS  . insulin detemir  50 Units Subcutaneous Q2200  . isosorbide mononitrate  30 mg Oral Daily  . metoprolol  25 mg Oral BID  . mupirocin ointment  1 application Nasal BID  . sodium chloride  10-40 mL Intracatheter Q12H  . sodium chloride  3 mL Intravenous Q12H  . triamcinolone cream  1 application Topical BID  . vancomycin  750 mg Intravenous Q12H   Continuous Infusions:     Irais Mottram A, MD  [facility] If 7PM-7AM, please contact night-coverage at [email or web address],  password [email or web address] 02/19/2014, 8:21 AM  LOS: 8 days

## 2014-02-19 NOTE — Progress Notes (Addendum)
Cath results noted: IMPRESSION: 1. Medium-sized area of mild reversibility involving the anterior and anterior lateral walls. Suspicious for inducible ischemia. 2. Global hypokinesis with lateral wall focal hypokinesis to dyskinesis. 3. Left ventricular ejection fraction 44% 4. Intermediate-risk stress test findings*.  I had tentatively discussed with Dr. Tenny Crawoss earlier who felt he would require cath if abnormal. Given his anemia, recent toxic nephropathy, sepsis, and somewhat odd affect down in nuclear stress test, I would like team B to see him first tomorrow to make final recommendations about the timing of this and speak with him in person regarding this procedure. I notified nursing of this plan and also asked her to pass along to the patient that there were some changes seen on the stress test and that we may need to do further testing in the AM. I sent a message to our team B NP/PA for tomorrow to see him early in the morning.   I have changed his Levemir to 1/2 dose (25 units) starting tonight in order for him to be NPO after midnight. If he does not go for any procedures this will need be adjusted tomorrow.  Dayna Dunn PA-C

## 2014-02-19 NOTE — Progress Notes (Addendum)
   VASCULAR SURGERY ASSESSMENT & PLAN:  * Continue dressing changes to right groin  * Continue Vancomycin  * Dr. Leta Baptisthimmappa considering gracilis flap pending Cardiac clearance  * For Lexiscan myoview today.   SUBJECTIVE: Comfortable  PHYSICAL EXAM: Filed Vitals:   02/18/14 1752 02/18/14 2201 02/18/14 2312 02/19/14 0501  BP: 152/58 160/65 165/70 147/82  Pulse:  70 72 70  Temp:  100 F (37.8 C)  100 F (37.8 C)  TempSrc:  Oral  Oral  Resp:  22  20  Height:      Weight:      SpO2:  97%  84%   Dressing just changed by Karsten RoKim Trinh, PAC , looks clean with good granulation tissue.   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

## 2014-02-19 NOTE — Progress Notes (Signed)
Pt in nuc med for stress test, condom cath leaked in bed, attempted to replace but unable to attach d/t anatomy.  Pt placed to bedpan per request for fecal smear, no bm.  Cleaned and placed on chux.  RN on floor notified.

## 2014-02-19 NOTE — Progress Notes (Signed)
Lexiscan nuc completed. Following Lexiscan he went into the same tachycardic paced rhythm similar to what was seen on telemetry. Dr. Tenny Crawoss will be discussing with Dr. Ladona Ridgelaylor. He was asymptomatic with this and it spontaneously terminated. Rhythm otherwise was either A paced, V paced or AV paced, rare PVC. Note he has somewhat odd affect but was consentable for the test, A+Ox3, and able to follow commands. Nuc techs do report that initial imaging was challenging due to patient motion. Await images [facility] to read. Dayna Dunn PA-C

## 2014-02-20 ENCOUNTER — Encounter (HOSPITAL_COMMUNITY): Payer: Self-pay | Admitting: Cardiovascular Disease

## 2014-02-20 ENCOUNTER — Encounter (HOSPITAL_COMMUNITY)
Admission: EM | Disposition: A | Discharge status: Skilled Nursing Facility | Payer: Self-pay | Source: Home / Self Care | Attending: Internal Medicine

## 2014-02-20 DIAGNOSIS — T827XXD Infection and inflammatory reaction due to other cardiac and vascular devices, implants and grafts, subsequent encounter: Secondary | ICD-10-CM

## 2014-02-20 DIAGNOSIS — D649 Anemia, unspecified: Secondary | ICD-10-CM | POA: Insufficient documentation

## 2014-02-20 DIAGNOSIS — I2510 Atherosclerotic heart disease of native coronary artery without angina pectoris: Secondary | ICD-10-CM

## 2014-02-20 DIAGNOSIS — T827XXA Infection and inflammatory reaction due to other cardiac and vascular devices, implants and grafts, initial encounter: Secondary | ICD-10-CM | POA: Diagnosis present

## 2014-02-20 HISTORY — PX: LEFT HEART CATHETERIZATION WITH CORONARY/GRAFT ANGIOGRAM: SHX5450

## 2014-02-20 LAB — CBC
HCT: 29 % — ABNORMAL LOW (ref 39.0–52.0)
Hemoglobin: 8.7 g/dL — ABNORMAL LOW (ref 13.0–17.0)
MCH: 25.4 pg — AB (ref 26.0–34.0)
MCHC: 30 g/dL (ref 30.0–36.0)
MCV: 84.8 fL (ref 78.0–100.0)
PLATELETS: 420 10*3/uL — AB (ref 150–400)
RBC: 3.42 MIL/uL — AB (ref 4.22–5.81)
RDW: 14.4 % (ref 11.5–15.5)
WBC: 11.2 10*3/uL — ABNORMAL HIGH (ref 4.0–10.5)

## 2014-02-20 LAB — GLUCOSE, CAPILLARY
Glucose-Capillary: 134 mg/dL — ABNORMAL HIGH (ref 70–99)
Glucose-Capillary: 103 mg/dL — ABNORMAL HIGH (ref 70–99)
Glucose-Capillary: 78 mg/dL (ref 70–99)
Glucose-Capillary: 226 mg/dL — ABNORMAL HIGH (ref 70–99)

## 2014-02-20 LAB — BASIC METABOLIC PANEL
Anion gap: 4 — ABNORMAL LOW (ref 5–15)
BUN: 13 mg/dL (ref 6–23)
CALCIUM: 8.4 mg/dL (ref 8.4–10.5)
CO2: 33 mmol/L — ABNORMAL HIGH (ref 19–32)
CREATININE: 1.25 mg/dL (ref 0.50–1.35)
Chloride: 101 mEq/L (ref 96–112)
GFR calc Af Amer: 69 mL/min — ABNORMAL LOW (ref >90)
GFR, EST NON AFRICAN AMERICAN: 59 mL/min — AB (ref >90)
GLUCOSE: 160 mg/dL — AB (ref 70–99)
POTASSIUM: 4.1 mmol/L (ref 3.5–5.1)
Sodium: 138 mmol/L (ref 135–145)

## 2014-02-20 LAB — TYPE AND SCREEN
ABO/RH(D): B POS
ANTIBODY SCREEN: NEGATIVE

## 2014-02-20 SURGERY — LEFT HEART CATHETERIZATION WITH CORONARY/GRAFT ANGIOGRAM
Anesthesia: LOCAL

## 2014-02-20 MED ORDER — SODIUM CHLORIDE 0.9 % IV SOLN
250.0000 mL | INTRAVENOUS | Status: DC
  Administered 2014-02-20: 250 mL via INTRAVENOUS

## 2014-02-20 MED ORDER — ASPIRIN 81 MG PO CHEW: 81.0000 mg | CHEWABLE_TABLET | ORAL | Status: DC

## 2014-02-20 MED ORDER — SODIUM CHLORIDE 0.9 % IJ SOLN: 3.0000 mL | Freq: Two times a day (BID) | INTRAMUSCULAR | Status: DC

## 2014-02-20 MED ORDER — HEPARIN SODIUM (PORCINE) 1000 UNIT/ML IJ SOLN
INTRAMUSCULAR | Status: AC
  Filled 2014-02-20: qty 1

## 2014-02-20 MED ORDER — NITROGLYCERIN 1 MG/10 ML FOR IR/CATH LAB
INTRA_ARTERIAL | Status: AC
  Filled 2014-02-20: qty 10

## 2014-02-20 MED ORDER — HEPARIN (PORCINE) IN NACL 2-0.9 UNIT/ML-% IJ SOLN
INTRAMUSCULAR | Status: AC
  Filled 2014-02-20: qty 1500

## 2014-02-20 MED ORDER — LIDOCAINE HCL (PF) 1 % IJ SOLN
INTRAMUSCULAR | Status: AC
  Filled 2014-02-20: qty 30

## 2014-02-20 MED ORDER — SODIUM CHLORIDE 0.9 % IV SOLN
INTRAVENOUS | Status: DC
  Administered 2014-02-20 – 2014-02-22 (×5): via INTRAVENOUS

## 2014-02-20 MED ORDER — MIDAZOLAM HCL 2 MG/2ML IJ SOLN
INTRAMUSCULAR | Status: AC
  Filled 2014-02-20: qty 2

## 2014-02-20 MED ORDER — FENTANYL CITRATE 0.05 MG/ML IJ SOLN
INTRAMUSCULAR | Status: AC
  Filled 2014-02-20: qty 2

## 2014-02-20 MED ORDER — SODIUM CHLORIDE 0.9 % IJ SOLN: 3.0000 mL | INTRAMUSCULAR | Status: DC | PRN

## 2014-02-20 MED ORDER — SODIUM CHLORIDE 0.9 % IV SOLN
INTRAVENOUS | Status: AC
Start: 2014-02-20 — End: 2014-02-20

## 2014-02-20 NOTE — Progress Notes (Signed)
Date: 1.7.2016 [facility]   Patient and wife seen in Cath lab recovery. Findings of catheterization noted.  Reviewed with both plan for gracilis muscle flap for coverage groin. Will have 1-2 drains post op, will try to utilize as possible prior scar on thigh. If stable during surgery, will plan to return to 5W post op.  Length of antibiotic treatment to be determined, need to consult ID.   Reviewed risks MI/cardiopulmonary complications, flap failure, continued infection, wound healing problems, need for additional surgery, risk amputation. Presently Hb is 8.7, per IM notes goal to keep 9 or above. Will T&S as I anticipate he will need repeat transfusion in next few days. NPO p MN.  Glenna FellowsBrinda Sheriece Jefcoat, MD [facility] Plastic & Reconstructive Surgery [phone]

## 2014-02-20 NOTE — CV Procedure (Signed)
    Cardiac Catheterization Procedure Note  Name: Collin LeitzWilliam F [name] MRN: [id number] DOB: 03/20/1950  Procedure: Left Heart Cath, Selective Coronary Angiography, SVG angiography, LIMA angiography  Indication: NSTEMI. 64 year-old male with infected aortobifemoral graft with plans for surgery. He had a NSTEMI in the setting of acute medical illness and is now referred for cardiac cath to evaluate for obstructive CAD. He underwent multivessel CABG in 2003.   Procedural Details: The left wrist was prepped, draped, and anesthetized with 1% lidocaine. Using the modified Seldinger technique, a 5/6 French Slender sheath was introduced into the left radial artery. 3 mg of verapamil was administered through the sheath, weight-based unfractionated heparin was administered intravenously. Standard Judkins catheters were used for selective coronary angiography, LIMA angiography, and SVG angiography. Catheter exchanges were performed over an exchange length guidewire. There were no immediate procedural complications. A TR band was used for radial hemostasis at the completion of the procedure.  The patient was transferred to the post catheterization recovery area for further monitoring.  Procedural Findings: Hemodynamics: AO 170/69 LV 180/25  Coronary angiography: Coronary dominance: right  Left mainstem:  The left mainstem is mildly calcified. The vessel is patent as it divides into the LAD and left circumflex.  Left anterior descending (LAD):  The LAD is severely calcified. The vessel is totally occluded proximally.  Left circumflex (LCx):  Left circumflex is totally occluded in its proximal aspect. The vessel is severely calcified.  Right coronary artery (RCA):  The RCA is severely calcified. The vessel is totally occluded.    saphenous vein graft to PDA: the graft is widely patent throughout with no significant stenosis.  Saphenous vein graft sequential to OM1 and OM 2: The graft is widely patent  throughout. Both limbs of the sequence graft are patent with no stenosis. The native obtuse marginal branches are small in caliber.  LIMA to LAD: the vessel was subselectively engaged. The LIMA is widely patent without stenosis. The distal anastomosis appears intact with no stenosis. The mid and distal LAD are patent.  Left ventriculography:  deferred  Contrast:  75 cc Omnipaque  Estimated Blood Loss:  minimal  Final Conclusions:    1. 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 Status post four-vessel CABG with continued patency of all grafts (LIMA to LAD, saphenous vein graft to PDA, saphenous vein graft sequential to OM1 and OM 2)  Recommendations:  The patient has patency of all bypass grafts. He should be at low risk of major ischemic events related to surgery. No further cardiac testing indicated at this point. Will continue with medical therapy as tolerated.  Tonny BollmanMichael Cinnamon Morency MD, [facility]ACC 02/20/2014, 3:40 PM

## 2014-02-20 NOTE — Progress Notes (Signed)
VASCULAR SURGERY: His cardiac catheterization showed patency of all of his bypass grafts. He was felt to be at low risk for major ischemic events or surgery. Dr.Thimmappa plans for gracilis muscle flap to cover the exposed graft in the left groin. Will continue to follow.  Waverly Ferrarihristopher Theresa Wedel, MD, FACS Beeper [phone] 02/20/2014

## 2014-02-20 NOTE — Progress Notes (Signed)
RETURNED FROM CATH LAB,  RIGHT WRIST DSG CDI

## 2014-02-20 NOTE — Progress Notes (Signed)
Appreciate Cardiology work up and notes.  Patient is presently scheduled for surgery for muscle flap to groin for tomorrow 02/21/14. I will assess patient this pm following Cardiology exam and any further workup to make sure this is still possible.  Glenna FellowsBrinda Baker Moronta, MD [facility] Plastic & Reconstructive Surgery [phone]

## 2014-02-20 NOTE — Progress Notes (Signed)
Subjective: No chest pain, no SOB  Objective: Vital signs in last 24 hours: Temp:  [98.8 F (37.1 C)-99 F (37.2 C)] 98.8 F (37.1 C) (01/07 0537) Pulse Rate:  [68-83] 70 (01/07 0632) Resp:  [15-21] 15 (01/07 0537) BP: (106-187)/(51-81) 157/51 mmHg (01/07 0632) SpO2:  [84 %-95 %] 95 % (01/07 0700) Weight change:  Last BM Date: 02/19/14 Intake/Output from previous day: -495 01/06 0701 - 01/07 0700 In: 480 [P.O.:30; IV Piggyback:450] Out: 975 [Urine:975] Intake/Output this shift:    PE: General:Pleasant affect, NAD Skin:Warm and dry, brisk capillary refill HEENT:normocephalic, sclera clear, mucus membranes moist Heart:S1S2 RRR without murmur, gallup, rub or click Lungs:clear without rales, rhonchi, or wheezes NUU:VOZDAbd:soft, non tender, + BS, do not palpate liver spleen or masses Ext:no lower ext edema,Rt groin dressing in place, 2+ radial pulses Neuro:alert and oriented X 3, MAE, follows commands, + facial symmetry  tele:  Tachycardia at times, otherwise a paced Pacemaker interrogation, [employer] 14 episodes of increased ventricular rate since 2014.  1:1 AT 01/24/14, retrograde conduction, PVARP programmed to 275.  Lab Results:  Recent Labs  02/19/14 0508 02/20/14 0500  WBC 10.5 11.2*  HGB 9.0* 8.7*  HCT 29.7* 29.0*  PLT 386 420*   BMET  Recent Labs  02/19/14 0508 02/20/14 0500  NA 137 138  K 3.8 4.1  CL 101 101  CO2 32 33*  GLUCOSE 150* 160*  BUN 9 13  CREATININE 1.21 1.25  CALCIUM 8.6 8.4   No results for input(s): TROPONINI in the last 72 hours.  Invalid input(s): CK, MB  Lab Results  Component Value Date   CHOL 125 12/03/2013   HDL 23* 12/03/2013   LDLCALC 39 12/03/2013   LDLDIRECT 77 09/17/2012   TRIG 317* 12/03/2013   CHOLHDL 5.4 12/03/2013   Lab Results  Component Value Date   HGBA1C 11.1* 12/31/2013     Lab Results  Component Value Date   TSH 1.341 02/11/2014    Hepatic Function Panel No results for input(s): PROT,  ALBUMIN, AST, ALT, ALKPHOS, BILITOT, BILIDIR, IBILI in the last 72 hours. No results for input(s): CHOL in the last 72 hours. No results for input(s): PROTIME in the last 72 hours.     Studies/Results: Nm Myocar Multi W/spect W/wall Motion / Ef  02/19/2014   CLINICAL DATA:  Chest pain.  Prior CABG.  Hypertension.  Smoker.  EXAM: MYOCARDIAL IMAGING WITH SPECT (REST AND PHARMACOLOGIC-STRESS)  GATED LEFT VENTRICULAR WALL MOTION [name]  LEFT VENTRICULAR EJECTION FRACTION  TECHNIQUE: Standard myocardial SPECT imaging was performed after resting intravenous injection of 10 mCi Tc-6312m sestamibi. Subsequently, intravenous infusion of Lexiscan was performed under the supervision of the Cardiology staff. At peak effect of the drug, 30 mCi Tc-8412m sestamibi was injected intravenously and standard myocardial SPECT imaging was performed. Quantitative gated imaging was also performed to evaluate left ventricular wall motion, and estimate left ventricular ejection fraction.  COMPARISON:  08/02/2007  FINDINGS: Perfusion: Mild fixed defect involving the inferior wall. Possibly related to diaphragmatic attenuation.  A rest area of decreased activity involves the mid anterior wall, mild. There is an area of mild reversibility, which is medium in size. This involves the apical to mid segment of the anterior and anterior lateral wall.  Wall Motion: Global hypokinesis. More focal hypokinesis to dyskinesis involving the lateral left ventricular wall.  Left Ventricular Ejection Fraction: 44 %  End diastolic volume 130 ml  End systolic volume 73 ml  IMPRESSION:  1. Medium-sized area of mild reversibility involving the anterior and anterior lateral walls. Suspicious for inducible ischemia.  2. Global hypokinesis with lateral wall focal hypokinesis to dyskinesis.  3. Left ventricular ejection fraction 44%  4. Intermediate-risk stress test findings*.  *2012 Appropriate Use Criteria for Coronary Revascularization Focused Update: J Am Coll  Cardiol. 2012;59(9):857-881. http://content.dementiazones.com.aspx?articleid=1201161   Electronically Signed   By: Jeronimo Greaves M.D.   On: 02/19/2014 16:26    Medications: I have reviewed the patient's current medications. Scheduled Meds: . sodium chloride   Intravenous Once  . antiseptic oral rinse  7 mL Mouth Rinse BID  . aspirin  81 mg Oral Daily  . atorvastatin  80 mg Oral q1800  . Chlorhexidine Gluconate Cloth  6 each Topical Q0600  . cloNIDine  0.1 mg Transdermal Weekly  . ferrous sulfate  325 mg Oral BID  . gabapentin  300 mg Oral TID  . hydrALAZINE  25 mg Oral 3 times per day  . insulin aspart  0-20 Units Subcutaneous TID WC  . insulin aspart  0-5 Units Subcutaneous QHS  . insulin detemir  25 Units Subcutaneous Q2200  . isosorbide mononitrate  30 mg Oral Daily  . metoprolol  25 mg Oral BID  . mupirocin ointment  1 application Nasal BID  . sodium chloride  10-40 mL Intracatheter Q12H  . sodium chloride  3 mL Intravenous Q12H  . triamcinolone cream  1 application Topical BID  . vancomycin  750 mg Intravenous Q12H   Continuous Infusions:  PRN Meds:.acetaminophen, ipratropium-albuterol, ondansetron **OR** ondansetron (ZOFRAN) IV, sodium chloride, traMADol  [name]: IMPRESSION: 1. Medium-sized area of mild reversibility involving the anterior and anterior lateral walls. Suspicious for inducible ischemia. 2. Global hypokinesis with lateral wall focal hypokinesis to dyskinesis. 3. Left ventricular ejection fraction 44% 4. Intermediate-risk stress test findings.  Assessment/Plan: [name] is a 64 y.o. male with a history of COPD, CAD s/p CABG (2003), DM 2, PVD, OSA , SSS s/p pacer who was transferred from [facility] on 02/12/14 with acute respiratory failure. He had a recent aorta femoral bypass and was found to have an abscess in his groin s/p I&D of the abscess by Dr. Darrick Penna on 02/15/13. Cardiology was consulted for elevated troponin.  EF 60-65% on echo, mild aortic  stenosis. PA pk pressure was 59 mmHg.  Now with intermediate-risk for ischemia with troponin of 8.    Elevated troponin: Poss demand ischemia in setting of CAD and sepsis Troponin is trending down 8.01--> 3.07--> 2.31--> 1.32. However, he will need surgery on groin abscess and needs pre-operative clearance.  -- Echo 02/12/14 with normal LV function.  -- Steffanie Dunn, abnormal see above- these are new changes since last Collin 2009.  Pt has been NPO for possible cardiac cath. No cardiac cath since CABG. -- Continue ASA, statin, beta blocker, Imdur.  Discussed with Dr. Tenny Craw, plan for cardiac cath.  Discussed with pt and his wife with risk 1% chance of stroke heart attack or death with cardiac cath but with need for surgery and NSTEMI along with abnormal stress test mor prudent to proceed with cath.  Pt and his wife agreeable.  Anemia- H/H stale at 9.0/29.7 today 8.7/29  Acute kidney injury- creat improving. 1.71 (12/31) --> 1.21--> 1.25  Acute respiratory failure-. Now resolved  Sepsis/Due groin abscess with infected graft: -- Cultures growing MRSA, continue on vancomycin.  -- Ct angio of graft showed multiple abscess and possible infected native graft. Vascular and Plastic Surgery,  onboard. Anticipate surgery later this week. See note by plastics -- Blood cultures negative and final. -- S/p I&D of groin abscess, cultures from 02/15/2013 MRSA -- Per IM and vascular. He will require surgical repair and need cardiology clearance prior to upcoming surgery. MD to see to determine cath  HTN- hydralazine  TID added to BP regimen yesterday with better control. Though still with spikes. -- Continue lopressor  BID, imdur . Clonidine patch 0.1mg  added back  Abnormal CT abdomen- 3. 13 mm hyperdense cortical lesion of the mid lateral left renal cortex. Followup is recommended. -- Per IM  SSS s/p PPM- patinet with episode of what appears to be pacemaker mediated tachycardia Yesterday  and several times on the 3rd.  Asymptomatic Lasted about 1 min Converted after PVC Duing lexiscan had another spell See above note concerning interrogation.   DM Followed by IM   LOS: 9 days   Time spent with pt. :15 minutes. [facility] R  Nurse Practitioner Certified Pager [phone] or after 5pm and on weekends call [phone] 02/20/2014, 7:54 AM  Patient seen and examined  I have reviewed myoview from yesterday  There is evid of mod anterior/anterolateral ischemia.  With recent NSTEMI it would be important to know coronary anatomy before patient proceeds to plastic surgery Patient and wife understand risks  He agrees to proceed.    Dietrich Pates

## 2014-02-20 NOTE — Interval H&P Note (Signed)
History and Physical Interval Note:  02/20/2014 2:48 PM  [name]  has presented today for surgery, with the diagnosis of positive stress test, positive troponins  The various methods of treatment have been discussed with the patient and family. After consideration of risks, benefits and other options for treatment, the patient has consented to  Procedure(s): LEFT HEART CATHETERIZATION WITH CORONARY/GRAFT ANGIOGRAM (N/A) as a surgical intervention .  The patient's history has been reviewed, patient examined, no change in status, stable for surgery.  I have reviewed the patient's chart and labs.  Questions were answered to the patient's satisfaction.    Cath Lab Visit (complete for each Cath Lab visit)  Clinical Evaluation Leading to the Procedure:   ACS: Yes.    Non-ACS:    Anginal Classification: CCS IV  Anti-ischemic medical therapy: Minimal Therapy (1 class of medications)  Non-Invasive Test Results: Intermediate-risk stress test findings: cardiac mortality 1-3%/year  Prior CABG: Previous CABG       Tonny BollmanMichael Emme Rosenau

## 2014-02-20 NOTE — Progress Notes (Signed)
ANTIBIOTIC CONSULT NOTE - FOLLOW UP  Pharmacy Consult for Vancomycin Indication: Groin wound  Allergies  Allergen Reactions  . Morphine And Related     Pt felt flushed and was diaphoretic     Labs:  Recent Labs  02/18/14 0450 02/19/14 0508 02/20/14 0500  WBC 7.9 10.5 11.2*  HGB 9.4* 9.0* 8.7*  PLT 325 386 420*  CREATININE 1.21 1.21 1.25   Estimated Creatinine Clearance: 61 mL/min (by C-G formula based on Cr of 1.25).  Recent Labs  02/17/14 1535  VANCOTROUGH 19.6     Microbiology: Recent Results (from the past 720 hour(s))  Blood culture (routine x 2)     Status: None   Collection Time: 02/11/14  2:29 PM  Result Value Ref Range Status   Specimen Description BLOOD RIGHT HAND  Final   Special Requests BOTTLES DRAWN AEROBIC AND ANAEROBIC 4CC  Final   Culture NO GROWTH 5 DAYS  Final   Report Status 02/16/2014 FINAL  Final  Blood culture (routine x 2)     Status: None   Collection Time: 02/11/14  2:33 PM  Result Value Ref Range Status   Specimen Description BLOOD RIGHT ANTECUBITAL  Final   Special Requests BOTTLES DRAWN AEROBIC ONLY 6CC  Final   Culture NO GROWTH 5 DAYS  Final   Report Status 02/16/2014 FINAL  Final  Urine culture     Status: None   Collection Time: 02/11/14  2:56 PM  Result Value Ref Range Status   Specimen Description URINE, CLEAN CATCH  Final   Special Requests NONE  Final   Colony Count NO GROWTH Performed at [facility]   Final   Culture NO GROWTH Performed at [facility]   Final   Report Status 02/12/2014 FINAL  Final  MRSA PCR Screening     Status: None   Collection Time: 02/11/14  6:40 PM  Result Value Ref Range Status   MRSA by PCR NEGATIVE NEGATIVE Final    Comment:        The GeneXpert MRSA Assay (FDA approved for NASAL specimens only), is one component of a comprehensive MRSA colonization surveillance program. It is not intended to diagnose MRSA infection nor to guide or monitor treatment for MRSA  infections.   Wound culture     Status: None   Collection Time: 02/14/14 10:08 AM  Result Value Ref Range Status   Specimen Description WOUND GROIN RIGHT  Final   Special Requests NONE  Final   Gram Stain   Final    NO WBC SEEN NO SQUAMOUS EPITHELIAL CELLS SEEN NO ORGANISMS SEEN Performed at [facility]    Culture   Final    NO GROWTH 3 DAYS Performed at [facility]    Report Status 02/17/2014 FINAL  Final  Body fluid culture     Status: None   Collection Time: 02/15/14  9:49 AM  Result Value Ref Range Status   Specimen Description FLUID RIGHT DRAINAGE GROIN  Final   Special Requests NONE  Final   Gram Stain   Final    ABUNDANT WBC PRESENT, PREDOMINANTLY PMN RARE GRAM POSITIVE COCCI IN PAIRS Performed at [facility]    Culture   Final    MODERATE METHICILLIN RESISTANT STAPHYLOCOCCUS AUREUS Note: RIFAMPIN AND GENTAMICIN SHOULD NOT BE USED AS SINGLE DRUGS FOR TREATMENT OF STAPH INFECTIONS. This organism DOES NOT demonstrate inducible Clindamycin resistance in vitro. CRITICAL RESULT CALLED TO, READ BACK BY AND VERIFIED WITH: CAROL@8 :58AM ON  02/17/13 BY DANTS [facility]formed at [facility]    Report Status 02/17/2014 FINAL  Final   Organism ID, Bacteria METHICILLIN RESISTANT STAPHYLOCOCCUS AUREUS  Final      Susceptibility   Methicillin resistant staphylococcus aureus - MIC*    CLINDAMYCIN <=0.25 SENSITIVE Sensitive     ERYTHROMYCIN >=8 RESISTANT Resistant     GENTAMICIN <=0.5 SENSITIVE Sensitive     LEVOFLOXACIN >=8 RESISTANT Resistant     OXACILLIN >=4 RESISTANT Resistant     PENICILLIN >=0.5 RESISTANT Resistant     RIFAMPIN <=0.5 SENSITIVE Sensitive     TRIMETH/SULFA >=320 RESISTANT Resistant     VANCOMYCIN 1 SENSITIVE Sensitive     TETRACYCLINE <=1 SENSITIVE Sensitive     * MODERATE METHICILLIN RESISTANT STAPHYLOCOCCUS AUREUS    Anti-infectives    Start     Dose/Rate Route Frequency Ordered Stop   02/16/14 2000  ceFEPIme (MAXIPIME) 1 g  in dextrose 5 % 50 mL IVPB  Status:  Discontinued     1 g100 mL/hr over 30 Minutes Intravenous Every 12 hours 02/16/14 0938 02/17/14 1357   02/14/14 0730  ceFEPIme (MAXIPIME) 1 g in dextrose 5 % 50 mL IVPB  Status:  Discontinued     1 g100 mL/hr over 30 Minutes Intravenous Every 24 hours 02/13/14 1035 02/16/14 0938   02/12/14 1000  oseltamivir (TAMIFLU) capsule 75 mg  Status:  Discontinued     75 mg Oral 2 times daily 02/12/14 0743 02/12/14 0747   02/12/14 1000  oseltamivir (TAMIFLU) capsule 30 mg  Status:  Discontinued     30 mg Oral 2 times daily 02/12/14 0749 02/16/14 0826   02/12/14 0600  vancomycin (VANCOCIN) IVPB 750 mg/150 ml premix     750 mg150 mL/hr over 60 Minutes Intravenous Every 12 hours 02/11/14 2047     02/12/14 0000  ceFEPIme (MAXIPIME) 1 g in dextrose 5 % 50 mL IVPB  Status:  Discontinued     1 g100 mL/hr over 30 Minutes Intravenous Every 8 hours 02/11/14 2047 02/13/14 1035   02/11/14 1545  vancomycin (VANCOCIN) 1,500 mg in sodium chloride 0.9 % 500 mL IVPB     1,500 mg250 mL/hr over 120 Minutes Intravenous  Once 02/11/14 1537 02/11/14 1940   02/11/14 1545  ceFEPIme (MAXIPIME) 2 g in dextrose 5 % 50 mL IVPB     2 g100 mL/hr over 30 Minutes Intravenous  Once 02/11/14 1537 02/11/14 1642      Assessment: 63 yoM on Day # 10 of vancomycin for MRSA wound Scr and WBC WNL Afebrile  Goal of Therapy:  Vancomycin trough level 15-20 mcg/ml  Plan:  Continue vancomycin 750 mg iv Q 12 hours Continue to follow   Thank you. Okey RegalLisa Avrie Kedzierski, PharmD [phone] 1/7/20161:04 PM

## 2014-02-20 NOTE — Progress Notes (Signed)
[facility] PROGRESS NOTE Interim history: 64 year old male with past medical history a recent aorta femoral bypass, who came in with acute hypoxic respiratory failure, sepsis and elevated cardiac enzymes. Started on empiric antibiotics. CT imaging of the Chest was done in ED showed no PE. He also developed acute kidney injury probably due to sepsis, ACE inhibitor and contrast-induced nephropathy which resolved. Groin ultrasound showed abscess, vascular surgery consulted recommended a CT scan of the abdomen and pelvis with contrast emergently on 02/14/2014 which showed multiple loculated abscesses and possible infected native graft, he is  status post I&D and possible graft infection on 02/15/2014. Blood cultures negative but abscess cultures are growing MRSA, continued on vancomycin and cefepime was discontinued.  He will need an ischemic evaluation.  He was transfused 1 unit of PRBC.  Given clinical improvement, he was transferred out of stepdown to telemetry on 02/17/2014.  Plastic surgery was consulted by vascular surgery, anticipate surgery later this week after myocardial perfusion scan and depending on cardiac recommendations.   Filed Weights   02/11/14 1328 02/11/14 2059 02/12/14 0535  Weight: 89.812 kg (198 lb) 90 kg (198 lb 6.6 oz) 88.2 kg (194 lb 7.1 oz)        Intake/Output Summary (Last 24 hours) at 02/20/14 0934 Last data filed at 02/20/14 0538  Gross per 24 hour  Intake    480 ml  Output    975 ml  Net   -495 ml     Assessment/Plan: Sepsis/Due to R groin abscess with infected graft: - Cultures growing MRSA, continue on vancomycin.  - Ct angio of graft showed multiple abscess and possible infected native graft. Vascular and Plastic Surgery, onboard.  Anticipate surgery later this week post Cardiac clearance. - Blood cultures negative and final. - S/p I&D of groin abscess, cultures from 02/15/2013 MRSA. - Vascular would like to change the dressing due to the high risk  of bleeding. - Vascular/Plastics plans surgery ? 1/8.  Acute respiratory failure with hypoxia - Likely due to sepsis of the groin and demand ischemia.  Resolved.  AKI (acute kidney injury)/acute toxic nephropathy: - Multifactorial due to sepsis, ACE inhibitor and contrast-induced nephropathy. ACE held. - improved. Creatinine has been stable over the last 3 days in the 1.2 range.  Elevated Troponin's: - Cont aspirin, and beta blockers. Cardiology consulted -Abnormal stress test followed by cardiac cath 1/7 showed patent all bypass grafts and cardiology states low risk of major ischemic events related to surgery and no further cardiac testing at this point.  Hyponatremia - Resolved with hydration.  Chronic obstructive pulmonary disease - Stable.  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  Essential hypertension - Mildly uncontrolled and fluctuating.   Normocytic anemia: - S/p 2 units of packed red blood cells. - Try to keep Hbg closer to 9 due to the possibility of ischemic heart disease. - Stable. Follow CBC in a.m.  Code Status: Full code  DVT Prophylaxis: SCDs. Family Communication: Discussed with spouse at bedside..  Disposition Plan: Not medically stable for discharge.    Consultants:  Cardiology  Vascular Surgery  Plastic Surgery  Procedures: ECHO: EF normal no wall motion. Pelvic US 12.30.2015: Large 11.4 x 10.0 x 4.6 cm right groin complex fluid collection noted  LHC 02/20/14  Antibiotics:  Vancomycin 02/11/2014   Cefepime 02/11/2014 -> 02/17/2013.  HPI/Subjective: Patient was seen this morning prior to [facility]. He complained of mild pain at right groin  site. Denied chest pain or dyspnea.   Objective: Filed Vitals:   02/19/14 2149 02/20/14 0537 02/20/14 0632 02/20/14 0700  BP: 154/81 176/64 157/51   Pulse: 70 68 70   Temp: 99 F (37.2 C) 98.8 F (37.1 C)    TempSrc:  Oral Oral    Resp: 21 15    Height:      Weight:      SpO2: 93% 90% 84% 95%     Exam:  Physical Exam: General: Awake, Oriented, No acute distress. HEENT: EOMI. Neck: Supple CV: S1 and S2, RRR. No JVD or murmurs. Telemetry: AV paced rhythm. Lungs: Clear to ascultation bilaterally Abdomen: Soft, Nontender, Nondistended, +bowel sounds. Ext: Good pulses. Trace edema. Skin: dressing of right groin clean and dry.   Data Reviewed: Basic Metabolic Panel:  Recent Labs Lab 02/15/14 1415 02/15/14 2245 02/16/14 0350 02/17/14 0420 02/18/14 0450 02/19/14 0508 02/20/14 0500  NA 136  --  136 138  --  137 138  K 4.2  --  3.9 3.9  --  3.8 4.1  CL 104  --  101 103  --  101 101  CO2 25  --  31 29  --  32 33*  GLUCOSE 115*  --  171* 178*  --  150* 160*  BUN 17  --  19 15  --  9 13  CREATININE 1.30  --  1.45* 1.37* 1.21 1.21 1.25  CALCIUM 8.3*  --  8.5 8.6  --  8.6 8.4  MG  --  1.8  --   --   --   --   --    Liver Function Tests: No results for input(s): AST, ALT, ALKPHOS, BILITOT, PROT, ALBUMIN in the last 168 hours. No results for input(s): LIPASE, AMYLASE in the last 168 hours. No results for input(s): AMMONIA in the last 168 hours. CBC:  Recent Labs Lab 02/16/14 0350 02/17/14 0420 02/18/14 0450 02/19/14 0508 02/20/14 0500  WBC 7.7 7.3 7.9 10.5 11.2*  HGB 8.9* 9.0* 9.4* 9.0* 8.7*  HCT 29.5* 29.6* 30.1* 29.7* 29.0*  MCV 83.6 84.3 86.0 84.6 84.8  PLT 193 244 325 386 420*   Cardiac Enzymes: No results for input(s): CKTOTAL, CKMB, CKMBINDEX, TROPONINI in the last 168 hours. BNP (last 3 results) No results for input(s): PROBNP in the last 8760 hours. CBG:  Recent Labs Lab 02/18/14 2246 02/19/14 0735 02/19/14 1650 02/19/14 2228 02/20/14 0731  GLUCAP 176* 123* 104* 181* 134*    Recent Results (from the past 240 hour(s))  Blood culture (routine x 2)     Status: None   Collection Time: 02/11/14  2:29 PM  Result Value Ref Range Status   Specimen Description BLOOD  RIGHT HAND  Final   Special Requests BOTTLES DRAWN AEROBIC AND ANAEROBIC 4CC  Final   Culture NO GROWTH 5 DAYS  Final   Report Status 02/16/2014 FINAL  Final  Blood culture (routine x 2)     Status: None   Collection Time: 02/11/14  2:33 PM  Result Value Ref Range Status   Specimen Description BLOOD RIGHT ANTECUBITAL  Final   Special Requests BOTTLES DRAWN AEROBIC ONLY 6CC  Final   Culture NO GROWTH 5 DAYS  Final   Report Status 02/16/2014 FINAL  Final  Urine culture     Status: None   Collection Time: 02/11/14  2:56 PM  Result Value Ref Range Status   Specimen Description URINE, CLEAN CATCH  Final   Special Requests NONE  Final  Colony Count NO GROWTH Performed at [facility]   Final   Culture NO GROWTH Performed at [facility]   Final   Report Status 02/12/2014 FINAL  Final  MRSA PCR Screening     Status: None   Collection Time: 02/11/14  6:40 PM  Result Value Ref Range Status   MRSA by PCR NEGATIVE NEGATIVE Final    Comment:        The GeneXpert MRSA Assay (FDA approved for NASAL specimens only), is one component of a comprehensive MRSA colonization surveillance program. It is not intended to diagnose MRSA infection nor to guide or monitor treatment for MRSA infections.   Wound culture     Status: None   Collection Time: 02/14/14 10:08 AM  Result Value Ref Range Status   Specimen Description WOUND GROIN RIGHT  Final   Special Requests NONE  Final   Gram Stain   Final    NO WBC SEEN NO SQUAMOUS EPITHELIAL CELLS SEEN NO ORGANISMS SEEN Performed at [facility]    Culture   Final    NO GROWTH 3 DAYS Performed at [facility]    Report Status 02/17/2014 FINAL  Final  Body fluid culture     Status: None   Collection Time: 02/15/14  9:49 AM  Result Value Ref Range Status   Specimen Description FLUID RIGHT DRAINAGE GROIN  Final   Special Requests NONE  Final   Gram Stain   Final    ABUNDANT WBC PRESENT, PREDOMINANTLY PMN RARE  GRAM POSITIVE COCCI IN PAIRS Performed at [facility]    Culture   Final    MODERATE METHICILLIN RESISTANT STAPHYLOCOCCUS AUREUS Note: RIFAMPIN AND GENTAMICIN SHOULD NOT BE USED AS SINGLE DRUGS FOR TREATMENT OF STAPH INFECTIONS. This organism DOES NOT demonstrate inducible Clindamycin resistance in vitro. CRITICAL RESULT CALLED TO, READ BACK BY AND VERIFIED WITH: CAROL@8 :58AM ON  02/17/13 BY DANTS Performed at [facility]    Report Status 02/17/2014 FINAL  Final   Organism ID, Bacteria METHICILLIN RESISTANT STAPHYLOCOCCUS AUREUS  Final      Susceptibility   Methicillin resistant staphylococcus aureus - MIC*    CLINDAMYCIN <=0.25 SENSITIVE Sensitive     ERYTHROMYCIN >=8 RESISTANT Resistant     GENTAMICIN <=0.5 SENSITIVE Sensitive     LEVOFLOXACIN >=8 RESISTANT Resistant     OXACILLIN >=4 RESISTANT Resistant     PENICILLIN >=0.5 RESISTANT Resistant     RIFAMPIN <=0.5 SENSITIVE Sensitive     TRIMETH/SULFA >=320 RESISTANT Resistant     VANCOMYCIN 1 SENSITIVE Sensitive     TETRACYCLINE <=1 SENSITIVE Sensitive     * MODERATE METHICILLIN RESISTANT STAPHYLOCOCCUS AUREUS     Studies: Nm Myocar Multi W/spect W/wall Motion / Ef  02/19/2014   CLINICAL DATA:  Chest pain.  Prior CABG.  Hypertension.  Smoker.  EXAM: MYOCARDIAL IMAGING WITH SPECT (REST AND PHARMACOLOGIC-STRESS)  GATED LEFT VENTRICULAR WALL MOTION STUDY  LEFT VENTRICULAR EJECTION FRACTION  TECHNIQUE: Standard myocardial SPECT imaging was performed after resting intravenous injection of 10 mCi Tc-68m sestamibi. Subsequently, intravenous infusion of Lexiscan was performed under the supervision of the Cardiology staff. At peak effect of the drug, 30 mCi Tc-11m sestamibi was injected intravenously and standard myocardial SPECT imaging was performed. Quantitative gated imaging was also performed to evaluate left ventricular wall motion, and estimate left ventricular ejection fraction.  COMPARISON:  08/02/2007  FINDINGS:  Perfusion: Mild fixed defect involving the inferior wall. Possibly related to diaphragmatic attenuation.  A  rest area of decreased activity involves the mid anterior wall, mild. There is an area of mild reversibility, which is medium in size. This involves the apical to mid segment of the anterior and anterior lateral wall.  Wall Motion: Global hypokinesis. More focal hypokinesis to dyskinesis involving the lateral left ventricular wall.  Left Ventricular Ejection Fraction: 44 %  End diastolic volume 130 ml  End systolic volume 73 ml  IMPRESSION: 1. Medium-sized area of mild reversibility involving the anterior and anterior lateral walls. Suspicious for inducible ischemia.  2. Global hypokinesis with lateral wall focal hypokinesis to dyskinesis.  3. Left ventricular ejection fraction 44%  4. Intermediate-risk stress test findings*.  *2012 Appropriate Use Criteria for Coronary Revascularization Focused Update: J Am Coll Cardiol. 2012;59(9):857-881. http://content.dementiazones.com.aspx?articleid=1201161   Electronically Signed   By: Jeronimo Greaves M.D.   On: 02/19/2014 16:26    Scheduled Meds: . sodium chloride   Intravenous Once  . antiseptic oral rinse  7 mL Mouth Rinse BID  . aspirin  81 mg Oral Daily  . atorvastatin  80 mg Oral q1800  . Chlorhexidine Gluconate Cloth  6 each Topical Q0600  . cloNIDine  0.1 mg Transdermal Weekly  . ferrous sulfate  325 mg Oral BID  . gabapentin  300 mg Oral TID  . hydrALAZINE  25 mg Oral 3 times per day  . insulin aspart  0-20 Units Subcutaneous TID WC  . insulin aspart  0-5 Units Subcutaneous QHS  . insulin detemir  25 Units Subcutaneous Q2200  . isosorbide mononitrate  30 mg Oral Daily  . metoprolol  25 mg Oral BID  . mupirocin ointment  1 application Nasal BID  . sodium chloride  10-40 mL Intracatheter Q12H  . sodium chloride  3 mL Intravenous Q12H  . triamcinolone cream  1 application Topical BID  . vancomycin  750 mg Intravenous Q12H   Continuous  Infusions:    Time: 25 minutes.  Marcellus Scott, MD, FACP, FHM. [facility] Pager [phone]  If 7PM-7AM, please contact night-coverage [email or web address] Password [facility] 02/20/2014, 6:47 PM      LOS: 9 days

## 2014-02-20 NOTE — H&P (View-Only) (Signed)
       Subjective: No chest pain, no SOB  Objective: Vital signs in last 24 hours: Temp:  [98.8 F (37.1 C)-99 F (37.2 C)] 98.8 F (37.1 C) (01/07 0537) Pulse Rate:  [68-83] 70 (01/07 0632) Resp:  [15-21] 15 (01/07 0537) BP: (106-187)/(51-81) 157/51 mmHg (01/07 0632) SpO2:  [84 %-95 %] 95 % (01/07 0700) Weight change:  Last BM Date: 02/19/14 Intake/Output from previous day: -495 01/06 0701 - 01/07 0700 In: 480 [P.O.:30; IV Piggyback:450] Out: 975 [Urine:975] Intake/Output this shift:    PE: General:Pleasant affect, NAD Skin:Warm and dry, brisk capillary refill HEENT:normocephalic, sclera clear, mucus membranes moist Heart:S1S2 RRR without murmur, gallup, rub or click Lungs:clear without rales, rhonchi, or wheezes Abd:soft, non tender, + BS, do not palpate liver spleen or masses Ext:no lower ext edema,Rt groin dressing in place, 2+ radial pulses Neuro:alert and oriented X 3, MAE, follows commands, + facial symmetry  tele:  Tachycardia at times, otherwise a paced Pacemaker interrogation, [employer] 14 episodes of increased ventricular rate since 2014.  1:1 AT 01/24/14, retrograde conduction, PVARP programmed to 275.  Lab Results:  Recent Labs  02/19/14 0508 02/20/14 0500  WBC 10.5 11.2*  HGB 9.0* 8.7*  HCT 29.7* 29.0*  PLT 386 420*   BMET  Recent Labs  02/19/14 0508 02/20/14 0500  NA 137 138  K 3.8 4.1  CL 101 101  CO2 32 33*  GLUCOSE 150* 160*  BUN 9 13  CREATININE 1.21 1.25  CALCIUM 8.6 8.4   No results for input(s): TROPONINI in the last 72 hours.  Invalid input(s): CK, MB  Lab Results  Component Value Date   CHOL 125 12/03/2013   HDL 23* 12/03/2013   LDLCALC 39 12/03/2013   LDLDIRECT 77 09/17/2012   TRIG 317* 12/03/2013   CHOLHDL 5.4 12/03/2013   Lab Results  Component Value Date   HGBA1C 11.1* 12/31/2013     Lab Results  Component Value Date   TSH 1.341 02/11/2014    Hepatic Function Panel No results for input(s): PROT,  ALBUMIN, AST, ALT, ALKPHOS, BILITOT, BILIDIR, IBILI in the last 72 hours. No results for input(s): CHOL in the last 72 hours. No results for input(s): PROTIME in the last 72 hours.     Studies/Results: Nm Myocar Multi W/spect W/wall Motion / Ef  02/19/2014   CLINICAL DATA:  Chest pain.  Prior CABG.  Hypertension.  Smoker.  EXAM: MYOCARDIAL IMAGING WITH SPECT (REST AND PHARMACOLOGIC-STRESS)  GATED LEFT VENTRICULAR WALL MOTION STUDY  LEFT VENTRICULAR EJECTION FRACTION  TECHNIQUE: Standard myocardial SPECT imaging was performed after resting intravenous injection of 10 mCi Tc-99m sestamibi. Subsequently, intravenous infusion of Lexiscan was performed under the supervision of the Cardiology staff. At peak effect of the drug, 30 mCi Tc-99m sestamibi was injected intravenously and standard myocardial SPECT imaging was performed. Quantitative gated imaging was also performed to evaluate left ventricular wall motion, and estimate left ventricular ejection fraction.  COMPARISON:  08/02/2007  FINDINGS: Perfusion: Mild fixed defect involving the inferior wall. Possibly related to diaphragmatic attenuation.  A rest area of decreased activity involves the mid anterior wall, mild. There is an area of mild reversibility, which is medium in size. This involves the apical to mid segment of the anterior and anterior lateral wall.  Wall Motion: Global hypokinesis. More focal hypokinesis to dyskinesis involving the lateral left ventricular wall.  Left Ventricular Ejection Fraction: 44 %  End diastolic volume 130 ml  End systolic volume 73 ml  IMPRESSION:   1. Medium-sized area of mild reversibility involving the anterior and anterior lateral walls. Suspicious for inducible ischemia.  2. Global hypokinesis with lateral wall focal hypokinesis to dyskinesis.  3. Left ventricular ejection fraction 44%  4. Intermediate-risk stress test findings*.  *2012 Appropriate Use Criteria for Coronary Revascularization Focused Update: J Am Coll  Cardiol. 2012;59(9):857-881. http://content.onlinejacc.org/article.aspx?articleid=1201161   Electronically Signed   By: Collin  [name] M.D.   On: 02/19/2014 16:26    Medications: I have reviewed the patient's current medications. Scheduled Meds: . sodium chloride   Intravenous Once  . antiseptic oral rinse  7 mL Mouth Rinse BID  . aspirin  81 mg Oral Daily  . atorvastatin  80 mg Oral q1800  . Chlorhexidine Gluconate Cloth  6 each Topical Q0600  . cloNIDine  0.1 mg Transdermal Weekly  . ferrous sulfate  325 mg Oral BID  . gabapentin  300 mg Oral TID  . hydrALAZINE  25 mg Oral 3 times per day  . insulin aspart  0-20 Units Subcutaneous TID WC  . insulin aspart  0-5 Units Subcutaneous QHS  . insulin detemir  25 Units Subcutaneous Q2200  . isosorbide mononitrate  30 mg Oral Daily  . metoprolol  25 mg Oral BID  . mupirocin ointment  1 application Nasal BID  . sodium chloride  10-40 mL Intracatheter Q12H  . sodium chloride  3 mL Intravenous Q12H  . triamcinolone cream  1 application Topical BID  . vancomycin  750 mg Intravenous Q12H   Continuous Infusions:  PRN Meds:.acetaminophen, ipratropium-albuterol, ondansetron **OR** ondansetron (ZOFRAN) IV, sodium chloride, traMADol  Nuc study: IMPRESSION: 1. Medium-sized area of mild reversibility involving the anterior and anterior lateral walls. Suspicious for inducible ischemia. 2. Global hypokinesis with lateral wall focal hypokinesis to dyskinesis. 3. Left ventricular ejection fraction 44% 4. Intermediate-risk stress test findings.  Assessment/Plan: [name] is a 63 y.o. male with a history of COPD, CAD s/p CABG (2003), DM 2, PVD, OSA , SSS s/p pacer who was transferred from [facility] to [facility] on 02/12/14 with acute respiratory failure. He had a recent aorta femoral bypass and was found to have an abscess in his groin s/p I&D of the abscess by Dr. Fields on 02/15/13. Cardiology was consulted for elevated troponin.  EF 60-65% on echo, mild aortic  stenosis. PA pk pressure was 59 mmHg.  Now with intermediate-risk for ischemia with troponin of 8.    Elevated troponin: Poss demand ischemia in setting of CAD and sepsis Troponin is trending down 8.01--> 3.07--> 2.31--> 1.32. However, he will need surgery on groin abscess and needs pre-operative clearance.  -- Echo 02/12/14 with normal LV function.  -- Lexiscan myoview, abnormal see above- these are new changes since last nuc 2009.  Pt has been NPO for possible cardiac cath. No cardiac cath since CABG. -- Continue ASA, statin, beta blocker, Imdur.  Discussed with Dr. Dinh Ayotte, plan for cardiac cath.  Discussed with pt and his wife with risk 1% chance of stroke heart attack or death with cardiac cath but with need for surgery and NSTEMI along with abnormal stress test mor prudent to proceed with cath.  Pt and his wife agreeable.  Anemia- H/H stale at 9.0/29.7 today 8.7/29  Acute kidney injury- creat improving. 1.71 (12/31) --> 1.21--> 1.25  Acute respiratory failure-. Now resolved  Sepsis/Due groin abscess with infected graft: -- Cultures growing MRSA, continue on vancomycin.  -- Ct angio of graft showed multiple abscess and possible infected native graft. Vascular and Plastic Surgery,   onboard. Anticipate surgery later this week. See note by plastics -- Blood cultures negative and final. -- S/p I&D of groin abscess, cultures from 02/15/2013 MRSA -- Per IM and vascular. He will require surgical repair and need cardiology clearance prior to upcoming surgery. MD to see to determine cath  HTN- hydralazine 25mg TID added to BP regimen yesterday with better control. Though still with spikes. -- Continue lopressor 25mg BID, imdur 30mg. Clonidine patch 0.1mg added back  Abnormal CT abdomen- 3. 13 mm hyperdense cortical lesion of the mid lateral left renal cortex. Followup is recommended. -- Per IM  SSS s/p PPM- patinet with episode of what appears to be pacemaker mediated tachycardia Yesterday  and several times on the 3rd.  Asymptomatic Lasted about 1 min Converted after PVC Duing lexiscan had another spell See above note concerning interrogation.   DM Followed by IM   LOS: 9 days   Time spent with pt. :15 minutes. INGOLD,LAURA R  Nurse Practitioner Certified Pager [phone] or after 5pm and on weekends call [phone] 02/20/2014, 7:54 AM  Patient seen and examined  I have reviewed myoview from yesterday  There is evid of mod anterior/anterolateral ischemia.  With recent NSTEMI it would be important to know coronary anatomy before patient proceeds to plastic surgery Patient and wife understand risks  He agrees to proceed.    Evetta Renner   

## 2014-02-21 ENCOUNTER — Encounter (HOSPITAL_COMMUNITY)
Admission: EM | Disposition: A | Discharge status: Skilled Nursing Facility | Payer: Self-pay | Source: Home / Self Care | Attending: Internal Medicine

## 2014-02-21 ENCOUNTER — Inpatient Hospital Stay (HOSPITAL_COMMUNITY): Payer: Medicare HMO | Admitting: Anesthesiology

## 2014-02-21 ENCOUNTER — Encounter (HOSPITAL_COMMUNITY): Payer: Self-pay | Admitting: Certified Registered"

## 2014-02-21 HISTORY — PX: TRAM: SHX5363

## 2014-02-21 LAB — CBC
HCT: 28.8 % — ABNORMAL LOW (ref 39.0–52.0)
HEMOGLOBIN: 8.6 g/dL — AB (ref 13.0–17.0)
MCH: 25.1 pg — AB (ref 26.0–34.0)
MCHC: 29.9 g/dL — ABNORMAL LOW (ref 30.0–36.0)
MCV: 84.2 fL (ref 78.0–100.0)
PLATELETS: 410 10*3/uL — AB (ref 150–400)
RBC: 3.42 MIL/uL — AB (ref 4.22–5.81)
RDW: 14.2 % (ref 11.5–15.5)
WBC: 11 10*3/uL — ABNORMAL HIGH (ref 4.0–10.5)

## 2014-02-21 LAB — BASIC METABOLIC PANEL
ANION GAP: 6 (ref 5–15)
BUN: 17 mg/dL (ref 6–23)
CHLORIDE: 100 meq/L (ref 96–112)
CO2: 29 mmol/L (ref 19–32)
Calcium: 8.3 mg/dL — ABNORMAL LOW (ref 8.4–10.5)
Creatinine, Ser: 1.48 mg/dL — ABNORMAL HIGH (ref 0.50–1.35)
GFR, EST AFRICAN AMERICAN: 56 mL/min — AB (ref >90)
GFR, EST NON AFRICAN AMERICAN: 48 mL/min — AB (ref >90)
Glucose, Bld: 221 mg/dL — ABNORMAL HIGH (ref 70–99)
Potassium: 4.2 mmol/L (ref 3.5–5.1)
Sodium: 135 mmol/L (ref 135–145)

## 2014-02-21 LAB — GLUCOSE, CAPILLARY
Glucose-Capillary: 182 mg/dL — ABNORMAL HIGH (ref 70–99)
Glucose-Capillary: 135 mg/dL — ABNORMAL HIGH (ref 70–99)
Glucose-Capillary: 147 mg/dL — ABNORMAL HIGH (ref 70–99)
Glucose-Capillary: 143 mg/dL — ABNORMAL HIGH (ref 70–99)

## 2014-02-21 LAB — MRSA PCR SCREENING: MRSA BY PCR: NEGATIVE

## 2014-02-21 SURGERY — TRANSVERSE RECTUS ABDOMINIS MYOCUTANEOUS (TRAM)
Anesthesia: General | Site: Leg Upper | Laterality: Right

## 2014-02-21 MED ORDER — NEOSTIGMINE METHYLSULFATE 10 MG/10ML IV SOLN
INTRAVENOUS | Status: DC | PRN
  Administered 2014-02-21: 4 mg via INTRAVENOUS

## 2014-02-21 MED ORDER — FENTANYL CITRATE 0.05 MG/ML IJ SOLN
INTRAMUSCULAR | Status: DC | PRN
  Administered 2014-02-21: 50 ug via INTRAVENOUS
  Administered 2014-02-21: 100 ug via INTRAVENOUS

## 2014-02-21 MED ORDER — HYDROMORPHONE HCL 1 MG/ML IJ SOLN
INTRAMUSCULAR | Status: AC
  Administered 2014-02-21: 16:00:00
  Filled 2014-02-21: qty 1

## 2014-02-21 MED ORDER — ROCURONIUM BROMIDE 50 MG/5ML IV SOLN
INTRAVENOUS | Status: AC
  Filled 2014-02-21: qty 1

## 2014-02-21 MED ORDER — HYDROMORPHONE HCL 1 MG/ML IJ SOLN
0.2500 mg | INTRAMUSCULAR | Status: DC | PRN
  Administered 2014-02-21: 0.5 mg via INTRAVENOUS

## 2014-02-21 MED ORDER — LACTATED RINGERS IV SOLN
INTRAVENOUS | Status: DC
  Administered 2014-02-21: 09:00:00 via INTRAVENOUS

## 2014-02-21 MED ORDER — RIFAMPIN 300 MG PO CAPS
300.0000 mg | ORAL_CAPSULE | Freq: Two times a day (BID) | ORAL | Status: DC
  Administered 2014-02-21 – 2014-02-26 (×10): 300 mg via ORAL
  Filled 2014-02-21 (×11): qty 1

## 2014-02-21 MED ORDER — ROCURONIUM BROMIDE 100 MG/10ML IV SOLN
INTRAVENOUS | Status: DC | PRN
  Administered 2014-02-21: 10 mg via INTRAVENOUS
  Administered 2014-02-21: 50 mg via INTRAVENOUS

## 2014-02-21 MED ORDER — ALBUTEROL SULFATE HFA 108 (90 BASE) MCG/ACT IN AERS
INHALATION_SPRAY | RESPIRATORY_TRACT | Status: DC | PRN
  Administered 2014-02-21: 2 via RESPIRATORY_TRACT

## 2014-02-21 MED ORDER — GLYCOPYRROLATE 0.2 MG/ML IJ SOLN
INTRAMUSCULAR | Status: DC | PRN
  Administered 2014-02-21: 0.6 mg via INTRAVENOUS

## 2014-02-21 MED ORDER — PROPOFOL 10 MG/ML IV BOLUS
INTRAVENOUS | Status: AC
  Filled 2014-02-21: qty 20

## 2014-02-21 MED ORDER — LABETALOL HCL 5 MG/ML IV SOLN
5.0000 mg | INTRAVENOUS | Status: AC | PRN
  Administered 2014-02-21 (×4): 5 mg via INTRAVENOUS

## 2014-02-21 MED ORDER — ALTEPLASE 2 MG IJ SOLR
2.0000 mg | Freq: Once | INTRAMUSCULAR | Status: AC
  Administered 2014-02-21: 2 mg
  Filled 2014-02-21: qty 2

## 2014-02-21 MED ORDER — ONDANSETRON HCL 4 MG/2ML IJ SOLN
INTRAMUSCULAR | Status: DC | PRN
Start: 2014-02-21 — End: 2014-02-21
  Administered 2014-02-21: 4 mg via INTRAVENOUS

## 2014-02-21 MED ORDER — 0.9 % SODIUM CHLORIDE (POUR BTL) OPTIME
TOPICAL | Status: DC | PRN
  Administered 2014-02-21: 1000 mL

## 2014-02-21 MED ORDER — MIDAZOLAM HCL 5 MG/5ML IJ SOLN
INTRAMUSCULAR | Status: DC | PRN
  Administered 2014-02-21 (×2): .5 mg via INTRAVENOUS

## 2014-02-21 MED ORDER — ONDANSETRON HCL 4 MG/2ML IJ SOLN: 4.0000 mg | Freq: Four times a day (QID) | INTRAMUSCULAR | Status: DC | PRN

## 2014-02-21 MED ORDER — HYDRALAZINE HCL 20 MG/ML IJ SOLN
10.0000 mg | Freq: Four times a day (QID) | INTRAMUSCULAR | Status: DC | PRN
  Administered 2014-02-21 – 2014-03-03 (×5): 10 mg via INTRAVENOUS
  Filled 2014-02-21 (×7): qty 1

## 2014-02-21 MED ORDER — MIDAZOLAM HCL 2 MG/2ML IJ SOLN
INTRAMUSCULAR | Status: AC
  Filled 2014-02-21: qty 2

## 2014-02-21 MED ORDER — POLYMYXIN B SULFATE 500000 UNITS IJ SOLR
INTRAMUSCULAR | Status: DC | PRN
  Administered 2014-02-21: 500 mL

## 2014-02-21 MED ORDER — SUCCINYLCHOLINE CHLORIDE 20 MG/ML IJ SOLN
INTRAMUSCULAR | Status: AC
  Filled 2014-02-21: qty 1

## 2014-02-21 MED ORDER — LACTATED RINGERS IV SOLN
INTRAVENOUS | Status: DC | PRN
  Administered 2014-02-21: 09:00:00 via INTRAVENOUS

## 2014-02-21 MED ORDER — PROPOFOL 10 MG/ML IV BOLUS
INTRAVENOUS | Status: DC | PRN
  Administered 2014-02-21: 150 mg via INTRAVENOUS

## 2014-02-21 MED ORDER — LABETALOL HCL 5 MG/ML IV SOLN
INTRAVENOUS | Status: AC
  Filled 2014-02-21: qty 4

## 2014-02-21 MED ORDER — FENTANYL CITRATE 0.05 MG/ML IJ SOLN
INTRAMUSCULAR | Status: AC
  Filled 2014-02-21: qty 5

## 2014-02-21 SURGICAL SUPPLY — 63 items
ADH SKN CLS APL DERMABOND .7 (GAUZE/BANDAGES/DRESSINGS)
APPLIER CLIP 9.375 MED OPEN (MISCELLANEOUS) ×3
APR CLP MED 9.3 20 MLT OPN (MISCELLANEOUS) ×1
BAG DECANTER FOR FLEXI CONT (MISCELLANEOUS) ×3 IMPLANT
BINDER ABDOMINAL 12 ML 46-62 (SOFTGOODS) ×1 IMPLANT
BLADE SURG 10 STRL SS (BLADE) ×5 IMPLANT
BLADE SURG ROTATE 9660 (MISCELLANEOUS) IMPLANT
CANISTER SUCTION 2500CC (MISCELLANEOUS) ×3 IMPLANT
CLIP APPLIE 9.375 MED OPEN (MISCELLANEOUS) ×1 IMPLANT
CORDS BIPOLAR (ELECTRODE) IMPLANT
COVER SURGICAL LIGHT HANDLE (MISCELLANEOUS) ×5 IMPLANT
DERMABOND ADVANCED (GAUZE/BANDAGES/DRESSINGS)
DERMABOND ADVANCED .7 DNX12 (GAUZE/BANDAGES/DRESSINGS) IMPLANT
DRAIN CHANNEL 15F RND FF W/TCR (WOUND CARE) ×6 IMPLANT
DRAPE ORTHO SPLIT 77X108 STRL (DRAPES) ×6
DRAPE PROXIMA HALF (DRAPES) IMPLANT
DRAPE SURG ORHT 6 SPLT 77X108 (DRAPES) IMPLANT
DRAPE U-SHAPE 47X51 STRL (DRAPES) ×2 IMPLANT
DRSG TELFA 3X8 NADH (GAUZE/BANDAGES/DRESSINGS) ×3 IMPLANT
ELECT BLADE 6.5 EXT (BLADE) IMPLANT
ELECT CAUTERY BLADE 6.4 (BLADE) ×3 IMPLANT
ELECT COATED BLADE 2.86 ST (ELECTRODE) ×3 IMPLANT
ELECT REM PT RETURN 9FT ADLT (ELECTROSURGICAL) ×3
ELECTRODE REM PT RTRN 9FT ADLT (ELECTROSURGICAL) ×1 IMPLANT
EVACUATOR SILICONE 100CC (DRAIN) ×6 IMPLANT
GAUZE SPONGE 4X4 12PLY STRL (GAUZE/BANDAGES/DRESSINGS) ×2 IMPLANT
GLOVE BIO SURGEON STRL SZ 6 (GLOVE) ×3 IMPLANT
GLOVE BIOGEL PI IND STRL 7.0 (GLOVE) IMPLANT
GLOVE BIOGEL PI IND STRL 7.5 (GLOVE) IMPLANT
GLOVE BIOGEL PI INDICATOR 7.0 (GLOVE) ×2
GLOVE BIOGEL PI INDICATOR 7.5 (GLOVE) ×2
GLOVE SURG SS PI 6.0 STRL IVOR (GLOVE) ×3 IMPLANT
GLOVE SURG SS PI 7.0 STRL IVOR (GLOVE) ×2 IMPLANT
GOWN STRL REUS W/ TWL LRG LVL3 (GOWN DISPOSABLE) ×2 IMPLANT
GOWN STRL REUS W/TWL LRG LVL3 (GOWN DISPOSABLE) ×6
KIT BASIN OR (CUSTOM PROCEDURE TRAY) ×3 IMPLANT
NS IRRIG 1000ML POUR BTL (IV SOLUTION) ×3 IMPLANT
PACK GENERAL/GYN (CUSTOM PROCEDURE TRAY) ×3 IMPLANT
PAD ARMBOARD 7.5X6 YLW CONV (MISCELLANEOUS) ×6 IMPLANT
PAD DRESSING TELFA 3X8 NADH (GAUZE/BANDAGES/DRESSINGS) IMPLANT
SPONGE GAUZE 4X4 12PLY STER LF (GAUZE/BANDAGES/DRESSINGS) ×2 IMPLANT
SPONGE LAP 18X18 X RAY DECT (DISPOSABLE) ×4 IMPLANT
STAPLER PROXIMATE 75MM BLUE (STAPLE) ×1 IMPLANT
STAPLER VISISTAT 35W (STAPLE) ×5 IMPLANT
SUT ETHILON 2 0 FS 18 (SUTURE) ×4 IMPLANT
SUT MNCRL AB 4-0 PS2 18 (SUTURE) IMPLANT
SUT MON AB 5-0 PS2 18 (SUTURE) IMPLANT
SUT PDS AB 0 CT 36 (SUTURE) IMPLANT
SUT PDS AB 2-0 CT1 27 (SUTURE) ×6 IMPLANT
SUT PDS AB 3-0 SH 27 (SUTURE) ×2 IMPLANT
SUT PLAIN 5 0 P 3 18 (SUTURE) IMPLANT
SUT PROLENE 2 0 SH 30 (SUTURE) ×2 IMPLANT
SUT VIC AB 3-0 PS2 18 (SUTURE) ×9
SUT VIC AB 3-0 PS2 18XBRD (SUTURE) IMPLANT
SUT VIC AB 3-0 SH 27 (SUTURE)
SUT VIC AB 3-0 SH 27X BRD (SUTURE) IMPLANT
SUT VIC AB 4-0 PS2 27 (SUTURE) IMPLANT
SUT VLOC 180 0 24IN GS25 (SUTURE) IMPLANT
SYR BULB IRRIGATION 50ML (SYRINGE) ×3 IMPLANT
TAPE CLOTH SURG 4X10 WHT LF (GAUZE/BANDAGES/DRESSINGS) ×2 IMPLANT
TOWEL OR 17X24 6PK STRL BLUE (TOWEL DISPOSABLE) ×3 IMPLANT
TOWEL OR 17X26 10 PK STRL BLUE (TOWEL DISPOSABLE) ×3 IMPLANT
TRAY FOLEY CATH 14FRSI W/METER (CATHETERS) ×3 IMPLANT

## 2014-02-21 NOTE — Progress Notes (Signed)
Lunch relief.

## 2014-02-21 NOTE — Progress Notes (Signed)
Pt extubated by anesthesia.  Found pt on simple mask, sat 98%.  RN at bedside.

## 2014-02-21 NOTE — Op Note (Signed)
Operative Note   DATE OF OPERATION: 1.8.2016  [facility]  SURGICAL DIVISION: Plastic Surgery  PREOPERATIVE DIAGNOSES:  1. Infection vascular bypass graft 2.Open wound of groin, complicated   POSTOPERATIVE DIAGNOSES:  same  PROCEDURE:  1. Gracilis muscle flap to right groin  SURGEON: Collin FellowsBrinda Timotheus Salm MD MBA  ASSISTANT: none  ANESTHESIA:  General.   EBL: 25 ml  COMPLICATIONS: None.   INDICATIONS FOR PROCEDURE:  The patient, [name], is a 64 y.o. male born on 07/15/1950, is here for treatment of exposed vascular bypass graft right groin following debridement for MRSA infection.   FINDINGS: Gracilis muscle viable post transfer with palpable pulse in pedicle.  DESCRIPTION OF PROCEDURE:  The patient's operative site was marked with the patient in the preoperative area. The patient was taken to the operating room. SCDs were placed. Patient is in on IV Vancomycin for treatment of MRSA and received his scheduled does this morning. No additional dose was given. The patient's operative site was prepped and draped in a sterile fashion. A time out was performed and all information was confirmed to be correct. Incision marked over medial thigh posterior to adductor longus tendon. Incision carried through subcutaneous tissue and fascia over gracilis and adductor longus muscles incised. Adductor longus reflected medially and vascular pedicle identified. Patency of this verified by doppler exam. Minor pedicles distally divided. Separate incision made over distal thigh in area of prior scar. Subcutaneous tissue identified and gracilis tendon divided. Additional fascial attatchements divided so that tension free rotation into groin defect could be achieved.  Attention then directed to groin where all wound margins and granulation tissue excised to fresh tissue. The cavity was irrigated with bacitracin-polymyxin solution and hemostasis obtained. Subcutaneous tunnel from donor  site created and muscle rotated into groin. Muscle inset over exposed vascular graft with interrupted 2-0 PDS to superficial fascia and inguinal ligament.   15 Fr JP placed in subcutaneous position in both groin and thigh donor site and secured with 2-0 nylon. Wound closure completed with 2-0 PDS in superficial fascia and 3-0 vicryl in dermis. Staples used for final skin closure. Telfa, dry dressing applied.  The patient was taken to the recovery room in satisfactory condition, however remained intubated.   SPECIMENS: none  DRAINS: 15 Fr JP in right medial thigh donor site and right groin, subcutaneous  Collin FellowsBrinda Brixton Franko, MD [facility] Plastic & Reconstructive Surgery [phone]

## 2014-02-21 NOTE — Anesthesia Postprocedure Evaluation (Signed)
  Anesthesia Post-op Note  Patient: [name]  Procedure(s) Performed: Procedure(s): RIGHT GRACILIS MUSCLE FLAP TO RIGHT GROIN  (Right)  Patient Location: PACU  Anesthesia Type:General  Level of Consciousness: awake, alert  and oriented  Airway and Oxygen Therapy: Patient connected to face mask oxygen  Post-op Pain: mild  Post-op Assessment: Post-op Vital signs reviewed, Patient's Cardiovascular Status Stable and Respiratory Function Stable  Post-op Vital Signs: Reviewed and stable  Last Vitals:  Filed Vitals:   02/21/14 1330  BP: 172/86  Pulse: 70  Temp:   Resp: 17    Complications: No apparent anesthesia complications. Pt remained intubated from OR to PACU due to residual weakness.  This improved after 1 hr and pt was successfully extubated.

## 2014-02-21 NOTE — H&P (View-Only) (Signed)
Reason for Consult:possible muscle flap coverage of right groin exposed vascular graft Referring Physician: Dr. Kathy Breach [name] is an 64 y.o. male.  HPI: [name] is a 64 yo male with systemic vascular disease, DM, CVA, HTN, COPD who had undergone an aortobifemoral bypass remotely in 2007. He developed occlusion of the left limb and underwent revision of both limbs of the femoral bypass due to high grade stenosis in November. He was readmitted with chest pain by cardiology and fever. He was found to have an abscess of the right groin at this time and underwent I&D of the right groin area. Cultures grew moderate MRSA and he continues on Vanc and Cefepime and will require long term antibiotics per vascular given his prosthetic graft. He continues to have elevated troponin1, unstable angina, AKI, and anemia requiring transfusion. His respiratory failure appears to be improving.    The right groin wound measures 5.5 x 3 cm with a tunnel to 5.5 cm with exposed graft. The wound bed is clean with pink granulation present and scant serous drainage.  We are asked to evaluate for possible muscle flap coverage of the graft.   He has continued to have ongoing cardiac issues and ischemic work up is planned, but regardless, it is felt he will require further surgery, either a flap or possible amputation.   HbA1C in Novemebr 2015  Past Medical History  Diagnosis Date  . Arteriosclerotic cardiovascular disease (ASCVD)     CABG in 2003 for 3 VD; 07/2007: Stress nuclear-inferior infarction without ischemia; normal EF by echo in 06/2005  . Diabetes mellitus type II   . Hyperlipidemia   . Degenerative joint disease     Left knee  . Cerebrovascular disease 2003    Carotid ultrasound in 2010: Patent right CEA site, 60-79% left internal carotid artery  . Peripheral vascular disease 2007    aortobifemoral BPG-2007  . Anemia   . Peripheral neuropathy   . Hypertension   . Sick sinus syndrome 1996     permanent pacemaker-1996; generator change in 2010  . Chronic obstructive pulmonary disease   . Obesity   . Obstructive sleep apnea   . Benign prostatic hypertrophy   . Gastroesophageal reflux disease   . Tobacco abuse, in remission     50 pack years total consumption; Quit in 2007    Past Surgical History  Procedure Laterality Date  . Coronary artery bypass graft  2003    three-vessel disease  . Iliac artery stent      Percutaneous intervention-right external iliac; unclear whether stent was placed  . Pacemaker insertion  1996    generator change in 2010  . Carotid endarterectomy  2003    Right  . Pacemaker insertion      Generator replaced in 04/2008  . Aorta - bilateral femoral artery bypass graft  2007  . Femoral-popliteal bypass graft  1998    bilateral  . Colonoscopy  2012    Negative screening study.  . Ileocolonoscopy  02/20/2007    ERD:EYCXKG rectum, colon, and terminal ileum  . Femoral-femoral bypass graft Bilateral 01/03/2014    Procedure: BYPASS GRAFT RIGHT FEMORAL-LEFT FEMORAL ARTERY ;  Surgeon: Angelia Mould, MD;  Location: [place];  Service: Vascular;  Laterality: Bilateral;  . Endarterectomy femoral Left 01/03/2014    Procedure: ENDARTERECTOMY FEMORAL;  Surgeon: Angelia Mould, MD;  Location: [place];  Service: Vascular;  Laterality: Left;  . Intraoperative arteriogram Left 01/03/2014    Procedure: INTRA  OPERATIVE ARTERIOGRAM, LEFT LOWER LEG;  Surgeon: Angelia Mould, MD;  Location: [place];  Service: Vascular;  Laterality: Left;  . Abdominal angiogram N/A 01/02/2014    Procedure: ABDOMINAL ANGIOGRAM;  Surgeon: Conrad Porter, MD;  Location: [facility] CATH LAB;  Service: Cardiovascular;  Laterality: N/A;  . Peripheral vascular catheterization  01/02/2014    Procedure: LOWER EXTREMITY ANGIOGRAPHY;  Surgeon: Conrad [place], MD;  Location: [facility] CATH LAB;  Service: Cardiovascular;;  . Arch aortogram  01/02/2014    Procedure: ARCH AORTOGRAM;  Surgeon: Conrad Simms, MD;  Location[facility] CATH LAB;  Service: Cardiovascular;;  . Wound exploration Right 02/15/2014    Procedure: Incision and Drainage Right Groin;  Surgeon: Elam Dutch, MD;  Location: [facility];  Service: Vascular;  Laterality: Right;    Family History  Problem Relation Age of Onset  . Dementia Mother   . Heart attack Father   . Coronary artery disease Father   . Peripheral vascular disease Sister   . Diabetes Brother   . Peripheral vascular disease Brother   . Hypertension Brother   . Coronary artery disease Brother     Social History:  reports that he quit smoking about 9 years ago. His smoking use included Cigarettes. He has a 50 pack-year smoking history. He quit smokeless tobacco use about 18 years ago. He reports that he does not drink alcohol or use illicit drugs.  Allergies:  Allergies  Allergen Reactions  . Morphine And Related     Pt felt flushed and was diaphoretic     Medications: I have reviewed the patient's current medications.  Results for orders placed or performed during the hospital encounter of 02/11/14 (from the past 48 hour(s))  Basic metabolic panel     Status: Abnormal   Collection Time: 02/15/14  2:15 PM  Result Value Ref Range   Sodium 136 135 - 145 mmol/L    Comment: Please note change in reference range.   Potassium 4.2 3.5 - 5.1 mmol/L    Comment: Please note change in reference range.   Chloride 104 96 - 112 mEq/L   CO2 25 19 - 32 mmol/L   Glucose, Bld 115 (H) 70 - 99 mg/dL   BUN 17 6 - 23 mg/dL   Creatinine, Ser 1.30 0.50 - 1.35 mg/dL   Calcium 8.3 (L) 8.4 - 10.5 mg/dL   GFR calc non Af Amer 56 (L) >90 mL/min   GFR calc Af Amer 65 (L) >90 mL/min    Comment: (NOTE) The eGFR has been calculated using the CKD EPI equation. This calculation has not been validated in all clinical situations. eGFR's persistently <90 mL/min signify possible Chronic Kidney Disease.    Anion gap 7 5 - 15  Type and screen     Status: None   Collection Time:  02/15/14  2:15 PM  Result Value Ref Range   ABO/RH(D) B POS    Antibody Screen NEG    Sample Expiration 02/18/2014    Unit Number [id number]    Blood Component Type RED CELLS,LR    Unit division 00    Status of Unit ISSUED,FINAL    Transfusion Status OK TO TRANSFUSE    Crossmatch Result Compatible   Prepare RBC     Status: None   Collection Time: 02/15/14  2:15 PM  Result Value Ref Range   Order Confirmation ORDER PROCESSED BY BLOOD BANK   Glucose, capillary     Status: Abnormal   Collection Time: 02/15/14  4:07 PM  Result Value Ref Range   Glucose-Capillary 120 (H) 70 - 99 mg/dL   Comment 1 Documented in Chart    Comment 2 Notify RN   Glucose, capillary     Status: Abnormal   Collection Time: 02/15/14  7:52 PM  Result Value Ref Range   Glucose-Capillary 153 (H) 70 - 99 mg/dL   Comment 1 Notify RN   Glucose, capillary     Status: Abnormal   Collection Time: 02/15/14  9:35 PM  Result Value Ref Range   Glucose-Capillary 164 (H) 70 - 99 mg/dL   Comment 1 Notify RN   Blood gas, arterial     Status: Abnormal   Collection Time: 02/15/14 10:41 PM  Result Value Ref Range   O2 Content 2.0 L/min   Delivery systems NASAL CANNULA    pH, Arterial 7.387 7.350 - 7.450   pCO2 arterial 46.3 (H) 35.0 - 45.0 mmHg   pO2, Arterial 93.8 80.0 - 100.0 mmHg   Bicarbonate 27.4 (H) 20.0 - 24.0 mEq/L   TCO2 28.9 0 - 100 mmol/L   Acid-Base Excess 2.7 (H) 0.0 - 2.0 mmol/L   O2 Saturation 96.9 %   Patient temperature 97.6    Collection site BRACHIAL ARTERY    Drawn by [id number]    Sample type ARTERIAL DRAW    Allens test (pass/fail) PASS PASS  Magnesium     Status: None   Collection Time: 02/15/14 10:45 PM  Result Value Ref Range   Magnesium 1.8 1.5 - 2.5 mg/dL  CBC     Status: Abnormal   Collection Time: 02/16/14  3:50 AM  Result Value Ref Range   WBC 7.7 4.0 - 10.5 K/uL   RBC 3.53 (L) 4.22 - 5.81 MIL/uL   Hemoglobin 8.9 (L) 13.0 - 17.0 g/dL   HCT 29.5 (L) 39.0 - 52.0 %   MCV 83.6 78.0 -  100.0 fL   MCH 25.2 (L) 26.0 - 34.0 pg   MCHC 30.2 30.0 - 36.0 g/dL   RDW 14.8 11.5 - 15.5 %   Platelets 193 150 - 400 K/uL  Basic metabolic panel     Status: Abnormal   Collection Time: 02/16/14  3:50 AM  Result Value Ref Range   Sodium 136 135 - 145 mmol/L    Comment: Please note change in reference range.   Potassium 3.9 3.5 - 5.1 mmol/L    Comment: Please note change in reference range.   Chloride 101 96 - 112 mEq/L   CO2 31 19 - 32 mmol/L   Glucose, Bld 171 (H) 70 - 99 mg/dL   BUN 19 6 - 23 mg/dL   Creatinine, Ser 1.45 (H) 0.50 - 1.35 mg/dL   Calcium 8.5 8.4 - 10.5 mg/dL   GFR calc non Af Amer 49 (L) >90 mL/min   GFR calc Af Amer 57 (L) >90 mL/min    Comment: (NOTE) The eGFR has been calculated using the CKD EPI equation. This calculation has not been validated in all clinical situations. eGFR's persistently <90 mL/min signify possible Chronic Kidney Disease.    Anion gap 4 (L) 5 - 15  Glucose, capillary     Status: Abnormal   Collection Time: 02/16/14  8:23 AM  Result Value Ref Range   Glucose-Capillary 104 (H) 70 - 99 mg/dL   Comment 1 Documented in Chart    Comment 2 Notify RN   Glucose, capillary     Status: Abnormal   Collection Time: 02/16/14 12:21 PM  Result Value Ref Range   Glucose-Capillary 120 (H) 70 - 99 mg/dL   Comment 1 Documented in Chart    Comment 2 Notify RN   Glucose, capillary     Status: Abnormal   Collection Time: 02/16/14  4:14 PM  Result Value Ref Range   Glucose-Capillary 135 (H) 70 - 99 mg/dL  Glucose, capillary     Status: Abnormal   Collection Time: 02/16/14  9:14 PM  Result Value Ref Range   Glucose-Capillary 189 (H) 70 - 99 mg/dL  Basic metabolic panel     Status: Abnormal   Collection Time: 02/17/14  4:20 AM  Result Value Ref Range   Sodium 138 135 - 145 mmol/L    Comment: Please note change in reference range.   Potassium 3.9 3.5 - 5.1 mmol/L    Comment: Please note change in reference range.   Chloride 103 96 - 112 mEq/L    CO2 29 19 - 32 mmol/L   Glucose, Bld 178 (H) 70 - 99 mg/dL   BUN 15 6 - 23 mg/dL   Creatinine, Ser 1.37 (H) 0.50 - 1.35 mg/dL   Calcium 8.6 8.4 - 10.5 mg/dL   GFR calc non Af Amer 53 (L) >90 mL/min   GFR calc Af Amer 61 (L) >90 mL/min    Comment: (NOTE) The eGFR has been calculated using the CKD EPI equation. This calculation has not been validated in all clinical situations. eGFR's persistently <90 mL/min signify possible Chronic Kidney Disease.    Anion gap 6 5 - 15  CBC     Status: Abnormal   Collection Time: 02/17/14  4:20 AM  Result Value Ref Range   WBC 7.3 4.0 - 10.5 K/uL   RBC 3.51 (L) 4.22 - 5.81 MIL/uL   Hemoglobin 9.0 (L) 13.0 - 17.0 g/dL   HCT 29.6 (L) 39.0 - 52.0 %   MCV 84.3 78.0 - 100.0 fL   MCH 25.6 (L) 26.0 - 34.0 pg   MCHC 30.4 30.0 - 36.0 g/dL   RDW 14.7 11.5 - 15.5 %   Platelets 244 150 - 400 K/uL  Glucose, capillary     Status: Abnormal   Collection Time: 02/17/14  7:34 AM  Result Value Ref Range   Glucose-Capillary 145 (H) 70 - 99 mg/dL   Comment 1 Documented in Chart    Comment 2 Notify RN   Brain natriuretic peptide     Status: Abnormal   Collection Time: 02/17/14 10:30 AM  Result Value Ref Range   B Natriuretic Peptide 1410.9 (H) 0.0 - 100.0 pg/mL    Comment: Please note change in reference range.  Glucose, capillary     Status: Abnormal   Collection Time: 02/17/14 11:45 AM  Result Value Ref Range   Glucose-Capillary 145 (H) 70 - 99 mg/dL   Comment 1 Documented in Chart    Comment 2 Notify RN     Review of Systems  Eyes: Negative for photophobia.   Blood pressure 158/81, pulse 70, temperature 98.1 F (36.7 C), temperature source Oral, resp. rate 20, height $RemoveBe'5\' 5"'xzAhYMYaM$  (1.651 m), weight 88.2 kg (194 lb 7.1 oz), SpO2 94 %. Physical Exam  Constitutional: He is oriented to person, place, and time. He appears well-developed and well-nourished. No distress.  HENT:  Head: Normocephalic and atraumatic.  Respiratory: Effort normal. No respiratory  distress.  Musculoskeletal:  The right groin wound measures 5.5 x 3 cm with a tunnel to 5.5 cm with exposed graft. The wound bed is  clean with pink granulation present and scant serous drainage.   Neurological: He is alert and oriented to person, place, and time.  Skin: Skin is warm and dry. No rash noted. No erythema.  Psychiatric: He has a normal mood and affect. His behavior is normal. Judgment and thought content normal.  significant scrotal edema R thigh with multiple scars over medial thigh from prior vascular interventions ABD soft with midline abdominal scar, protuberant, no hernias noted  Assessment/Plan: S/P Revision of femoral grafts with right groin MRSA abscess and exposed graft- Dr. Iran Planas will see later today to evaluate for possible muscle flap coverage of graft.   RAYBURN,SHAWN,PA-C Plastic Surgery [phone]  Patient seen and examined. Wound clean and partially granulated with palpable graft at base.  Counseled pt that current setting of exposed graft puts him at high risk of further infection, bleeding complications. Could try to heal secondarily but again shares these risks. Reviewed options of muscle coverage of graft; muscle coverage would also help clear infection by bringing in vascularized tissue. Sartorius not option given known occlusion SFA. Gracilis and rectus abdominus both options. Review of 12/2013 angio report and recent CT pelvis with apparent patent profundus and medial circumflex branch. In this patient with protuberant abdomen and poor diabetes control, would elect for gracilis first. However, given his multiple intervention in past, hopefully branch to muscle has not been disturbed or occluded. Reviewed with family that this will be a few hours of general anesthetic, may utilize VAC over flap/incision, will have scar along medial thigh, drains. Reviewed time in hospital post procedure will depend on healing, pain. We discussed that if this flap fails or  additional wound healing problems, infection, may require removal graft and amputation.   Will try to schedule by end of week, will need to clarify if Cardiology rec completion of additional testing prior to another long anesthesia.   Irene Limbo, MD [facility] Plastic & Reconstructive Surgery [phone]

## 2014-02-21 NOTE — Interval H&P Note (Signed)
History and Physical Interval Note:  02/21/2014 8:38 AM  [name]  has presented today for surgery, with the diagnosis of OPEN WOUND OF GROIN  WITH COMPLICATIONS,EXPOSED VASICULAR GRAFT, PERIPHERAL VASICULAR DISEASE  The various methods of treatment have been discussed with the patient and family. After consideration of risks, benefits and other options for treatment, the patient has consented to  Procedure(s): RIGHT GRACILIS MUSCLE FLAP TO RIGHT GROIN WITH POSSIBLE  VAC PLACEMENT (Right) as a surgical intervention .  The patient's history has been reviewed, patient examined, no change in status, stable for surgery.  I have reviewed the patient's chart and labs.  Questions were answered to the patient's satisfaction.     Michael Walrath

## 2014-02-21 NOTE — Progress Notes (Signed)
Chart reviewed. Patient in surgery.  Crista Curborinna Francetta Ilg, MD

## 2014-02-21 NOTE — Transfer of Care (Signed)
Immediate Anesthesia Transfer of Care Note  Patient: Collin LeitzWilliam F [name]  Procedure(s) Performed: Procedure(s): RIGHT GRACILIS MUSCLE FLAP TO RIGHT GROIN  (Right)  Patient Location: PACU  Anesthesia Type:General  Level of Consciousness: awake, alert , lethargic and Patient remains intubated per anesthesia plan  Airway & Oxygen Therapy: Patient remains intubated per anesthesia plan  Post-op Assessment: Post -op Vital signs reviewed and stable  Post vital signs: stable  Complications: No apparent anesthesia complications

## 2014-02-21 NOTE — Anesthesia Preprocedure Evaluation (Addendum)
Anesthesia Evaluation  Patient identified by MRN, date of birth, ID band Patient awake    Reviewed: Allergy & Precautions, NPO status , Patient's Chart, lab work & pertinent test results, reviewed documented beta blocker date and time   Airway Mallampati: II   Neck ROM: full    Dental  (+) Edentulous Upper, Edentulous Lower   Pulmonary sleep apnea , COPD COPD inhaler, former smoker,  breath sounds clear to auscultation        Cardiovascular hypertension, Pt. on home beta blockers and Pt. on medications + angina with exertion + CAD, + CABG and + Peripheral Vascular Disease + pacemaker - Valvular Problems/MurmursRhythm:Regular  Cath (02/20/14): The patient has patency of all bypass grafts. He should be at low risk of major ischemic events related to surgery. No further cardiac testing indicated at this point. Will continue with medical therapy as tolerated.   Neuro/Psych  Neuromuscular disease    GI/Hepatic GERD-  Medicated and Controlled,  Endo/Other  diabetes, Type 2obese  Renal/GU Renal InsufficiencyRenal disease     Musculoskeletal  (+) Arthritis -,   Abdominal (+)  Abdomen: soft. Bowel sounds: normal.  Peds  Hematology  (+) anemia ,   Anesthesia Other Findings   Reproductive/Obstetrics                        Anesthesia Physical Anesthesia Plan  ASA: III  Anesthesia Plan: General   Post-op Pain Management:    Induction: Intravenous  Airway Management Planned: Oral ETT  Additional Equipment:   Intra-op Plan:   Post-operative Plan: Extubation in OR  Informed Consent: I have reviewed the patients History and Physical, chart, labs and discussed the procedure including the risks, benefits and alternatives for the proposed anesthesia with the patient or authorized representative who has indicated his/her understanding and acceptance.     Plan Discussed with: CRNA, Anesthesiologist and  Surgeon  Anesthesia Plan Comments:        Anesthesia Quick Evaluation

## 2014-02-21 NOTE — Progress Notes (Signed)
Subjective: No chest pain, no SOB  Objective: Vital signs in last 24 hours: Temp:  [98.6 F (37 C)-100.2 F (37.9 C)] 100.2 F (37.9 C) (01/08 0456) Pulse Rate:  [68-72] 72 (01/08 0456) Resp:  [15-30] 18 (01/08 0456) BP: (149-225)/(54-101) 161/70 mmHg (01/08 0456) SpO2:  [85 %-100 %] 94 % (01/08 0456) Weight change:  Last BM Date: 02/19/14 Intake/Output from previous day: -495 01/07 0701 - 01/08 0700 In: 1605.8 [P.O.:482; I.V.:823.8; IV Piggyback:300] Out: 600 [Urine:600] Intake/Output this shift:    PE: General:Pleasant affect, NAD Skin:Warm and dry, brisk capillary refill HEENT:normocephalic, sclera clear, mucus membranes moist Heart:S1S2 RRR without murmur, gallup, rub or click Lungs:clear without rales, rhonchi, or wheezes ZOX:WRUEAbd:soft, non tender, + BS, do not palpate liver sple en or masses Ext:no lower ext edema,Rt groin dressing in place,   Lab Results:  Recent Labs  02/20/14 0500 02/21/14 0350  WBC 11.2* 11.0*  HGB 8.7* 8.6*  HCT 29.0* 28.8*  PLT 420* 410*   BMET  Recent Labs  02/20/14 0500 02/21/14 0350  NA 138 135  K 4.1 4.2  CL 101 100  CO2 33* 29  GLUCOSE 160* 221*  BUN 13 17  CREATININE 1.25 1.48*  CALCIUM 8.4 8.3*   No results for input(s): TROPONINI in the last 72 hours.  Invalid input(s): CK, MB  Lab Results  Component Value Date   CHOL 125 12/03/2013   HDL 23* 12/03/2013   LDLCALC 39 12/03/2013   LDLDIRECT 77 09/17/2012   TRIG 317* 12/03/2013   CHOLHDL 5.4 12/03/2013   Lab Results  Component Value Date   HGBA1C 11.1* 12/31/2013     Lab Results  Component Value Date   TSH 1.341 02/11/2014     Studies/Results: Nm Myocar Multi W/spect W/wall Motion / Ef  02/19/2014   CLINICAL DATA:  Chest pain.  Prior CABG.  Hypertension.  Smoker.  EXAM: MYOCARDIAL IMAGING WITH SPECT (REST AND PHARMACOLOGIC-STRESS)  GATED LEFT VENTRICULAR WALL MOTION STUDY  LEFT VENTRICULAR EJECTION FRACTION  TECHNIQUE: Standard myocardial  SPECT imaging was performed after resting intravenous injection of 10 mCi Tc-3175m sestamibi. Subsequently, intravenous infusion of Lexiscan was performed under the supervision of the Cardiology staff. At peak effect of the drug, 30 mCi Tc-2475m sestamibi was injected intravenously and standard myocardial SPECT imaging was performed. Quantitative gated imaging was also performed to evaluate left ventricular wall motion, and estimate left ventricular ejection fraction.  COMPARISON:  08/02/2007  FINDINGS: Perfusion: Mild fixed defect involving the inferior wall. Possibly related to diaphragmatic attenuation.  A rest area of decreased activity involves the mid anterior wall, mild. There is an area of mild reversibility, which is medium in size. This involves the apical to mid segment of the anterior and anterior lateral wall.  Wall Motion: Global hypokinesis. More focal hypokinesis to dyskinesis involving the lateral left ventricular wall.  Left Ventricular Ejection Fraction: 44 %  End diastolic volume 130 ml  End systolic volume 73 ml  IMPRESSION: 1. Medium-sized area of mild reversibility involving the anterior and anterior lateral walls. Suspicious for inducible ischemia.  2. Global hypokinesis with lateral wall focal hypokinesis to dyskinesis.  3. Left ventricular ejection fraction 44%  4. Intermediate-risk stress test findings*.  *2012 Appropriate Use Criteria for Coronary Revascularization Focused Update: J Am Coll Cardiol. 2012;59(9):857-881. http://content.dementiazones.comonlinejacc.org/article.aspx?articleid=1201161   Electronically Signed   By: Jeronimo GreavesKyle  Talbot M.D.   On: 02/19/2014 16:26    Medications: I have reviewed the patient's current medications. Scheduled  Meds: . antiseptic oral rinse  7 mL Mouth Rinse BID  . aspirin  81 mg Oral Daily  . aspirin  81 mg Oral Pre-Cath  . atorvastatin  80 mg Oral q1800  . Chlorhexidine Gluconate Cloth  6 each Topical Q0600  . cloNIDine  0.1 mg Transdermal Weekly  . ferrous sulfate   325 mg Oral BID  . gabapentin  300 mg Oral TID  . hydrALAZINE  25 mg Oral 3 times per day  . insulin aspart  0-20 Units Subcutaneous TID WC  . insulin aspart  0-5 Units Subcutaneous QHS  . insulin detemir  25 Units Subcutaneous Q2200  . isosorbide mononitrate  30 mg Oral Daily  . metoprolol  25 mg Oral BID  . mupirocin ointment  1 application Nasal BID  . sodium chloride  10-40 mL Intracatheter Q12H  . sodium chloride  3 mL Intravenous Q12H  . sodium chloride  3 mL Intravenous Q12H  . triamcinolone cream  1 application Topical BID  . vancomycin  750 mg Intravenous Q12H   Continuous Infusions: . sodium chloride 250 mL (02/20/14 1045)  . sodium chloride 75 mL/hr at 02/21/14 0025   PRN Meds:.acetaminophen, hydrALAZINE, ipratropium-albuterol, ondansetron **OR** ondansetron (ZOFRAN) IV, sodium chloride, sodium chloride, traMADol    Nuc study: IMPRESSION: 1. Medium-sized area of mild reversibility involving the anterior and anterior lateral walls. Suspicious for inducible ischemia. 2. Global hypokinesis with lateral wall focal hypokinesis to dyskinesis. 3. Left ventricular ejection fraction 44% 4. Intermediate-risk stress test findings.  LHC 02/20/13 Final Conclusions:  1. 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 Status post four-vessel CABG with continued patency of all grafts (LIMA to LAD, saphenous vein graft to PDA, saphenous vein graft sequential to OM1 and OM 2)  Recommendations: The patient has patency of all bypass grafts. He should be at low risk of major ischemic events related to surgery. No further cardiac testing indicated at this point. Will continue with medical therapy as tolerated.   Assessment/Plan: [name] is a 64 y.o. male with a history of COPD, CAD s/p CABG (2003), DM 2, PVD, OSA , SSS s/p pacer who was transferred from [facility] on 02/12/14 with acute respiratory failure. He had a recent  aorta femoral bypass and was found to have an abscess in his groin s/p I&D of the abscess by Dr. Darrick Penna on 02/15/13. Cardiology was consulted for elevated troponin.  EF 60-65% on echo, mild aortic stenosis. PA pk pressure was 59 mmHg.  Now with intermediate-risk for ischemia with troponin of 8.    Elevated troponin: Poss demand ischemia in setting of CAD and sepsis Troponin is trending down 8.01--> 3.07--> 2.31--> 1.32.-- Echo 02/12/14 with normal LV function.  -- Steffanie Dunn on 02/19/14 abnormal and referred for cardiac cath prior to vascular surgery. This showed patency of his bypass grafts and he was felt to be low risk for major ischemic events during surgery. (cath report above)  -- Continue ASA, statin, beta blocker, Imdur.   Anemia- H/H stable at 8.6/29.9  Acute kidney injury- creat improving, but now bumped again slightly. 1.25--> 1.48. Continue to monitor s/p contrast dye exposure from cardiac cath.  Acute respiratory failure- Now resolved  Sepsis/Due groin abscess with infected graft: -ABx  Surgery today    HTN- BP still with spikes and generally not well controlled.  -- Continue lopressor  BID, imdur . Clonidine patch 0.1mg , and hydralazine  TID. Follow post op  Abnormal CT abdomen- 3. 13 mm hyperdense cortical lesion of the mid lateral left renal cortex. Followup is recommended. -- Per IM  SSS s/p PPM- patinet with episode of what appears to be pacemaker mediated tachycardia -- Pacemaker interrogation, [employer] 14 episodes of increased ventricular rate since 2014.  1:1 AT 01/24/14, retrograde conduction, PVARP programmed to 275.  DM- HgA1c 11.1. per IM   LOS: 10 days   Time spent with pt. :15 minutes. Cline Crock R  PA-C 02/21/2014, 7:51 AM  Patient seen  Agree with findings as noted boy K Janee Morn above.  I have amended note to reflect my findings    Surgery today.    Dietrich Pates

## 2014-02-21 NOTE — Anesthesia Procedure Notes (Addendum)
Procedure Name: Intubation Date/Time: 02/21/2014 9:20 AM Performed by: Ellin GoodieWEAVER, Ellan Tess M Pre-anesthesia Checklist: Patient identified, Emergency Drugs available, Suction available, Patient being monitored and Timeout performed Patient Re-evaluated:Patient Re-evaluated prior to inductionOxygen Delivery Method: Circle system utilized Preoxygenation: Pre-oxygenation with 100% oxygen Intubation Type: IV induction Ventilation: Mask ventilation without difficulty and Oral airway inserted - appropriate to patient size Laryngoscope Size: Mac and 3 Grade View: Grade III Tube type: Oral Tube size: 7.5 mm Number of attempts: 1 Airway Equipment and Method: Stylet Placement Confirmation: ETT inserted through vocal cords under direct vision,  positive ETCO2 and breath sounds checked- equal and bilateral Secured at: 22 cm Tube secured with: Tape Dental Injury: Teeth and Oropharynx as per pre-operative assessment  Comments: Easy atraumatic induction, oral airway inserted to assist with ventilation, DL x 1 with Grade III view, ETT passed easily.  Dr. Chaney MallingHodierne verified placement.  Carlynn HeraldH Weaver, CRNA

## 2014-02-21 NOTE — Progress Notes (Signed)
Pt manual bp 196/86, MD made aware. MD ordered 10 mg of hydralazine. Will continue to monitor pt.

## 2014-02-21 NOTE — Progress Notes (Signed)
VASCULAR SURGERY:  Appreciate Dr. Maude Lerichehimmappa's help. The pt is in PACU s/p gracilis muscle flap. I will check back Monday. If any problems over the weekend, Dr. Arbie CookeyEarly is on call [phone]).  Waverly Ferrarihristopher Willow Reczek, MD, FACS Beeper [phone] 02/21/2014

## 2014-02-22 DIAGNOSIS — A4902 Methicillin resistant Staphylococcus aureus infection, unspecified site: Secondary | ICD-10-CM

## 2014-02-22 DIAGNOSIS — D72829 Elevated white blood cell count, unspecified: Secondary | ICD-10-CM | POA: Insufficient documentation

## 2014-02-22 DIAGNOSIS — I248 Other forms of acute ischemic heart disease: Secondary | ICD-10-CM

## 2014-02-22 DIAGNOSIS — T827XXS Infection and inflammatory reaction due to other cardiac and vascular devices, implants and grafts, sequela: Secondary | ICD-10-CM

## 2014-02-22 DIAGNOSIS — Z950 Presence of cardiac pacemaker: Secondary | ICD-10-CM

## 2014-02-22 DIAGNOSIS — E785 Hyperlipidemia, unspecified: Secondary | ICD-10-CM

## 2014-02-22 DIAGNOSIS — D508 Other iron deficiency anemias: Secondary | ICD-10-CM

## 2014-02-22 DIAGNOSIS — J438 Other emphysema: Secondary | ICD-10-CM

## 2014-02-22 LAB — GLUCOSE, CAPILLARY
Glucose-Capillary: 156 mg/dL — ABNORMAL HIGH (ref 70–99)
Glucose-Capillary: 173 mg/dL — ABNORMAL HIGH (ref 70–99)
Glucose-Capillary: 158 mg/dL — ABNORMAL HIGH (ref 70–99)

## 2014-02-22 MED ORDER — HYDRALAZINE HCL 50 MG PO TABS
50.0000 mg | ORAL_TABLET | Freq: Three times a day (TID) | ORAL | Status: DC
  Administered 2014-02-22 – 2014-03-04 (×30): 50 mg via ORAL
  Filled 2014-02-22 (×35): qty 1

## 2014-02-22 MED ORDER — HYDRALAZINE HCL 25 MG PO TABS
25.0000 mg | ORAL_TABLET | Freq: Once | ORAL | Status: AC
  Administered 2014-02-22: 25 mg via ORAL

## 2014-02-22 NOTE — Progress Notes (Signed)
[facility] PROGRESS NOTE Interim history: 64 year old male with past medical history a recent aorta femoral bypass, who came in with acute hypoxic respiratory failure, sepsis and elevated cardiac enzymes. Started on empiric antibiotics. CT imaging of the Chest was done in ED showed no PE. He also developed acute kidney injury probably due to sepsis, ACE inhibitor and contrast-induced nephropathy which resolved. Groin ultrasound showed abscess, vascular surgery consulted recommended a CT scan of the abdomen and pelvis with contrast emergently on 02/14/2014 which showed multiple loculated abscesses and possible infected native graft, he is  status post I&D and possible graft infection on 02/15/2014. Blood cultures negative but abscess cultures are growing MRSA, continued on vancomycin and cefepime was discontinued.  He will need an ischemic evaluation.  He was transfused 1 unit of PRBC.  Given clinical improvement, he was transferred out of stepdown to telemetry on 02/17/2014.  Plastic surgery was consulted by vascular surgery, anticipate surgery later this week after myocardial perfusion scan and depending on cardiac recommendations.   Filed Weights   02/11/14 1328 02/11/14 2059 02/12/14 0535  Weight: 89.812 kg (198 lb) 90 kg (198 lb 6.6 oz) 88.2 kg (194 lb 7.1 oz)        Intake/Output Summary (Last 24 hours) at 02/22/14 1035 Last data filed at 02/22/14 0914  Gross per 24 hour  Intake   3495 ml  Output    395 ml  Net   3100 ml     Assessment/Plan: Sepsis/Due to R groin abscess with infected graft: - Cultures growing MRSA, continue on vancomycin.  - Ct angio of graft showed multiple abscess and possible infected native graft. Vascular and Plastic Surgery, onboard.  S/p surgery 1/8 - Blood cultures negative and final. - S/p I&D of groin abscess, cultures from 02/15/2013 MRSA. - Vascular would like to change the dressing due to the high risk of bleeding. - ID to help with abx  Acute  respiratory failure with hypoxia - Likely due to sepsis of the groin and demand ischemia.  Resolved.  AKI (acute kidney injury)/acute toxic nephropathy: - Multifactorial due to sepsis, ACE inhibitor and contrast-induced nephropathy. ACE held. - improved. Creatinine has been stable over the last 3 days in the 1.2 range.  Elevated Troponin's: - Cont aspirin, and beta blockers. Cardiology consulted -Abnormal stress test followed by cardiac cath 1/7 showed patent all bypass grafts and cardiology states low risk of major ischemic events related to surgery and no further cardiac testing at this point.  Hyponatremia - Resolved with hydration.  Chronic obstructive pulmonary disease - Stable.  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  Essential hypertension - Mildly uncontrolled and fluctuating.  -hydralazine added 1/9  Normocytic anemia: - S/p 2 units of packed red blood cells. - Try to keep Hbg closer to 9 due to the possibility of ischemic heart disease- check Hgb today - Stable.   Code Status: Full code  DVT Prophylaxis: SCDs. Family Communication: patient Disposition Plan: Not medically stable for discharge.    Consultants:  Cardiology  Vascular Surgery  Plastic Surgery  ID  Procedures: ECHO: EF normal no wall motion. Pelvic US 12.30.2015: Large 11.4 x 10.0 x 4.6 cm right groin complex fluid collection noted  LHC 02/20/14  Antibiotics:  Vancomycin 02/11/2014   Cefepime 02/11/2014 -> 02/17/2013.  HPI/Subjective: No SOB, no CP  Objective: Filed Vitals:   02/21/14 2135 02/22/14 0030 02/22/14 0148 02/22/14 0544  BP: 195/81 183/85  167/60 170/67  Pulse: 71 70 70 69  Temp: 98.6 F (37 C)   99.6 F (37.6 C)  TempSrc: Oral   Oral  Resp: 18   20  Height:      Weight:      SpO2: 97%  98% 95%     Exam:  Physical Exam: General: Awake, Oriented, No acute distress. HEENT:  EOMI. Neck: Supple CV: S1 and S2, RRR. No JVD or murmurs. Telemetry: AV paced rhythm. Lungs: Clear to ascultation bilaterally Abdomen: Soft, Nontender, Nondistended, +bowel sounds. Ext: Good pulses. Trace edema. Skin: dressing of right groin clean and dry.   Data Reviewed: Basic Metabolic Panel:  Recent Labs Lab 02/15/14 2245 02/16/14 0350 02/17/14 0420 02/18/14 0450 02/19/14 0508 02/20/14 0500 02/21/14 0350  NA  --  136 138  --  137 138 135  K  --  3.9 3.9  --  3.8 4.1 4.2  CL  --  101 103  --  101 101 100  CO2  --  31 29  --  32 33* 29  GLUCOSE  --  171* 178*  --  150* 160* 221*  BUN  --  19 15  --  CREATININE  --  1.45* 1.37* 1.21 1.21 1.25 1.48*  CALCIUM  --  8.5 8.6  --  8.6 8.4 8.3*  MG 1.8  --   --   --   --   --   --    Liver Function Tests: No results for input(s): AST, ALT, ALKPHOS, BILITOT, PROT, ALBUMIN in the last 168 hours. No results for input(s): LIPASE, AMYLASE in the last 168 hours. No results for input(s): AMMONIA in the last 168 hours. CBC:  Recent Labs Lab 02/17/14 0420 02/18/14 0450 02/19/14 0508 02/20/14 0500 02/21/14 0350  WBC 7.3 7.9 10.5 11.2* 11.0*  HGB 9.0* 9.4* 9.0* 8.7* 8.6*  HCT 29.6* 30.1* 29.7* 29.0* 28.8*  MCV 84.3 86.0 84.6 84.8 84.2  PLT 244 325 386 420* 410*   Cardiac Enzymes: No results for input(s): CKTOTAL, CKMB, CKMBINDEX, TROPONINI in the last 168 hours. BNP (last 3 results) No results for input(s): PROBNP in the last 8760 hours. CBG:  Recent Labs Lab 02/21/14 0747 02/21/14 1237 02/21/14 1512 02/21/14 2135 02/22/14 0749  GLUCAP 182* 135* 147* 143* 156*    Recent Results (from the past 240 hour(s))  Wound culture     Status: None   Collection Time: 02/14/14 10:08 AM  Result Value Ref Range Status   Specimen Description WOUND GROIN RIGHT  Final   Special Requests NONE  Final   Gram Stain   Final    NO WBC SEEN NO SQUAMOUS EPITHELIAL CELLS SEEN NO ORGANISMS SEEN Performed at [facility]    Culture   Final    NO GROWTH 3 DAYS Performed at [facility]    Report Status 02/17/2014 FINAL  Final  Body fluid culture     Status: None   Collection Time: 02/15/14  9:49 AM  Result Value Ref Range Status   Specimen Description FLUID RIGHT DRAINAGE GROIN  Final   Special Requests NONE  Final   Gram Stain   Final    ABUNDANT WBC PRESENT, PREDOMINANTLY PMN RARE GRAM POSITIVE COCCI IN PAIRS Performed at [facility]    Culture   Final    MODERATE METHICILLIN RESISTANT STAPHYLOCOCCUS AUREUS Note: RIFAMPIN AND GENTAMICIN SHOULD NOT BE USED AS SINGLE DRUGS FOR TREATMENT OF STAPH INFECTIONS. This  organism DOES NOT demonstrate inducible Clindamycin resistance in vitro. CRITICAL RESULT CALLED TO, READ BACK BY AND VERIFIED WITH: CAROL@8 :58AM ON  02/17/13 BY DANTS [facility]    Report Status 02/17/2014 FINAL  Final   Organism ID, Bacteria METHICILLIN RESISTANT STAPHYLOCOCCUS AUREUS  Final      Susceptibility   Methicillin resistant staphylococcus aureus - MIC*    CLINDAMYCIN <=0.25 SENSITIVE Sensitive     ERYTHROMYCIN >=8 RESISTANT Resistant     GENTAMICIN <=0.5 SENSITIVE Sensitive     LEVOFLOXACIN >=8 RESISTANT Resistant     OXACILLIN >=4 RESISTANT Resistant     PENICILLIN >=0.5 RESISTANT Resistant     RIFAMPIN <=0.5 SENSITIVE Sensitive     TRIMETH/SULFA >=320 RESISTANT Resistant     VANCOMYCIN 1 SENSITIVE Sensitive     TETRACYCLINE <=1 SENSITIVE Sensitive     * MODERATE METHICILLIN RESISTANT STAPHYLOCOCCUS AUREUS  MRSA PCR Screening     Status: None   Collection Time: 02/21/14  4:11 AM  Result Value Ref Range Status   MRSA by PCR NEGATIVE NEGATIVE Final    Comment:        The GeneXpert MRSA Assay (FDA approved for NASAL specimens only), is one component of a comprehensive MRSA colonization surveillance program. It is not intended to diagnose MRSA infection nor to guide or monitor treatment for MRSA infections.       Studies: No results found.  Scheduled Meds: . antiseptic oral rinse  7 mL Mouth Rinse BID  . aspirin  81 mg Oral Daily  . atorvastatin  80 mg Oral q1800  . Chlorhexidine Gluconate Cloth  6 each Topical Q0600  . cloNIDine  0.1 mg Transdermal Weekly  . ferrous sulfate  325 mg Oral BID  . gabapentin  300 mg Oral TID  . hydrALAZINE  25 mg Oral Once  . hydrALAZINE  50 mg Oral 3 times per day  . insulin aspart  0-20 Units Subcutaneous TID WC  . insulin aspart  0-5 Units Subcutaneous QHS  . insulin detemir  25 Units Subcutaneous Q2200  . isosorbide mononitrate  30 mg Oral Daily  . metoprolol  25 mg Oral BID  . mupirocin ointment  1 application Nasal BID  . rifampin  300 mg Oral Q12H  . triamcinolone cream  1 application Topical BID  . vancomycin  750 mg Intravenous Q12H   Continuous Infusions: . sodium chloride 75 mL/hr at 02/22/14 0601    Time: 25 minutes.  Chalmers CaterVANN, Collin Bob,DO [facility] Pager [phone] If 7PM-7AM, please contact night-coverage [email or web address] Password [email or web address] 02/22/2014, 10:35 AM      LOS: 11 days

## 2014-02-22 NOTE — Progress Notes (Signed)
Removed pt foley catheter, patient requested for foley to removed, pt stated he could use the urinal to use the bathroom.

## 2014-02-22 NOTE — Progress Notes (Signed)
POD#1 gracilis muscle flap to right groin  Foley removed by staff this am per pt request- this despite pt incontinent on himself and groin dressing prior to surgery  Temp:  [98.6 F (37 C)-99.6 F (37.6 C)] 99.6 F (37.6 C) (01/09 0544) Pulse Rate:  [69-72] 69 (01/09 0544) Resp:  [10-20] 20 (01/09 0544) BP: (133-195)/(60-91) 170/67 mmHg (01/09 0544) SpO2:  [94 %-100 %] 95 % (01/09 0544) FiO2 (%):  [60 %] 60 % (01/08 1215)   JP 110/175  Hb 8.6 - per IM notes goal to keep around 9, will defer to IM and Cardiology whether any further transfusions required C/o 7/10 pain, just took med  PE Right thigh dressings dry. JPs sanguinous R thigh edematous, no hematoma Abdomen distentded  A/P Ok to be OOB- PT eval Drains need to be stripped, need to secure to gown when ambulating Keep incisions covered, dry. Will remove tomorrow.  I spoke with ID and they will assess pt for antibiotic course.  Glenna FellowsBrinda Navaya Wiatrek, MD [facility] Plastic & Reconstructive Surgery [phone]

## 2014-02-22 NOTE — Consult Note (Addendum)
[facility]    Date of Admission:  02/11/2014           Day 11 vancomycin        Day 2 rifampin       Reason for Consult: MRSA right femoral graft infection    Referring Physician: Dr. 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 antiseptic oral rinse  7 mL Mouth Rinse BID  . aspirin  81 mg Oral Daily  . atorvastatin  80 mg Oral q1800  . Chlorhexidine Gluconate Cloth  6 each Topical Q0600  . cloNIDine  0.1 mg Transdermal Weekly  . ferrous sulfate  325 mg Oral BID  . gabapentin  300 mg Oral TID  . hydrALAZINE  50 mg Oral 3 times per day  . insulin aspart  0-20 Units Subcutaneous TID WC  . insulin aspart  0-5 Units Subcutaneous QHS  . insulin detemir  25 Units Subcutaneous Q2200  . isosorbide mononitrate  30 mg Oral Daily  . metoprolol  25 mg Oral BID  . mupirocin ointment  1 application Nasal BID  . rifampin  300 mg Oral Q12H  . triamcinolone cream  1 application Topical BID  . vancomycin  750 mg Intravenous Q12H    Recommendations: 1. Continue vancomycin and rifampin for at least 6 weeks  2. We will follow-up on Monday, 02/24/2014  Assessment: I agree with IV vancomycin and oral rifampin for his MRSA peri-graft infection. I would plan on at least 6 weeks of IV based therapy and then consider conversion to a full oral regimen.    HPI: [name] is a 64 y.o. male underwent  aortobifemoral grafting in 2007. Last November he underwent right femoral artery cannulation for aortogram followed bythrombectomy of the left femoral limb. Postoperatively he had some scrotal swelling and some difficulty healing his right femoral cannulation site with some thin drainage. He developed sudden onset of fever associated with some pain and swelling in his right groin leading to admission on 02/12/2014. His initial fever was 104.4. He was started on empiric antibiotics. Admission blood cultures were negative. An ultrasound of his right groin showed a fluid collection and he underwent incision and drainage on 02/15/2014 revealing an abscess sitting on top of the right femoral graft. Abscess cultures grew MRSA. Yesterday he underwent a gracilis muscle flap procedure to close his right groin wound. He states that he is feeling better.   Review of Systems: Pertinent items are noted in HPI.  Past Medical History  Diagnosis Date  . Arteriosclerotic cardiovascular disease (ASCVD)     CABG in 2003 for 3 VD; 07/2007: Stress nuclear-inferior infarction without ischemia; normal EF by echo in 06/2005  . Diabetes mellitus type II   . Hyperlipidemia   . Degenerative joint disease     Left knee  . Cerebrovascular disease 2003    Carotid ultrasound in 2010:  Patent right CEA site, 60-79% left internal carotid artery  . Peripheral vascular disease 2007    aortobifemoral BPG-2007  . Anemia   . Peripheral neuropathy   . Hypertension   . Sick sinus syndrome 1996    permanent pacemaker-1996; generator change in 2010  . Chronic obstructive pulmonary disease   . Obesity   . Obstructive sleep apnea   . Benign prostatic hypertrophy   . Gastroesophageal reflux disease   . Tobacco abuse, in remission     50 pack years total consumption; Quit in 2007    History  Substance Use Topics  . Smoking status: Former Smoker -- 2.00 packs/day for 25 years    Types: Cigarettes    Quit date: 01/22/2005  .  Smokeless tobacco: Former Neurosurgeon    Quit date: 02/16/1996  . Alcohol Use: No    Family History  Problem Relation Age of Onset  . Dementia Mother   . Heart attack Father   . Coronary artery disease Father   . Peripheral vascular disease Sister   . Diabetes Brother   . Peripheral vascular disease Brother   . Hypertension Brother   . Coronary artery disease Brother    Allergies  Allergen Reactions  . Morphine And Related     Pt felt flushed and was diaphoretic     OBJECTIVE: Blood pressure 158/66, pulse 70, temperature 98.7 F (37.1 C), temperature source Oral, resp. rate 18, height  (1.651 m), weight 194 lb 7.1 oz (88.2 kg), SpO2 94 %. General: he is drowsy but can answer questions appropriately. His wife is at the bedside Skin: no rash. New left arm PICC Lungs: clear but he has a frequent dry cough during the exam Cor: distant but regular S1 and S2 with no murmur heard Abdomen: obese, soft and nontender His surgical dressings are in place in his right groin and thigh. A Jackson-Pratt drain is present with thin bloody drainage in the bulb  Lab Results Lab Results  Component Value Date   WBC 11.0* 02/21/2014   HGB 8.6* 02/21/2014   HCT 28.8* 02/21/2014   MCV 84.2 02/21/2014   PLT 410* 02/21/2014    Lab Results  Component Value Date   CREATININE 1.48* 02/21/2014   BUN 17 02/21/2014   NA 135 02/21/2014   K 4.2 02/21/2014   CL 100 02/21/2014   CO2 29 02/21/2014    Lab Results  Component Value Date   ALT 16 02/12/2014   AST 34 02/12/2014   ALKPHOS 50 02/12/2014   BILITOT 1.2 02/12/2014     Microbiology: Recent Results (from the past 240 hour(s))  Wound culture     Status: None   Collection Time: 02/14/14 10:08 AM  Result Value Ref Range Status   Specimen Description WOUND GROIN RIGHT  Final   Special Requests NONE  Final   Gram Stain   Final    NO WBC SEEN NO SQUAMOUS EPITHELIAL CELLS SEEN NO ORGANISMS SEEN Performed at [facility]     Culture   Final    NO GROWTH 3 DAYS Performed at [facility]    Report Status 02/17/2014 FINAL  Final  Body fluid culture     Status: None   Collection Time: 02/15/14  9:49 AM  Result Value Ref Range Status   Specimen Description FLUID RIGHT DRAINAGE GROIN  Final   Special Requests NONE  Final   Gram Stain   Final    ABUNDANT WBC PRESENT, PREDOMINANTLY PMN RARE GRAM  POSITIVE COCCI IN PAIRS [facility]formed at [facility]    Culture   Final    MODERATE METHICILLIN RESISTANT STAPHYLOCOCCUS AUREUS Note: RIFAMPIN AND GENTAMICIN SHOULD NOT BE USED AS SINGLE DRUGS FOR TREATMENT OF STAPH INFECTIONS. This organism DOES NOT demonstrate inducible Clindamycin resistance in vitro. CRITICAL RESULT CALLED TO, READ BACK BY AND VERIFIED WITH: CAROL@8 :58AM ON  02/17/13 BY DANTS Performed at [facility]    Report Status 02/17/2014 FINAL  Final   Organism ID, Bacteria METHICILLIN RESISTANT STAPHYLOCOCCUS AUREUS  Final      Susceptibility   Methicillin resistant staphylococcus aureus - MIC*    CLINDAMYCIN <=0.25 SENSITIVE Sensitive     ERYTHROMYCIN >=8 RESISTANT Resistant     GENTAMICIN <=0.5 SENSITIVE Sensitive     LEVOFLOXACIN >=8 RESISTANT Resistant     OXACILLIN >=4 RESISTANT Resistant     PENICILLIN >=0.5 RESISTANT Resistant     RIFAMPIN <=0.5 SENSITIVE Sensitive     TRIMETH/SULFA >=320 RESISTANT Resistant     VANCOMYCIN 1 SENSITIVE Sensitive     TETRACYCLINE <=1 SENSITIVE Sensitive     * MODERATE METHICILLIN RESISTANT STAPHYLOCOCCUS AUREUS  MRSA PCR Screening     Status: None   Collection Time: 02/21/14  4:11 AM  Result Value Ref Range Status   MRSA by PCR NEGATIVE NEGATIVE Final    Comment:        The GeneXpert MRSA Assay (FDA approved for NASAL specimens only), is one component of a comprehensive MRSA colonization surveillance program. It is not intended to diagnose MRSA infection nor to guide or monitor treatment for MRSA infections.     Cliffton AstersJohn Lainy Wrobleski,  MD [facility] [phone] pager   [phone] cell 02/22/2014, 6:11 PM

## 2014-02-22 NOTE — Progress Notes (Signed)
SUBJECTIVE: Pt denies chest pain, palpitations, and shortness of breath. Lying comfortably. Mild pain at surgical site.     Intake/Output Summary (Last 24 hours) at 02/22/14 1018 Last data filed at 02/22/14 0914  Gross per 24 hour  Intake   3495 ml  Output    395 ml  Net   3100 ml    Current Facility-Administered Medications  Medication Dose Route Frequency Provider Last Rate Last Dose  . 0.9 %  sodium chloride infusion   Intravenous Continuous Leone BrandLaura R Ingold, NP 75 mL/hr at 02/22/14 0601    . acetaminophen (TYLENOL) tablet 1,000 mg  1,000 mg Oral Q6H PRN Wilson SingerNimish C Gosrani, MD   1,000 mg at 02/14/14 0025  . antiseptic oral rinse (CPC / CETYLPYRIDINIUM CHLORIDE 0.05%) solution 7 mL  7 mL Mouth Rinse BID Marinda ElkAbraham Feliz Ortiz, MD   7 mL at 02/21/14 2308  . aspirin chewable tablet 81 mg  81 mg Oral Daily Nimish C Gosrani, MD   81 mg at 02/20/14 1010  . atorvastatin (LIPITOR) tablet 80 mg  80 mg Oral q1800 Leeann MustJacob Kelly, MD   80 mg at 02/21/14 1659  . Chlorhexidine Gluconate Cloth 2 % PADS 6 each  6 each Topical Q0600 Cristal FordSrikar A Reddy, MD   6 each at 02/21/14 0500  . cloNIDine (CATAPRES - Dosed in mg/24 hr) patch 0.1 mg  0.1 mg Transdermal Weekly Cristal FordSrikar A Reddy, MD   0.1 mg at 02/18/14 1100  . ferrous sulfate tablet 325 mg  325 mg Oral BID Wilson SingerNimish C Gosrani, MD   325 mg at 02/21/14 2306  . gabapentin (NEURONTIN) capsule 300 mg  300 mg Oral TID Wilson SingerNimish C Gosrani, MD   300 mg at 02/21/14 2306  . hydrALAZINE (APRESOLINE) injection 10 mg  10 mg Intravenous Q6H PRN Rolan Lipahomas Michael Callahan, NP   10 mg at 02/22/14 0039  . hydrALAZINE (APRESOLINE) tablet 25 mg  25 mg Oral Once Laqueta LindenSuresh A Gradyn Shein, MD      . hydrALAZINE (APRESOLINE) tablet 50 mg  50 mg Oral 3 times per day Laqueta LindenSuresh A Aleta Manternach, MD      . insulin aspart (novoLOG) injection 0-20 Units  0-20 Units Subcutaneous TID WC Wilson SingerNimish C Gosrani, MD   3 Units at 02/21/14 1657  . insulin aspart (novoLOG) injection 0-5 Units  0-5 Units Subcutaneous  QHS Wilson SingerNimish C Gosrani, MD   2 Units at 02/20/14 2323  . insulin detemir (LEVEMIR) injection 25 Units  25 Units Subcutaneous Q2200 Dayna N Dunn, PA-C   25 Units at 02/21/14 2307  . ipratropium-albuterol (DUONEB) 0.5-2.5 (3) MG/3ML nebulizer solution 3 mL  3 mL Nebulization Q4H PRN Nimish Normajean Glasgow Gosrani, MD   3 mL at 02/14/14 0533  . isosorbide mononitrate (IMDUR) 24 hr tablet 30 mg  30 mg Oral Daily Marinda ElkAbraham Feliz Ortiz, MD   30 mg at 02/20/14 1010  . metoprolol tartrate (LOPRESSOR) tablet 25 mg  25 mg Oral BID Wilson SingerNimish C Gosrani, MD   25 mg at 02/21/14 2306  . mupirocin ointment (BACTROBAN) 2 % 1 application  1 application Nasal BID Cristal FordSrikar A Reddy, MD   1 application at 02/21/14 2307  . ondansetron (ZOFRAN) tablet 4 mg  4 mg Oral Q6H PRN Nimish C Karilyn CotaGosrani, MD   4 mg at 02/12/14 1151   Or  . ondansetron (ZOFRAN) injection 4 mg  4 mg Intravenous Q6H PRN Nimish C Gosrani, MD      . rifampin (RIFADIN) capsule 300  mg  300 mg Oral Q12H Randall Hiss, MD   300 mg at 02/21/14 2323  . traMADol (ULTRAM) tablet 50 mg  50 mg Oral Q6H PRN Wilson Singer, MD   50 mg at 02/21/14 2320  . triamcinolone cream (KENALOG) 0.1 % 1 application  1 application Topical BID Wilson Singer, MD   1 application at 02/21/14 2307  . vancomycin (VANCOCIN) IVPB 750 mg/150 ml premix  750 mg Intravenous Q12H Nimish C Karilyn Cota, MD   750 mg at 02/22/14 0545    Filed Vitals:   02/21/14 2135 02/22/14 0030 02/22/14 0148 02/22/14 0544  BP: 195/81 183/85 167/60 170/67  Pulse: 71 70 70 69  Temp: 98.6 F (37 C)   99.6 F (37.6 C)  TempSrc: Oral   Oral  Resp: 18   20  Height:      Weight:      SpO2: 97%  98% 95%    PHYSICAL EXAM General: NAD HEENT: Normal. Neck: No JVD, no thyromegaly.  Lungs: Clear to auscultation anteriorly bilaterally with normal respiratory effort. CV: Nondisplaced PMI.  Regular rate and rhythm, normal S1/S2, no S3/S4, no murmur.  No pretibial edema.  Abdomen: Firm, obese. Right groin bandaged.    Neurologic: Alert and oriented.  Psych: Normal affect. Musculoskeletal: No gross deformities. Extremities: No clubbing or cyanosis.   TELEMETRY: Reviewed telemetry pt in paced rhythm with PVC's, sinus tachycardia noted earlier.  LABS: Basic Metabolic Panel:  Recent Labs  16/10/96 0500 02/21/14 0350  NA 138 135  K 4.1 4.2  CL 101 100  CO2 33* 29  GLUCOSE 160* 221*  BUN 13 17  CREATININE 1.25 1.48*  CALCIUM 8.4 8.3*   Liver Function Tests: No results for input(s): AST, ALT, ALKPHOS, BILITOT, PROT, ALBUMIN in the last 72 hours. No results for input(s): LIPASE, AMYLASE in the last 72 hours. CBC:  Recent Labs  02/20/14 0500 02/21/14 0350  WBC 11.2* 11.0*  HGB 8.7* 8.6*  HCT 29.0* 28.8*  MCV 84.8 84.2  PLT 420* 410*   Cardiac Enzymes: No results for input(s): CKTOTAL, CKMB, CKMBINDEX, TROPONINI in the last 72 hours. BNP: Invalid input(s): POCBNP D-Dimer: No results for input(s): DDIMER in the last 72 hours. Hemoglobin A1C: No results for input(s): HGBA1C in the last 72 hours. Fasting Lipid Panel: No results for input(s): CHOL, HDL, LDLCALC, TRIG, CHOLHDL, LDLDIRECT in the last 72 hours. Thyroid Function Tests: No results for input(s): TSH, T4TOTAL, T3FREE, THYROIDAB in the last 72 hours.  Invalid input(s): FREET3 Anemia Panel: No results for input(s): VITAMINB12, FOLATE, FERRITIN, TIBC, IRON, RETICCTPCT in the last 72 hours.  RADIOLOGY: Dg Chest 2 View  02/11/2014   CLINICAL DATA:  Fever.  Hypoxia.  EXAM: CHEST  2 VIEW  COMPARISON:  07/15/2005 and 07/10/2005  FINDINGS: Pacemaker in place. Previous median sternotomy. There is slight pulmonary vascular congestion but overall heart size is normal. The hilar structures are prominent, right more than left but I feel this is due to the vascular congestion. No effusions. No acute osseous abnormality.  IMPRESSION: Slight pulmonary vascular congestion.   Electronically Signed   By: Geanie [name] M.D.   On: 02/11/2014 15:53    Ct Angio Chest Pe W/cm &/or Wo Cm  02/11/2014   CLINICAL DATA:  Short of breath and hypoxia.  Chills and fever  EXAM: CT ANGIOGRAPHY CHEST WITH CONTRAST  TECHNIQUE: Multidetector CT imaging of the chest was performed using the standard protocol during bolus administration of intravenous contrast. Multiplanar  CT image reconstructions and MIPs were obtained to evaluate the vascular anatomy.  CONTRAST:  OMNIPAQUE IOHEXOL 350 MG/ML SOLN  COMPARISON:  None.  FINDINGS: Mediastinum: There is moderate cardiac enlargement. The patient is status post median sternotomy and CABG procedure. No pericardial effusion identified. The trachea appears patent and is midline. Normal appearance of the esophagus. Prominent AP window lymph node measures 1.4 cm, image number 35/series 6. The main pulmonary artery appears patent. No lobar or segmental pulmonary artery filling defects to suggest a clinically significant pulmonary embolus.  Lungs/Pleura: No airspace consolidation. Mild lower lobe predominant dependent changes are identified including subsegmental atelectasis.  Upper Abdomen: The visualized portions of the liver and spleen are normal. The adrenal glands are unremarkable. The visualized portions of the gallbladder are also normal.  Musculoskeletal: Mild multi level degenerative disc disease identified within the thoracic spine. There is a curvature of the thoracic spine which is convex towards the right.  Review of the MIP images confirms the above findings.  IMPRESSION: 1. No evidence for acute pulmonary embolus. 2. Cardiac enlargement status post CABG procedure.   Electronically Signed   By: Signa Kell M.D.   On: 02/11/2014 17:21   US Pelvis Limited  02/12/2014   CLINICAL DATA:  Fever. Evaluate for abscess. Palpable abnormality right inguinal region.  EXAM: LIMITED ULTRASOUND OF PELVIS  TECHNIQUE: Limited transabdominal ultrasound examination of the pelvis was performed.  COMPARISON:  None.  FINDINGS: A  large 11.4 x 10.0 x 4.6 cm right groin complex fluid collection is noted. This would be consistent with a abscess. To exclude other pathology including hernia, CT can be obtained if needed.  IMPRESSION: Large 11.4 x 10.0 x 4.6 cm right groin complex fluid collection noted. This would be consistent with an abscess. Further evaluation with CT can be obtained if needed .   Electronically Signed   By: Maisie Fus  Register   On: 02/12/2014 10:13   Nm Myocar Multi W/spect W/wall Motion / Ef  02/19/2014   CLINICAL DATA:  Chest pain.  Prior CABG.  Hypertension.  Smoker.  EXAM: MYOCARDIAL IMAGING WITH SPECT (REST AND PHARMACOLOGIC-STRESS)  GATED LEFT VENTRICULAR WALL MOTION STUDY  LEFT VENTRICULAR EJECTION FRACTION  TECHNIQUE: Standard myocardial SPECT imaging was performed after resting intravenous injection of 10 mCi Tc-34m sestamibi. Subsequently, intravenous infusion of Lexiscan was performed under the supervision of the Cardiology staff. At peak effect of the drug, 30 mCi Tc-2m sestamibi was injected intravenously and standard myocardial SPECT imaging was performed. Quantitative gated imaging was also performed to evaluate left ventricular wall motion, and estimate left ventricular ejection fraction.  COMPARISON:  08/02/2007  FINDINGS: Perfusion: Mild fixed defect involving the inferior wall. Possibly related to diaphragmatic attenuation.  A rest area of decreased activity involves the mid anterior wall, mild. There is an area of mild reversibility, which is medium in size. This involves the apical to mid segment of the anterior and anterior lateral wall.  Wall Motion: Global hypokinesis. More focal hypokinesis to dyskinesis involving the lateral left ventricular wall.  Left Ventricular Ejection Fraction: 44 %  End diastolic volume 130 ml  End systolic volume 73 ml  IMPRESSION: 1. Medium-sized area of mild reversibility involving the anterior and anterior lateral walls. Suspicious for inducible ischemia.  2. Global  hypokinesis with lateral wall focal hypokinesis to dyskinesis.  3. Left ventricular ejection fraction 44%  4. Intermediate-risk stress test findings*.  *2012 Appropriate Use Criteria for Coronary Revascularization Focused Update: J Am Coll Cardiol. 2012;59(9):857-881. http://content.dementiazones.com.aspx?articleid=1201161  Electronically Signed   By: Jeronimo Greaves M.D.   On: 02/19/2014 16:26   Dg Chest Port 1 View  02/12/2014   CLINICAL DATA:  Status post PICC line placement.  EXAM: PORTABLE CHEST - 1 VIEW  COMPARISON:  February 11, 2014.  FINDINGS: Stable cardiomediastinal silhouette. Sternotomy wires are noted. Stable mild central pulmonary vascular congestion. Right-sided pacemaker is unchanged in position. No pneumothorax or pleural effusion is noted. Interval placement of left-sided PICC line with distal tip overlying expected position of the SVC.  IMPRESSION: Interval placement of left-sided PICC line with distal tip overlying expected position of the SVC.   Electronically Signed   By: Roque Lias M.D.   On: 02/12/2014 14:52   Ct Angio Abd/pel W/ And/or W/o  02/14/2014   CLINICAL DATA:  Complex fluid collection of the right groin and status post prior aortobifemoral bypass grafting in 2007 with prior revision of the mid pole right graft limb  EXAM: CTA ABDOMEN AND PELVIS WITHOUT CONTRAST  TECHNIQUE: Multidetector CT imaging of the abdomen and pelvis was performed using the standard protocol during bolus administration of intravenous contrast. Multiplanar reconstructed images and MIPs were obtained and reviewed to evaluate the vascular anatomy.  CONTRAST:  80mL OMNIPAQUE IOHEXOL 350 MG/ML SOLN  COMPARISON:  Right groin ultrasound on 02/12/2014  FINDINGS: Complex fluid collection of the right inguinal region present with 3 separate components that likely communicate with 1 another. Superior component is associated with the inferior abdominal wall and measures approximately 5.5 cm in diameter.  Surrounding inflammation present. The deep portion of this fluid collection abuts the distal right limb of an aortobifemoral graft without evidence of graft pseudoaneurysm or erosion. No air is identified adjacent to the graft or in the fluid collection  Communicating second pocket of fluid extends towards the lower inguinal region and measures approximately 5 cm in diameter. There is a soft tissue wound just superficial to the second component of loculated fluid. The wound itself does not appear to extend all the way to the level of fluid. Third communicating pocket extends into the right inguinal canal and measures approximately 6 cm in greatest diameter. In the coronal projection, all of the fluid collections at up to roughly 12 cm in maximal diameter. Findings are likely consistent with multi loculated abscess given constellation of CT and ultrasound findings.  The aortobifemoral graft itself shows normal patency. Both native SFA origins appear to likely be occluded chronically. Native abdominal aorta above the graft shows no evidence of aneurysmal disease or significant stenosis. There is approximately 80% narrowing at the origin of the celiac axis. The superior mesenteric artery trunk shows diffuse and significant atherosclerosis. Bilateral renal arteries are heavily calcified proximally.  The liver shows steatosis. There is reflux of contrast into the intrahepatic IVC and hepatic veins consistent with a component of right heart failure. The visualized lung bases show small bilateral pleural effusions.  Rounded hyperdense cortical lesion of the lateral mid left kidney measures approximately 13 mm in diameter. This represents either a hemorrhagic cyst or enhancing cortical lesion. No abnormalities are seen involving bowel. The bladder appears unremarkable. No bony abnormalities.  Review of the MIP images confirms the above findings.  IMPRESSION: 1. Complex and multi lobulated fluid collection in the right  inguinal region with 3 separate components present. These likely communicate with one another and span over a distance of roughly 12 cm. Part of the fluid collection does abut the distal right limb of the aortobifemoral graft. No evidence of  gas in the fluid or graft pseudoaneurysm. This most likely represents a complex abscess. Inferior component of fluid extends into the right inguinal canal. 2. Probable underlying right heart failure with small bilateral pleural effusions. 3. 13 mm hyperdense cortical lesion of the mid lateral left renal cortex. This is incompletely evaluated on the CTA study. This could represent a hyperdense/hemorrhagic cyst or enhancing cortical neoplasm. Followup is recommended. The most definitive evaluation would be MRI of the abdomen when the patient is able to tolerate MRI examination.   Electronically Signed   By: Irish Lack M.D.   On: 02/14/2014 15:41      ASSESSMENT AND PLAN: 1. CAD with CABG: Stable ischemic heart disease with continued patency of all grafts (LIMA to LAD, saphenous vein graft to PDA, saphenous vein graft sequential to OM1 and OM 2) noted by cath on 1/7. Echo on 12/30 demonstrated normal LV systolic function, moderate LVH, and diastolic dysfunction. Continue ASA, Lipitor, Imdur, and metoprolol. 2. Essential HTN: BP still elevated with SBP in 170-190 mmHg range. Will increase hydralazine to 50 mg tid. 3. Hyperlipidemia: On high potency statin therapy with Lipitor. 4. Pacemaker: Stable, pacing appropriately. 5. PVD: POD #1 s/p gracilis muscle flap to right groin. Will check CBC to monitor Hgb. 6. Acute kidney injury: Will obtain BMET. SCr 1.48 on 1/8 (1.25 on 1/7).   Prentice Docker, M.D., F.A.C.C.

## 2014-02-23 DIAGNOSIS — I499 Cardiac arrhythmia, unspecified: Secondary | ICD-10-CM

## 2014-02-23 LAB — CBC
HEMATOCRIT: 27.5 % — AB (ref 39.0–52.0)
Hemoglobin: 8.4 g/dL — ABNORMAL LOW (ref 13.0–17.0)
MCH: 25.5 pg — ABNORMAL LOW (ref 26.0–34.0)
MCHC: 30.5 g/dL (ref 30.0–36.0)
MCV: 83.3 fL (ref 78.0–100.0)
PLATELETS: 421 10*3/uL — AB (ref 150–400)
RBC: 3.3 MIL/uL — ABNORMAL LOW (ref 4.22–5.81)
RDW: 14.3 % (ref 11.5–15.5)
WBC: 10.7 10*3/uL — AB (ref 4.0–10.5)

## 2014-02-23 LAB — GLUCOSE, CAPILLARY
GLUCOSE-CAPILLARY: 174 mg/dL — AB (ref 70–99)
GLUCOSE-CAPILLARY: 114 mg/dL — AB (ref 70–99)
Glucose-Capillary: 174 mg/dL — ABNORMAL HIGH (ref 70–99)
Glucose-Capillary: 128 mg/dL — ABNORMAL HIGH (ref 70–99)
Glucose-Capillary: 146 mg/dL — ABNORMAL HIGH (ref 70–99)

## 2014-02-23 LAB — BASIC METABOLIC PANEL
Anion gap: 8 (ref 5–15)
BUN: 34 mg/dL — ABNORMAL HIGH (ref 6–23)
CHLORIDE: 98 meq/L (ref 96–112)
CO2: 25 mmol/L (ref 19–32)
CREATININE: 3.1 mg/dL — AB (ref 0.50–1.35)
Calcium: 8.4 mg/dL (ref 8.4–10.5)
GFR, EST AFRICAN AMERICAN: 23 mL/min — AB (ref >90)
GFR, EST NON AFRICAN AMERICAN: 20 mL/min — AB (ref >90)
Glucose, Bld: 157 mg/dL — ABNORMAL HIGH (ref 70–99)
POTASSIUM: 5.2 mmol/L — AB (ref 3.5–5.1)
SODIUM: 131 mmol/L — AB (ref 135–145)

## 2014-02-23 MED ORDER — GABAPENTIN 300 MG PO CAPS
300.0000 mg | ORAL_CAPSULE | Freq: Two times a day (BID) | ORAL | Status: DC
  Administered 2014-02-23 – 2014-02-24 (×3): 300 mg via ORAL
  Filled 2014-02-23 (×5): qty 1

## 2014-02-23 MED ORDER — TAMSULOSIN HCL 0.4 MG PO CAPS
0.4000 mg | ORAL_CAPSULE | Freq: Every day | ORAL | Status: DC
  Administered 2014-02-23 – 2014-02-26 (×4): 0.4 mg via ORAL
  Filled 2014-02-23 (×4): qty 1

## 2014-02-23 MED ORDER — SODIUM CHLORIDE 0.9 % IJ SOLN
10.0000 mL | INTRAMUSCULAR | Status: DC | PRN
  Administered 2014-02-23 – 2014-03-03 (×6): 10 mL
  Filled 2014-02-23 (×5): qty 40

## 2014-02-23 NOTE — Progress Notes (Signed)
Physical Therapy Evaluation Patient Details Name: Collin LeitzWilliam F [name] MRN: [id number] DOB: 05/23/1950 Today's Date: 02/23/2014   History of Present Illness  Patient is a 64 yo male admitted 02/11/14 with acute hypoxic respiratory failure, sepsis and elevated cardiac enzymes.  CT showed Rt groin abscesses and possible graft infection.  Patient s/p I&D on 02/15/14, and s/p muscle flap on 02/21/14.   PMH:  Aortofemoral BPG, DM, COPD, HTN, ASCVD, CABG, PVD, peripheral neuropathy  Clinical Impression  Patient presents with problems listed below.  Will benefit from acute PT to maximize independence prior to discharge.  Currently patient with decreased cognition and requiring +2 assist for mobility.  Recommend SNF for continued therapy at discharge.       Follow Up Recommendations SNF;Supervision/Assistance - 24 hour    Equipment Recommendations  3in1 (PT)    Recommendations for Other Services       Precautions / Restrictions Precautions Precautions: Fall Restrictions Weight Bearing Restrictions: No      Mobility  Bed Mobility Overal bed mobility: Needs Assistance Bed Mobility: Supine to Sit;Sit to Supine     Supine to sit: Max assist Sit to supine: Mod assist   General bed mobility comments: Repeated verbal and tactile cues for mobility.  Assist to raise trunk to sitting, and move hips to EOB using bed pads.  Once at EOB, patient able to maintain sitting balance.  Assist to bring LE's onto bed and [facility] to [facility] to supine.  Transfers Overall transfer level: Needs assistance Equipment used: Rolling walker (2 wheeled) Transfers: Sit to/from Stand Sit to Stand: Max assist         General transfer comment: Repeated cues for hand placement and technique.  Patient required multiple cues to attempt to stand.  On second attempt was able to stand to upright position.  Patient extending knees initially with feet sliding forward.  Cues to keep feet under hips to move to standing.   Patient required assist to maintain balance.   Ambulation/Gait Ambulation/Gait assistance: Max assist Ambulation Distance (Feet): 0 Feet Assistive device: Rolling walker (2 wheeled) Gait Pattern/deviations: Leaning posteriorly     General Gait Details: Attempted to take steps, and patient's knees buckled - patient sat back onto bed.  Stairs            Wheelchair Mobility    Modified Rankin (Stroke Patients Only)       Balance Overall balance assessment: Needs assistance         Standing balance support: Bilateral upper extremity supported Standing balance-Leahy Scale: Poor Standing balance comment: Posterior lean                             Pertinent Vitals/Pain Pain Assessment: No/denies pain    Home Living Family/patient expects to be discharged to:: Private residence Living Arrangements: Spouse/significant other Available Help at Discharge: Family;Available 24 hours/day (Wife ambulates with cane) Type of Home: House Home Access: Stairs to enter Entrance Stairs-Rails: None Entrance Stairs-Number of Steps: 1 Home Layout: One level Home Equipment: Environmental consultantWalker - 2 wheels      Prior Function Level of Independence: Independent with assistive device(s)         Comments: Wife reports patient just started using RW     Hand Dominance   Dominant Hand: Right    Extremity/Trunk Assessment   Upper Extremity Assessment: Overall WFL for tasks assessed           Lower Extremity Assessment:  Generalized weakness         Communication   Communication: Expressive difficulties ([place]. Low volume.  Difficult to understand)  Cognition Arousal/Alertness: Lethargic Behavior During Therapy: WFL for tasks assessed/performed Overall Cognitive Status: Impaired/Different from baseline Area of Impairment: Orientation;Attention;Memory;Following commands;Awareness;Problem solving Orientation Level: Disoriented to;Place;Time;Situation Current Attention  Level: Focused Memory: Decreased short-term memory Following Commands: Follows one step commands inconsistently;Follows one step commands with increased time     Problem Solving: Decreased initiation;Difficulty sequencing;Requires verbal cues;Requires tactile cues;Slow processing General Comments: Patient stating his grandson is standing behind PT - no one is there.  States "He's messing with you"   Unable to provide PT with wife's name.  Giving incorrect information about living situation (wife correcting).  Patient attempting to scratch Rt thigh and touching drains.  Repeated cues to leave drains alone, and covered with gown.    General Comments      Exercises        Assessment/Plan    PT Assessment Patient needs continued PT services  PT Diagnosis Difficulty walking;Generalized weakness;Altered mental status   PT Problem List Decreased strength;Decreased activity tolerance;Decreased balance;Decreased mobility;Decreased cognition;Decreased knowledge of use of DME;Decreased safety awareness;Obesity  PT Treatment Interventions DME instruction;Gait training;Functional mobility training;Therapeutic activities;Therapeutic exercise;Balance training;Cognitive remediation;Patient/family education   PT Goals (Current goals can be found in the Care Plan section) Acute Rehab PT Goals Patient Stated Goal: None stated PT Goal Formulation: With patient/family Time For Goal Achievement: 03/02/14 Potential to Achieve Goals: Fair    Frequency Min 3X/week   Barriers to discharge        Co-evaluation               End of Session Equipment Utilized During Treatment: Gait belt;Oxygen Activity Tolerance: Patient limited by fatigue Patient left: in bed;with call bell/phone within reach;with bed alarm set;with family/visitor present Nurse Communication: Mobility status (Confusion / decreased cognition)         Time: 1735-1759 PT Time Calculation (min) (ACUTE ONLY): 24  min   Charges:   PT Evaluation $Initial PT Evaluation Tier I: 1 Procedure PT Treatments $Therapeutic Activity: 8-22 mins   PT G CodesVena Austria Mar 05, 2014, 8:23 PM Durenda Hurt. Renaldo Fiddler, [facility] Acute Rehab Services Pager [phone]

## 2014-02-23 NOTE — Progress Notes (Addendum)
[facility] PROGRESS NOTE Interim history: 64 year old male with past medical history a recent aorta femoral bypass, who came in with acute hypoxic respiratory failure, sepsis and elevated cardiac enzymes. Started on empiric antibiotics. CT imaging of the Chest was done in ED showed no PE. He also developed acute kidney injury probably due to sepsis, ACE inhibitor and contrast-induced nephropathy which resolved. Groin ultrasound showed abscess, vascular surgery consulted recommended a CT scan of the abdomen and pelvis with contrast emergently on 02/14/2014 which showed multiple loculated abscesses and possible infected native graft, he is  status post I&D and possible graft infection on 02/15/2014. Blood cultures negative but abscess cultures are growing MRSA, continued on vancomycin and cefepime was discontinued.  He will need an ischemic evaluation.  He was transfused 1 unit of PRBC.  Given clinical improvement, he was transferred out of stepdown to telemetry on 02/17/2014.  Plastic surgery was consulted by vascular surgery, anticipate surgery later this week after myocardial perfusion scan and depending on cardiac recommendations.   Filed Weights   02/11/14 1328 02/11/14 2059 02/12/14 0535  Weight: 89.812 kg (198 lb) 90 kg (198 lb 6.6 oz) 88.2 kg (194 lb 7.1 oz)        Intake/Output Summary (Last 24 hours) at 02/23/14 1058 Last data filed at 02/23/14 1033  Gross per 24 hour  Intake   2775 ml  Output    485 ml  Net   2290 ml     Assessment/Plan: Sepsis/Due to R groin abscess with infected graft: - Cultures growing MRSA, continue on vancomycin.  - Ct angio of graft showed multiple abscess and possible infected native graft. Vascular and Plastic Surgery, onboard.  S/p surgery 1/8 - Blood cultures negative and final. - S/p I&D of groin abscess, cultures from 02/15/2013 MRSA. - ID to help with abx  Acute respiratory failure with hypoxia - Likely due to sepsis of the groin and  demand ischemia.  Resolved.  AKI (acute kidney injury)/acute toxic nephropathy: - Multifactorial due to sepsis, ACE inhibitor and contrast-induced nephropathy. ACE held. - bladder scan and see if Cr increased due to blockage- replace foley -renal consult if not improved ? If abx are causing AKI vs obstruction- check BMP this PM- spoke with Dr. Orvan Falconer, continue abx for now -not on ACE  Elevated Troponin's: - Cont aspirin, and beta blockers. Cardiology consulted -Abnormal stress test followed by cardiac cath 1/7 showed patent all bypass grafts and cardiology states low risk of major ischemic events related to surgery and no further cardiac testing at this point.  Hyponatremia - Resolved with hydration.  Chronic obstructive pulmonary disease - Stable.  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  Essential hypertension - Mildly uncontrolled and fluctuating.  -hydralazine added 1/9  Normocytic anemia: - S/p 2 units of packed red blood cells. - will transfuse for < 8 due to cardiac hx - Stable.   Code Status: Full code  DVT Prophylaxis: SCDs. Family Communication: patient Disposition Plan: Not medically stable for discharge.    Consultants:  Cardiology  Vascular Surgery  Plastic Surgery  ID  Procedures: ECHO: EF normal no wall motion. Pelvic US 12.30.2015: Large 11.4 x 10.0 x 4.6 cm right groin complex fluid collection noted  LHC 02/20/14  Antibiotics:  Vancomycin 02/11/2014   Cefepime 02/11/2014 -> 02/17/2013.  HPI/Subjective: Says he hasn't peed in 3 days  Objective: Filed Vitals:   02/22/14 1500 02/22/14 2144 02/22/14 2157 02/23/14  0631  BP: 158/66 138/61  155/71  Pulse:  70  69  Temp:  99.1 F (37.3 C)  98.4 F (36.9 C)  TempSrc:  Oral  Oral  Resp:  18    Height:      Weight:      SpO2:  77% 94% 95%     Exam:  Physical Exam: General: Awake, Oriented, No acute  distress. CV: S1 and S2, RRR. No JVD or murmurs. Telemetry: AV paced rhythm. Lungs: Clear to ascultation bilaterally Abdomen: Soft, Nontender, Nondistended, +bowel sounds. Ext: Good pulses. Trace edema. Skin: dressing of right groin clean and dry.   Data Reviewed: Basic Metabolic Panel:  Recent Labs Lab 02/17/14 0420 02/18/14 0450 02/19/14 0508 02/20/14 0500 02/21/14 0350 02/23/14 0310  NA 138  --  137 138 135 131*  K 3.9  --  3.8 4.1 4.2 5.2*  CL 103  --  101 101 100 98  CO2 29  --  32 33* 29 25  GLUCOSE 178*  --  150* 160* 221* 157*  BUN 15  --  9 13 17  34*  CREATININE 1.37* 1.21 1.21 1.25 1.48* 3.10*  CALCIUM 8.6  --  8.6 8.4 8.3* 8.4   Liver Function Tests: No results for input(s): AST, ALT, ALKPHOS, BILITOT, PROT, ALBUMIN in the last 168 hours. No results for input(s): LIPASE, AMYLASE in the last 168 hours. No results for input(s): AMMONIA in the last 168 hours. CBC:  Recent Labs Lab 02/18/14 0450 02/19/14 0508 02/20/14 0500 02/21/14 0350 02/23/14 0310  WBC 7.9 10.5 11.2* 11.0* 10.7*  HGB 9.4* 9.0* 8.7* 8.6* 8.4*  HCT 30.1* 29.7* 29.0* 28.8* 27.5*  MCV 86.0 84.6 84.8 84.2 83.3  PLT 325 386 420* 410* 421*   Cardiac Enzymes: No results for input(s): CKTOTAL, CKMB, CKMBINDEX, TROPONINI in the last 168 hours. BNP (last 3 results) No results for input(s): PROBNP in the last 8760 hours. CBG:  Recent Labs Lab 02/22/14 0749 02/22/14 1156 02/22/14 1720 02/22/14 2207 02/23/14 0804  GLUCAP 156* 173* 158* 174* 174*    Recent Results (from the past 240 hour(s))  Wound culture     Status: None   Collection Time: 02/14/14 10:08 AM  Result Value Ref Range Status   Specimen Description WOUND GROIN RIGHT  Final   Special Requests NONE  Final   Gram Stain   Final    NO WBC SEEN NO SQUAMOUS EPITHELIAL CELLS SEEN NO ORGANISMS SEEN Performed at [facility]    Culture   Final    NO GROWTH 3 DAYS Performed at [facility]    Report Status  02/17/2014 FINAL  Final  Body fluid culture     Status: None   Collection Time: 02/15/14  9:49 AM  Result Value Ref Range Status   Specimen Description FLUID RIGHT DRAINAGE GROIN  Final   Special Requests NONE  Final   Gram Stain   Final    ABUNDANT WBC PRESENT, PREDOMINANTLY PMN RARE GRAM POSITIVE COCCI IN PAIRS Performed at [facility]    Culture   Final    MODERATE METHICILLIN RESISTANT STAPHYLOCOCCUS AUREUS Note: RIFAMPIN AND GENTAMICIN SHOULD NOT BE USED AS SINGLE DRUGS FOR TREATMENT OF STAPH INFECTIONS. This organism DOES NOT demonstrate inducible Clindamycin resistance in vitro. CRITICAL RESULT CALLED TO, READ BACK BY AND VERIFIED WITH: CAROL@8 :58AM ON  02/17/13 BY DANTS Performed at [facility]    Report Status 02/17/2014 FINAL  Final   Organism ID, Bacteria METHICILLIN RESISTANT  STAPHYLOCOCCUS AUREUS  Final      Susceptibility   Methicillin resistant staphylococcus aureus - MIC*    CLINDAMYCIN <=0.25 SENSITIVE Sensitive     ERYTHROMYCIN >=8 RESISTANT Resistant     GENTAMICIN <=0.5 SENSITIVE Sensitive     LEVOFLOXACIN >=8 RESISTANT Resistant     OXACILLIN >=4 RESISTANT Resistant     PENICILLIN >=0.5 RESISTANT Resistant     RIFAMPIN <=0.5 SENSITIVE Sensitive     TRIMETH/SULFA >=320 RESISTANT Resistant     VANCOMYCIN 1 SENSITIVE Sensitive     TETRACYCLINE <=1 SENSITIVE Sensitive     * MODERATE METHICILLIN RESISTANT STAPHYLOCOCCUS AUREUS  MRSA PCR Screening     Status: None   Collection Time: 02/21/14  4:11 AM  Result Value Ref Range Status   MRSA by PCR NEGATIVE NEGATIVE Final    Comment:        The GeneXpert MRSA Assay (FDA approved for NASAL specimens only), is one component of a comprehensive MRSA colonization surveillance program. It is not intended to diagnose MRSA infection nor to guide or monitor treatment for MRSA infections.      Studies: No results found.  Scheduled Meds: . antiseptic oral rinse  7 mL Mouth Rinse BID  . aspirin  81  mg Oral Daily  . atorvastatin  80 mg Oral q1800  . cloNIDine  0.1 mg Transdermal Weekly  . ferrous sulfate  325 mg Oral BID  . gabapentin  300 mg Oral BID  . hydrALAZINE  50 mg Oral 3 times per day  . insulin aspart  0-20 Units Subcutaneous TID WC  . insulin aspart  0-5 Units Subcutaneous QHS  . insulin detemir  25 Units Subcutaneous Q2200  . isosorbide mononitrate  30 mg Oral Daily  . metoprolol  25 mg Oral BID  . mupirocin ointment  1 application Nasal BID  . rifampin  300 mg Oral Q12H  . tamsulosin  0.4 mg Oral Daily  . triamcinolone cream  1 application Topical BID   Continuous Infusions:    Time: 25 minutes.  Chalmers Cater [facility] Pager [phone] If 7PM-7AM, please contact night-coverage [email or web address] Password [email or web address] 02/23/2014, 10:58 AM      LOS: 12 days

## 2014-02-23 NOTE — Progress Notes (Signed)
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 Worsening renal function. BP better, still often with elevated SBP. No angina pectoris or dyspnea (at rest)  CURRENT MEDS . antiseptic oral rinse  7 mL Mouth Rinse BID  . aspirin  81 mg Oral Daily  . atorvastatin  80 mg Oral q1800  . cloNIDine  0.1 mg Transdermal Weekly  . ferrous sulfate  325 mg Oral BID  . gabapentin  300 mg Oral BID  . hydrALAZINE  50 mg Oral 3 times per day  . insulin aspart  0-20 Units Subcutaneous TID WC  . insulin aspart  0-5 Units Subcutaneous QHS  . insulin detemir  25 Units Subcutaneous Q2200  . isosorbide mononitrate  30 mg Oral Daily  . metoprolol  25 mg Oral BID  . mupirocin ointment  1 application Nasal BID  . rifampin  300 mg Oral Q12H  . tamsulosin  0.4 mg Oral Daily  . triamcinolone cream  1 application Topical BID    OBJECTIVE   Intake/Output Summary (Last 24 hours) at 02/23/14 1043 Last data filed at 02/23/14 1033  Gross per 24 hour  Intake   2775 ml  Output    485 ml  Net   2290 ml   Filed Weights   02/11/14 1328 02/11/14 2059 02/12/14 0535  Weight: 198 lb (89.812 kg) 198 lb 6.6 oz (90 kg) 194 lb 7.1 oz (88.2 kg)    PHYSICAL EXAM Filed Vitals:   02/22/14 1500 02/22/14 2144 02/22/14 2157  02/23/14 0631  BP: 158/66 138/61  155/71  Pulse:  70  69  Temp:  99.1 F (37.3 C)  98.4 F (36.9 C)  TempSrc:  Oral  Oral  Resp:  18    Height:      Weight:      SpO2:  77% 94% 95%   General: Alert, oriented x3, no distress Head: no evidence of trauma, PERRL, EOMI, no exophtalmos or lid lag, no myxedema, no xanthelasma; normal ears, nose and oropharynx Neck: normal jugular venous pulsations and no hepatojugular reflux; brisk carotid pulses without delay and no carotid bruits Chest: clear to auscultation, no signs of consolidation by percussion or palpation, normal fremitus, symmetrical and full respiratory excursions Cardiovascular: normal position and quality of the apical impulse, regular rhythm, normal first and second heart sounds, no rubs or gallops, no murmur Abdomen: firma and mildly distended, no masses by palpation, no abnormal pulsatility or arterial bruits, normal bowel sounds, no hepatosplenomegaly Extremities: no clubbing, cyanosis or edema; 2+ radial, ulnar and brachial pulses bilaterally; 2+ right femoral, posterior tibial and dorsalis pedis pulses; 2+ left femoral, posterior tibial and dorsalis pedis pulses; no subclavian or femoral bruits Neurological: grossly nonfocal  LABS  CBC  Recent Labs  02/21/14 0350 02/23/14 0310  WBC 11.0*  10.7*  HGB 8.6* 8.4*  HCT 28.8* 27.5*  MCV 84.2 83.3  PLT 410* 421*   Basic Metabolic Panel  Recent Labs  02/21/14 0350 02/23/14 0310  NA 135 131*  K 4.2 5.2*  CL 100 98  CO2 29 25  GLUCOSE 221* 157*  BUN 17 34*  CREATININE 1.48* 3.10*  CALCIUM 8.3* 8.4   Liver Function Tests No results for input(s): AST, ALT, ALKPHOS, BILITOT, PROT, ALBUMIN in the last 72 hours. No results for input(s): LIPASE, AMYLASE in the last 72 hours. Cardiac Enzymes No results for input(s): CKTOTAL, CKMB, CKMBINDEX, TROPONINI in the last 72 hours. BNP Invalid input(s): POCBNP D-Dimer No results for input(s): DDIMER in the last 72  hours. Hemoglobin A1C No results for input(s): HGBA1C in the last 72 hours. Fasting Lipid Panel No results for input(s): CHOL, HDL, LDLCALC, TRIG, CHOLHDL, LDLDIRECT in the last 72 hours. Thyroid Function Tests No results for input(s): TSH, T4TOTAL, T3FREE, THYROIDAB in the last 72 hours.  Invalid input(s): FREET3  Radiology Studies Imaging results have been reviewed and No results found.  TELE Atrial paced, ventricular sensed, rare PVCs, one 4-beat AIVR  ECG 12/30, A paced V sensed, anterior T wave inversion.  ASSESSMENT AND PLAN  Acute oligoanuric renal failure Obstructive? Bladder scan and Foley planned.  Timing is also right for this to be contrast nephrotoxicity from cath on 02/20/2014, although only 75 mL iodinated contrast was used. Note he also had chest CT Angio on 02/11/2014 and abdominal CT angio on 02/14/2014. Recommend Nephrology evaluation.  HTN Avoid excessive reduction in BP with acute renal failure, until mechanism is clarified. No further med adjustment today.  CAD s/p CABG All grafts patent. Nuclear perfusion suggested mild anterior ischemia, no major territories of ischemia expected by cath.  Small NSTEMI during critical illness, due "demand ischemia". Now asymptomatic.  Sepsis/infected vascular graft s/p muscle graft  SSS s/p PPM No further PMT since PVARP adjustment   Thurmon Fair, MD, [facility] [phone] office [phone] pager 02/23/2014 10:43 AM

## 2014-02-23 NOTE — Progress Notes (Signed)
ANTIBIOTIC CONSULT NOTE - FOLLOW UP  Pharmacy Consult for Vancomycin Indication: Groin wound --> MRSA  Allergies  Allergen Reactions  . Morphine And Related     Pt felt flushed and was diaphoretic     Labs:  Recent Labs  02/21/14 0350 02/23/14 0310  WBC 11.0* 10.7*  HGB 8.6* 8.4*  PLT 410* 421*  CREATININE 1.48* 3.10*   Estimated Creatinine Clearance: 24.6 mL/min (by C-G formula based on Cr of 3.1). No results for input(s): VANCOTROUGH, VANCOPEAK, VANCORANDOM, GENTTROUGH, GENTPEAK, GENTRANDOM, TOBRATROUGH, TOBRAPEAK, TOBRARND, AMIKACINPEAK, AMIKACINTROU, AMIKACIN in the last 72 hours.   Microbiology: Recent Results (from the past 720 hour(s))  Blood culture (routine x 2)     Status: None   Collection Time: 02/11/14  2:29 PM  Result Value Ref Range Status   Specimen Description BLOOD RIGHT HAND  Final   Special Requests BOTTLES DRAWN AEROBIC AND ANAEROBIC 4CC  Final   Culture NO GROWTH 5 DAYS  Final   Report Status 02/16/2014 FINAL  Final  Blood culture (routine x 2)     Status: None   Collection Time: 02/11/14  2:33 PM  Result Value Ref Range Status   Specimen Description BLOOD RIGHT ANTECUBITAL  Final   Special Requests BOTTLES DRAWN AEROBIC ONLY 6CC  Final   Culture NO GROWTH 5 DAYS  Final   Report Status 02/16/2014 FINAL  Final  Urine culture     Status: None   Collection Time: 02/11/14  2:56 PM  Result Value Ref Range Status   Specimen Description URINE, CLEAN CATCH  Final   Special Requests NONE  Final   Colony Count NO GROWTH Performed at [facility]   Final   Culture NO GROWTH Performed at [facility]   Final   Report Status 02/12/2014 FINAL  Final  MRSA PCR Screening     Status: None   Collection Time: 02/11/14  6:40 PM  Result Value Ref Range Status   MRSA by PCR NEGATIVE NEGATIVE Final    Comment:        The GeneXpert MRSA Assay (FDA approved for NASAL specimens only), is one component of a comprehensive MRSA  colonization surveillance program. It is not intended to diagnose MRSA infection nor to guide or monitor treatment for MRSA infections.   Wound culture     Status: None   Collection Time: 02/14/14 10:08 AM  Result Value Ref Range Status   Specimen Description WOUND GROIN RIGHT  Final   Special Requests NONE  Final   Gram Stain   Final    NO WBC SEEN NO SQUAMOUS EPITHELIAL CELLS SEEN NO ORGANISMS SEEN Performed at [facility]    Culture   Final    NO GROWTH 3 DAYS Performed at [facility]    Report Status 02/17/2014 FINAL  Final  Body fluid culture     Status: None   Collection Time: 02/15/14  9:49 AM  Result Value Ref Range Status   Specimen Description FLUID RIGHT DRAINAGE GROIN  Final   Special Requests NONE  Final   Gram Stain   Final    ABUNDANT WBC PRESENT, PREDOMINANTLY PMN RARE GRAM POSITIVE COCCI IN PAIRS Performed at [facility]    Culture   Final    MODERATE METHICILLIN RESISTANT STAPHYLOCOCCUS AUREUS Note: RIFAMPIN AND GENTAMICIN SHOULD NOT BE USED AS SINGLE DRUGS FOR TREATMENT OF STAPH INFECTIONS. This organism DOES NOT demonstrate inducible Clindamycin resistance in vitro. CRITICAL RESULT CALLED TO, READ BACK  BY AND VERIFIED WITH: CAROL@8 :58AM ON  02/17/13 BY DANTS Performed at [facility]    Report Status 02/17/2014 FINAL  Final   Organism ID, Bacteria METHICILLIN RESISTANT STAPHYLOCOCCUS AUREUS  Final      Susceptibility   Methicillin resistant staphylococcus aureus - MIC*    CLINDAMYCIN <=0.25 SENSITIVE Sensitive     ERYTHROMYCIN >=8 RESISTANT Resistant     GENTAMICIN <=0.5 SENSITIVE Sensitive     LEVOFLOXACIN >=8 RESISTANT Resistant     OXACILLIN >=4 RESISTANT Resistant     PENICILLIN >=0.5 RESISTANT Resistant     RIFAMPIN <=0.5 SENSITIVE Sensitive     TRIMETH/SULFA >=320 RESISTANT Resistant     VANCOMYCIN 1 SENSITIVE Sensitive     TETRACYCLINE <=1 SENSITIVE Sensitive     * MODERATE METHICILLIN RESISTANT  STAPHYLOCOCCUS AUREUS  MRSA PCR Screening     Status: None   Collection Time: 02/21/14  4:11 AM  Result Value Ref Range Status   MRSA by PCR NEGATIVE NEGATIVE Final    Comment:        The GeneXpert MRSA Assay (FDA approved for NASAL specimens only), is one component of a comprehensive MRSA colonization surveillance program. It is not intended to diagnose MRSA infection nor to guide or monitor treatment for MRSA infections.     Anti-infectives    Start     Dose/Rate Route Frequency Ordered Stop   02/21/14 2200  rifampin (RIFADIN) capsule 300 mg     300 mg Oral Every 12 hours 02/21/14 2015     02/21/14 1049  polymyxin B 500,000 Units, bacitracin 50,000 Units in sodium chloride irrigation 0.9 % 500 mL irrigation  Status:  Discontinued       As needed 02/21/14 1050 02/21/14 1209   02/16/14 2000  ceFEPIme (MAXIPIME) 1 g in dextrose 5 % 50 mL IVPB  Status:  Discontinued     1 g100 mL/hr over 30 Minutes Intravenous Every 12 hours 02/16/14 0938 02/17/14 1357   02/14/14 0730  ceFEPIme (MAXIPIME) 1 g in dextrose 5 % 50 mL IVPB  Status:  Discontinued     1 g100 mL/hr over 30 Minutes Intravenous Every 24 hours 02/13/14 1035 02/16/14 0938   02/12/14 1000  oseltamivir (TAMIFLU) capsule 75 mg  Status:  Discontinued     75 mg Oral 2 times daily 02/12/14 0743 02/12/14 0747   02/12/14 1000  oseltamivir (TAMIFLU) capsule 30 mg  Status:  Discontinued     30 mg Oral 2 times daily 02/12/14 0749 02/16/14 0826   02/12/14 0600  vancomycin (VANCOCIN) IVPB 750 mg/150 ml premix  Status:  Discontinued     750 mg150 mL/hr over 60 Minutes Intravenous Every 12 hours 02/11/14 2047 02/23/14 1038   02/12/14 0000  ceFEPIme (MAXIPIME) 1 g in dextrose 5 % 50 mL IVPB  Status:  Discontinued     1 g100 mL/hr over 30 Minutes Intravenous Every 8 hours 02/11/14 2047 02/13/14 1035   02/11/14 1545  vancomycin (VANCOCIN) 1,500 mg in sodium chloride 0.9 % 500 mL IVPB     1,500 mg250 mL/hr over 120 Minutes Intravenous  Once  02/11/14 1537 02/11/14 1940   02/11/14 1545  ceFEPIme (MAXIPIME) 2 g in dextrose 5 % 50 mL IVPB     2 g100 mL/hr over 30 Minutes Intravenous  Once 02/11/14 1537 02/11/14 1642      Assessment: 63 yoM on Day # 13 /42 of vancomycin for MRSA wound Scr jumped today to 3.10, K also elevated at 5.2 Afebrile  Goal of Therapy:  Vancomycin trough level 15-20 mcg/ml  Plan:  Will hold vancomycin for now -- last dose 6 am 1/10 Check a random vancomycin level in the AM and readjust regimen  Thank you. Okey Regal, PharmD [phone] 1/10/201610:44 AM

## 2014-02-23 NOTE — Progress Notes (Signed)
#  14 foley catheter inserted without difficulty, orange/rust urine return.

## 2014-02-23 NOTE — Progress Notes (Signed)
POD#2 gracilis muscle flap to right groin  Increased BUN/Cr and K today Has not been OOB Does not report and urge to urinate  Temp:  [98.4 F (36.9 C)-99.1 F (37.3 C)] 98.4 F (36.9 C) (01/10 0631) Pulse Rate:  [69-70] 69 (01/10 0631) Resp:  [18] 18 (01/09 2144) BP: (138-170)/(61-71) 155/71 mmHg (01/10 0631) SpO2:  [77 %-95 %] 95 % (01/10 0631)   JP 145/90 UO 250 (105 for prior 24 hrs)   PE Right thigh edematous, no hematoma noted Dressings changed, both staple lines dry intact  A/P Dry dressings to groin and thigh donor site daily New elevated Cr- bladder scan with appr 200 cc   Replace foley Spoke with pharmacy re: Vanc adjustment Also started on Rifampin 2 days ago- primary team will discuss with ID  Not on Lovenox/DVT px- given new elevated Cr likely cannot start today.  Glenna FellowsBrinda Haizel Gatchell, MD [facility] [phone]

## 2014-02-24 ENCOUNTER — Inpatient Hospital Stay (HOSPITAL_COMMUNITY): Payer: Medicare HMO

## 2014-02-24 ENCOUNTER — Encounter (HOSPITAL_COMMUNITY): Payer: Self-pay | Admitting: Plastic Surgery

## 2014-02-24 DIAGNOSIS — R6510 Systemic inflammatory response syndrome (SIRS) of non-infectious origin without acute organ dysfunction: Secondary | ICD-10-CM | POA: Insufficient documentation

## 2014-02-24 DIAGNOSIS — A4902 Methicillin resistant Staphylococcus aureus infection, unspecified site: Secondary | ICD-10-CM | POA: Insufficient documentation

## 2014-02-24 LAB — BASIC METABOLIC PANEL
ANION GAP: 11 (ref 5–15)
BUN: 43 mg/dL — AB (ref 6–23)
CALCIUM: 8.2 mg/dL — AB (ref 8.4–10.5)
CO2: 22 mmol/L (ref 19–32)
Chloride: 94 mEq/L — ABNORMAL LOW (ref 96–112)
Creatinine, Ser: 3.88 mg/dL — ABNORMAL HIGH (ref 0.50–1.35)
GFR calc non Af Amer: 15 mL/min — ABNORMAL LOW (ref >90)
GFR, EST AFRICAN AMERICAN: 17 mL/min — AB (ref >90)
GLUCOSE: 152 mg/dL — AB (ref 70–99)
Potassium: 5.1 mmol/L (ref 3.5–5.1)
SODIUM: 127 mmol/L — AB (ref 135–145)

## 2014-02-24 LAB — VANCOMYCIN, RANDOM: Vancomycin Rm: 39.9 ug/mL

## 2014-02-24 LAB — GLUCOSE, CAPILLARY
GLUCOSE-CAPILLARY: 174 mg/dL — AB (ref 70–99)
GLUCOSE-CAPILLARY: 167 mg/dL — AB (ref 70–99)
Glucose-Capillary: 186 mg/dL — ABNORMAL HIGH (ref 70–99)
Glucose-Capillary: 251 mg/dL — ABNORMAL HIGH (ref 70–99)

## 2014-02-24 MED ORDER — HALOPERIDOL 2 MG PO TABS
2.0000 mg | ORAL_TABLET | Freq: Four times a day (QID) | ORAL | Status: DC | PRN
  Administered 2014-02-24 (×2): 2 mg via ORAL
  Filled 2014-02-24 (×3): qty 1

## 2014-02-24 NOTE — Consult Note (Signed)
Referring Provider: No ref. provider found Primary Care Physician:  Milinda Antis, MD Primary Nephrologist:     Reason for Consultation:   Acute kidney injury , Hyperkalemia and hyponatremia and anemia HPI: 64 year old male with past medical history a recent aorta femoral bypass, who came in with acute hypoxic respiratory failure, sepsis and elevated cardiac enzymes. Started on empiric antibiotics. CT imaging of the Chest was done in ED showed no PE.    Groin ultrasound showed abscess, vascular surgery consulted recommended a CT scan of the abdomen and pelvis with contrast emergently on 02/14/2014 which showed multiple loculated abscesses and possible infected native graft, he is status post I&D and possible graft infection on 02/15/2014. Blood cultures negative but abscess cultures are growing MRSA, continued on vancomycin and cefepime was discontinued. He will need an ischemic evaluation.   Plastic surgery was consulted by vascular surgery, anticipate surgery later this week after myocardial perfusion scan and depending on cardiac recommendations.  Renal function creatinine started to increase             1/8   1/10 and 1/11                                                               Creatinine         1.48 ---   3.1  --- 3.9         Random vancomycin 39.9   1/11  Blood pressure appears controlled there does not appear to be any episodes of shock or hypotension  Past Medical History  Diagnosis Date  . Arteriosclerotic cardiovascular disease (ASCVD)     CABG in 2003 for 3 VD; 07/2007: Stress nuclear-inferior infarction without ischemia; normal EF by echo in 06/2005  . Diabetes mellitus type II   . Hyperlipidemia   . Degenerative joint disease     Left knee  . Cerebrovascular disease 2003    Carotid ultrasound in 2010: Patent right CEA site, 60-79% left internal carotid artery  . Peripheral vascular disease 2007    aortobifemoral BPG-2007  . Anemia   . Peripheral neuropathy   .  Hypertension   . Sick sinus syndrome 1996    permanent pacemaker-1996; generator change in 2010  . Chronic obstructive pulmonary disease   . Obesity   . Obstructive sleep apnea   . Benign prostatic hypertrophy   . Gastroesophageal reflux disease   . Tobacco abuse, in remission     50 pack years total consumption; Quit in 2007    Past Surgical History  Procedure Laterality Date  . Coronary artery bypass graft  2003    three-vessel disease  . Iliac artery stent      Percutaneous intervention-right external iliac; unclear whether stent was placed  . Pacemaker insertion  1996    [employer] Accent DR dual-chamber device, serial number [id number]   . Carotid endarterectomy  2003    Right  . Pacemaker generator change  2010     St. Jude Accent DR dual-chamber device, serial number [id number]   . Aorta - bilateral femoral artery bypass graft  2007  . Femoral-popliteal bypass graft  1998    bilateral  . Colonoscopy  2012    Negative screening study.  . Ileocolonoscopy  02/20/2007    WUJ:WJXBJY rectum, colon,  and terminal ileum  . Femoral-femoral bypass graft Bilateral 01/03/2014    Procedure: BYPASS GRAFT RIGHT FEMORAL-LEFT FEMORAL ARTERY ;  Surgeon: Chuck Hint, MD;  Location: [facility] OR;  Service: Vascular;  Laterality: Bilateral;  . Endarterectomy femoral Left 01/03/2014    Procedure: ENDARTERECTOMY FEMORAL;  Surgeon: Chuck Hint, MD;  Location[facility] OR;  Service: Vascular;  Laterality: Left;  . Intraoperative arteriogram Left 01/03/2014    Procedure: INTRA OPERATIVE ARTERIOGRAM, LEFT LOWER LEG;  Surgeon: Chuck Hint, MD;  Location: [facility] OR;  Service: Vascular;  Laterality: Left;  . Abdominal angiogram N/A 01/02/2014    Procedure: ABDOMINAL ANGIOGRAM;  Surgeon: Fransisco Hertz, MD;  Location: [facility] CATH LAB;  Service: Cardiovascular;  Laterality: N/A;  . Peripheral vascular catheterization  01/02/2014    Procedure: LOWER EXTREMITY ANGIOGRAPHY;  Surgeon: Fransisco Hertz, MD;   Location: [facility] CATH LAB;  Service: Cardiovascular;;  . Arch aortogram  01/02/2014    Procedure: ARCH AORTOGRAM;  Surgeon: Fransisco Hertz, MD;  Location: [facility] CATH LAB;  Service: Cardiovascular;;  . Wound exploration Right 02/15/2014    Procedure: Incision and Drainage Right Groin;  Surgeon: Sherren Kerns, MD;  Location: [facility] OR;  Service: Vascular;  Laterality: Right;  . Left heart catheterization with coronary/graft angiogram N/A 02/20/2014    Procedure: LEFT HEART CATHETERIZATION WITH Isabel Caprice;  Surgeon: Micheline Chapman, MD;  Location: [facility] CATH LAB;  Service: Cardiovascular;  Laterality: N/A;    Prior to Admission medications   Medication Sig Start Date End Date Taking? Authorizing Provider  aspirin 81 MG tablet Take 1 tablet (81 mg total) by mouth daily. 05/08/13  Yes Salley Scarlet, MD  ferrous sulfate 325 (65 FE) MG tablet Take 325 mg by mouth 2 (two) times daily. 02/01/14  Yes Historical Provider, MD  gabapentin (NEURONTIN) 300 MG capsule Take 1 capsule (300 mg total) by mouth 3 (three) times daily. 06/21/13  Yes Salley Scarlet, MD  Insulin Detemir (LEVEMIR FLEXTOUCH) 100 UNIT/ML Pen Inject 45 Units into the skin daily at 10 pm. Patient taking differently: Inject 50 Units into the skin daily at 10 pm.  10/24/13  Yes Salley Scarlet, MD  insulin lispro (HUMALOG) 100 UNIT/ML KiwkPen Inject 12 Units into the skin 3 (three) times daily with meals. 06/24/13  Yes Salley Scarlet, MD  lisinopril (PRINIVIL,ZESTRIL) 5 MG tablet Take 5 mg by mouth daily. 02/01/14  Yes Historical Provider, MD  metoprolol (LOPRESSOR) 50 MG tablet Take 25 mg by mouth 2 (two) times daily. 02/01/14 02/16/14 Yes Historical Provider, MD  simvastatin (ZOCOR) 10 MG tablet Take 1 tablet (10 mg total) by mouth at bedtime. 06/21/13  Yes Salley Scarlet, MD  sulfamethoxazole-trimethoprim (BACTRIM DS,SEPTRA DS) 800-160 MG per tablet Take 1 tablet by mouth 2 (two) times daily. 02/10/14  Yes Salley Scarlet, MD  traMADol  (ULTRAM) 50 MG tablet Take by mouth every 6 (six) hours as needed.   Yes Historical Provider, MD  triamcinolone cream (KENALOG) 0.1 % Apply 1 application topically 2 (two) times daily. Apply to leg 08/28/13  Yes Salley Scarlet, MD  acetaminophen (TYLENOL) 500 MG tablet Take 2 tablets (1,000 mg total) by mouth every 6 (six) hours as needed. Pain. 05/08/13   Salley Scarlet, MD  albuterol (PROVENTIL HFA;VENTOLIN HFA) 108 (90 BASE) MCG/ACT inhaler Inhale 2 puffs into the lungs every 4 (four) hours as needed. 06/21/13   Salley Scarlet, MD  BAYER CONTOUR NEXT TEST test strip USE TO  TEST BLOOD SUGAR LEVELS THREE TIMES DAILY 02/03/14   Salley Scarlet, MD  cloNIDine (CATAPRES - DOSED IN MG/24 HR) 0.1 mg/24hr patch Place 0.1 mg onto the skin once a week.    Historical Provider, MD  metoprolol tartrate (LOPRESSOR) 25 MG tablet Take 1 tablet (25 mg total) by mouth 2 (two) times daily. Patient not taking: Reported on 02/11/2014 06/21/13   Salley Scarlet, MD    Current Facility-Administered Medications  Medication Dose Route Frequency Provider Last Rate Last Dose  . acetaminophen (TYLENOL) tablet 1,000 mg  1,000 mg Oral Q6H PRN Wilson Singer, MD   1,000 mg at 02/14/14 0025  . antiseptic oral rinse (CPC / CETYLPYRIDINIUM CHLORIDE 0.05%) solution 7 mL  7 mL Mouth Rinse BID Marinda Elk, MD   7 mL at 02/23/14 2227  . aspirin chewable tablet 81 mg  81 mg Oral Daily Nimish C Karilyn Cota, MD   81 mg at 02/24/14 0932  . atorvastatin (LIPITOR) tablet 80 mg  80 mg Oral q1800 Leeann Must, MD   80 mg at 02/23/14 1720  . cloNIDine (CATAPRES - Dosed in mg/24 hr) patch 0.1 mg  0.1 mg Transdermal Weekly Cristal Ford, MD   0.1 mg at 02/18/14 1100  . ferrous sulfate tablet 325 mg  325 mg Oral BID Wilson Singer, MD   325 mg at 02/24/14 0931  . gabapentin (NEURONTIN) capsule 300 mg  300 mg Oral BID Joseph Art, DO   300 mg at 02/24/14 0931  . hydrALAZINE (APRESOLINE) injection 10 mg  10 mg Intravenous Q6H PRN  Rolan Lipa, NP   10 mg at 02/22/14 0039  . hydrALAZINE (APRESOLINE) tablet 50 mg  50 mg Oral 3 times per day Laqueta Linden, MD   50 mg at 02/24/14 1610  . insulin aspart (novoLOG) injection 0-20 Units  0-20 Units Subcutaneous TID WC Wilson Singer, MD   4 Units at 02/24/14 0935  . insulin aspart (novoLOG) injection 0-5 Units  0-5 Units Subcutaneous QHS Wilson Singer, MD   2 Units at 02/20/14 2323  . insulin detemir (LEVEMIR) injection 25 Units  25 Units Subcutaneous Q2200 Dayna N Dunn, PA-C   25 Units at 02/23/14 2228  . ipratropium-albuterol (DUONEB) 0.5-2.5 (3) MG/3ML nebulizer solution 3 mL  3 mL Nebulization Q4H PRN Nimish Normajean Glasgow, MD   3 mL at 02/14/14 0533  . isosorbide mononitrate (IMDUR) 24 hr tablet 30 mg  30 mg Oral Daily Marinda Elk, MD   30 mg at 02/24/14 0932  . metoprolol tartrate (LOPRESSOR) tablet 25 mg  25 mg Oral BID Wilson Singer, MD   25 mg at 02/24/14 0932  . ondansetron (ZOFRAN) tablet 4 mg  4 mg Oral Q6H PRN Nimish Normajean Glasgow, MD   4 mg at 02/12/14 1151   Or  . ondansetron (ZOFRAN) injection 4 mg  4 mg Intravenous Q6H PRN Nimish C Gosrani, MD      . rifampin (RIFADIN) capsule 300 mg  300 mg Oral Q12H Randall Hiss, MD   300 mg at 02/24/14 0932  . sodium chloride 0.9 % injection 10-40 mL  10-40 mL Intracatheter PRN Joseph Art, DO   10 mL at 02/23/14 2118  . tamsulosin (FLOMAX) capsule 0.4 mg  0.4 mg Oral Daily Joseph Art, DO   0.4 mg at 02/24/14 0932  . traMADol (ULTRAM) tablet 50 mg  50 mg Oral Q6H PRN Nimish Normajean Glasgow, MD  50 mg at 02/23/14 2227  . triamcinolone cream (KENALOG) 0.1 % 1 application  1 application Topical BID Wilson Singer, MD   1 application at 02/23/14 2230    Allergies as of 02/11/2014 - Review Complete 02/11/2014  Allergen Reaction Noted  . Morphine and related  01/04/2014    Family History  Problem Relation Age of Onset  . Dementia Mother   . Heart attack Father   . Coronary artery disease  Father   . Peripheral vascular disease Sister   . Diabetes Brother   . Peripheral vascular disease Brother   . Hypertension Brother   . Coronary artery disease Brother     History   Social History  . Marital Status: Married    Spouse Name: N/A    Number of Children: N/A  . Years of Education: 9   Occupational History  . 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 Smoking status: Former Smoker -- 2.00 packs/day for 25 years    Types: Cigarettes    Quit date: 01/22/2005  . Smokeless tobacco: Former Neurosurgeon    Quit date: 02/16/1996  . Alcohol Use: No  . Drug Use: No  . Sexual Activity: Not Currently   Other Topics Concern  . 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 Normal external appearance No Epistaxis or Sore throat. No sinusitis.   CV: Denies chest pain, angina, palpitations, syncope, orthopnea, PND, peripheral edema, and claudication. Resp: Denies dyspnea at rest, dyspnea with exercise, cough, sputum, wheezing, coughing up blood, and pleurisy. GI: Denies vomiting blood, jaundice, and fecal incontinence.   Denies dysphagia or odynophagia. GU : foley catheter  MS: Denies joint pain, limitation of movement, and swelling, stiffness, low back pain, extremity pain. Denies muscle weakness, cramps, atrophy.  No use of non steroidal antiinflammatory drugs. Derm: Denies rash, itching, dry skin, hives, moles, warts, or unhealing ulcers.  Psych: Denies depression, anxiety, memory loss, suicidal ideation, hallucinations, paranoia, and confusion. Heme: Denies bruising, bleeding, and enlarged lymph nodes. Neuro: No headache.  No diplopia. No dysarthria.  No dysphasia.  No history of CVA.  No Seizures. No paresthesias.  No weakness. Endocrine  DM.  No  Thyroid disease.  No Adrenal disease.  Physical Exam: Vital signs in last 24 hours: Temp:  [97.3 F (36.3 C)-99 F (37.2 C)] 97.4 F (36.3 C) (01/11 0550) Pulse Rate:  [70-71] 71 (01/11 0550) Resp:  [18] 18 (01/10 2109) BP: (146-177)/(68-87) 146/73 mmHg (01/11 0550) SpO2:  [85 %-98 %] 93 % (01/11 0824) Weight:  [95.5 kg (210 lb 8.6 oz)] 95.5 kg (210 lb 8.6 oz) (01/10 1516) Last BM Date: 02/21/14 General:   Alert,  Well-developed, well-nourished, pleasant and cooperative in NAD Head:  Normocephalic and atraumatic. Eyes:  Sclera clear, no icterus.   Conjunctiva pink. Ears:  Normal auditory acuity. Nose:  No deformity, discharge,  or lesions. Mouth:  No deformity or lesions, dentition normal. Neck:  Supple; no masses or thyromegaly. JVP not elevated Lungs:  Clear throughout to auscultation.   No wheezes, crackles, or rhonchi. No acute distress. Heart:  Regular rate and rhythm; no murmurs, clicks, rubs,  or gallops.AV paced  Abdomen:  Distended although BS present   Msk:  Symmetrical without gross deformities. Normal posture. Pulses:  No carotid, renal, femoral bruits. DP and PT symmetrical and equal Extremities:  Without clubbing  Trace edema Neurologic:  Alert and  oriented x4;  grossly normal neurologically. Skin:  dressing of right groin clean and dry.   Intake/Output from previous day: 01/10 0701 - 01/11 0700 In: 840 [P.O.:840] Out: 502.5 [Urine:300; Drains:202.5] Intake/Output this shift: Total I/O In: 360 [P.O.:360] Out: 685 [Urine:625; Drains:60]  Lab Results:  Recent Labs  02/23/14 0310  WBC 10.7*  HGB 8.4*  HCT 27.5*  PLT 421*   BMET  Recent Labs  02/23/14 0310 02/24/14 0605  NA 131* 127*  K 5.2* 5.1  CL 98 94*  CO2 25 22  GLUCOSE 157* 152*  BUN 34* 43*  CREATININE 3.10* 3.88*  CALCIUM 8.4 8.2*   LFT No results for input(s): PROT, ALBUMIN, AST, ALT, ALKPHOS, BILITOT, BILIDIR, IBILI in the last 72 hours. PT/INR No results for input(s): LABPROT,  INR in the last 72 hours. Hepatitis Panel No results for input(s): HEPBSAG, HCVAB, HEPAIGM, HEPBIGM in the last 72 hours.  Studies/Results: Dg Chest Port 1 View  02/24/2014   CLINICAL DATA:  Lethargy and disorientation; history of coronary artery disease and peripheral vascular disease, hypertension, and diabetes  EXAM: PORTABLE CHEST - 1 VIEW  COMPARISON:  Portable chest x-ray dated February 12, 2014  FINDINGS: The lungs are mildly hypoinflated. The interstitial markings are increased bilaterally. The cardiac silhouette is enlarged. The pulmonary vascularity is engorged. The retrocardiac region is dense. The hemidiaphragms are mildly blunted. The permanent pacemaker is in appropriate position radiographically. There are post CABG changes.  IMPRESSION: Congestive heart failure with moderate interstitial edema and small bilateral pleural effusions.   Electronically Signed   By: David  [place]   On: 02/24/2014 08:43    Assessment/Plan: 1.Acute kidney Injury  There are multiple reasons that need to be considered. The vancomycin could cause acute interstitial nephritis or acute tubular necrosis and I think that this is quite plausible. The use of IV contrast and catheterization raise the specter of contrast nephropathy or an atheroembolic shower . A blocked foley catheter could also be possible and this should be relatively easy to diagnose and rectify 2. Hyponatremia  Probably secondary to excess water intake with worsening renal function 3. Hyperkalemia will follow no contributing drugs identified 4. Anemia will follow would not want to use iron with ongoing infection  LOS: 13 Shimika Ames W @TODAY @12 :53 PM

## 2014-02-24 NOTE — Progress Notes (Signed)
[facility] PROGRESS NOTE Interim history: 64 year old male with past medical history a recent aorta femoral bypass, who came in with acute hypoxic respiratory failure, sepsis and elevated cardiac enzymes. Started on empiric antibiotics. CT imaging of the Chest was done in ED showed no PE. He also developed acute kidney injury probably due to sepsis, ACE inhibitor and contrast-induced nephropathy which resolved. Groin ultrasound showed abscess, vascular surgery consulted recommended a CT scan of the abdomen and pelvis with contrast emergently on 02/14/2014 which showed multiple loculated abscesses and possible infected native graft, he is  status post I&D and possible graft infection on 02/15/2014. Blood cultures negative but abscess cultures are growing MRSA, continued on vancomycin and cefepime was discontinued.  He will need an ischemic evaluation.  He was transfused 1 unit of PRBC.  Given clinical improvement, he was transferred out of stepdown to telemetry on 02/17/2014.  Plastic surgery was consulted by vascular surgery, anticipate surgery later this week after myocardial perfusion scan and depending on cardiac recommendations.   Filed Weights   02/11/14 2059 02/12/14 0535 02/23/14 1516  Weight: 90 kg (198 lb 6.6 oz) 88.2 kg (194 lb 7.1 oz) 95.5 kg (210 lb 8.6 oz)        Intake/Output Summary (Last 24 hours) at 02/24/14 1610 Last data filed at 02/23/14 2059  Gross per 24 hour  Intake    840 ml  Output  502.5 ml  Net  337.5 ml     Assessment/Plan: Sepsis/Due to R groin abscess with infected graft: - Cultures growing MRSA, continue on vancomycin.  - Ct angio of graft showed multiple abscess and possible infected native graft. Vascular and Plastic Surgery, onboard.  S/p surgery 1/8 - Blood cultures negative and final. - S/p I&D of groin abscess, cultures from 02/15/2013 MRSA. - ID to help with abx  Acute respiratory failure with hypoxia - Likely due to sepsis of the groin and  demand ischemia.  Resolved.  AKI (acute kidney injury)/acute toxic nephropathy: - Multifactorial due to sepsis, ACE inhibitor and contrast-induced nephropathy. ACE held. -renal consult ? If abx are causing AKI- placed foley, no obstruction +6L since admission if I/Os are correct- ?if lasix needed- defer to nephrology  Sleepiness -check ABG -sun downed last PM- did not sleep well -chest x ray  Elevated Troponin's: - Cont aspirin, and beta blockers. Cardiology consulted -Abnormal stress test followed by cardiac cath 1/7 showed patent all bypass grafts and cardiology states low risk of major ischemic events related to surgery and no further cardiac testing at this point.  Hyponatremia -monitor  Chronic obstructive pulmonary disease - Stable.  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  Essential hypertension - Mildly uncontrolled and fluctuating.  -hydralazine added 1/9  Normocytic anemia: - S/p 2 units of packed red blood cells. - will transfuse for < 8 due to cardiac hx - Stable.   Code Status: Full code  DVT Prophylaxis: SCDs. Family Communication: patient/wife Disposition Plan: SNF?   Consultants:  Cardiology  Vascular Surgery  Plastic Surgery  ID  Procedures: ECHO: EF normal no wall motion. Pelvic US 12.30.2015: Large 11.4 x 10.0 x 4.6 cm right groin complex fluid collection noted  LHC 02/20/14  Antibiotics:  Vancomycin 02/11/2014   Cefepime 02/11/2014 -> 02/17/2013.  HPI/Subjective: Nursing reports possible confusion last PM -sleepy this AM but will awake and answer questions  Objective: Filed Vitals:   02/24/14 9604 02/24/14 0550 02/24/14 5409 02/24/14 8119  BP: 150/68 146/73    Pulse:  71    Temp:  97.4 F (36.3 C)    TempSrc:  Oral    Resp:      Height:      Weight:      SpO2:  97% 85% 93%     Exam:  Physical Exam: General: Awake, speech difficult to  understand (baseline per family) CV: S1 and S2, RRR. No JVD or murmurs.  Lungs: Clear to ascultation bilaterally Abdomen: Soft, Nontender, Nondistended, +bowel sounds. Ext: Good pulses. Trace edema. Skin: dressing of right groin clean and dry.   Data Reviewed: Basic Metabolic Panel:  Recent Labs Lab 02/19/14 0508 02/20/14 0500 02/21/14 0350 02/23/14 0310 02/24/14 0605  NA 137 138 135 131* 127*  K 3.8 4.1 4.2 5.2* 5.1  CL 101 101 100 98 94*  CO2 32 33* GLUCOSE 150* 160* 221* 157* 152*  BUN 34* 43*  CREATININE 1.21 1.25 1.48* 3.10* 3.88*  CALCIUM 8.6 8.4 8.3* 8.4 8.2*   Liver Function Tests: No results for input(s): AST, ALT, ALKPHOS, BILITOT, PROT, ALBUMIN in the last 168 hours. No results for input(s): LIPASE, AMYLASE in the last 168 hours. No results for input(s): AMMONIA in the last 168 hours. CBC:  Recent Labs Lab 02/18/14 0450 02/19/14 0508 02/20/14 0500 02/21/14 0350 02/23/14 0310  WBC 7.9 10.5 11.2* 11.0* 10.7*  HGB 9.4* 9.0* 8.7* 8.6* 8.4*  HCT 30.1* 29.7* 29.0* 28.8* 27.5*  MCV 86.0 84.6 84.8 84.2 83.3  PLT 325 386 420* 410* 421*   Cardiac Enzymes: No results for input(s): CKTOTAL, CKMB, CKMBINDEX, TROPONINI in the last 168 hours. BNP (last 3 results) No results for input(s): PROBNP in the last 8760 hours. CBG:  Recent Labs Lab 02/23/14 0804 02/23/14 1145 02/23/14 1638 02/23/14 2224 02/24/14 0807  GLUCAP 174* 128* 114* 146* 186*    Recent Results (from the past 240 hour(s))  Wound culture     Status: None   Collection Time: 02/14/14 10:08 AM  Result Value Ref Range Status   Specimen Description WOUND GROIN RIGHT  Final   Special Requests NONE  Final   Gram Stain   Final    NO WBC SEEN NO SQUAMOUS EPITHELIAL CELLS SEEN NO ORGANISMS SEEN Performed at [facility]    Culture   Final    NO GROWTH 3 DAYS Performed at [facility]    Report Status 02/17/2014 FINAL  Final  Body fluid culture     Status:  None   Collection Time: 02/15/14  9:49 AM  Result Value Ref Range Status   Specimen Description FLUID RIGHT DRAINAGE GROIN  Final   Special Requests NONE  Final   Gram Stain   Final    ABUNDANT WBC PRESENT, PREDOMINANTLY PMN RARE GRAM POSITIVE COCCI IN PAIRS Performed at [facility]    Culture   Final    MODERATE METHICILLIN RESISTANT STAPHYLOCOCCUS AUREUS Note: RIFAMPIN AND GENTAMICIN SHOULD NOT BE USED AS SINGLE DRUGS FOR TREATMENT OF STAPH INFECTIONS. This organism DOES NOT demonstrate inducible Clindamycin resistance in vitro. CRITICAL RESULT CALLED TO, READ BACK BY AND VERIFIED WITH: CAROL@8 :58AM ON  02/17/13 BY DANTS Performed at [facility]    Report Status 02/17/2014 FINAL  Final   Organism ID, Bacteria METHICILLIN RESISTANT STAPHYLOCOCCUS AUREUS  Final      Susceptibility   Methicillin resistant staphylococcus aureus - MIC*    CLINDAMYCIN <=0.25 SENSITIVE Sensitive  ERYTHROMYCIN >=8 RESISTANT Resistant     GENTAMICIN <=0.5 SENSITIVE Sensitive     LEVOFLOXACIN >=8 RESISTANT Resistant     OXACILLIN >=4 RESISTANT Resistant     PENICILLIN >=0.5 RESISTANT Resistant     RIFAMPIN <=0.5 SENSITIVE Sensitive     TRIMETH/SULFA >=320 RESISTANT Resistant     VANCOMYCIN 1 SENSITIVE Sensitive     TETRACYCLINE <=1 SENSITIVE Sensitive     * MODERATE METHICILLIN RESISTANT STAPHYLOCOCCUS AUREUS  MRSA PCR Screening     Status: None   Collection Time: 02/21/14  4:11 AM  Result Value Ref Range Status   MRSA by PCR NEGATIVE NEGATIVE Final    Comment:        The GeneXpert MRSA Assay (FDA approved for NASAL specimens only), is one component of a comprehensive MRSA colonization surveillance program. It is not intended to diagnose MRSA infection nor to guide or monitor treatment for MRSA infections.      Studies: No results found.  Scheduled Meds: . antiseptic oral rinse  7 mL Mouth Rinse BID  . aspirin  81 mg Oral Daily  . atorvastatin  80 mg Oral q1800  .  cloNIDine  0.1 mg Transdermal Weekly  . ferrous sulfate  325 mg Oral BID  . gabapentin  300 mg Oral BID  . hydrALAZINE  50 mg Oral 3 times per day  . insulin aspart  0-20 Units Subcutaneous TID WC  . insulin aspart  0-5 Units Subcutaneous QHS  . insulin detemir  25 Units Subcutaneous Q2200  . isosorbide mononitrate  30 mg Oral Daily  . metoprolol  25 mg Oral BID  . mupirocin ointment  1 application Nasal BID  . rifampin  300 mg Oral Q12H  . tamsulosin  0.4 mg Oral Daily  . triamcinolone cream  1 application Topical BID   Continuous Infusions:    Time: 25 minutes.  Chalmers CaterVANN, Shali Vesey,DO [facility] Pager [phone] If 7PM-7AM, please contact night-coverage [email or web address] Password [email or web address] 02/24/2014, 8:28 AM      LOS: 13 days

## 2014-02-24 NOTE — Progress Notes (Signed)
   VASCULAR SURGERY ASSESSMENT & PLAN:  * Doing well status post gracilis flap by Dr. Leta Baptisthimmappa.  *  I will continue to follow peripherally.  SUBJECTIVE: No complaints  PHYSICAL EXAM: Filed Vitals:   02/24/14 0033 02/24/14 0550 02/24/14 0823 02/24/14 0824  BP: 150/68 146/73    Pulse:  71    Temp:  97.4 F (36.3 C)    TempSrc:  Oral    Resp:      Height:      Weight:      SpO2:  97% 85% 93%   Right groin dressing dry  LABS: Lab Results  Component Value Date   WBC 10.7* 02/23/2014   HGB 8.4* 02/23/2014   HCT 27.5* 02/23/2014   MCV 83.3 02/23/2014   PLT 421* 02/23/2014   Lab Results  Component Value Date   CREATININE 3.88* 02/24/2014    CBG (last 3)   Recent Labs  02/23/14 1638 02/23/14 2224 02/24/14 0807  GLUCAP 114* 146* 186*    [id number]Chris Steffani [name] Beeper: [phone] 02/24/2014

## 2014-02-24 NOTE — Progress Notes (Signed)
Subjective: Difficult to understand his speech. Eating breakfast. Irritated he needs to be examined.  No chest pain, no SOB  Objective: Vital signs in last 24 hours: Temp:  [97.3 F (36.3 C)-99 F (37.2 C)] 97.4 F (36.3 C) (01/11 0550) Pulse Rate:  [70-71] 71 (01/11 0550) Resp:  [18] 18 (01/10 2109) BP: (146-177)/(68-87) 146/73 mmHg (01/11 0550) SpO2:  [95 %-98 %] 97 % (01/11 0550) Weight:  [210 lb 8.6 oz (95.5 kg)] 210 lb 8.6 oz (95.5 kg) (01/10 1516) Weight change:  Last BM Date: 02/21/14 Intake/Output from previous day: -495 01/10 0701 - 01/11 0700 In: 840 [P.O.:840] Out: 502.5 [Urine:300; Drains:202.5] Intake/Output this shift:    PE:  General:Pleasant affect, NAD Skin:Warm and dry, brisk capillary refill HEENT:normocephalic, sclera clear, mucus membranes moist Heart:S1S2 RRR without murmur, gallup, rub or click Lungs:clear without rales, rhonchi, or wheezes JXB:JYNWAbd:soft, non tender, + BS, do not palpate liver sple en or masses Ext:no lower ext edema,Rt groin dressing in place,   Lab Results:  Recent Labs  02/23/14 0310  WBC 10.7*  HGB 8.4*  HCT 27.5*  PLT 421*   BMET  Recent Labs  02/23/14 0310 02/24/14 0605  NA 131* 127*  K 5.2* 5.1  CL 98 94*  CO2 25 22  GLUCOSE 157* 152*  BUN 34* 43*  CREATININE 3.10* 3.88*  CALCIUM 8.4 8.2*     Lab Results  Component Value Date   CHOL 125 12/03/2013   HDL 23* 12/03/2013   LDLCALC 39 12/03/2013   LDLDIRECT 77 09/17/2012   TRIG 317* 12/03/2013   CHOLHDL 5.4 12/03/2013   Lab Results  Component Value Date   HGBA1C 11.1* 12/31/2013     Lab Results  Component Value Date   TSH 1.341 02/11/2014     Studies/Results: No results found.  Medications: I have reviewed the patient's current medications. Scheduled Meds: . antiseptic oral rinse  7 mL Mouth Rinse BID  . aspirin  81 mg Oral Daily  . atorvastatin  80 mg Oral q1800  . cloNIDine  0.1 mg Transdermal Weekly  . ferrous sulfate  325  mg Oral BID  . gabapentin  300 mg Oral BID  . hydrALAZINE  50 mg Oral 3 times per day  . insulin aspart  0-20 Units Subcutaneous TID WC  . insulin aspart  0-5 Units Subcutaneous QHS  . insulin detemir  25 Units Subcutaneous Q2200  . isosorbide mononitrate  30 mg Oral Daily  . metoprolol  25 mg Oral BID  . mupirocin ointment  1 application Nasal BID  . rifampin  300 mg Oral Q12H  . tamsulosin  0.4 mg Oral Daily  . triamcinolone cream  1 application Topical BID   Continuous Infusions:   PRN Meds:.acetaminophen, hydrALAZINE, ipratropium-albuterol, ondansetron **OR** ondansetron (ZOFRAN) IV, sodium chloride, traMADol    Nuc study: IMPRESSION: 1. Medium-sized area of mild reversibility involving the anterior and anterior lateral walls. Suspicious for inducible ischemia. 2. Global hypokinesis with lateral wall focal hypokinesis to dyskinesis. 3. Left ventricular ejection fraction 44% 4. Intermediate-risk stress test findings.  LHC 02/20/13 Final Conclusions:  1. 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 Status post four-vessel CABG with continued patency of all grafts (LIMA to LAD, saphenous vein graft to PDA, saphenous vein graft sequential to OM1 and OM 2)  Recommendations: The patient has patency of all bypass grafts. He should be at low risk of major ischemic  events related to surgery. No further cardiac testing indicated at this point. Will continue with medical therapy as tolerated.   Assessment/Plan: [name] is a 64 y.o. male with a history of COPD, CAD s/p CABG (2003), DM 2, PVD, OSA , SSS s/p pacer who was transferred from [facility] to [facility] on 02/12/14 with acute respiratory failure. He had a recent aorta femoral bypass and was found to have an abscess in his groin s/p I&D of the abscess by Dr. Darrick Penna on 02/15/13. Cardiology was consulted for elevated troponin.  EF 60-65% on echo, mild aortic stenosis. PA pk pressure  was 59 mmHg.  Now with intermediate-risk for ischemia with troponin of 8. He underwent nuclear stress test which returned abnormal and then LHC which revealed 3/4 patent bypass grafts and stable dz. He was cleared for surgery on of infected graft on 02/21/13. Now having issues with worsening renal function.   CAD s/p CABG (2003): Stable ischemic heart disease with continued patency of all grafts (LIMA to LAD, saphenous vein graft to PDA, saphenous vein graft sequential to OM1 and OM 2) noted by cath on 1/7. Echo on 12/30 demonstrated normal LV systolic function, moderate LVH, and diastolic dysfunction.  -- NSTEMI due to demand ischemia during crtitical illness.  -- Continue ASA, Lipitor, Imdur, and metoprolol  Acute kidney injury- He had no UO for almost 48 hours  And there was concern for bladder outlet obstruction or contrast nephrotoxicity from cath on 02/20/2014, although only 75 mL iodinated contrast was used. Note he also had chest CT Angio on 02/11/2014 and abdominal CT angio on 02/14/2014. -- Worsening renal function. 3.88 today.  -- Foley placed with  urine OP. No obstruction per IM. +6L since admission if I/Os are correct- ?if lasix needed- defer to nephrology  Sepsis/Due groin abscess with infected graft: -- ABx. POD #3 s/p gracilis muscle flap to right groin.   HTN- avoiding excessive reduction in BP with acute renal failure, until mechanism is clarified. No further med adjustment today. -- Continue lopressor  BID, imdur . Clonidine patch 0.1mg , and hydralazine  TID.  Abnormal CT abdomen- 3. 13 mm hyperdense cortical lesion of the mid lateral left renal cortex. Followup is recommended. -- Per IM  SSS s/p PPM- No further PMT since PVARP adjustment  Normocytic anemia: -- S/p 2 units of packed red blood cells. Hg stable at 8.4  Time spent with pt. :15 minutes. Cline Crock R  PA-C 02/24/2014, 8:05 AM

## 2014-02-24 NOTE — Clinical Social Work Note (Signed)
Per RNCM, patient plans to discharge home with HHPT.    Roddie McBryant Musa Rewerts MSW, HendersonLCSWA, [place]AS[place], [phone]

## 2014-02-24 NOTE — Progress Notes (Signed)
Physical Therapy Treatment Patient Details Name: Collin LeitzWilliam F Mchale MRN: [id number] DOB: 08/16/1950 Today's Date: 02/24/2014    History of Present Illness Patient is a 64 yo male admitted 02/11/14 with acute hypoxic respiratory failure, sepsis and elevated cardiac enzymes.  CT showed Rt groin abscesses and possible graft infection.  Patient s/p I&D on 02/15/14, and s/p muscle flap on 02/21/14.   PMH:  Aortofemoral BPG, DM, COPD, HTN, ASCVD, CABG, PVD, peripheral neuropathy    PT Comments    Patient requiring +2 mod to max assist for all mobility and transfers. (Recommended to nursing to use lift equipment to return patient to bed from chair.)  Patient continues to have decreased cognition.  Continue to recommend SNF for continued therapy at discharge.   Follow Up Recommendations  SNF;Supervision/Assistance - 24 hour     Equipment Recommendations  3in1 (PT)    Recommendations for Other Services       Precautions / Restrictions Precautions Precautions: Fall Precaution Comments: Patient reaching for and pulling drains Restrictions Weight Bearing Restrictions: No    Mobility  Bed Mobility Overal bed mobility: Needs Assistance Bed Mobility: Supine to Sit     Supine to sit: Max assist     General bed mobility comments: Repeated verbal and tactile cues for mobility.  Assist to raise trunk to sitting, and move hips to EOB using bed pads.  Once at EOB, patient able to maintain sitting balance.    Transfers Overall transfer level: Needs assistance Equipment used: Rolling walker (2 wheeled);None Transfers: Sit to/from UGI CorporationStand;Stand Pivot Transfers Sit to Stand: Max assist;+2 physical assistance Stand pivot transfers: Max assist;+2 physical assistance       General transfer comment: Repeated verbal and tactile cues for hand placement.  Required +2 assist to rise to standing.  Unable to get hips and knees fully extended.  Cues to bear weight through UE's on RW to help stand upright.   Stood x 25 seconds and knees flexed - patient sat on bed.   Performed stand pivot transfer bed > chair with +2 hand hold max assist.  Difficulty taking steps to pivot.  Ambulation/Gait             General Gait Details: Unable   Stairs            Wheelchair Mobility    Modified Rankin (Stroke Patients Only)       Balance           Standing balance support: Bilateral upper extremity supported Standing balance-Leahy Scale: Poor                      Cognition Arousal/Alertness: Awake/alert Behavior During Therapy: Restless;Anxious Overall Cognitive Status: Impaired/Different from baseline Area of Impairment: Orientation;Attention;Memory;Following commands;Awareness;Problem solving Orientation Level: Disoriented to;Place;Time;Situation Current Attention Level: Focused Memory: Decreased short-term memory Following Commands: Follows one step commands inconsistently;Follows one step commands with increased time     Problem Solving: Decreased initiation;Difficulty sequencing;Requires verbal cues;Requires tactile cues;Slow processing General Comments: Patient mumbling - unable to understand.  Patient had taken off his O2 - reapplied prior to session.      Exercises      General Comments        Pertinent Vitals/Pain Pain Assessment: No/denies pain (PAINAD = 1)    Home Living                      Prior Function            PT  Goals (current goals can now be found in the care plan section) Progress towards PT goals: Progressing toward goals    Frequency  Min 3X/week    PT Plan Current plan remains appropriate    Co-evaluation             End of Session Equipment Utilized During Treatment: Gait belt;Oxygen Activity Tolerance: Patient limited by fatigue Patient left: in chair;with call bell/phone within reach;with family/visitor present     Time: 4098-1191 PT Time Calculation (min) (ACUTE ONLY): 23 min  Charges:   $Therapeutic Activity: 23-37 mins                    G Codes:      Vena Austria 03/18/2014, 3:56 PM Durenda Hurt. Renaldo Fiddler, [facility] Acute Rehab Services Pager [phone]

## 2014-02-24 NOTE — Progress Notes (Signed)
ANTIBIOTIC CONSULT NOTE - FOLLOW UP  Pharmacy Consult for Vancomycin Indication: MRSA right femoral graft infection  Patient Measurements: Height: 5\' 5"  (165.1 cm) Weight: 210 lb 8.6 oz (95.5 kg) IBW/kg (Calculated) : 61.5  Vital Signs: Temp: 97.4 F (36.3 C) (01/11 0550) Temp Source: Oral (01/11 0550) BP: 146/73 mmHg (01/11 0550) Pulse Rate: 71 (01/11 0550) Intake/Output from previous day: 01/10 0701 - 01/11 0700 In: 840 [P.O.:840] Out: 502.5 [Urine:300; Drains:202.5] Intake/Output from this shift: Total I/O In: 360 [P.O.:360] Out: -   Labs:  Recent Labs  02/23/14 0310 02/24/14 0605  WBC 10.7*  --   HGB 8.4*  --   PLT 421*  --   CREATININE 3.10* 3.88*   Estimated Creatinine Clearance: 20.4 mL/min (by C-G formula based on Cr of 3.88).  Recent Labs  02/24/14 0605  VANCORANDOM 39.9    Assessment:   Day # 14 Vancomycin; day # 4 Rifampin.  Planning 6 weeks of antibiotics for MRSA in groin wound, culture 02/15/14.   Vanc trough on 02/17/14 was 19.6 mcg/ml on 750 mg IV q12hrs, when Scr 1.37.  Scr trended up to 1.48 on 1/8, then up to 3.10 on 1/10. Standing Vancomycin regimen discontinued on 1/10 am; last dose given at ~6:30am on 1/10.   Random Vanc level this am (~ 24hrs after last dose) is supratherapeutic at 39.9 mcg/ml.  I/O positive.  Renal to see. Last Lisinopril dose 12/29 prior to admission.  Contrast 02/14/14. Renal consult pending.   Goal of Therapy:  Vancomycin trough level 15-20 mcg/ml  Plan:   Continue to hold Vancomycin.  Recheck random vanc level and bmet in am.  Resume Vanc when trough level is < 20 mcg/ml.  Follow up renal input.  Dennie FettersEgan, Nelia Rogoff Donovan, RPh Pager: [phone] 02/24/2014,11:51 AM

## 2014-02-24 NOTE — Progress Notes (Signed)
POD#3 gracilis muscle flap to right groin  Renal consult pending for increased Cr Vanc held- random Vanc level this am upper level of peak at 39.9 Per pt, was OOB yesterday  Temp:  [97.3 F (36.3 C)-99 F (37.2 C)] 97.4 F (36.3 C) (01/11 0550) Pulse Rate:  [70-71] 71 (01/11 0550) Resp:  [18] 18 (01/10 2109) BP: (146-177)/(68-87) 146/73 mmHg (01/11 0550) SpO2:  [85 %-98 %] 93 % (01/11 0824) Weight:  [95.5 kg (210 lb 8.6 oz)] 95.5 kg (210 lb 8.6 oz) (01/10 1516)   JP 120/82.5  PE Right thigh incisions dry, unchanged edema of thigh and groin No cellulitis , scant drainage on dressings  A/P Dry dressings to groin and thigh donor site daily- given edema and skin folds risk maceration Renal consult  DVT px- currently SCDs  Glenna FellowsBrinda Yovana Scogin, MD [facility] Plastic & Reconstructive Surgery [phone]

## 2014-02-24 NOTE — Progress Notes (Signed)
[facility]       Subjective: No new complaints   Antibiotics:  Anti-infectives    Start     Dose/Rate Route Frequency Ordered Stop   02/21/14 2200  rifampin (RIFADIN) capsule 300 mg     300 mg Oral Every 12 hours 02/21/14 2015     02/21/14 1049  polymyxin B 500,000 Units, bacitracin 50,000 Units in sodium chloride irrigation 0.9 % 500 mL irrigation  Status:  Discontinued       As needed 02/21/14 1050 02/21/14 1209   02/16/14 2000  ceFEPIme (MAXIPIME) 1 g in dextrose 5 % 50 mL IVPB  Status:  Discontinued     1 g100 mL/hr over 30 Minutes Intravenous Every 12 hours 02/16/14 0938 02/17/14 1357   02/14/14 0730  ceFEPIme (MAXIPIME) 1 g in dextrose 5 % 50 mL IVPB  Status:  Discontinued     1 g100 mL/hr over 30 Minutes Intravenous Every 24 hours 02/13/14 1035 02/16/14 0938   02/12/14 1000  oseltamivir (TAMIFLU) capsule 75 mg  Status:  Discontinued     75 mg Oral 2 times daily 02/12/14 0743 02/12/14 0747   02/12/14 1000  oseltamivir (TAMIFLU) capsule 30 mg  Status:  Discontinued     30 mg Oral 2 times daily 02/12/14 0749 02/16/14 0826   02/12/14 0600  vancomycin (VANCOCIN) IVPB 750 mg/150 ml premix  Status:  Discontinued     750 mg150 mL/hr over 60 Minutes Intravenous Every 12 hours 02/11/14 2047 02/23/14 1038   02/12/14 0000  ceFEPIme (MAXIPIME) 1 g in dextrose 5 % 50 mL IVPB  Status:  Discontinued     1 g100 mL/hr over 30 Minutes Intravenous Every 8 hours 02/11/14 2047 02/13/14 1035   02/11/14 1545  vancomycin (VANCOCIN) 1,500 mg in sodium chloride 0.9 % 500 mL IVPB     1,500 mg250 mL/hr over 120 Minutes Intravenous  Once 02/11/14 1537 02/11/14 1940   02/11/14 1545  ceFEPIme (MAXIPIME) 2 g in dextrose 5 % 50 mL IVPB     2 g100 mL/hr over 30 Minutes Intravenous  Once 02/11/14 1537 02/11/14 1642      Medications: Scheduled Meds: . antiseptic oral rinse  7 mL Mouth Rinse BID  . aspirin  81 mg Oral Daily  . atorvastatin  80 mg Oral q1800  . cloNIDine   0.1 mg Transdermal Weekly  . ferrous sulfate  325 mg Oral BID  . gabapentin  300 mg Oral BID  . hydrALAZINE  50 mg Oral 3 times per day  . insulin aspart  0-20 Units Subcutaneous TID WC  . insulin aspart  0-5 Units Subcutaneous QHS  . insulin detemir  25 Units Subcutaneous Q2200  . isosorbide mononitrate  30 mg Oral Daily  . metoprolol  25 mg Oral BID  . rifampin  300 mg Oral Q12H  . tamsulosin  0.4 mg Oral Daily  . triamcinolone cream  1 application Topical BID   Continuous Infusions:  PRN Meds:.acetaminophen, hydrALAZINE, ipratropium-albuterol, ondansetron **OR** ondansetron (ZOFRAN) IV, sodium chloride, traMADol    Objective: Weight change:   Intake/Output Summary (Last 24 hours) at 02/24/14 1448 Last data filed at 02/24/14 1156  Gross per 24 hour  Intake    480 ml  Output  892.5 ml  Net -412.5 ml   Blood pressure 146/73, pulse 71, temperature 97.4 F (36.3 C), temperature source Oral, resp. rate 18, height  (1.651 m), weight 210 lb 8.6 oz (95.5 kg), SpO2  93 %. Temp:  [97.3 F (36.3 C)-99 F (37.2 C)] 97.4 F (36.3 C) (01/11 0550) Pulse Rate:  [70-71] 71 (01/11 0550) Resp:  [18] 18 (01/10 2109) BP: (146-177)/(68-87) 146/73 mmHg (01/11 0550) SpO2:  [85 %-98 %] 93 % (01/11 0824) Weight:  [210 lb 8.6 oz (95.5 kg)] 210 lb 8.6 oz (95.5 kg) (01/10 1516)  Physical Exam: General: Alert and awake, oriented  HEENT: , EOMI CVS regular rate, normal r,  no murmur rubs or gallops Chest: clear to auscultation bilaterally, no wheezing, rales or rhonchi  Skin GROIN With dressing Neuro: nonfocal  CBC:  CBC Latest Ref Rng 02/23/2014 02/21/2014 02/20/2014  WBC 4.0 - 10.5 K/uL 10.7(H) 11.0(H) 11.2(H)  Hemoglobin 13.0 - 17.0 g/dL 2.1(H8.4(L) 0.8(M8.6(L) 5.7(Q8.7(L)  Hematocrit 39.0 - 52.0 % 27.5(L) 28.8(L) 29.0(L)  Platelets 150 - 400 K/uL 421(H) 410(H) 420(H)      BMET  Recent Labs  02/23/14 0310 02/24/14 0605  NA 131* 127*  K 5.2* 5.1  CL 98 94*  CO2 25 22  GLUCOSE 157* 152*    BUN 34* 43*  CREATININE 3.10* 3.88*  CALCIUM 8.4 8.2*     Liver Panel  No results for input(s): PROT, ALBUMIN, AST, ALT, ALKPHOS, BILITOT, BILIDIR, IBILI in the last 72 hours.     Sedimentation Rate No results for input(s): ESRSEDRATE in the last 72 hours. C-Reactive Protein No results for input(s): CRP in the last 72 hours.  Micro Results: Recent Results (from the past 720 hour(s))  Blood culture (routine x 2)     Status: None   Collection Time: 02/11/14  2:29 PM  Result Value Ref Range Status   Specimen Description BLOOD RIGHT HAND  Final   Special Requests BOTTLES DRAWN AEROBIC AND ANAEROBIC 4CC  Final   Culture NO GROWTH 5 DAYS  Final   Report Status 02/16/2014 FINAL  Final  Blood culture (routine x 2)     Status: None   Collection Time: 02/11/14  2:33 PM  Result Value Ref Range Status   Specimen Description BLOOD RIGHT ANTECUBITAL  Final   Special Requests BOTTLES DRAWN AEROBIC ONLY 6CC  Final   Culture NO GROWTH 5 DAYS  Final   Report Status 02/16/2014 FINAL  Final  Urine culture     Status: None   Collection Time: 02/11/14  2:56 PM  Result Value Ref Range Status   Specimen Description URINE, CLEAN CATCH  Final   Special Requests NONE  Final   Colony Count NO GROWTH [facility]formed at [facility]   Final   Culture NO GROWTH Performed at [facility]   Final   Report Status 02/12/2014 FINAL  Final  MRSA PCR Screening     Status: None   Collection Time: 02/11/14  6:40 PM  Result Value Ref Range Status   MRSA by PCR NEGATIVE NEGATIVE Final    Comment:        The GeneXpert MRSA Assay (FDA approved for NASAL specimens only), is one component of a comprehensive MRSA colonization surveillance program. It is not intended to diagnose MRSA infection nor to guide or monitor treatment for MRSA infections.   Wound culture     Status: None   Collection Time: 02/14/14 10:08 AM  Result Value Ref Range Status   Specimen Description WOUND GROIN RIGHT   Final   Special Requests NONE  Final   Gram Stain   Final    NO WBC SEEN NO SQUAMOUS EPITHELIAL CELLS SEEN NO ORGANISMS SEEN Performed  at [facility]    Culture   Final    NO GROWTH 3 DAYS Performed at [facility]    Report Status 02/17/2014 FINAL  Final  Body fluid culture     Status: None   Collection Time: 02/15/14  9:49 AM  Result Value Ref Range Status   Specimen Description FLUID RIGHT DRAINAGE GROIN  Final   Special Requests NONE  Final   Gram Stain   Final    ABUNDANT WBC PRESENT, PREDOMINANTLY PMN RARE GRAM POSITIVE COCCI IN PAIRS Performed at [facility]    Culture   Final    MODERATE METHICILLIN RESISTANT STAPHYLOCOCCUS AUREUS Note: RIFAMPIN AND GENTAMICIN SHOULD NOT BE USED AS SINGLE DRUGS FOR TREATMENT OF STAPH INFECTIONS. This organism DOES NOT demonstrate inducible Clindamycin resistance in vitro. CRITICAL RESULT CALLED TO, READ BACK BY AND VERIFIED WITH: CAROL@8 :58AM ON  02/17/13 BY DANTS Performed at [facility]    Report Status 02/17/2014 FINAL  Final   Organism ID, Bacteria METHICILLIN RESISTANT STAPHYLOCOCCUS AUREUS  Final      Susceptibility   Methicillin resistant staphylococcus aureus - MIC*    CLINDAMYCIN <=0.25 SENSITIVE Sensitive     ERYTHROMYCIN >=8 RESISTANT Resistant     GENTAMICIN <=0.5 SENSITIVE Sensitive     LEVOFLOXACIN >=8 RESISTANT Resistant     OXACILLIN >=4 RESISTANT Resistant     PENICILLIN >=0.5 RESISTANT Resistant     RIFAMPIN <=0.5 SENSITIVE Sensitive     TRIMETH/SULFA >=320 RESISTANT Resistant     VANCOMYCIN 1 SENSITIVE Sensitive     TETRACYCLINE <=1 SENSITIVE Sensitive     * MODERATE METHICILLIN RESISTANT STAPHYLOCOCCUS AUREUS  MRSA PCR Screening     Status: None   Collection Time: 02/21/14  4:11 AM  Result Value Ref Range Status   MRSA by PCR NEGATIVE NEGATIVE Final    Comment:        The GeneXpert MRSA Assay (FDA approved for NASAL specimens only), is one component of a comprehensive  MRSA colonization surveillance program. It is not intended to diagnose MRSA infection nor to guide or monitor treatment for MRSA infections.     Studies/Results: Dg Chest Port 1 View  02/24/2014   CLINICAL DATA:  Lethargy and disorientation; history of coronary artery disease and peripheral vascular disease, hypertension, and diabetes  EXAM: PORTABLE CHEST - 1 VIEW  COMPARISON:  Portable chest x-ray dated February 12, 2014  FINDINGS: The lungs are mildly hypoinflated. The interstitial markings are increased bilaterally. The cardiac silhouette is enlarged. The pulmonary vascularity is engorged. The retrocardiac region is dense. The hemidiaphragms are mildly blunted. The permanent pacemaker is in appropriate position radiographically. There are post CABG changes.  IMPRESSION: Congestive heart failure with moderate interstitial edema and small bilateral pleural effusions.   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 male with aortobifemoral grafting in 2007. Last November he underwent right femoral artery cannulation for aortogram followed bythrombectomy of the left femoral limb. Postoperatively he had some scrotal swelling and some difficulty healing his right femoral cannulation site with some thin drainage. He developed sudden onset of fever  associated with some pain and swelling in his right groin leading to admission on 02/12/2014. His initial fever was 104.4. He was started on empiric antibiotics. Admission blood cultures were negative. An ultrasound of his right groin showed a fluid collection and he underwent incision and drainage on 02/15/2014 revealing an abscess sitting on top of the right femoral graft. Abscess cultures grew MRSA.He underwent a gracilis muscle flap procedure to close his right groin wound. 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 Currently the patient has supratherapeutic vancomycin levels and will have supra to therapeutic levels likely for several days. Given his acute renal worsening I would stop his vancomycin. We can follow the levels as they come down and hopefully over the ensuing days and when he falls below the therapeutic range I would recommend changing him over to IV daptomycin. We'll continue rifampin orally in the meantime.  I will plan on treating him for at least 6 weeks with IV therapy likely daptomycin plus oral rifampin.  At that point in time we'll indeed change over to oral therapy likely DOXY and RIFAMPIN (note he is TMPSMX Resistant  #2 He is HIV negative this admission, will check hep panel as well   LOS: 13 days   Acey Lav 02/24/2014, 2:48 PM

## 2014-02-25 ENCOUNTER — Inpatient Hospital Stay (HOSPITAL_COMMUNITY): Payer: Medicare HMO

## 2014-02-25 DIAGNOSIS — R188 Other ascites: Secondary | ICD-10-CM | POA: Insufficient documentation

## 2014-02-25 DIAGNOSIS — R410 Disorientation, unspecified: Secondary | ICD-10-CM

## 2014-02-25 LAB — BASIC METABOLIC PANEL
Anion gap: 6 (ref 5–15)
BUN: 46 mg/dL — AB (ref 6–23)
CHLORIDE: 96 meq/L (ref 96–112)
CO2: 24 mmol/L (ref 19–32)
Calcium: 7.8 mg/dL — ABNORMAL LOW (ref 8.4–10.5)
Creatinine, Ser: 4.05 mg/dL — ABNORMAL HIGH (ref 0.50–1.35)
GFR calc Af Amer: 17 mL/min — ABNORMAL LOW (ref >90)
GFR calc non Af Amer: 14 mL/min — ABNORMAL LOW (ref >90)
Glucose, Bld: 124 mg/dL — ABNORMAL HIGH (ref 70–99)
POTASSIUM: 5.1 mmol/L (ref 3.5–5.1)
Sodium: 126 mmol/L — ABNORMAL LOW (ref 135–145)

## 2014-02-25 LAB — CBC
HEMATOCRIT: 26.3 % — AB (ref 39.0–52.0)
Hemoglobin: 8.2 g/dL — ABNORMAL LOW (ref 13.0–17.0)
MCH: 25.7 pg — ABNORMAL LOW (ref 26.0–34.0)
MCHC: 31.2 g/dL (ref 30.0–36.0)
MCV: 82.4 fL (ref 78.0–100.0)
Platelets: 417 10*3/uL — ABNORMAL HIGH (ref 150–400)
RBC: 3.19 MIL/uL — AB (ref 4.22–5.81)
RDW: 14.3 % (ref 11.5–15.5)
WBC: 13.5 10*3/uL — ABNORMAL HIGH (ref 4.0–10.5)

## 2014-02-25 LAB — BLOOD GAS, ARTERIAL
Acid-base deficit: 1.6 mmol/L (ref 0.0–2.0)
Bicarbonate: 23.7 mEq/L (ref 20.0–24.0)
DRAWN BY: 246861
O2 Content: 2 L/min
O2 Saturation: 96.9 %
PATIENT TEMPERATURE: 98.6
PCO2 ART: 48.2 mmHg — AB (ref 35.0–45.0)
TCO2: 25.2 mmol/L (ref 0–100)
pH, Arterial: 7.313 — ABNORMAL LOW (ref 7.350–7.450)
pO2, Arterial: 105 mmHg — ABNORMAL HIGH (ref 80.0–100.0)

## 2014-02-25 LAB — URINALYSIS, ROUTINE W REFLEX MICROSCOPIC
GLUCOSE, UA: NEGATIVE mg/dL
KETONES UR: 15 mg/dL — AB
Nitrite: NEGATIVE
PROTEIN: 100 mg/dL — AB
Specific Gravity, Urine: 1.019 (ref 1.005–1.030)
Urobilinogen, UA: 1 mg/dL (ref 0.0–1.0)
pH: 5 (ref 5.0–8.0)

## 2014-02-25 LAB — URINE MICROSCOPIC-ADD ON

## 2014-02-25 LAB — GLUCOSE, CAPILLARY
GLUCOSE-CAPILLARY: 124 mg/dL — AB (ref 70–99)
GLUCOSE-CAPILLARY: 124 mg/dL — AB (ref 70–99)
Glucose-Capillary: 107 mg/dL — ABNORMAL HIGH (ref 70–99)
Glucose-Capillary: 135 mg/dL — ABNORMAL HIGH (ref 70–99)

## 2014-02-25 LAB — AMMONIA: AMMONIA: 23 umol/L (ref 11–32)

## 2014-02-25 LAB — CK: CK TOTAL: 158 U/L (ref 7–232)

## 2014-02-25 LAB — VANCOMYCIN, RANDOM: Vancomycin Rm: 29.9 ug/mL

## 2014-02-25 MED ORDER — HALOPERIDOL LACTATE 5 MG/ML IJ SOLN
1.0000 mg | Freq: Once | INTRAMUSCULAR | Status: AC
  Administered 2014-02-25: 1 mg via INTRAMUSCULAR
  Filled 2014-02-25: qty 1

## 2014-02-25 MED ORDER — CALCIUM CARBONATE ANTACID 500 MG PO CHEW
1.0000 | CHEWABLE_TABLET | Freq: Two times a day (BID) | ORAL | Status: DC | PRN
  Filled 2014-02-25: qty 1

## 2014-02-25 MED ORDER — DEXTROSE-NACL 5-0.45 % IV SOLN
INTRAVENOUS | Status: DC
  Administered 2014-02-25 (×2): via INTRAVENOUS

## 2014-02-25 MED ORDER — PANTOPRAZOLE SODIUM 40 MG PO TBEC
40.0000 mg | DELAYED_RELEASE_TABLET | Freq: Every day | ORAL | Status: DC
  Administered 2014-02-25 – 2014-02-26 (×2): 40 mg via ORAL
  Filled 2014-02-25: qty 1

## 2014-02-25 MED ORDER — HEPARIN SODIUM (PORCINE) 5000 UNIT/ML IJ SOLN
5000.0000 [IU] | Freq: Three times a day (TID) | INTRAMUSCULAR | Status: DC
  Administered 2014-02-25 – 2014-03-04 (×21): 5000 [IU] via SUBCUTANEOUS
  Filled 2014-02-25 (×24): qty 1

## 2014-02-25 NOTE — Clinical Social Work Placement (Signed)
Clinical Social Work Department CLINICAL SOCIAL WORK PLACEMENT NOTE 02/25/2014  Patient:  Bonney LeitzWITHERS,Arliss F  Account Number:  [id number] Admit date:  02/11/2014  Clinical Social Worker:  Cherre BlancJOSEPH BRYANT Bridgit Eynon, [place]  Date/time:  02/25/2014 04:37 PM  Clinical Social Work is seeking post-discharge placement for this patient at the following level of care:   SKILLED NURSING   (*CSW will update this form in Epic as items are completed)   02/25/2014  Patient/family provided with [facility] of Clinical Social Work's list of facilities offering this level of care within the geographic area requested by the patient (or if unable, by the patient's family).  02/25/2014  Patient/family informed of their freedom to choose among providers that offer the needed level of care, that participate in Medicare, Medicaid or managed care program needed by the patient, have an available bed and are willing to accept the patient.  02/25/2014  Patient/family informed of [facility]' ownership interest in [facility], as well as of the fact that they are under no obligation to receive care at this facility.  PASARR submitted to EDS on 02/25/2014 PASARR number received on 02/25/2014  FL2 transmitted to all facilities in geographic area requested by pt/family on  02/25/2014 FL2 transmitted to all facilities within larger geographic area on   Patient informed that his/her managed care company has contracts with or will negotiate with  certain facilities, including the following:     Patient/family informed of bed offers received:  02/25/2014 [facility] [place] Physician recommends and patient chooses bed at    [facility]   Patient to be transferred to facility by  Patient and family notified of transfer on  Name of family member notified:    The following physician request were entered in Epic:   Additional  Comments:  CSW has requested [facility] start authorization for [employer].   Roddie McBryant Altheia Shafran MSW, [place], [place], [phone]

## 2014-02-25 NOTE — Progress Notes (Signed)
   VASCULAR SURGERY ASSESSMENT & PLAN:  * Plans for long term antibiotics noted.  * Not much to add from a vascular standpoint. I will continue to follow the graft long term.   SUBJECTIVE: Confused.  PHYSICAL EXAM: Filed Vitals:   02/24/14 0824 02/24/14 1512 02/24/14 2200 02/25/14 0618  BP:  177/86 162/95 122/87  Pulse:   70   Temp:  98.5 F (36.9 C) 98.5 F (36.9 C) 98.5 F (36.9 C)  TempSrc:  Oral Oral Oral  Resp:  21 22 24   Height:      Weight:      SpO2: 93% 97% 98% 100%   Right groin dressing dry. Drains in place.   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

## 2014-02-25 NOTE — Clinical Social Work Placement (Signed)
Clinical Social Work Department BRIEF PSYCHOSOCIAL ASSESSMENT 02/25/2014  Patient:  [name], [name]     Account Number:  [id number]     Admit date:  02/11/2014  Clinical Social Worker:  Lovey Newcomer  Date/Time:  02/25/2014 04:28 PM  Referred by:  Physician  Date Referred:  02/25/2014 Referred for  SNF Placement   Other Referral:   NA   Interview type:  Patient Other interview type:   Patient and wife interviewed at bedside to complete assessment.    PSYCHOSOCIAL DATA Living Status:  WIFE Admitted from facility:   Level of care:   Primary support name:  Delores Primary support relationship to patient:  SPOUSE Degree of support available:   Support is strong.    CURRENT CONCERNS Current Concerns  Post-Acute Placement   Other Concerns:   NA    SOCIAL WORK ASSESSMENT / PLAN CSW met with patient and patient's wife at bedside to complete assessment. Patient states that he is agreeable to SNF placement and wife states they would prefer placement in [facility]. CSW explained SNF search/placement process and answered questions. Patient and wife prefer [facility]. Patient lives with wife at home and the plan is to get him back home once able. Patient appears somewhat confused throughout assessment, but wife was calm and engaged in assessment.   Assessment/plan status:  Psychosocial Support/Ongoing Assessment of Needs Other assessment/ plan:   Complete Fl2, Fax, PASRR   Information/referral to community resources:   CSW contact information and SNF list given.    PATIENT'S/FAMILY'S RESPONSE TO PLAN OF CARE: Patient and patient's daughter state that they would like patient to DC to SNF once medically stable. CSW will assist.       Liz Beach MSW, [place], [place], [phone]

## 2014-02-25 NOTE — Progress Notes (Signed)
RN paged because pt was slightly agitated and breathing harder than normal. No desaturations. No tachypnea.  Asked RN to have RT apply nightly bipap and see if he settles down. NP to bedside. Pt says his breathing is "so so", but no worse. He says he hasn't slept and needs to rest. RT applying bipap.  He is in no distress. No tachypnea or increased WOB. He isn't agitated per se, but is slightly restless. His Neurontin was d/c'd today secondary to this and confusion.  O2 sat normal.  Will give him Haldol 1mg  IM now and see if he can rest. Last QTc < 500.  Will follow. Jimmye NormanKaren Kirby-Graham, NP [facility]

## 2014-02-25 NOTE — Progress Notes (Signed)
POD#4 gracilis muscle flap to right groin  Continued increased Cr Vanc held- random Vanc level this am still upper level of peak  CT head ordered for increased somnolence  Temp:  [98.5 F (36.9 C)] 98.5 F (36.9 C) (01/12 0618) Pulse Rate:  [70] 70 (01/11 2200) Resp:  [21-24] 24 (01/12 0618) BP: (122-177)/(86-95) 122/87 mmHg (01/12 0618) SpO2:  [97 %-100 %] 100 % (01/12 0618)   JP 135/275  PE Right thigh dressings changed: some serous drainage noted on dressings ppoximal thigh and distal groin incisions, unable to express any fluid from incisions Drains serous improved edema of thigh and groin No cellulitis   A/P Dry dressings to groin and thigh donor site daily- given edema and skin folds risk maceration  DVT px- SCDs not on patient, heparin started by Dr. Catha BrowVann   Alonie Gazzola, MD [facility] Plastic & Reconstructive Surgery [phone]

## 2014-02-25 NOTE — Progress Notes (Signed)
[facility]       Subjective: A she is more confused today and wearing protective gloves   Antibiotics:  Anti-infectives    Start     Dose/Rate Route Frequency Ordered Stop   02/21/14 2200  rifampin (RIFADIN) capsule 300 mg     300 mg Oral Every 12 hours 02/21/14 2015     02/21/14 1049  polymyxin B 500,000 Units, bacitracin 50,000 Units in sodium chloride irrigation 0.9 % 500 mL irrigation  Status:  Discontinued       As needed 02/21/14 1050 02/21/14 1209   02/16/14 2000  ceFEPIme (MAXIPIME) 1 g in dextrose 5 % 50 mL IVPB  Status:  Discontinued     1 g100 mL/hr over 30 Minutes Intravenous Every 12 hours 02/16/14 0938 02/17/14 1357   02/14/14 0730  ceFEPIme (MAXIPIME) 1 g in dextrose 5 % 50 mL IVPB  Status:  Discontinued     1 g100 mL/hr over 30 Minutes Intravenous Every 24 hours 02/13/14 1035 02/16/14 0938   02/12/14 1000  oseltamivir (TAMIFLU) capsule 75 mg  Status:  Discontinued     75 mg Oral 2 times daily 02/12/14 0743 02/12/14 0747   02/12/14 1000  oseltamivir (TAMIFLU) capsule 30 mg  Status:  Discontinued     30 mg Oral 2 times daily 02/12/14 0749 02/16/14 0826   02/12/14 0600  vancomycin (VANCOCIN) IVPB 750 mg/150 ml premix  Status:  Discontinued     750 mg150 mL/hr over 60 Minutes Intravenous Every 12 hours 02/11/14 2047 02/23/14 1038   02/12/14 0000  ceFEPIme (MAXIPIME) 1 g in dextrose 5 % 50 mL IVPB  Status:  Discontinued     1 g100 mL/hr over 30 Minutes Intravenous Every 8 hours 02/11/14 2047 02/13/14 1035   02/11/14 1545  vancomycin (VANCOCIN) 1,500 mg in sodium chloride 0.9 % 500 mL IVPB     1,500 mg250 mL/hr over 120 Minutes Intravenous  Once 02/11/14 1537 02/11/14 1940   02/11/14 1545  ceFEPIme (MAXIPIME) 2 g in dextrose 5 % 50 mL IVPB     2 g100 mL/hr over 30 Minutes Intravenous  Once 02/11/14 1537 02/11/14 1642      Medications: Scheduled Meds: . antiseptic oral rinse  7 mL Mouth Rinse BID  . aspirin  81 mg Oral Daily  .  atorvastatin  80 mg Oral q1800  . cloNIDine  0.1 mg Transdermal Weekly  . ferrous sulfate  325 mg Oral BID  . heparin subcutaneous  5,000 Units Subcutaneous 3 times per day  . hydrALAZINE  50 mg Oral 3 times per day  . insulin aspart  0-20 Units Subcutaneous TID WC  . insulin aspart  0-5 Units Subcutaneous QHS  . insulin detemir  25 Units Subcutaneous Q2200  . isosorbide mononitrate  30 mg Oral Daily  . metoprolol  25 mg Oral BID  . pantoprazole  40 mg Oral Daily  . rifampin  300 mg Oral Q12H  . tamsulosin  0.4 mg Oral Daily  . triamcinolone cream  1 application Topical BID   Continuous Infusions: . dextrose 5 % and 0.45% NaCl 75 mL/hr at 02/25/14 1126   PRN Meds:.acetaminophen, calcium carbonate, hydrALAZINE, ipratropium-albuterol, ondansetron **OR** ondansetron (ZOFRAN) IV, sodium chloride    Objective: Weight change:   Intake/Output Summary (Last 24 hours) at 02/25/14 1756 Last data filed at 02/25/14 1500  Gross per 24 hour  Intake    130 ml  Output    650 ml  Net   -520 ml   Blood pressure 130/86, pulse 70, temperature 99.3 F (37.4 C), temperature source Oral, resp. rate 20, height  (1.651 m), weight 210 lb 8.6 oz (95.5 kg), SpO2 99 %. Temp:  [98.5 F (36.9 C)-99.3 F (37.4 C)] 99.3 F (37.4 C) (01/12 1257) Pulse Rate:  [70] 70 (01/12 1257) Resp:  [20-24] 20 (01/12 1257) BP: (122-169)/(86-142) 130/86 mmHg (01/12 1529) SpO2:  [98 %-100 %] 99 % (01/12 1257)  Physical Exam: General: Alert and awake, oriented to person confused HEENT: , EOMI CVS regular rate, normal r,  no murmur rubs or gallops Chest: clear to auscultation bilaterally, no wheezing, rales or rhonchi  Skin GROIN With dressing Neuro: nonfocal  CBC:  CBC Latest Ref Rng 02/25/2014 02/23/2014 02/21/2014  WBC 4.0 - 10.5 K/uL 13.5(H) 10.7(H) 11.0(H)  Hemoglobin 13.0 - 17.0 g/dL 8.2(L) 8.4(L) 8.6(L)  Hematocrit 39.0 - 52.0 % 26.3(L) 27.5(L) 28.8(L)  Platelets 150 - 400 K/uL 417(H) 421(H) 410(H)       BMET  Recent Labs  02/24/14 0605 02/25/14 0615  NA 127* 126*  K 5.1 5.1  CL 94* 96  CO2 22 24  GLUCOSE 152* 124*  BUN 43* 46*  CREATININE 3.88* 4.05*  CALCIUM 8.2* 7.8*     Liver Panel  No results for input(s): PROT, ALBUMIN, AST, ALT, ALKPHOS, BILITOT, BILIDIR, IBILI in the last 72 hours.     Sedimentation Rate No results for input(s): ESRSEDRATE in the last 72 hours. C-Reactive Protein No results for input(s): CRP in the last 72 hours.  Micro Results: Recent Results (from the past 720 hour(s))  Blood culture (routine x 2)     Status: None   Collection Time: 02/11/14  2:29 PM  Result Value Ref Range Status   Specimen Description BLOOD RIGHT HAND  Final   Special Requests BOTTLES DRAWN AEROBIC AND ANAEROBIC 4CC  Final   Culture NO GROWTH 5 DAYS  Final   Report Status 02/16/2014 FINAL  Final  Blood culture (routine x 2)     Status: None   Collection Time: 02/11/14  2:33 PM  Result Value Ref Range Status   Specimen Description BLOOD RIGHT ANTECUBITAL  Final   Special Requests BOTTLES DRAWN AEROBIC ONLY 6CC  Final   Culture NO GROWTH 5 DAYS  Final   Report Status 02/16/2014 FINAL  Final  Urine culture     Status: None   Collection Time: 02/11/14  2:56 PM  Result Value Ref Range Status   Specimen Description URINE, CLEAN CATCH  Final   Special Requests NONE  Final   Colony Count NO GROWTH Performed at [facility]   Final   Culture NO GROWTH Performed at [facility]   Final   Report Status 02/12/2014 FINAL  Final  MRSA PCR Screening     Status: None   Collection Time: 02/11/14  6:40 PM  Result Value Ref Range Status   MRSA by PCR NEGATIVE NEGATIVE Final    Comment:        The GeneXpert MRSA Assay (FDA approved for NASAL specimens only), is one component of a comprehensive MRSA colonization surveillance program. It is not intended to diagnose MRSA infection nor to guide or monitor treatment for MRSA infections.   Wound  culture     Status: None   Collection Time: 02/14/14 10:08 AM  Result Value Ref Range Status   Specimen Description WOUND GROIN RIGHT  Final   Special Requests NONE  Final  Gram Stain   Final    NO WBC SEEN NO SQUAMOUS EPITHELIAL CELLS SEEN NO ORGANISMS SEEN [facility]    Culture   Final    NO GROWTH 3 DAYS [facility]formed at [facility]    Report Status 02/17/2014 FINAL  Final  Body fluid culture     Status: None   Collection Time: 02/15/14  9:49 AM  Result Value Ref Range Status   Specimen Description FLUID RIGHT DRAINAGE GROIN  Final   Special Requests NONE  Final   Gram Stain   Final    ABUNDANT WBC PRESENT, PREDOMINANTLY PMN RARE GRAM POSITIVE COCCI IN PAIRS [facility]formed at [facility]    Culture   Final    MODERATE METHICILLIN RESISTANT STAPHYLOCOCCUS AUREUS Note: RIFAMPIN AND GENTAMICIN SHOULD NOT BE USED AS SINGLE DRUGS FOR TREATMENT OF STAPH INFECTIONS. This organism DOES NOT demonstrate inducible Clindamycin resistance in vitro. CRITICAL RESULT CALLED TO, READ BACK BY AND VERIFIED WITH: CAROL@8 :58AM ON  02/17/13 BY DANTS Performed at [facility]    Report Status 02/17/2014 FINAL  Final   Organism ID, Bacteria METHICILLIN RESISTANT STAPHYLOCOCCUS AUREUS  Final      Susceptibility   Methicillin resistant staphylococcus aureus - MIC*    CLINDAMYCIN <=0.25 SENSITIVE Sensitive     ERYTHROMYCIN >=8 RESISTANT Resistant     GENTAMICIN <=0.5 SENSITIVE Sensitive     LEVOFLOXACIN >=8 RESISTANT Resistant     OXACILLIN >=4 RESISTANT Resistant     PENICILLIN >=0.5 RESISTANT Resistant     RIFAMPIN <=0.5 SENSITIVE Sensitive     TRIMETH/SULFA >=320 RESISTANT Resistant     VANCOMYCIN 1 SENSITIVE Sensitive     TETRACYCLINE <=1 SENSITIVE Sensitive     * MODERATE METHICILLIN RESISTANT STAPHYLOCOCCUS AUREUS  MRSA PCR Screening     Status: None   Collection Time: 02/21/14  4:11 AM  Result Value Ref Range Status   MRSA by PCR NEGATIVE  NEGATIVE Final    Comment:        The GeneXpert MRSA Assay (FDA approved for NASAL specimens only), is one component of a comprehensive MRSA colonization surveillance program. It is not intended to diagnose MRSA infection nor to guide or monitor treatment for MRSA infections.     Studies/Results: Ct Head Wo Contrast  02/25/2014   CLINICAL DATA:  64 year old with confusion  EXAM: CT HEAD WITHOUT CONTRAST  TECHNIQUE: Contiguous axial images were obtained from the base of the skull through the vertex without intravenous contrast.  COMPARISON:  12/31/2013  FINDINGS: The gray-white differentiation is preserved.  There is no evidence of an acute infarct.  There is no acute intracranial hemorrhage.  There is no midline shift or mass effect.  There is no extra-axial fluid collection.  Soft tissue opacification is noted in the partially visualized right ethmoid sinuses. The remaining paranasal sinuses and mastoid air cells are aerated.  The orbits are intact.  IMPRESSION: 1. No acute intracranial process. 2. Partially visualized right ethmoid sinus disease.   Electronically Signed   By: Fannie KneeKenneth  Crosby   On: 02/25/2014 11:28   Dg Chest Port 1 View  02/24/2014   CLINICAL DATA:  Lethargy and disorientation; history of coronary artery disease and peripheral vascular disease, hypertension, and diabetes  EXAM: PORTABLE CHEST - 1 VIEW  COMPARISON:  Portable chest x-ray dated February 12, 2014  FINDINGS: The lungs are mildly hypoinflated. The interstitial markings are increased bilaterally. The cardiac silhouette is enlarged. The pulmonary vascularity is engorged. The retrocardiac  region is dense. The hemidiaphragms are mildly blunted. The permanent pacemaker is in appropriate position radiographically. There are post CABG changes.  IMPRESSION: Congestive heart failure with moderate interstitial edema and small bilateral pleural effusions.   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 male with aortobifemoral grafting in 2007. Last November he underwent right femoral artery cannulation for aortogram followed bythrombectomy of the left femoral limb. Postoperatively he had some scrotal swelling and some difficulty healing his right femoral cannulation site with some thin drainage. He developed sudden onset of fever associated with some pain and swelling in his right groin leading to admission on 02/12/2014. His initial fever was 104.4. He was started on empiric antibiotics. Admission blood cultures were negative. An ultrasound of his right groin showed a fluid collection and he underwent incision and drainage on 02/15/2014 revealing an abscess sitting on top of the right femoral graft. Abscess cultures grew MRSA.He underwent a gracilis muscle flap procedure to close his right groin wound. 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 Currently the patient has supratherapeutic vancomycin levels and will have supra to therapeutic levels likely for several days. Given his acute renal worsening I stopped his vancomycin. We can follow the levels as  they come down and hopefully over the ensuing days and when he falls below the therapeutic range I would recommend changing him over to IV daptomycin. We'll continue rifampin orally in the meantime.  I will plan on treating him for at least 6 weeks with IV therapy likely daptomycin plus oral rifampin.  At that point in time we'll indeed change over to oral therapy likely DOXY and RIFAMPIN (note he is TMPSMX Resistant     LOS: 14 days   Acey Lav 02/25/2014, 5:56 PM

## 2014-02-25 NOTE — Progress Notes (Signed)
Previous RN did not hook up JP drain to suction. Talked to Engineer, petroleumplastic surgeon. Told to cover incisions with xeroform and a dry dressing. If JP drain continues to inflate, leave until the morning. Will continue to monitor.

## 2014-02-25 NOTE — Progress Notes (Signed)
Tried to flatten JP drain 1 to continue suction, but JP drain instantly inflated with air.  No hole or leakage could be found.  Called Plastic Surgery team and 6N for advice.  Told to set drain to low continuous suction.  Will continue to monitor.

## 2014-02-25 NOTE — Progress Notes (Signed)
[facility] PROGRESS NOTE Interim history: 64 year old male with past medical history a recent aorta femoral bypass, who came in with acute hypoxic respiratory failure, sepsis and elevated cardiac enzymes. Started on empiric antibiotics. CT imaging of the Chest was done in ED showed no PE. He also developed acute kidney injury probably due to sepsis, ACE inhibitor and contrast-induced nephropathy which resolved. Groin ultrasound showed abscess, vascular surgery consulted recommended a CT scan of the abdomen and pelvis with contrast emergently on 02/14/2014 which showed multiple loculated abscesses and possible infected native graft, he is  status post I&D and possible graft infection on 02/15/2014. Blood cultures negative but abscess cultures are growing MRSA, continued on vancomycin and cefepime was discontinued.  He will need an ischemic evaluation.  He was transfused 1 unit of PRBC.  Given clinical improvement, he was transferred out of stepdown to telemetry on 02/17/2014.  Renal function continued to worsen after surgery.     Filed Weights   02/11/14 2059 02/12/14 0535 02/23/14 1516  Weight: 90 kg (198 lb 6.6 oz) 88.2 kg (194 lb 7.1 oz) 95.5 kg (210 lb 8.6 oz)        Intake/Output Summary (Last 24 hours) at 02/25/14 0901 Last data filed at 02/25/14 [id number]  Gross per 24 hour  Intake    610 ml  Output   1160 ml  Net   -550 ml     Assessment/Plan: Sepsis/Due to R groin abscess with infected graft: - Cultures growing MRSA, continue on vancomycin.  - Ct angio of graft showed multiple abscess and possible infected native graft. Vascular and Plastic Surgery, onboard.  S/p surgery 1/8 - Blood cultures negative and final. - S/p I&D of groin abscess, cultures from 02/15/2013 MRSA. - ID to help with abx- 6 weeks of abx- doxy (due to renal failure) and rifampin  Acute respiratory failure with hypoxia - Likely due to sepsis of the groin and demand ischemia.  monito  AKI (acute kidney  injury)/acute toxic nephropathy on CKD (baseline 1.8ish) - Multifactorial due to sepsis, ACE inhibitor and contrast-induced nephropathy. ACE held. -renal consult- gentle IVF - placed foley, no obstruction +6L since admission if I/Os are correct- ?if lasix needed- defer to nephrology D/c Neurontin  Sleepiness/confusion -ABG does not show significant issues -sun downed last PM- did not sleep well D/c neurontin bipap at night- suspect sleep apnea -will get CT head -ammonia  Elevated Troponin's: - Cont aspirin, and beta blockers. Cardiology consulted -Abnormal stress test followed by cardiac cath 1/7 showed patent all bypass grafts and cardiology states low risk of major ischemic events related to surgery and no further cardiac testing at this point.  Hyponatremia -monitor  Chronic obstructive pulmonary disease - Stable.  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  Essential hypertension - Mildly uncontrolled and fluctuating.  -hydralazine added 1/9  Normocytic anemia: - S/p 2 units of packed red blood cells. - will transfuse for < 8 due to cardiac hx - Stable.   Code Status: Full code  DVT Prophylaxis: SCDs. Family Communication: patient/wife Disposition Plan: LTAC- family does not want SNF- wants to do IV abx at home   Consultants:  Cardiology  Vascular Surgery  Plastic Surgery  ID  renal  Procedures: ECHO: EF normal no wall motion. Pelvic US 12.30.2015: Large 11.4 x 10.0 x 4.6 cm right groin complex fluid collection noted  LHC 02/20/14  Antibiotics:  Vancomycin 02/11/2014   Cefepime 02/11/2014 ->  02/17/2013.  HPI/Subjective: Episodes of confusion last PM More oriented this AM- asking for alka-seltzer  Objective: Filed Vitals:   02/24/14 0824 02/24/14 1512 02/24/14 2200 02/25/14 0618  BP:  177/86 162/95 122/87  Pulse:   70   Temp:  98.5 F (36.9 C) 98.5 F (36.9 C)  98.5 F (36.9 C)  TempSrc:  Oral Oral Oral  Resp:  Height:      Weight:      SpO2: 93% 97% 98% 100%     Exam:  Physical Exam: General: Awake, speech difficult to understand (baseline per family) CV: S1 and S2, RRR. No JVD or murmurs.  Lungs: Clear to ascultation bilaterally Abdomen: Soft, Nontender, Nondistended, +bowel sounds. Ext: Good pulses. Trace edema. Skin: dressing of right groin clean and dry.   Data Reviewed: Basic Metabolic Panel:  Recent Labs Lab 02/20/14 0500 02/21/14 0350 02/23/14 0310 02/24/14 0605 02/25/14 0615  NA 138 135 131* 127* 126*  K 4.1 4.2 5.2* 5.1 5.1  CL 101 100 98 94* 96  CO2 33* GLUCOSE 160* 221* 157* 152* 124*  BUN 13 17 34* 43* 46*  CREATININE 1.25 1.48* 3.10* 3.88* 4.05*  CALCIUM 8.4 8.3* 8.4 8.2* 7.8*   Liver Function Tests: No results for input(s): AST, ALT, ALKPHOS, BILITOT, PROT, ALBUMIN in the last 168 hours. No results for input(s): LIPASE, AMYLASE in the last 168 hours. No results for input(s): AMMONIA in the last 168 hours. CBC:  Recent Labs Lab 02/19/14 0508 02/20/14 0500 02/21/14 0350 02/23/14 0310 02/25/14 0615  WBC 10.5 11.2* 11.0* 10.7* 13.5*  HGB 9.0* 8.7* 8.6* 8.4* 8.2*  HCT 29.7* 29.0* 28.8* 27.5* 26.3*  MCV 84.6 84.8 84.2 83.3 82.4  PLT 386 420* 410* 421* 417*   Cardiac Enzymes:  Recent Labs Lab 02/25/14 0615  CKTOTAL 158   BNP (last 3 results) No results for input(s): PROBNP in the last 8760 hours. CBG:  Recent Labs Lab 02/24/14 0807 02/24/14 1250 02/24/14 1741 02/24/14 2158 02/25/14 0831  GLUCAP 186* 174* 167* 251* 107*    Recent Results (from the past 240 hour(s))  Body fluid culture     Status: None   Collection Time: 02/15/14  9:49 AM  Result Value Ref Range Status   Specimen Description FLUID RIGHT DRAINAGE GROIN  Final   Special Requests NONE  Final   Gram Stain   Final    ABUNDANT WBC PRESENT, PREDOMINANTLY PMN RARE GRAM POSITIVE COCCI IN  PAIRS Performed at [facility]    Culture   Final    MODERATE METHICILLIN RESISTANT STAPHYLOCOCCUS AUREUS Note: RIFAMPIN AND GENTAMICIN SHOULD NOT BE USED AS SINGLE DRUGS FOR TREATMENT OF STAPH INFECTIONS. This organism DOES NOT demonstrate inducible Clindamycin resistance in vitro. CRITICAL RESULT CALLED TO, READ BACK BY AND VERIFIED WITH: CAROL@8 :58AM ON  02/17/13 BY DANTS Performed at [facility]    Report Status 02/17/2014 FINAL  Final   Organism ID, Bacteria METHICILLIN RESISTANT STAPHYLOCOCCUS AUREUS  Final      Susceptibility   Methicillin resistant staphylococcus aureus - MIC*    CLINDAMYCIN <=0.25 SENSITIVE Sensitive     ERYTHROMYCIN >=8 RESISTANT Resistant     GENTAMICIN <=0.5 SENSITIVE Sensitive     LEVOFLOXACIN >=8 RESISTANT Resistant     OXACILLIN >=4 RESISTANT Resistant     PENICILLIN >=0.5 RESISTANT Resistant     RIFAMPIN <=0.5 SENSITIVE Sensitive     TRIMETH/SULFA >=320 RESISTANT Resistant     VANCOMYCIN 1  SENSITIVE Sensitive     TETRACYCLINE <=1 SENSITIVE Sensitive     * MODERATE METHICILLIN RESISTANT STAPHYLOCOCCUS AUREUS  MRSA PCR Screening     Status: None   Collection Time: 02/21/14  4:11 AM  Result Value Ref Range Status   MRSA by PCR NEGATIVE NEGATIVE Final    Comment:        The GeneXpert MRSA Assay (FDA approved for NASAL specimens only), is one component of a comprehensive MRSA colonization surveillance program. It is not intended to diagnose MRSA infection nor to guide or monitor treatment for MRSA infections.      Studies: Dg Chest Port 1 View  02/24/2014   CLINICAL DATA:  Lethargy and disorientation; history of coronary artery disease and peripheral vascular disease, hypertension, and diabetes  EXAM: PORTABLE CHEST - 1 VIEW  COMPARISON:  Portable chest x-ray dated February 12, 2014  FINDINGS: The lungs are mildly hypoinflated. The interstitial markings are increased bilaterally. The cardiac silhouette is enlarged. The pulmonary  vascularity is engorged. The retrocardiac region is dense. The hemidiaphragms are mildly blunted. The permanent pacemaker is in appropriate position radiographically. There are post CABG changes.  IMPRESSION: Congestive heart failure with moderate interstitial edema and small bilateral pleural effusions.   Electronically Signed   By: David  [place]   On: 02/24/2014 08:43    Scheduled Meds: . antiseptic oral rinse  7 mL Mouth Rinse BID  . aspirin  81 mg Oral Daily  . atorvastatin  80 mg Oral q1800  . cloNIDine  0.1 mg Transdermal Weekly  . ferrous sulfate  325 mg Oral BID  . hydrALAZINE  50 mg Oral 3 times per day  . insulin aspart  0-20 Units Subcutaneous TID WC  . insulin aspart  0-5 Units Subcutaneous QHS  . insulin detemir  25 Units Subcutaneous Q2200  . isosorbide mononitrate  30 mg Oral Daily  . metoprolol  25 mg Oral BID  . pantoprazole  40 mg Oral Daily  . rifampin  300 mg Oral Q12H  . tamsulosin  0.4 mg Oral Daily  . triamcinolone cream  1 application Topical BID   Continuous Infusions: . dextrose 5 % and 0.45% NaCl      Time: 25 minutes.  Chalmers Cater [facility] Pager [phone] If 7PM-7AM, please contact night-coverage [email or web address] Password [email or web address] 02/25/2014, 9:01 AM      LOS: 14 days

## 2014-02-25 NOTE — Progress Notes (Signed)
Subjective: Difficult to understand his speech. Feeling better today   Objective: Vital signs in last 24 hours: Temp:  [98.5 F (36.9 C)] 98.5 F (36.9 C) (01/12 0618) Pulse Rate:  [70] 70 (01/11 2200) Resp:  [21-24] 24 (01/12 0618) BP: (122-177)/(86-95) 122/87 mmHg (01/12 0618) SpO2:  [97 %-100 %] 100 % (01/12 0618) Weight change:  Last BM Date: 02/21/14 Intake/Output from previous day: -495 01/11 0701 - 01/12 0700 In: 610 [P.O.:600; I.V.:10] Out: 1035 [Urine:625; Drains:410] Intake/Output this shift: Total I/O In: -  Out: 125 [Drains:125]  PE:  General:Pleasant affect, NAD Skin:Warm and dry, brisk capillary refill HEENT:normocephalic, sclera clear, mucus membranes moist Heart:S1S2 RRR without murmur, gallup, rub or click Lungs:clear without rales, rhonchi, or wheezes ZOX:WRUEAbd:soft, non tender, + BS, do not palpate liver sple en or masses Ext:no lower ext edema,Rt groin dressing in place,   Lab Results:  Recent Labs  02/23/14 0310 02/25/14 0615  WBC 10.7* 13.5*  HGB 8.4* 8.2*  HCT 27.5* 26.3*  PLT 421* 417*   BMET  Recent Labs  02/24/14 0605 02/25/14 0615  NA 127* 126*  K 5.1 5.1  CL 94* 96  CO2 22 24  GLUCOSE 152* 124*  BUN 43* 46*  CREATININE 3.88* 4.05*  CALCIUM 8.2* 7.8*     Lab Results  Component Value Date   CHOL 125 12/03/2013   HDL 23* 12/03/2013   LDLCALC 39 12/03/2013   LDLDIRECT 77 09/17/2012   TRIG 317* 12/03/2013   CHOLHDL 5.4 12/03/2013   Lab Results  Component Value Date   HGBA1C 11.1* 12/31/2013     Lab Results  Component Value Date   TSH 1.341 02/11/2014     Studies/Results: Dg Chest Port 1 View  02/24/2014   CLINICAL DATA:  Lethargy and disorientation; history of coronary artery disease and peripheral vascular disease, hypertension, and diabetes  EXAM: PORTABLE CHEST - 1 VIEW  COMPARISON:  Portable chest x-ray dated February 12, 2014  FINDINGS: The lungs are mildly hypoinflated. The interstitial markings  are increased bilaterally. The cardiac silhouette is enlarged. The pulmonary vascularity is engorged. The retrocardiac region is dense. The hemidiaphragms are mildly blunted. The permanent pacemaker is in appropriate position radiographically. There are post CABG changes.  IMPRESSION: Congestive heart failure with moderate interstitial edema and small bilateral pleural effusions.   Electronically Signed   By: David  [place]   On: 02/24/2014 08:43    Medications: I have reviewed the patient's current medications. Scheduled Meds: . antiseptic oral rinse  7 mL Mouth Rinse BID  . aspirin  81 mg Oral Daily  . atorvastatin  80 mg Oral q1800  . cloNIDine  0.1 mg Transdermal Weekly  . ferrous sulfate  325 mg Oral BID  . heparin subcutaneous  5,000 Units Subcutaneous 3 times per day  . hydrALAZINE  50 mg Oral 3 times per day  . insulin aspart  0-20 Units Subcutaneous TID WC  . insulin aspart  0-5 Units Subcutaneous QHS  . insulin detemir  25 Units Subcutaneous Q2200  . isosorbide mononitrate  30 mg Oral Daily  . metoprolol  25 mg Oral BID  . pantoprazole  40 mg Oral Daily  . rifampin  300 mg Oral Q12H  . tamsulosin  0.4 mg Oral Daily  . triamcinolone cream  1 application Topical BID   Continuous Infusions: . dextrose 5 % and 0.45% NaCl     PRN Meds:.acetaminophen, calcium carbonate, hydrALAZINE, ipratropium-albuterol, ondansetron **OR** ondansetron (ZOFRAN)  IV, sodium chloride    Nuc study: IMPRESSION: 1. Medium-sized area of mild reversibility involving the anterior and anterior lateral walls. Suspicious for inducible ischemia. 2. Global hypokinesis with lateral wall focal hypokinesis to dyskinesis. 3. Left ventricular ejection fraction 44% 4. Intermediate-risk stress test findings.  LHC 02/20/13 Final Conclusions:  1. 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 Status post four-vessel CABG with continued patency of all grafts  (LIMA to LAD, saphenous vein graft to PDA, saphenous vein graft sequential to OM1 and OM 2)  Recommendations: The patient has patency of all bypass grafts. He should be at low risk of major ischemic events related to surgery. No further cardiac testing indicated at this point. Will continue with medical therapy as tolerated.   Assessment/Plan: [name] is a 64 y.o. male with a history of COPD, CAD s/p CABG (2003), DM 2, PVD, OSA , SSS s/p pacer who was transferred from [facility] to [facility] on 02/12/14 with acute respiratory failure. He had a recent aorta femoral bypass and was found to have an abscess in his groin s/p I&D of the abscess by Dr. Darrick Penna on 02/15/13. Cardiology was consulted for elevated troponin.  EF 60-65% on echo, mild aortic stenosis. PA pk pressure was 59 mmHg.  Now with intermediate-risk for ischemia with troponin of 8. He underwent nuclear stress test which returned abnormal and then LHC which revealed 3/4 patent bypass grafts and stable dz. He was cleared for surgery on of infected graft on 02/21/13. Now having issues with worsening renal function.   CAD s/p CABG (2003): Stable ischemic heart disease with continued patency of all grafts (LIMA to LAD, saphenous vein graft to PDA, saphenous vein graft sequential to OM1 and OM 2) noted by cath on 1/7. Echo on 12/30 demonstrated normal LV systolic function, moderate LVH, and diastolic dysfunction.  -- NSTEMI due to demand ischemia during crtitical illness.  -- Continue ASA, Lipitor, Imdur, and metoprolol  Acute kidney injury- Multifactorial due to sepsis, ACE inhibitor and contrast-induced nephropathy. ACE held. -- Per renal. Creat now 4   Sepsis/Due groin abscess with infected graft: -- ABx. POD #4 s/p gracilis muscle flap to right groin.   HTN- well controlled currently -- Continue lopressor  BID, imdur . Clonidine patch 0.1mg , and hydralazine  TID.  Abnormal CT abdomen- 3. 13 mm hyperdense cortical lesion of the mid  lateral left renal cortex. Followup is recommended. -- Per IM  SSS s/p PPM- No further PMT since PVARP adjustment  Normocytic anemia: -- S/p 2 units of packed red blood cells. Hg stable at 8.4   Dispo- worsening renal function. Nephrology on board. No further cardiac studies planned. We can likely sign off. MD to see today  Time spent with pt. :15 minutes. Cline Crock R  PA-C 02/25/2014, 11:03 AM

## 2014-02-25 NOTE — Progress Notes (Signed)
ANTIBIOTIC CONSULT NOTE - FOLLOW UP  Pharmacy Consult for Vancomycin -> Daptomycin; SQ heparin  Indication: MRSA right femoral graft infection; VTE prophylaxis  Patient Measurements: Height: 5\' 5"  (165.1 cm) Weight: 210 lb 8.6 oz (95.5 kg) IBW/kg (Calculated) : 61.5  Vital Signs: Temp: 99.3 F (37.4 C) (01/12 1257) Temp Source: Oral (01/12 1257) BP: 169/142 mmHg (01/12 1257) Pulse Rate: 70 (01/12 1257) Intake/Output from previous day: 01/11 0701 - 01/12 0700 In: 610 [P.O.:600; I.V.:10] Out: 1035 [Urine:625; Drains:410] Intake/Output from this shift: Total I/O In: -  Out: 125 [Drains:125]  Labs:  Recent Labs  02/23/14 0310 02/24/14 0605 02/25/14 0615  WBC 10.7*  --  13.5*  HGB 8.4*  --  8.2*  PLT 421*  --  417*  CREATININE 3.10* 3.88* 4.05*   Estimated Creatinine Clearance: 19.6 mL/min (by C-G formula based on Cr of 4.05).  Recent Labs  02/24/14 0605 02/25/14 0615  VANCORANDOM 39.9 29.9    Assessment:   Day # 15 Vancomycin; day # 5 Rifampin.  Planning 6 weeks of IV antibiotics for MRSA in groin wound culture 02/15/14; then PO doxycycline + Rifampin.  To change to Daptomycin when Vanc level < 15 mcg/ml.  No further Vancomycin planned.     Vanc trough on 02/17/14 was 19.6 mcg/ml on 750 mg IV q12hrs, when Scr 1.37.  Scr trended up to 1.48 on 1/8, then up to 3.10 on 1/10, and now up to 4.05.  Standing Vancomycin regimen discontinued on 1/10 am; last dose given at ~6:30am on 1/10.     Random Vanc level this am (~ 48 hrs after last dose) down some to 29.9 mcg/ml, but remains supratherapeutic. I/O now negative. Last Lisinopril dose 12/29 prior to admission.  Contrast 02/14/14.   SCDs changed to sq heparin for VTE prophylaxis.   Goal of Therapy:  Vancomycin trough level 15-20 mcg/ml  Plan:   Vancomycin discontinued.    To begin Daptomycin when Vanc level < 15 mcg/ml.  Recheck random vanc level and bmet in am.  Heparin 5000 units sq q8hrs begun today.   Follow up for any  bleeding; intermittent CBC.  Dennie FettersEgan, Marcellius Montagna Donovan, RPh Pager: [phone] 02/25/2014,1:56 PM

## 2014-02-25 NOTE — Progress Notes (Signed)
Called by primary RN to assess Rt thigh JP drain that would not hold suction.  On assessment the drain appears to have been pulled out about 4in from the insertion site.  Though it is still stitched in, the drain tubing is looped around and resting on the pt's lateral thigh.  After discussing this with the surgeon the order was received to pull the drain.  Drain was d/c'd without difficulty.  Occlusive guaze dressing placed over the site.

## 2014-02-25 NOTE — Progress Notes (Signed)
[facility] ROUNDING NOTE   Subjective:   Interval History: antibiotics switched from vancomycin to daptomycin  Objective:  Vital signs in last 24 hours:  Temp:  [98.5 F (36.9 C)-99.3 F (37.4 C)] 99.3 F (37.4 C) (01/12 1257) Pulse Rate:  [70] 70 (01/12 1257) Resp:  [20-24] 20 (01/12 1257) BP: (122-177)/(86-142) 169/142 mmHg (01/12 1257) SpO2:  [97 %-100 %] 99 % (01/12 1257)  Weight change:  Filed Weights   02/11/14 2059 02/12/14 0535 02/23/14 1516  Weight: 90 kg (198 lb 6.6 oz) 88.2 kg (194 lb 7.1 oz) 95.5 kg (210 lb 8.6 oz)    Intake/Output: I/O last 3 completed shifts: In: 610 [P.O.:600; I.V.:10] Out: 1067.5 [Urine:625; Drains:442.5]   Intake/Output this shift:  Total I/O In: -  Out: 125 [Drains:125] Not orientated to time CVS- RRR RS- CTA ABD- BS present soft non-distended EXT- no edema   Basic Metabolic Panel:  Recent Labs Lab 02/20/14 0500 02/21/14 0350 02/23/14 0310 02/24/14 0605 02/25/14 0615  NA 138 135 131* 127* 126*  K 4.1 4.2 5.2* 5.1 5.1  CL 101 100 98 94* 96  CO2 33* GLUCOSE 160* 221* 157* 152* 124*  BUN 13 17 34* 43* 46*  CREATININE 1.25 1.48* 3.10* 3.88* 4.05*  CALCIUM 8.4 8.3* 8.4 8.2* 7.8*    Liver Function Tests: No results for input(s): AST, ALT, ALKPHOS, BILITOT, PROT, ALBUMIN in the last 168 hours. No results for input(s): LIPASE, AMYLASE in the last 168 hours.  Recent Labs Lab 02/25/14 1200  AMMONIA 23    CBC:  Recent Labs Lab 02/19/14 0508 02/20/14 0500 02/21/14 0350 02/23/14 0310 02/25/14 0615  WBC 10.5 11.2* 11.0* 10.7* 13.5*  HGB 9.0* 8.7* 8.6* 8.4* 8.2*  HCT 29.7* 29.0* 28.8* 27.5* 26.3*  MCV 84.6 84.8 84.2 83.3 82.4  PLT 386 420* 410* 421* 417*    Cardiac Enzymes:  Recent Labs Lab 02/25/14 0615  CKTOTAL 158    BNP: Invalid input(s): POCBNP  CBG:  Recent Labs Lab 02/24/14 1250 02/24/14 1741 02/24/14 2158 02/25/14 0831 02/25/14 1350  GLUCAP 174* 167* 251*  107* 124*    Microbiology: Results for orders placed or performed during the hospital encounter of 02/11/14  Blood culture (routine x 2)     Status: None   Collection Time: 02/11/14  2:29 PM  Result Value Ref Range Status   Specimen Description BLOOD RIGHT HAND  Final   Special Requests BOTTLES DRAWN AEROBIC AND ANAEROBIC 4CC  Final   Culture NO GROWTH 5 DAYS  Final   Report Status 02/16/2014 FINAL  Final  Blood culture (routine x 2)     Status: None   Collection Time: 02/11/14  2:33 PM  Result Value Ref Range Status   Specimen Description BLOOD RIGHT ANTECUBITAL  Final   Special Requests BOTTLES DRAWN AEROBIC ONLY 6CC  Final   Culture NO GROWTH 5 DAYS  Final   Report Status 02/16/2014 FINAL  Final  Urine culture     Status: None   Collection Time: 02/11/14  2:56 PM  Result Value Ref Range Status   Specimen Description URINE, CLEAN CATCH  Final   Special Requests NONE  Final   Colony Count NO GROWTH Performed at [facility]   Final   Culture NO GROWTH Performed at [facility]   Final   Report Status 02/12/2014 FINAL  Final  MRSA PCR Screening     Status: None   Collection Time: 02/11/14  6:40  PM  Result Value Ref Range Status   MRSA by PCR NEGATIVE NEGATIVE Final    Comment:        The GeneXpert MRSA Assay (FDA approved for NASAL specimens only), is one component of a comprehensive MRSA colonization surveillance program. It is not intended to diagnose MRSA infection nor to guide or monitor treatment for MRSA infections.   Wound culture     Status: None   Collection Time: 02/14/14 10:08 AM  Result Value Ref Range Status   Specimen Description WOUND GROIN RIGHT  Final   Special Requests NONE  Final   Gram Stain   Final    NO WBC SEEN NO SQUAMOUS EPITHELIAL CELLS SEEN NO ORGANISMS SEEN Performed at [facility]    Culture   Final    NO GROWTH 3 DAYS Performed at [facility]    Report Status 02/17/2014 FINAL  Final  Body  fluid culture     Status: None   Collection Time: 02/15/14  9:49 AM  Result Value Ref Range Status   Specimen Description FLUID RIGHT DRAINAGE GROIN  Final   Special Requests NONE  Final   Gram Stain   Final    ABUNDANT WBC PRESENT, PREDOMINANTLY PMN RARE GRAM POSITIVE COCCI IN PAIRS Performed at [facility]    Culture   Final    MODERATE METHICILLIN RESISTANT STAPHYLOCOCCUS AUREUS Note: RIFAMPIN AND GENTAMICIN SHOULD NOT BE USED AS SINGLE DRUGS FOR TREATMENT OF STAPH INFECTIONS. This organism DOES NOT demonstrate inducible Clindamycin resistance in vitro. CRITICAL RESULT CALLED TO, READ BACK BY AND VERIFIED WITH: CAROL@8 :58AM ON  02/17/13 BY DANTS Performed at [facility]    Report Status 02/17/2014 FINAL  Final   Organism ID, Bacteria METHICILLIN RESISTANT STAPHYLOCOCCUS AUREUS  Final      Susceptibility   Methicillin resistant staphylococcus aureus - MIC*    CLINDAMYCIN <=0.25 SENSITIVE Sensitive     ERYTHROMYCIN >=8 RESISTANT Resistant     GENTAMICIN <=0.5 SENSITIVE Sensitive     LEVOFLOXACIN >=8 RESISTANT Resistant     OXACILLIN >=4 RESISTANT Resistant     PENICILLIN >=0.5 RESISTANT Resistant     RIFAMPIN <=0.5 SENSITIVE Sensitive     TRIMETH/SULFA >=320 RESISTANT Resistant     VANCOMYCIN 1 SENSITIVE Sensitive     TETRACYCLINE <=1 SENSITIVE Sensitive     * MODERATE METHICILLIN RESISTANT STAPHYLOCOCCUS AUREUS  MRSA PCR Screening     Status: None   Collection Time: 02/21/14  4:11 AM  Result Value Ref Range Status   MRSA by PCR NEGATIVE NEGATIVE Final    Comment:        The GeneXpert MRSA Assay (FDA approved for NASAL specimens only), is one component of a comprehensive MRSA colonization surveillance program. It is not intended to diagnose MRSA infection nor to guide or monitor treatment for MRSA infections.     Coagulation Studies: No results for input(s): LABPROT, INR in the last 72 hours.  Urinalysis:  Recent Labs  02/25/14 1356  COLORURINE  AMBER*  LABSPEC 1.019  PHURINE 5.0  GLUCOSEU NEGATIVE  HGBUR LARGE*  BILIRUBINUR MODERATE*  KETONESUR 15*  PROTEINUR 100*  UROBILINOGEN 1.0  NITRITE NEGATIVE  LEUKOCYTESUR SMALL*      Imaging: Ct Head Wo Contrast  02/25/2014   CLINICAL DATA:  64 year old with confusion  EXAM: CT HEAD WITHOUT CONTRAST  TECHNIQUE: Contiguous axial images were obtained from the base of the skull through the vertex without intravenous contrast.  COMPARISON:  12/31/2013  FINDINGS: The  gray-white differentiation is preserved.  There is no evidence of an acute infarct.  There is no acute intracranial hemorrhage.  There is no midline shift or mass effect.  There is no extra-axial fluid collection.  Soft tissue opacification is noted in the partially visualized right ethmoid sinuses. The remaining paranasal sinuses and mastoid air cells are aerated.  The orbits are intact.  IMPRESSION: 1. No acute intracranial process. 2. Partially visualized right ethmoid sinus disease.   Electronically Signed   By: Fannie KneeKenneth  Crosby   On: 02/25/2014 11:28   Dg Chest Port 1 View  02/24/2014   CLINICAL DATA:  Lethargy and disorientation; history of coronary artery disease and peripheral vascular disease, hypertension, and diabetes  EXAM: PORTABLE CHEST - 1 VIEW  COMPARISON:  Portable chest x-ray dated February 12, 2014  FINDINGS: The lungs are mildly hypoinflated. The interstitial markings are increased bilaterally. The cardiac silhouette is enlarged. The pulmonary vascularity is engorged. The retrocardiac region is dense. The hemidiaphragms are mildly blunted. The permanent pacemaker is in appropriate position radiographically. There are post CABG changes.  IMPRESSION: Congestive heart failure with moderate interstitial edema and small bilateral pleural effusions.   Electronically Signed   By: David  [place]   On: 02/24/2014 08:43     Medications:   . dextrose 5 % and 0.45% NaCl 75 mL/hr at 02/25/14 1126   . antiseptic oral rinse  7  mL Mouth Rinse BID  . aspirin  81 mg Oral Daily  . atorvastatin  80 mg Oral q1800  . cloNIDine  0.1 mg Transdermal Weekly  . ferrous sulfate  325 mg Oral BID  . heparin subcutaneous  5,000 Units Subcutaneous 3 times per day  . hydrALAZINE  50 mg Oral 3 times per day  . insulin aspart  0-20 Units Subcutaneous TID WC  . insulin aspart  0-5 Units Subcutaneous QHS  . insulin detemir  25 Units Subcutaneous Q2200  . isosorbide mononitrate  30 mg Oral Daily  . metoprolol  25 mg Oral BID  . pantoprazole  40 mg Oral Daily  . rifampin  300 mg Oral Q12H  . tamsulosin  0.4 mg Oral Daily  . triamcinolone cream  1 application Topical BID   acetaminophen, calcium carbonate, hydrALAZINE, ipratropium-albuterol, ondansetron **OR** ondansetron (ZOFRAN) IV, sodium chloride  Assessment/ Plan:  1.Acute kidney Injury There are multiple reasons that need to be considered. The vancomycin could cause acute interstitial nephritis or acute tubular necrosis and I think that this is quite plausible. The use of IV contrast and catheterization raise the spector of contrast nephropathy or an atheroembolic shower . A blocked foley catheter could also be possible and this should be relatively easy to diagnose and rectify 2. Hyponatremia Probably secondary to excess water intake with worsening renal function 3. Hyperkalemia will follow no contributing drugs identified 4. Anemia will follow would not want to use iron with ongoing infection 5.Hypocalcemia  Will check renal panel with albumin in am   LOS: 14 Joie Reamer W @TODAY @3 :00 PM

## 2014-02-26 ENCOUNTER — Inpatient Hospital Stay (HOSPITAL_COMMUNITY): Payer: Medicare HMO

## 2014-02-26 DIAGNOSIS — L24A9 Irritant contact dermatitis due friction or contact with other specified body fluids: Secondary | ICD-10-CM | POA: Insufficient documentation

## 2014-02-26 DIAGNOSIS — R4182 Altered mental status, unspecified: Secondary | ICD-10-CM

## 2014-02-26 DIAGNOSIS — T148XXA Other injury of unspecified body region, initial encounter: Secondary | ICD-10-CM | POA: Insufficient documentation

## 2014-02-26 DIAGNOSIS — Z9989 Dependence on other enabling machines and devices: Secondary | ICD-10-CM

## 2014-02-26 DIAGNOSIS — R5383 Other fatigue: Secondary | ICD-10-CM | POA: Insufficient documentation

## 2014-02-26 DIAGNOSIS — Z951 Presence of aortocoronary bypass graft: Secondary | ICD-10-CM

## 2014-02-26 LAB — URINALYSIS, ROUTINE W REFLEX MICROSCOPIC
Glucose, UA: NEGATIVE mg/dL
KETONES UR: 15 mg/dL — AB
NITRITE: NEGATIVE
PROTEIN: 100 mg/dL — AB
Specific Gravity, Urine: 1.015 (ref 1.005–1.030)
Urobilinogen, UA: 1 mg/dL (ref 0.0–1.0)
pH: 5 (ref 5.0–8.0)

## 2014-02-26 LAB — BASIC METABOLIC PANEL
ANION GAP: 7 (ref 5–15)
ANION GAP: 9 (ref 5–15)
BUN: 51 mg/dL — ABNORMAL HIGH (ref 6–23)
BUN: 52 mg/dL — ABNORMAL HIGH (ref 6–23)
CALCIUM: 8.2 mg/dL — AB (ref 8.4–10.5)
CO2: 21 mmol/L (ref 19–32)
CO2: 26 mmol/L (ref 19–32)
CREATININE: 4.26 mg/dL — AB (ref 0.50–1.35)
Calcium: 8.1 mg/dL — ABNORMAL LOW (ref 8.4–10.5)
Chloride: 96 mEq/L (ref 96–112)
Chloride: 95 mEq/L — ABNORMAL LOW (ref 96–112)
Creatinine, Ser: 4.26 mg/dL — ABNORMAL HIGH (ref 0.50–1.35)
GFR calc Af Amer: 16 mL/min — ABNORMAL LOW (ref >90)
GFR calc non Af Amer: 13 mL/min — ABNORMAL LOW (ref >90)
GFR calc non Af Amer: 13 mL/min — ABNORMAL LOW (ref >90)
GFR, EST AFRICAN AMERICAN: 16 mL/min — AB (ref >90)
Glucose, Bld: 160 mg/dL — ABNORMAL HIGH (ref 70–99)
Glucose, Bld: 136 mg/dL — ABNORMAL HIGH (ref 70–99)
Potassium: 5.3 mmol/L — ABNORMAL HIGH (ref 3.5–5.1)
Potassium: 5.1 mmol/L (ref 3.5–5.1)
SODIUM: 129 mmol/L — AB (ref 135–145)
Sodium: 125 mmol/L — ABNORMAL LOW (ref 135–145)

## 2014-02-26 LAB — CBC
HCT: 25.8 % — ABNORMAL LOW (ref 39.0–52.0)
HEMOGLOBIN: 8.1 g/dL — AB (ref 13.0–17.0)
MCH: 26.3 pg (ref 26.0–34.0)
MCHC: 31.4 g/dL (ref 30.0–36.0)
MCV: 83.8 fL (ref 78.0–100.0)
Platelets: 396 10*3/uL (ref 150–400)
RBC: 3.08 MIL/uL — AB (ref 4.22–5.81)
RDW: 14.5 % (ref 11.5–15.5)
WBC: 11.7 10*3/uL — AB (ref 4.0–10.5)

## 2014-02-26 LAB — BLOOD GAS, ARTERIAL
Acid-base deficit: 3.6 mmol/L — ABNORMAL HIGH (ref 0.0–2.0)
BICARBONATE: 22.1 meq/L (ref 20.0–24.0)
Drawn by: 27022
O2 Content: 2 L/min
O2 SAT: 91 %
PH ART: 7.284 — AB (ref 7.350–7.450)
PO2 ART: 72.8 mmHg — AB (ref 80.0–100.0)
Patient temperature: 98.6
TCO2: 23.6 mmol/L (ref 0–100)
pCO2 arterial: 48 mmHg — ABNORMAL HIGH (ref 35.0–45.0)

## 2014-02-26 LAB — URINE MICROSCOPIC-ADD ON

## 2014-02-26 LAB — POCT I-STAT 3, ART BLOOD GAS (G3+)
Acid-base deficit: 5 mmol/L — ABNORMAL HIGH (ref 0.0–2.0)
BICARBONATE: 22.7 meq/L (ref 20.0–24.0)
O2 Saturation: 90 %
PCO2 ART: 52.8 mmHg — AB (ref 35.0–45.0)
TCO2: 24 mmol/L (ref 0–100)
pH, Arterial: 7.241 — ABNORMAL LOW (ref 7.350–7.450)
pO2, Arterial: 71 mmHg — ABNORMAL LOW (ref 80.0–100.0)

## 2014-02-26 LAB — GLUCOSE, CAPILLARY
GLUCOSE-CAPILLARY: 100 mg/dL — AB (ref 70–99)
Glucose-Capillary: 156 mg/dL — ABNORMAL HIGH (ref 70–99)
Glucose-Capillary: 130 mg/dL — ABNORMAL HIGH (ref 70–99)
Glucose-Capillary: 109 mg/dL — ABNORMAL HIGH (ref 70–99)
Glucose-Capillary: 96 mg/dL (ref 70–99)

## 2014-02-26 LAB — OSMOLALITY: OSMOLALITY: 284 mosm/kg (ref 275–300)

## 2014-02-26 LAB — VANCOMYCIN, RANDOM: VANCOMYCIN RM: 24.2 ug/mL

## 2014-02-26 LAB — CREATININE, URINE, RANDOM: Creatinine, Urine: 168.83 mg/dL

## 2014-02-26 LAB — SODIUM, URINE, RANDOM: Sodium, Ur: 17 mmol/L

## 2014-02-26 LAB — OSMOLALITY, URINE: Osmolality, Ur: 251 mOsm/kg — ABNORMAL LOW (ref 390–1090)

## 2014-02-26 MED ORDER — FUROSEMIDE 10 MG/ML IJ SOLN
60.0000 mg | Freq: Once | INTRAMUSCULAR | Status: AC
  Administered 2014-02-26: 60 mg via INTRAVENOUS
  Filled 2014-02-26: qty 6

## 2014-02-26 MED ORDER — SODIUM CHLORIDE 0.9 % IJ SOLN
10.0000 mL | INTRAMUSCULAR | Status: DC | PRN
  Administered 2014-03-01 – 2014-03-04 (×2): 10 mL
  Filled 2014-02-26 (×2): qty 40

## 2014-02-26 MED ORDER — IPRATROPIUM-ALBUTEROL 0.5-2.5 (3) MG/3ML IN SOLN
3.0000 mL | Freq: Four times a day (QID) | RESPIRATORY_TRACT | Status: DC
  Administered 2014-02-26 – 2014-02-28 (×7): 3 mL via RESPIRATORY_TRACT
  Filled 2014-02-26 (×8): qty 3

## 2014-02-26 MED ORDER — FUROSEMIDE 10 MG/ML IJ SOLN
160.0000 mg | Freq: Once | INTRAVENOUS | Status: AC
  Administered 2014-02-26: 160 mg via INTRAVENOUS
  Filled 2014-02-26: qty 16

## 2014-02-26 MED ORDER — METOPROLOL TARTRATE 1 MG/ML IV SOLN
5.0000 mg | Freq: Four times a day (QID) | INTRAVENOUS | Status: DC
  Administered 2014-02-26 – 2014-02-27 (×3): 5 mg via INTRAVENOUS
  Filled 2014-02-26 (×6): qty 5

## 2014-02-26 MED ORDER — ALBUTEROL SULFATE (2.5 MG/3ML) 0.083% IN NEBU
2.5000 mg | INHALATION_SOLUTION | RESPIRATORY_TRACT | Status: DC | PRN
  Administered 2014-02-27 – 2014-03-01 (×2): 2.5 mg via RESPIRATORY_TRACT
  Filled 2014-02-26 (×2): qty 3

## 2014-02-26 MED ORDER — SODIUM CHLORIDE 0.9 % IV SOLN
300.0000 mg | Freq: Two times a day (BID) | INTRAVENOUS | Status: DC
  Administered 2014-02-26 – 2014-02-27 (×2): 300 mg via INTRAVENOUS
  Filled 2014-02-26 (×3): qty 300

## 2014-02-26 MED ORDER — PANTOPRAZOLE SODIUM 40 MG IV SOLR
40.0000 mg | INTRAVENOUS | Status: DC
  Administered 2014-02-26: 40 mg via INTRAVENOUS
  Filled 2014-02-26 (×2): qty 40

## 2014-02-26 NOTE — Consult Note (Signed)
PULMONARY / CRITICAL CARE MEDICINE   Name: KEMONTAE DUNKLEE MRN: [id number] DOB: [birth year]    ADMISSION DATE:  02/11/2014 CONSULTATION DATE:  02/26/14  REFERRING MD :  Benjamine Mola ([facility])   CHIEF COMPLAINT:  Resp failure, AMS   INITIAL PRESENTATION: 64yo male with complex PMH including HTN,  DM, OSA, COPD, CAD s/p CABG 2003, PVD s/p previous aortobifemoral bypass.  Initially admitted 12/29 with fevers and SOB with elevated troponin.  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  Treated with Vanc and rifampin with plans for 6 weeks abx.  Course has been c/b hyponatremia, AKI with worsening renal function and possible volume overload.  On 1/13 he was laid flat for PICC placement and became increasingly SOB and PCCM consulted.   STUDIES:  CT head 1/12>>> neg acute  2D echo 12/29>>> EF 60-65%, moderate LVH, PASP , moderate to severely dilated LA, trivial pericardial effusion without tamponade physiology   SIGNIFICANT EVENTS: 1/2>>> incision/ drainage R groin abscess  1/8>>> OR>> Gracilis muscle flap to R groin for closure    HISTORY OF PRESENT ILLNESS:  64yo male with complex PMH including HTN,  DM, OSA, COPD, CAD s/p CABG 2003, PVD s/p previous aortobifemoral bypass.  Initially admitted 12/29 with fevers and SOB with elevated troponin.  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  Treated with Vanc and rifampin with plans for 6 weeks abx.  Course has been c/b AKI with worsening renal function and possible volume overload.  On 1/13 he was laid flat for PICC placement and became increasingly SOB and PCCM consulted.  Denies chest pain, hemoptysis, pain.  Does have productive cough, difficult to expectorate fully.  Intermittent confusion/AMS.   PAST MEDICAL HISTORY :   has a past medical history of Arteriosclerotic cardiovascular disease (ASCVD); Diabetes mellitus type II;  Hyperlipidemia; Degenerative joint disease; Cerebrovascular disease (2003); Peripheral vascular disease (2007); Anemia; Peripheral neuropathy; Hypertension; Sick sinus syndrome (1996); Chronic obstructive pulmonary disease; Obesity; Obstructive sleep apnea; Benign prostatic hypertrophy; Gastroesophageal reflux disease; and Tobacco abuse, in remission.  has past surgical history that includes Coronary artery bypass graft (2003); Iliac artery stent; Pacemaker insertion (1996); Carotid endarterectomy (2003); Pacemaker generator change (2010); Aorta - bilateral femoral artery bypass graft (2007); Femoral-popliteal Bypass Graft (1998); Colonoscopy (2012); ileocolonoscopy (02/20/2007); Femoral-femoral Bypass Graft (Bilateral, 01/03/2014); Endarterectomy femoral (Left, 01/03/2014); Intraoperative arteriogram (Left, 01/03/2014); abdominal angiogram (N/A, 01/02/2014); Cardiac catheterization (01/02/2014); arch aortogram (01/02/2014); Wound exploration (Right, 02/15/2014); left heart catheterization with coronary/graft angiogram (N/A, 02/20/2014); and TRAM (Right, 02/21/2014). Prior to Admission medications   Medication Sig Start Date End Date Taking? Authorizing Provider  aspirin 81 MG tablet Take 1 tablet (81 mg total) by mouth daily. 05/08/13  Yes Salley Scarlet, MD  ferrous sulfate 325 (65 FE) MG tablet Take 325 mg by mouth 2 (two) times daily. 02/01/14  Yes Historical Provider, MD  gabapentin (NEURONTIN) 300 MG capsule Take 1 capsule (300 mg total) by mouth 3 (three) times daily. 06/21/13  Yes Salley Scarlet, MD  Insulin Detemir (LEVEMIR FLEXTOUCH) 100 UNIT/ML Pen Inject 45 Units into the skin daily at 10 pm. Patient taking differently: Inject 50 Units into the skin daily at 10 pm.  10/24/13  Yes Salley Scarlet, MD  insulin lispro (HUMALOG) 100 UNIT/ML KiwkPen Inject 12 Units into the skin 3 (three) times daily with meals. 06/24/13  Yes Salley Scarlet,  MD  lisinopril (PRINIVIL,ZESTRIL) 5 MG tablet Take 5 mg by  mouth daily. 02/01/14  Yes Historical Provider, MD  metoprolol (LOPRESSOR) 50 MG tablet Take 25 mg by mouth 2 (two) times daily. 02/01/14 02/16/14 Yes Historical Provider, MD  simvastatin (ZOCOR) 10 MG tablet Take 1 tablet (10 mg total) by mouth at bedtime. 06/21/13  Yes Salley Scarlet, MD  sulfamethoxazole-trimethoprim (BACTRIM DS,SEPTRA DS) 800-160 MG per tablet Take 1 tablet by mouth 2 (two) times daily. 02/10/14  Yes Salley Scarlet, MD  traMADol (ULTRAM) 50 MG tablet Take by mouth every 6 (six) hours as needed.   Yes Historical Provider, MD  triamcinolone cream (KENALOG) 0.1 % Apply 1 application topically 2 (two) times daily. Apply to leg 08/28/13  Yes Salley Scarlet, MD  acetaminophen (TYLENOL) 500 MG tablet Take 2 tablets (1,000 mg total) by mouth every 6 (six) hours as needed. Pain. 05/08/13   Salley Scarlet, MD  albuterol (PROVENTIL HFA;VENTOLIN HFA) 108 (90 BASE) MCG/ACT inhaler Inhale 2 puffs into the lungs every 4 (four) hours as needed. 06/21/13   Salley Scarlet, MD  BAYER CONTOUR NEXT TEST test strip USE TO TEST BLOOD SUGAR LEVELS THREE TIMES DAILY 02/03/14   Salley Scarlet, MD  cloNIDine (CATAPRES - DOSED IN MG/24 HR) 0.1 mg/24hr patch Place 0.1 mg onto the skin once a week.    Historical Provider, MD  metoprolol tartrate (LOPRESSOR) 25 MG tablet Take 1 tablet (25 mg total) by mouth 2 (two) times daily. Patient not taking: Reported on 02/11/2014 06/21/13   Salley Scarlet, MD   Allergies  Allergen Reactions  . Morphine And Related     Pt felt flushed and was diaphoretic     FAMILY HISTORY:  indicated that his mother is alive. He indicated that his father is deceased. He indicated that both of his sisters are alive. He indicated that his brother is alive. He indicated that his son is alive.  SOCIAL HISTORY:  reports that he quit smoking about 9 years ago. His smoking use included Cigarettes. He has a 50 pack-year smoking history. He quit smokeless tobacco use about 18 years  ago. He reports that he does not drink alcohol or use illicit drugs.  REVIEW OF SYSTEMS:  As per HPI - All other systems reviewed and were neg.    SUBJECTIVE:   VITAL SIGNS: Temp:  [98.5 F (36.9 C)-98.7 F (37.1 C)] 98.5 F (36.9 C) (01/13 0810) Pulse Rate:  [70-72] 72 (01/13 1234) Resp:  [17-24] 17 (01/13 1234) BP: (130-186)/(71-99) 161/75 mmHg (01/13 1234) SpO2:  [100 %] 100 % (01/13 1234) Weight:  [215 lb 6.2 oz (97.7 kg)] 215 lb 6.2 oz (97.7 kg) (01/13 0552) HEMODYNAMICS:   VENTILATOR SETTINGS:   INTAKE / OUTPUT:  Intake/Output Summary (Last 24 hours) at 02/26/14 1407 Last data filed at 02/26/14 1349  Gross per 24 hour  Intake    200 ml  Output   1700 ml  Net  -1500 ml    PHYSICAL EXAMINATION: General:  Chronically ill appearing male, NAD  Neuro:  Awake, pleasantly confused, intermittent lethargy  HEENT:  Mm dry, no JVD  Cardiovascular:  s1s2 distant  Lungs:  resps even, mildly labored at times, wet cough, coarse throughout, no audible wheeze  Abdomen:  Soft, round, +bs  Musculoskeletal:  Warm and dry, no sig edema, R groin dressing c/d   LABS:  CBC  Recent Labs Lab 02/23/14 0310 02/25/14 0615 02/26/14 0035  WBC 10.7* 13.5* 11.7*  HGB 8.4* 8.2* 8.1*  HCT 27.5* 26.3* 25.8*  PLT 421* 417* 396   Coag's No results for input(s): APTT, INR in the last 168 hours. BMET  Recent Labs Lab 02/25/14 0615 02/26/14 0035 02/26/14 0524  NA 126* 129* 125*  K 5.1 5.3* 5.1  CL 96 96 95*  CO2 BUN 46* 51* 52*  CREATININE 4.05* 4.26* 4.26*  GLUCOSE 124* 160* 136*   Electrolytes  Recent Labs Lab 02/25/14 0615 02/26/14 0035 02/26/14 0524  CALCIUM 7.8* 8.2* 8.1*   Sepsis Markers No results for input(s): LATICACIDVEN, PROCALCITON, O2SATVEN in the last 168 hours. ABG  Recent Labs Lab 02/24/14 0920 02/26/14 1250  PHART 7.313* 7.284*  PCO2ART 48.2* 48.0*  PO2ART 105.0* 72.8*   Liver Enzymes No results for input(s): AST, ALT, ALKPHOS,  BILITOT, ALBUMIN in the last 168 hours. Cardiac Enzymes No results for input(s): TROPONINI, PROBNP in the last 168 hours. Glucose  Recent Labs Lab 02/25/14 0831 02/25/14 1350 02/25/14 1658 02/25/14 2121 02/26/14 0812 02/26/14 1216  GLUCAP 107* 124* 124* 135* 156* 130*    Imaging Ct Head Wo Contrast  02/25/2014   CLINICAL DATA:  64 year old with confusion  EXAM: CT HEAD WITHOUT CONTRAST  TECHNIQUE: Contiguous axial images were obtained from the base of the skull through the vertex without intravenous contrast.  COMPARISON:  12/31/2013  FINDINGS: The gray-white differentiation is preserved.  There is no evidence of an acute infarct.  There is no acute intracranial hemorrhage.  There is no midline shift or mass effect.  There is no extra-axial fluid collection.  Soft tissue opacification is noted in the partially visualized right ethmoid sinuses. The remaining paranasal sinuses and mastoid air cells are aerated.  The orbits are intact.  IMPRESSION: 1. No acute intracranial process. 2. Partially visualized right ethmoid sinus disease.   Electronically Signed   By: Fannie Knee   On: 02/25/2014 11:28     ASSESSMENT / PLAN:  PULMONARY Acute hypoxic respiratory failure - r/t decompensated OSA, SIRS with demand ischemia +/- volume overload  COPD  Suspect OSA  R/o HCAP  P:   Supplemental O2  tx ICU  bipap now  F/u ABG  F/u CXR now  PRN BD    CARDIOVASCULAR CVL LUE PICC 1/13>>> Sepsis HTN  Elevated troponin -- abnormal stress test followed by cardiac cath 1/7 show patent CABG grafts P:  abx as below  Hold on further volume for now  F/u troponin  Consider f/u echo  Cont isosorbide, clonidine, lopressor, hydralazine, statin    RENAL Acute on CKD (baseline Scr ~1.8) - likely multifactorial r/t SIRS, hypotension, Vanc, IV contrast  Hyponatremia  Hyperkalemia - mild  P:   F/u chem  Renal following  Lasix x 1 given 1/13 Urine studies    GASTROINTESTINAL No active  issue  P:   NPO for now   HEMATOLOGIC Anemia  P:  F/u cbc  SQ heparin  Transfuse for hgb <6   INFECTIOUS R groin abscess - MRSA  P:   BCx2 12/29>>> neg  UC 12/29>>> neg  R groin 1/2>>> MRSA   Rifampin 1/8>>> Daptomycin 1/11>>> ID following - dapto to start per pharmacy when vanc level <15    ENDOCRINE DM  P:   SSI, levemir   NEUROLOGIC AMS/ metabolic encephalopathy r/t SIRS, hypoxia, hyponatremia  P:   Hold all sedating meds  Monitor mental status with bipap  F/u ABG  Correct Na as above     FAMILY  -  Updates:  Wife updated at bedside  - Inter-disciplinary family meet or Palliative Care meeting due by:  1/20    Dirk DressKaty Whiteheart, NP 02/26/2014  2:07 PM Pager: [phone] or [phone]   Attending Note:  I have examined patient, reviewed labs, studies and notes. I have discussed the case with Jasper RilingK Whiteheart, and I agree with the data and plans as amended above. He has COPD and CAD and was admitted to treat a MRSA infected groin graft. He has developed renal failure, encephalopathy and pulmonary infiltrates. This pm he was more confused and ABG showed hypercapnia. He will be moved to ICU for BiPAP, will initiate diuresis. At some risk for intubation for resp staus as well as airway protection. Independent critical care time is 50 minutes.   Levy Pupaobert Cassie Shedlock, MD, PhD 02/26/2014, 3:51 PM [facility] [phone] or if no answer [phone]

## 2014-02-26 NOTE — Progress Notes (Signed)
On rounds following up on patient transfer - patient with increased confusion - low NA - pulled PICC line last pm - patient in procedure with VAST team today replacing PICC line.  Post procedure worked with PT - stood by bed and marched in place.  On exam post activity patient with increased SOB - using abdominal muscles - blowing resps - weak cough - wet sounding - lung fields difficult to assess secondary to body habitus and increased RR and shallow breaths and restless.   Some white secretions to back throat with cough - suctioned - little gag - moderate cough reflex.  Patient with slurred speech - tongue midline - wife reports slurred speech started Sunday although according to staff her reports have varied.  Can understand some speech but slurred.MAE x 4.   Abd large soft no grimace to palp.  Right thigh DDI.  161/75 paced rhythm 72 RR 24-28 O2 sats 94% 2 liter nasal cannula. CBG 130.   No JVD noted. Labs reviewed - Creatinine climbing - Na+ falling over last 3 days.  Concerned for airway protection and increased confusion agitation with increased WOB.  Fleet Contrasachel RN spoke with Dr. Benjamine MolaVann who came to see patient.  ABG ordered.  PCCM consulted - to bedside - decision to transfer to ICU and BiPap.  PCXR done.  Handoff to Electronic Data Systemsachel RN - will follow as needed. Wife and patient updated by Dr. Benjamine MolaVann and Dr. Delton CoombesByrum.

## 2014-02-26 NOTE — Progress Notes (Signed)
2 RNs unable to get peripheral IV. IV team notified. On call NP notified and made aware that patient will be going home on abx. Awaiting new orders.

## 2014-02-26 NOTE — Progress Notes (Signed)
eLink Physician-Brief Progress Note Patient Name: Collin LeitzWilliam F [name] DOB: 09/30/1950 MRN: [id number]   Date of Service  02/26/2014  HPI/Events of Note  overloaed per PCCm today, remain s on BIPAP, limited responmse lower lasix   eICU Interventions  Lasix repeat higher dose     Intervention Category Major Interventions: Hypoxemia - evaluation and management  Nelda BucksFEINSTEIN,DANIEL J. 02/26/2014, 4:46 PM

## 2014-02-26 NOTE — Progress Notes (Signed)
[facility] PROGRESS NOTE Interim history: 64 year old male with past medical history a recent aorta femoral bypass, who came in with acute hypoxic respiratory failure, sepsis and elevated cardiac enzymes. Started on empiric antibiotics. CT imaging of the Chest was done in ED showed no PE. He also developed acute kidney injury probably due to sepsis, ACE inhibitor and contrast-induced nephropathy which resolved. Groin ultrasound showed abscess, vascular surgery consulted recommended a CT scan of the abdomen and pelvis with contrast emergently on 02/14/2014 which showed multiple loculated abscesses and possible infected native graft, he is  status post I&D and possible graft infection on 02/15/2014. Blood cultures negative but abscess cultures are growing MRSA, continued on vancomycin and cefepime was discontinued.  He will need an ischemic evaluation.  He was transfused 1 unit of PRBC.  Given clinical improvement, he was transferred out of stepdown to telemetry on 02/17/2014.  Renal function continued to worsen after surgery.  Mental status has waxed and waned.  Pulled out PICC and drain last PM- will transfer to sDU for now    [facility] RehabilitationFiled Weights   02/12/14 0535 02/23/14 1516 02/26/14 0552  Weight: 88.2 kg (194 lb 7.1 oz) 95.5 kg (210 lb 8.6 oz) 97.7 kg (215 lb 6.2 oz)        Intake/Output Summary (Last 24 hours) at 02/26/14 1115 Last data filed at 02/26/14 1051  Gross per 24 hour  Intake    440 ml  Output   1775 ml  Net  -1335 ml     Assessment/Plan: Sepsis/Due to R groin abscess with infected graft: - Cultures growing MRSA, continue on vancomycin.  - Ct angio of graft showed multiple abscess and possible infected native graft. Vascular and Plastic Surgery, onboard.  S/p surgery 1/8 - Blood cultures negative and final. - S/p I&D of groin abscess, cultures from 02/15/2013 MRSA. - ID to help with abx- 6 weeks of abx- doxy (due to renal failure) and rifampin  Acute respiratory failure  with hypoxia - Likely due to sepsis of the groin and demand ischemia.  monito  AKI (acute kidney injury)/acute toxic nephropathy on CKD (baseline 1.8ish) - Multifactorial due to sepsis, ACE inhibitor and contrast-induced nephropathy. ACE held. -renal consult- gentle IVF per Dr. Hyman HopesWebb - placed foley, no obstruction +6L since admission if I/Os are correct- ?if lasix needed- defer to nephrology D/c Neurontin  Sleepiness/confusion -ABG does not show significant issues -sun downed last PM- did not sleep well D/c neurontin bipap at night- suspect sleep apnea -CT head no sign of stroke -ammonia ok  Elevated Troponin's: - Cont aspirin, and beta blockers. Cardiology consulted -Abnormal stress test followed by cardiac cath 1/7 showed patent all bypass grafts and cardiology states low risk of major ischemic events related to surgery and no further cardiac testing at this point.  Hyponatremia -monitor  Chronic obstructive pulmonary disease - Stable.  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  Essential hypertension - Mildly uncontrolled and fluctuating.  -hydralazine added 1/9  Normocytic anemia: - S/p 2 units of packed red blood cells. - will transfuse for < 8 due to cardiac hx - Stable.   Code Status: Full code  DVT Prophylaxis: SCDs/heparin Family Communication: patient/wife Disposition Plan: SNF when medically ready   Consultants:  Cardiology  Vascular Surgery  Plastic Surgery  ID  renal  Procedures: ECHO: EF normal no wall motion. Pelvic US 12.30.2015: Large 11.4 x 10.0 x 4.6 cm right groin complex  fluid collection noted  LHC 02/20/14  Antibiotics:  Vancomycin 02/11/2014   Cefepime 02/11/2014 -> 02/17/2013.  HPI/Subjective: Episodes of confusion last PM- pulled out PICC and drain   Objective: Filed Vitals:   02/26/14 0551 02/26/14 0552 02/26/14 0810 02/26/14 1049  BP:  186/85 186/85 157/99 170/72  Pulse: 70   72  Temp: 98.7 F (37.1 C)  98.5 F (36.9 C)   TempSrc: Oral Oral Oral   Resp: Height:      Weight:  97.7 kg (215 lb 6.2 oz)    SpO2: 100% 100%       Exam:  Physical Exam: General: Awake, speech difficult to understand (baseline per family) CV: S1 and S2, RRR. No JVD or murmurs.  Lungs: diminished at bases Abdomen: Soft, Nontender, +bowel sounds, obese Ext: Good pulses. Trace edema.    Data Reviewed: Basic Metabolic Panel:  Recent Labs Lab 02/23/14 0310 02/24/14 0605 02/25/14 0615 02/26/14 0035 02/26/14 0524  NA 131* 127* 126* 129* 125*  K 5.2* 5.1 5.1 5.3* 5.1  CL 98 94* 96 96 95*  CO2 GLUCOSE 157* 152* 124* 160* 136*  BUN 34* 43* 46* 51* 52*  CREATININE 3.10* 3.88* 4.05* 4.26* 4.26*  CALCIUM 8.4 8.2* 7.8* 8.2* 8.1*   Liver Function Tests: No results for input(s): AST, ALT, ALKPHOS, BILITOT, PROT, ALBUMIN in the last 168 hours. No results for input(s): LIPASE, AMYLASE in the last 168 hours.  Recent Labs Lab 02/25/14 1200  AMMONIA 23   CBC:  Recent Labs Lab 02/20/14 0500 02/21/14 0350 02/23/14 0310 02/25/14 0615 02/26/14 0035  WBC 11.2* 11.0* 10.7* 13.5* 11.7*  HGB 8.7* 8.6* 8.4* 8.2* 8.1*  HCT 29.0* 28.8* 27.5* 26.3* 25.8*  MCV 84.8 84.2 83.3 82.4 83.8  PLT 420* 410* 421* 417* 396   Cardiac Enzymes:  Recent Labs Lab 02/25/14 0615  CKTOTAL 158   BNP (last 3 results) No results for input(s): PROBNP in the last 8760 hours. CBG:  Recent Labs Lab 02/25/14 0831 02/25/14 1350 02/25/14 1658 02/25/14 2121 02/26/14 0812  GLUCAP 107* 124* 124* 135* 156*    Recent Results (from the past 240 hour(s))  MRSA PCR Screening     Status: None   Collection Time: 02/21/14  4:11 AM  Result Value Ref Range Status   MRSA by PCR NEGATIVE NEGATIVE Final    Comment:        The GeneXpert MRSA Assay (FDA approved for NASAL specimens only), is one component of a comprehensive MRSA  colonization surveillance program. It is not intended to diagnose MRSA infection nor to guide or monitor treatment for MRSA infections.      Studies: Ct Head Wo Contrast  02/25/2014   CLINICAL DATA:  64 year old with confusion  EXAM: CT HEAD WITHOUT CONTRAST  TECHNIQUE: Contiguous axial images were obtained from the base of the skull through the vertex without intravenous contrast.  COMPARISON:  12/31/2013  FINDINGS: The gray-white differentiation is preserved.  There is no evidence of an acute infarct.  There is no acute intracranial hemorrhage.  There is no midline shift or mass effect.  There is no extra-axial fluid collection.  Soft tissue opacification is noted in the partially visualized right ethmoid sinuses. The remaining paranasal sinuses and mastoid air cells are aerated.  The orbits are intact.  IMPRESSION: 1. No acute intracranial process. 2. Partially visualized right ethmoid sinus disease.   Electronically Signed   By: Fannie Knee  On: 02/25/2014 11:28    Scheduled Meds: . antiseptic oral rinse  7 mL Mouth Rinse BID  . aspirin  81 mg Oral Daily  . atorvastatin  80 mg Oral q1800  . cloNIDine  0.1 mg Transdermal Weekly  . ferrous sulfate  325 mg Oral BID  . heparin subcutaneous  5,000 Units Subcutaneous 3 times per day  . hydrALAZINE  50 mg Oral 3 times per day  . insulin aspart  0-20 Units Subcutaneous TID WC  . insulin aspart  0-5 Units Subcutaneous QHS  . insulin detemir  25 Units Subcutaneous Q2200  . isosorbide mononitrate  30 mg Oral Daily  . metoprolol  25 mg Oral BID  . pantoprazole  40 mg Oral Daily  . rifampin  300 mg Oral Q12H  . tamsulosin  0.4 mg Oral Daily  . triamcinolone cream  1 application Topical BID   Continuous Infusions: . dextrose 5 % and 0.45% NaCl Stopped (02/26/14 0701)    Time: 25 minutes.  Chalmers Cater [facility] Pager [phone] If 7PM-7AM, please contact night-coverage [email or web address] Password [email or web address] 02/26/2014, 11:15  AM      LOS: 15 days

## 2014-02-26 NOTE — Progress Notes (Signed)
POD#5 gracilis muscle flap to right groin  Elevated Cr- stable since yesterday See nursing notes- pt pulled drain from groin and did not hold suction, removed last night Similar incident with PICC  Temp:  [98.7 F (37.1 C)-99.3 F (37.4 C)] 98.7 F (37.1 C) (01/13 0551) Pulse Rate:  [70] 70 (01/13 0551) Resp:  [20] 20 (01/13 0552) BP: (130-186)/(71-142) 186/85 mmHg (01/13 0552) SpO2:  [99 %-100 %] 100 % (01/13 0552) Weight:  [97.7 kg (215 lb 6.2 oz)] 97.7 kg (215 lb 6.2 oz) (01/13 0552)   JP 575  PE Right thigh dressings changed: unable to express any fluid from incisions, Drain serous, watery  No cellulitis  Drain full and drained 100 ml.  A/P Dry dressings to groin and thigh donor site daily High output drains likely in part lymphatic leak given groin and upper thigh area- will need to observe for seroma, swelling of area  I recommend transfer to higher acuity floor if patient continues to be agitated and disrupt therapies  DVT px- SCDs not on patient, heparin    Glenna FellowsBrinda Orrin Yurkovich, MD [facility] Plastic & Reconstructive Surgery [phone]

## 2014-02-26 NOTE — Progress Notes (Deleted)
Rounding on patient. Pt had thrown safety mittens across the room. CPAP not on and pt refused to allow the nurse to place the mask on his face. 2L of O2 via nasal cannula was applied. Pt would not allow nurse to put mitts back on. Continues to fight and refuse. All tubes and drains hidden from patient. Will round on patient more frequently.

## 2014-02-26 NOTE — Progress Notes (Signed)
Physical Therapy Treatment Patient Details Name: [name] MRN: [id number] DOB: 04/29/1950 Today's Date: 02/26/2014    History of Present Illness Patient is a 64 yo male admitted 02/11/14 with acute hypoxic respiratory failure, sepsis and elevated cardiac enzymes.  CT showed Rt groin abscesses and possible graft infection.  Patient s/p I&D on 02/15/14, and s/p muscle flap on 02/21/14.   PMH:  Aortofemoral BPG, DM, COPD, HTN, ASCVD, CABG, PVD, peripheral neuropathy    PT Comments    Pt currently with mitts on due to trying to pull out tubes. Pt asking PTs to remove mitts but PTs explained why the mitts must stay on. Pt demonstrating improved mobility during today session. Will attempt ambulation next session with a third person following pt with chair. Pt able to perform standing marching and weight shifting in standing with use of RW for support. Pt's SpO2 92% sitting EOB and performing exercises.  Pt will continue to benefit from PT to focus on increased independence and mobility.    Follow Up Recommendations  SNF;Supervision/Assistance - 24 hour     Equipment Recommendations  3in1 (PT)    Recommendations for Other Services       Precautions / Restrictions Precautions Precautions: Fall Precaution Comments: Pt with mitts on due to attempts to pull out drains and tubes.  Restrictions Weight Bearing Restrictions: No    Mobility  Bed Mobility Overal bed mobility: Needs Assistance Bed Mobility: Supine to Sit     Supine to sit: Min assist Sit to supine: Mod assist   General bed mobility comments: Pt peforming better than previous session. Requiring min assist for bed mobility with encouragement. Once sitting EOB started to lean on Left forearm until feet were firmly on floor. Pt mod assist to get back into bed for trunk control and helping to bring legs up.   Transfers Overall transfer level: Needs assistance Equipment used: Rolling walker (2 wheeled) Transfers: Sit to/from  Stand Sit to Stand: Mod assist;+2 physical assistance         General transfer comment: Pt mod assist to transition from lowered bed to standing with use of RW. Pt cued to push up from bed and unable to perform so he grabbed RW to pull up on and pt able to achieve standing. Patient able to stand x2 with PT before becoming very fatigued.   Ambulation/Gait Ambulation/Gait assistance: Min assist   Assistive device: Rolling walker (2 wheeled)       General Gait Details: Pt able to take a few side steps up toward head of the bed. Unable to ambulate further due to tubing, patient fatigue and safety. Pt without loss of balance but required cuing for sequencing.    Stairs            Wheelchair Mobility    Modified Rankin (Stroke Patients Only)       Balance Overall balance assessment: Needs assistance         Standing balance support: During functional activity;Bilateral upper extremity supported Standing balance-Leahy Scale: Poor Standing balance comment: Pt requiring use of RW during standing.                     Cognition Arousal/Alertness: Awake/alert Behavior During Therapy: Anxious Overall Cognitive Status: Impaired/Different from baseline Area of Impairment: Attention;Following commands;Problem solving   Current Attention Level: Focused   Following Commands: Follows one step commands consistently;Follows one step commands with increased time     Problem Solving: Slow processing;Requires verbal cues;Requires tactile  cues;Difficulty sequencing      Exercises General Exercises - Lower Extremity Long Arc Quad: AROM;Both;10 reps;Seated Hip Flexion/Marching: AROM;Both;10 reps;Supine    General Comments        Pertinent Vitals/Pain Pain Assessment: No/denies pain    Home Living                      Prior Function            PT Goals (current goals can now be found in the care plan section) Progress towards PT goals: Progressing  toward goals    Frequency  Min 3X/week    PT Plan Current plan remains appropriate    Co-evaluation             End of Session Equipment Utilized During Treatment: Gait belt;Oxygen Activity Tolerance: Patient limited by fatigue Patient left: in bed;with call bell/phone within reach;with family/visitor present     Time: 1208-1228 PT Time Calculation (min) (ACUTE ONLY): 20 min  Charges:  $Therapeutic Activity: 8-22 mins                    G CodesYork Spaniel SPT 02/26/2014, 1:09 PM  York Spaniel, SPT  Acute Rehabilitation [phone]

## 2014-02-26 NOTE — Progress Notes (Signed)
NP put in order for PICC insertion. IV team notified.

## 2014-02-26 NOTE — Progress Notes (Signed)
Day RN notified about pts BP. He was given PO hydralazine this morning.

## 2014-02-26 NOTE — Progress Notes (Signed)
12:34 PM Rapid RN at bedside. Concern for patient's increase work of breathing, slurred incomprehensible speech not at baseline. Pt following commands. Respirations 17, Sp02 100% on 2L nasal cannula, BP 161/75, HR 72. CBG at 1216 resulted 130. Dr Benjamine MolaVann paged and aware, to come see patient at bedside.

## 2014-02-26 NOTE — Progress Notes (Signed)
   VASCULAR SURGERY ASSESSMENT & PLAN:  * Afeb and WBC coming doing  * Doing well s/p gracilis flap by Dr. Leta Baptisthimmappa.  *  To continue ABx long term.   SUBJECTIVE: Still confused. He pulled his PIC line out.   PHYSICAL EXAM: Filed Vitals:   02/25/14 2145 02/25/14 2301 02/26/14 0551 02/26/14 0552  BP:  161/86 186/85 186/85  Pulse:   70   Temp:   98.7 F (37.1 C)   TempSrc:   Oral Oral  Resp: 20  20 20   Height:      Weight:    215 lb 6.2 oz (97.7 kg)  SpO2:   100% 100%   Incisions look good Feet warm and well perfused.   LABS: Lab Results  Component Value Date   WBC 11.7* 02/26/2014   HGB 8.1* 02/26/2014   HCT 25.8* 02/26/2014   MCV 83.8 02/26/2014   PLT 396 02/26/2014   Lab Results  Component Value Date   CREATININE 4.26* 02/26/2014   Lab Results  Component Value Date   INR 1.17 02/12/2014   CBG (last 3)   Recent Labs  02/25/14 1350 02/25/14 1658 02/25/14 2121  GLUCAP 124* 124* 135*    [id number] Daley [name] Beeper: [phone] 02/26/2014

## 2014-02-26 NOTE — Progress Notes (Signed)
[facility]       Subjective: On bipap   Antibiotics:  Anti-infectives    Start     Dose/Rate Route Frequency Ordered Stop   02/21/14 2200  rifampin (RIFADIN) capsule 300 mg     300 mg Oral Every 12 hours 02/21/14 2015     02/21/14 1049  polymyxin B 500,000 Units, bacitracin 50,000 Units in sodium chloride irrigation 0.9 % 500 mL irrigation  Status:  Discontinued       As needed 02/21/14 1050 02/21/14 1209   02/16/14 2000  ceFEPIme (MAXIPIME) 1 g in dextrose 5 % 50 mL IVPB  Status:  Discontinued     1 g100 mL/hr over 30 Minutes Intravenous Every 12 hours 02/16/14 0938 02/17/14 1357   02/14/14 0730  ceFEPIme (MAXIPIME) 1 g in dextrose 5 % 50 mL IVPB  Status:  Discontinued     1 g100 mL/hr over 30 Minutes Intravenous Every 24 hours 02/13/14 1035 02/16/14 0938   02/12/14 1000  oseltamivir (TAMIFLU) capsule 75 mg  Status:  Discontinued     75 mg Oral 2 times daily 02/12/14 0743 02/12/14 0747   02/12/14 1000  oseltamivir (TAMIFLU) capsule 30 mg  Status:  Discontinued     30 mg Oral 2 times daily 02/12/14 0749 02/16/14 0826   02/12/14 0600  vancomycin (VANCOCIN) IVPB 750 mg/150 ml premix  Status:  Discontinued     750 mg150 mL/hr over 60 Minutes Intravenous Every 12 hours 02/11/14 2047 02/23/14 1038   02/12/14 0000  ceFEPIme (MAXIPIME) 1 g in dextrose 5 % 50 mL IVPB  Status:  Discontinued     1 g100 mL/hr over 30 Minutes Intravenous Every 8 hours 02/11/14 2047 02/13/14 1035   02/11/14 1545  vancomycin (VANCOCIN) 1,500 mg in sodium chloride 0.9 % 500 mL IVPB     1,500 mg250 mL/hr over 120 Minutes Intravenous  Once 02/11/14 1537 02/11/14 1940   02/11/14 1545  ceFEPIme (MAXIPIME) 2 g in dextrose 5 % 50 mL IVPB     2 g100 mL/hr over 30 Minutes Intravenous  Once 02/11/14 1537 02/11/14 1642      Medications: Scheduled Meds: . antiseptic oral rinse  7 mL Mouth Rinse BID  . aspirin  81 mg Oral Daily  . atorvastatin  80 mg Oral q1800  . cloNIDine  0.1 mg  Transdermal Weekly  . furosemide  160 mg Intravenous Once  . heparin subcutaneous  5,000 Units Subcutaneous 3 times per day  . hydrALAZINE  50 mg Oral 3 times per day  . insulin aspart  0-20 Units Subcutaneous TID WC  . insulin aspart  0-5 Units Subcutaneous QHS  . insulin detemir  25 Units Subcutaneous Q2200  . ipratropium-albuterol  3 mL Nebulization Q6H  . isosorbide mononitrate  30 mg Oral Daily  . metoprolol  25 mg Oral BID  . pantoprazole  40 mg Oral Daily  . rifampin  300 mg Oral Q12H  . triamcinolone cream  1 application Topical BID   Continuous Infusions:   PRN Meds:.acetaminophen, albuterol, calcium carbonate, hydrALAZINE, ondansetron **OR** ondansetron (ZOFRAN) IV, sodium chloride, sodium chloride    Objective: Weight change:   Intake/Output Summary (Last 24 hours) at 02/26/14 1708 Last data filed at 02/26/14 1438  Gross per 24 hour  Intake    200 ml  Output   1750 ml  Net  -1550 ml   Blood pressure 157/77, pulse 70, temperature 98.7 F (37.1 C), temperature source Oral,  resp. rate 14, height  (1.651 m), weight 220 lb 7.4 oz (100 kg), SpO2 100 %. Temp:  [98.5 F (36.9 C)-98.7 F (37.1 C)] 98.7 F (37.1 C) (01/13 1438) Pulse Rate:  [70-72] 70 (01/13 1600) Resp:  [14-117] 14 (01/13 1600) BP: (157-186)/(71-99) 157/77 mmHg (01/13 1600) SpO2:  [99 %-100 %] 100 % (01/13 1600) FiO2 (%):  [40 %] 40 % (01/13 1510) Weight:  [215 lb 6.2 oz (97.7 kg)-220 lb 7.4 oz (100 kg)] 220 lb 7.4 oz (100 kg) (01/13 1438)  Physical Exam: General: on bipap HEENT: , EOMI CVS regular rate, normal r,  no murmur rubs or gallops Chest: more coarse breath sounds Skin GROIN With dressing Neuro: nonfocal  CBC:  CBC Latest Ref Rng 02/26/2014 02/25/2014 02/23/2014  WBC 4.0 - 10.5 K/uL 11.7(H) 13.5(H) 10.7(H)  Hemoglobin 13.0 - 17.0 g/dL 8.1(L) 8.2(L) 8.4(L)  Hematocrit 39.0 - 52.0 % 25.8(L) 26.3(L) 27.5(L)  Platelets 150 - 400 K/uL 396 417(H) 421(H)      BMET  Recent Labs   02/26/14 0035 02/26/14 0524  NA 129* 125*  K 5.3* 5.1  CL 96 95*  CO2 26 21  GLUCOSE 160* 136*  BUN 51* 52*  CREATININE 4.26* 4.26*  CALCIUM 8.2* 8.1*     Liver Panel  No results for input(s): PROT, ALBUMIN, AST, ALT, ALKPHOS, BILITOT, BILIDIR, IBILI in the last 72 hours.     Sedimentation Rate No results for input(s): ESRSEDRATE in the last 72 hours. C-Reactive Protein No results for input(s): CRP in the last 72 hours.  Micro Results: Recent Results (from the past 720 hour(s))  Blood culture (routine x 2)     Status: None   Collection Time: 02/11/14  2:29 PM  Result Value Ref Range Status   Specimen Description BLOOD RIGHT HAND  Final   Special Requests BOTTLES DRAWN AEROBIC AND ANAEROBIC 4CC  Final   Culture NO GROWTH 5 DAYS  Final   Report Status 02/16/2014 FINAL  Final  Blood culture (routine x 2)     Status: None   Collection Time: 02/11/14  2:33 PM  Result Value Ref Range Status   Specimen Description BLOOD RIGHT ANTECUBITAL  Final   Special Requests BOTTLES DRAWN AEROBIC ONLY 6CC  Final   Culture NO GROWTH 5 DAYS  Final   Report Status 02/16/2014 FINAL  Final  Urine culture     Status: None   Collection Time: 02/11/14  2:56 PM  Result Value Ref Range Status   Specimen Description URINE, CLEAN CATCH  Final   Special Requests NONE  Final   Colony Count NO GROWTH Performed at [facility]   Final   Culture NO GROWTH Performed at [facility]   Final   Report Status 02/12/2014 FINAL  Final  MRSA PCR Screening     Status: None   Collection Time: 02/11/14  6:40 PM  Result Value Ref Range Status   MRSA by PCR NEGATIVE NEGATIVE Final    Comment:        The GeneXpert MRSA Assay (FDA approved for NASAL specimens only), is one component of a comprehensive MRSA colonization surveillance program. It is not intended to diagnose MRSA infection nor to guide or monitor treatment for MRSA infections.   Wound culture     Status: None    Collection Time: 02/14/14 10:08 AM  Result Value Ref Range Status   Specimen Description WOUND GROIN RIGHT  Final   Special Requests NONE  Final  Gram Stain   Final    NO WBC SEEN NO SQUAMOUS EPITHELIAL CELLS SEEN NO ORGANISMS SEEN [facility]    Culture   Final    NO GROWTH 3 DAYS [facility]formed at [facility]    Report Status 02/17/2014 FINAL  Final  Body fluid culture     Status: None   Collection Time: 02/15/14  9:49 AM  Result Value Ref Range Status   Specimen Description FLUID RIGHT DRAINAGE GROIN  Final   Special Requests NONE  Final   Gram Stain   Final    ABUNDANT WBC PRESENT, PREDOMINANTLY PMN RARE GRAM POSITIVE COCCI IN PAIRS Performed at [facility]    Culture   Final    MODERATE METHICILLIN RESISTANT STAPHYLOCOCCUS AUREUS Note: RIFAMPIN AND GENTAMICIN SHOULD NOT BE USED AS SINGLE DRUGS FOR TREATMENT OF STAPH INFECTIONS. This organism DOES NOT demonstrate inducible Clindamycin resistance in vitro. CRITICAL RESULT CALLED TO, READ BACK BY AND VERIFIED WITH: CAROL@8 :58AM ON  02/17/13 BY DANTS Performed at [facility]    Report Status 02/17/2014 FINAL  Final   Organism ID, Bacteria METHICILLIN RESISTANT STAPHYLOCOCCUS AUREUS  Final      Susceptibility   Methicillin resistant staphylococcus aureus - MIC*    CLINDAMYCIN <=0.25 SENSITIVE Sensitive     ERYTHROMYCIN >=8 RESISTANT Resistant     GENTAMICIN <=0.5 SENSITIVE Sensitive     LEVOFLOXACIN >=8 RESISTANT Resistant     OXACILLIN >=4 RESISTANT Resistant     PENICILLIN >=0.5 RESISTANT Resistant     RIFAMPIN <=0.5 SENSITIVE Sensitive     TRIMETH/SULFA >=320 RESISTANT Resistant     VANCOMYCIN 1 SENSITIVE Sensitive     TETRACYCLINE <=1 SENSITIVE Sensitive     * MODERATE METHICILLIN RESISTANT STAPHYLOCOCCUS AUREUS  MRSA PCR Screening     Status: None   Collection Time: 02/21/14  4:11 AM  Result Value Ref Range Status   MRSA by PCR NEGATIVE NEGATIVE Final    Comment:          The GeneXpert MRSA Assay (FDA approved for NASAL specimens only), is one component of a comprehensive MRSA colonization surveillance program. It is not intended to diagnose MRSA infection nor to guide or monitor treatment for MRSA infections.     Studies/Results: Ct Head Wo Contrast  02/25/2014   CLINICAL DATA:  64 year old with confusion  EXAM: CT HEAD WITHOUT CONTRAST  TECHNIQUE: Contiguous axial images were obtained from the base of the skull through the vertex without intravenous contrast.  COMPARISON:  12/31/2013  FINDINGS: The gray-white differentiation is preserved.  There is no evidence of an acute infarct.  There is no acute intracranial hemorrhage.  There is no midline shift or mass effect.  There is no extra-axial fluid collection.  Soft tissue opacification is noted in the partially visualized right ethmoid sinuses. The remaining paranasal sinuses and mastoid air cells are aerated.  The orbits are intact.  IMPRESSION: 1. No acute intracranial process. 2. Partially visualized right ethmoid sinus disease.   Electronically Signed   By: Fannie KneeKenneth  Crosby   On: 02/25/2014 11:28   Dg Chest Port 1 View  02/26/2014   CLINICAL DATA:  Shortness of Breath  EXAM: PORTABLE CHEST - 1 VIEW  COMPARISON:  02/24/2014  FINDINGS: Cardiomegaly again noted. Status post CABG. Persistent mild interstitial edema bilaterally. Probable small bilateral pleural effusion with bilateral basilar atelectasis or infiltrate. Dual lead cardiac pacemaker is unchanged in position. Stable left arm PICC line position with tip in distal  SVC.  IMPRESSION: Persistent mild interstitial edema bilaterally. Cardiomegaly again noted. Small bilateral pleural effusion with bilateral basilar atelectasis or infiltrate. Status post CABG. Dual lead cardiac pacemaker is unchanged in position.   Electronically Signed   By: Natasha Mead M.D.   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 male with aortobifemoral grafting in 2007. Last November he underwent right femoral artery cannulation for aortogram followed bythrombectomy of the left femoral limb. Postoperatively he had some scrotal swelling and some difficulty healing his right femoral cannulation site with some thin drainage. He developed sudden onset of fever associated with some pain and swelling in his right groin leading to admission on 02/12/2014. His initial fever was 104.4. He was started on empiric antibiotics. Admission blood cultures were negative. An ultrasound of his right groin showed a fluid collection and he underwent incision and drainage on 02/15/2014 revealing an abscess sitting on top of the right femoral graft. Abscess cultures grew MRSA.He underwent a gracilis muscle flap procedure to close his right groin wound. He now has worsening renal fxn and supratherapeutic vancomycin levels and worsening renal failure.  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 We have changed him over to IV daptomycin. We'll continue rifampin orally as well. Need to keep in mind if he gets an HCAP or VAP with MRSA that dapto will NOT work in lungs  I will plan on treating him for at least 6 weeks  with IV therapy likely daptomycin plus oral rifampin.  At that point in time we'll indeed change over to oral therapy likely DOXY and RIFAMPIN (note he is TMPSMX Resistant  #2 AMS change: likely due to renal failure risk 3 failure  #3 Respiratory failure on BiPAP I suspect he is having trouble with controlling volume in the setting of his renal failure I wonder if he is going to need dialysis. He has been moved over to the intensive care unit     LOS: 15 days   Acey Lav 02/26/2014, 5:08 PM

## 2014-02-26 NOTE — Progress Notes (Signed)
Rounding on patient. Pt had thrown safety mittens across the room. CPAP not on and pt refused to allow the nurse to place the mask on his face. 2L of O2 via nasal cannula was applied. Pt had mitts on during this time.  All tubes and drains hidden from patient. Will round on patient more frequently.

## 2014-02-26 NOTE — Progress Notes (Signed)
Went to round on pt and noticed his mitts were thrown across the room. Pts PICC line was no longer intact. A pressure dressing was immediately applied. IV team notified.

## 2014-02-26 NOTE — Progress Notes (Addendum)
Came back up to see patient.  Was laying flat getting PICC Line when became short of breath and had increased work of breathing.  +6L since admission.  Will give IV lasix, get ABG.   SDU orders were placed earlier this AM but patient has not moved yet.  ABG looks to be metabolic acidosis.  Patient is more sleepy than this AM.  Will awake and follow commands. Will ask PCCM to take a look at patient Collin CanaryJessica Darwin Guastella

## 2014-02-26 NOTE — Progress Notes (Signed)
[facility] ROUNDING NOTE   Subjective:   Interval History: PICC line placement today  Objective:  Vital signs in last 24 hours:  Temp:  [98.5 F (36.9 C)-98.7 F (37.1 C)] 98.5 F (36.9 C) (01/13 0810) Pulse Rate:  [70-72] 72 (01/13 1234) Resp:  [17-24] 17 (01/13 1234) BP: (130-186)/(71-99) 161/75 mmHg (01/13 1234) SpO2:  [100 %] 100 % (01/13 1234) Weight:  [97.7 kg (215 lb 6.2 oz)] 97.7 kg (215 lb 6.2 oz) (01/13 0552)  Weight change:  Filed Weights   02/12/14 0535 02/23/14 1516 02/26/14 0552  Weight: 88.2 kg (194 lb 7.1 oz) 95.5 kg (210 lb 8.6 oz) 97.7 kg (215 lb 6.2 oz)    Intake/Output: I/O last 3 completed shifts: In: 610 [P.O.:600; I.V.:10] Out: 1725 [Urine:800; Drains:925]   Intake/Output this shift:  Total I/O In: 200 [P.O.:200] Out: 525 [Urine:275; Drains:250]  CVS- RRR RS- CTA ABD- BS present soft non-distended EXT- no edema   Basic Metabolic Panel:  Recent Labs Lab 02/23/14 0310 02/24/14 0605 02/25/14 0615 02/26/14 0035 02/26/14 0524  NA 131* 127* 126* 129* 125*  K 5.2* 5.1 5.1 5.3* 5.1  CL 98 94* 96 96 95*  CO2 GLUCOSE 157* 152* 124* 160* 136*  BUN 34* 43* 46* 51* 52*  CREATININE 3.10* 3.88* 4.05* 4.26* 4.26*  CALCIUM 8.4 8.2* 7.8* 8.2* 8.1*    Liver Function Tests: No results for input(s): AST, ALT, ALKPHOS, BILITOT, PROT, ALBUMIN in the last 168 hours. No results for input(s): LIPASE, AMYLASE in the last 168 hours.  Recent Labs Lab 02/25/14 1200  AMMONIA 23    CBC:  Recent Labs Lab 02/20/14 0500 02/21/14 0350 02/23/14 0310 02/25/14 0615 02/26/14 0035  WBC 11.2* 11.0* 10.7* 13.5* 11.7*  HGB 8.7* 8.6* 8.4* 8.2* 8.1*  HCT 29.0* 28.8* 27.5* 26.3* 25.8*  MCV 84.8 84.2 83.3 82.4 83.8  PLT 420* 410* 421* 417* 396    Cardiac Enzymes:  Recent Labs Lab 02/25/14 0615  CKTOTAL 158    BNP: Invalid input(s): POCBNP  CBG:  Recent Labs Lab 02/25/14 1350 02/25/14 1658 02/25/14 2121  02/26/14 0812 02/26/14 1216  GLUCAP 124* 124* 135* 156* 130*    Microbiology: Results for orders placed or performed during the hospital encounter of 02/11/14  Blood culture (routine x 2)     Status: None   Collection Time: 02/11/14  2:29 PM  Result Value Ref Range Status   Specimen Description BLOOD RIGHT HAND  Final   Special Requests BOTTLES DRAWN AEROBIC AND ANAEROBIC 4CC  Final   Culture NO GROWTH 5 DAYS  Final   Report Status 02/16/2014 FINAL  Final  Blood culture (routine x 2)     Status: None   Collection Time: 02/11/14  2:33 PM  Result Value Ref Range Status   Specimen Description BLOOD RIGHT ANTECUBITAL  Final   Special Requests BOTTLES DRAWN AEROBIC ONLY 6CC  Final   Culture NO GROWTH 5 DAYS  Final   Report Status 02/16/2014 FINAL  Final  Urine culture     Status: None   Collection Time: 02/11/14  2:56 PM  Result Value Ref Range Status   Specimen Description URINE, CLEAN CATCH  Final   Special Requests NONE  Final   Colony Count NO GROWTH Performed at [facility]   Final   Culture NO GROWTH Performed at [facility]   Final   Report Status 02/12/2014 FINAL  Final  MRSA PCR Screening  Status: None   Collection Time: 02/11/14  6:40 PM  Result Value Ref Range Status   MRSA by PCR NEGATIVE NEGATIVE Final    Comment:        The GeneXpert MRSA Assay (FDA approved for NASAL specimens only), is one component of a comprehensive MRSA colonization surveillance program. It is not intended to diagnose MRSA infection nor to guide or monitor treatment for MRSA infections.   Wound culture     Status: None   Collection Time: 02/14/14 10:08 AM  Result Value Ref Range Status   Specimen Description WOUND GROIN RIGHT  Final   Special Requests NONE  Final   Gram Stain   Final    NO WBC SEEN NO SQUAMOUS EPITHELIAL CELLS SEEN NO ORGANISMS SEEN Performed at [facility]    Culture   Final    NO GROWTH 3 DAYS Performed at [facility]    Report Status 02/17/2014 FINAL  Final  Body fluid culture     Status: None   Collection Time: 02/15/14  9:49 AM  Result Value Ref Range Status   Specimen Description FLUID RIGHT DRAINAGE GROIN  Final   Special Requests NONE  Final   Gram Stain   Final    ABUNDANT WBC PRESENT, PREDOMINANTLY PMN RARE GRAM POSITIVE COCCI IN PAIRS Performed at [facility]    Culture   Final    MODERATE METHICILLIN RESISTANT STAPHYLOCOCCUS AUREUS Note: RIFAMPIN AND GENTAMICIN SHOULD NOT BE USED AS SINGLE DRUGS FOR TREATMENT OF STAPH INFECTIONS. This organism DOES NOT demonstrate inducible Clindamycin resistance in vitro. CRITICAL RESULT CALLED TO, READ BACK BY AND VERIFIED WITH: CAROL@8 :58AM ON  02/17/13 BY DANTS Performed at [facility]    Report Status 02/17/2014 FINAL  Final   Organism ID, Bacteria METHICILLIN RESISTANT STAPHYLOCOCCUS AUREUS  Final      Susceptibility   Methicillin resistant staphylococcus aureus - MIC*    CLINDAMYCIN <=0.25 SENSITIVE Sensitive     ERYTHROMYCIN >=8 RESISTANT Resistant     GENTAMICIN <=0.5 SENSITIVE Sensitive     LEVOFLOXACIN >=8 RESISTANT Resistant     OXACILLIN >=4 RESISTANT Resistant     PENICILLIN >=0.5 RESISTANT Resistant     RIFAMPIN <=0.5 SENSITIVE Sensitive     TRIMETH/SULFA >=320 RESISTANT Resistant     VANCOMYCIN 1 SENSITIVE Sensitive     TETRACYCLINE <=1 SENSITIVE Sensitive     * MODERATE METHICILLIN RESISTANT STAPHYLOCOCCUS AUREUS  MRSA PCR Screening     Status: None   Collection Time: 02/21/14  4:11 AM  Result Value Ref Range Status   MRSA by PCR NEGATIVE NEGATIVE Final    Comment:        The GeneXpert MRSA Assay (FDA approved for NASAL specimens only), is one component of a comprehensive MRSA colonization surveillance program. It is not intended to diagnose MRSA infection nor to guide or monitor treatment for MRSA infections.     Coagulation Studies: No results for input(s): LABPROT, INR in the last 72  hours.  Urinalysis:  Recent Labs  02/25/14 1356  COLORURINE AMBER*  LABSPEC 1.019  PHURINE 5.0  GLUCOSEU NEGATIVE  HGBUR LARGE*  BILIRUBINUR MODERATE*  KETONESUR 15*  PROTEINUR 100*  UROBILINOGEN 1.0  NITRITE NEGATIVE  LEUKOCYTESUR SMALL*      Imaging: Ct Head Wo Contrast  02/25/2014   CLINICAL DATA:  64 year old with confusion  EXAM: CT HEAD WITHOUT CONTRAST  TECHNIQUE: Contiguous axial images were obtained from the base of the skull through the vertex without  intravenous contrast.  COMPARISON:  12/31/2013  FINDINGS: The gray-white differentiation is preserved.  There is no evidence of an acute infarct.  There is no acute intracranial hemorrhage.  There is no midline shift or mass effect.  There is no extra-axial fluid collection.  Soft tissue opacification is noted in the partially visualized right ethmoid sinuses. The remaining paranasal sinuses and mastoid air cells are aerated.  The orbits are intact.  IMPRESSION: 1. No acute intracranial process. 2. Partially visualized right ethmoid sinus disease.   Electronically Signed   By: Fannie KneeKenneth  Crosby   On: 02/25/2014 11:28     Medications:     . antiseptic oral rinse  7 mL Mouth Rinse BID  . aspirin  81 mg Oral Daily  . atorvastatin  80 mg Oral q1800  . cloNIDine  0.1 mg Transdermal Weekly  . ferrous sulfate  325 mg Oral BID  . heparin subcutaneous  5,000 Units Subcutaneous 3 times per day  . hydrALAZINE  50 mg Oral 3 times per day  . insulin aspart  0-20 Units Subcutaneous TID WC  . insulin aspart  0-5 Units Subcutaneous QHS  . insulin detemir  25 Units Subcutaneous Q2200  . isosorbide mononitrate  30 mg Oral Daily  . metoprolol  25 mg Oral BID  . pantoprazole  40 mg Oral Daily  . rifampin  300 mg Oral Q12H  . tamsulosin  0.4 mg Oral Daily  . triamcinolone cream  1 application Topical BID   acetaminophen, calcium carbonate, hydrALAZINE, ipratropium-albuterol, ondansetron **OR** ondansetron (ZOFRAN) IV, sodium chloride,  sodium chloride  Assessment/ Plan:  1.Acute kidney Injury There are multiple reasons that need to be considered. The vancomycin could cause acute interstitial nephritis or acute tubular necrosis and I think that this is quite plausible. The use of IV contrast and catheterization raise the specter of contrast nephropathy or an atheroembolic shower . Creatinine appears a little increased this AM 2. Hyponatremia Probably secondary to excess water intake with worsening renal function 3. Hyperkalemia will follow no contributing drugs identified 4. Anemia will follow would not want to use iron with ongoing infection    LOS: 15 [name] W @TODAY @1 :32 PM

## 2014-02-26 NOTE — Progress Notes (Signed)
Report given to RN on 2100 for transfer

## 2014-02-26 NOTE — Progress Notes (Signed)
ANTIBIOTIC CONSULT NOTE - FOLLOW UP  Pharmacy Consult for Vancomycin -> Daptomycin Indication: MRSA right femoral graft infection  Patient Measurements: Height: 5\' 5"  (165.1 cm) Weight: 215 lb 6.2 oz (97.7 kg) IBW/kg (Calculated) : 61.5  Vital Signs: Temp: 98.5 F (36.9 C) (01/13 0810) Temp Source: Oral (01/13 0810) BP: 170/72 mmHg (01/13 1049) Pulse Rate: 72 (01/13 1049) Intake/Output from previous day: 01/12 0701 - 01/13 0700 In: 480 [P.O.:480] Out: 1375 [Urine:800; Drains:575] Intake/Output from this shift: Total I/O In: 200 [P.O.:200] Out: 525 [Urine:275; Drains:250]  Labs:  Recent Labs  02/25/14 0615 02/26/14 0035 02/26/14 0524  WBC 13.5* 11.7*  --   HGB 8.2* 8.1*  --   PLT 417* 396  --   CREATININE 4.05* 4.26* 4.26*   Estimated Creatinine Clearance: 18.8 mL/min (by C-G formula based on Cr of 4.26).  Recent Labs  02/25/14 0615 02/26/14 0524  VANCORANDOM 29.9 24.2    Assessment:   Day # 16 Vancomycin; day # 6 Rifampin.  Planning 6 weeks of IV antibiotics for MRSA in groin wound culture 02/15/14; then PO doxycycline + Rifampin.  To change to Daptomycin when Vanc level < 15 mcg/ml.  No further Vancomycin planned.     Vanc trough on 02/17/14 was 19.6 mcg/ml on 750 mg IV q12hrs, when Scr 1.37.  Scr trended up to 1.48 on 1/8, then up to 3.10 on 1/10, and now up to 4.26.  Standing Vancomycin regimen discontinued on 1/10 am; last dose given at ~6:30am on 1/10.     Random Vanc level this am (~ 72 hrs after last dose) is 24.2 mcg/ml, coming down slowly and remains supratherapeutic. I/O now negative. Last Lisinopril dose 12/29 prior to admission.  Contrast 02/14/14.  Changed from SCDs to sq heparin for VTE prophylaxis on 02/25/14.   Goal of Therapy:  Vancomycin trough level 15-20 mcg/ml  Plan:   Vancomycin discontinued on 02/23/13.  To begin Daptomycin when Vanc level < 15 mcg/ml.  Recheck random vanc level and bmet in am.  Heparin 5000 units sq q8hrs begun 02/25/13.  Follow up for any bleeding; intermittent CBC.  [name], [name], [place] Pager: [phone] 02/26/2014,11:05 AM

## 2014-02-27 ENCOUNTER — Inpatient Hospital Stay (HOSPITAL_COMMUNITY): Payer: Medicare HMO

## 2014-02-27 DIAGNOSIS — B9562 Methicillin resistant Staphylococcus aureus infection as the cause of diseases classified elsewhere: Secondary | ICD-10-CM

## 2014-02-27 DIAGNOSIS — N19 Unspecified kidney failure: Secondary | ICD-10-CM

## 2014-02-27 DIAGNOSIS — J9690 Respiratory failure, unspecified, unspecified whether with hypoxia or hypercapnia: Secondary | ICD-10-CM | POA: Insufficient documentation

## 2014-02-27 LAB — BLOOD GAS, ARTERIAL
Acid-base deficit: 4 mmol/L — ABNORMAL HIGH (ref 0.0–2.0)
BICARBONATE: 20.7 meq/L (ref 20.0–24.0)
Drawn by: 36989
FIO2: 0.28 %
O2 SAT: 87 %
PATIENT TEMPERATURE: 98.6
PCO2 ART: 38.7 mmHg (ref 35.0–45.0)
PH ART: 7.348 — AB (ref 7.350–7.450)
TCO2: 21.9 mmol/L (ref 0–100)
pO2, Arterial: 56.8 mmHg — ABNORMAL LOW (ref 80.0–100.0)

## 2014-02-27 LAB — GLUCOSE, CAPILLARY
GLUCOSE-CAPILLARY: 105 mg/dL — AB (ref 70–99)
Glucose-Capillary: 75 mg/dL (ref 70–99)
Glucose-Capillary: 167 mg/dL — ABNORMAL HIGH (ref 70–99)
Glucose-Capillary: 148 mg/dL — ABNORMAL HIGH (ref 70–99)

## 2014-02-27 LAB — BASIC METABOLIC PANEL
Anion gap: 13 (ref 5–15)
BUN: 53 mg/dL — ABNORMAL HIGH (ref 6–23)
CALCIUM: 8.6 mg/dL (ref 8.4–10.5)
CO2: 23 mmol/L (ref 19–32)
Chloride: 96 mEq/L (ref 96–112)
Creatinine, Ser: 4.18 mg/dL — ABNORMAL HIGH (ref 0.50–1.35)
GFR calc non Af Amer: 14 mL/min — ABNORMAL LOW (ref >90)
GFR, EST AFRICAN AMERICAN: 16 mL/min — AB (ref >90)
Glucose, Bld: 101 mg/dL — ABNORMAL HIGH (ref 70–99)
POTASSIUM: 5 mmol/L (ref 3.5–5.1)
SODIUM: 132 mmol/L — AB (ref 135–145)

## 2014-02-27 LAB — CBC
HEMATOCRIT: 26.5 % — AB (ref 39.0–52.0)
Hemoglobin: 8.6 g/dL — ABNORMAL LOW (ref 13.0–17.0)
MCH: 26.3 pg (ref 26.0–34.0)
MCHC: 32.5 g/dL (ref 30.0–36.0)
MCV: 81 fL (ref 78.0–100.0)
Platelets: 471 10*3/uL — ABNORMAL HIGH (ref 150–400)
RBC: 3.27 MIL/uL — AB (ref 4.22–5.81)
RDW: 14.5 % (ref 11.5–15.5)
WBC: 12.4 10*3/uL — ABNORMAL HIGH (ref 4.0–10.5)

## 2014-02-27 LAB — MAGNESIUM: Magnesium: 2.1 mg/dL (ref 1.5–2.5)

## 2014-02-27 LAB — PHOSPHORUS: Phosphorus: 6.3 mg/dL — ABNORMAL HIGH (ref 2.3–4.6)

## 2014-02-27 LAB — VANCOMYCIN, RANDOM: Vancomycin Rm: 20.5 ug/mL

## 2014-02-27 MED ORDER — POLYETHYLENE GLYCOL 3350 17 G PO PACK
17.0000 g | PACK | Freq: Every day | ORAL | Status: DC
  Administered 2014-02-28 – 2014-03-02 (×3): 17 g via ORAL
  Filled 2014-02-27 (×6): qty 1

## 2014-02-27 MED ORDER — RIFAMPIN 300 MG PO CAPS
300.0000 mg | ORAL_CAPSULE | Freq: Two times a day (BID) | ORAL | Status: DC
  Administered 2014-02-27 – 2014-03-04 (×10): 300 mg via ORAL
  Filled 2014-02-27 (×12): qty 1

## 2014-02-27 MED ORDER — METOPROLOL TARTRATE 25 MG PO TABS
25.0000 mg | ORAL_TABLET | Freq: Two times a day (BID) | ORAL | Status: DC
  Administered 2014-02-27 – 2014-03-01 (×4): 25 mg via ORAL
  Filled 2014-02-27 (×5): qty 1

## 2014-02-27 NOTE — Progress Notes (Addendum)
PULMONARY / CRITICAL CARE MEDICINE   Name: [name] MRN: [id number] DOB: 02/17/1950    ADMISSION DATE:  02/11/2014 CONSULTATION DATE:  02/26/14  REFERRING MD :  Benjamine MolaVann ([facility])   CHIEF COMPLAINT:  Resp failure, AMS   INITIAL PRESENTATION: 64yo male with complex PMH including HTN,  DM, OSA, COPD, CAD s/p CABG 2003, PVD s/p previous aortobifemoral bypass.  Initially admitted 12/29 with fevers and SOB with elevated troponin.  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  Treated with Vanc and rifampin with plans for 6 weeks abx.  Course has been c/b hyponatremia, AKI with worsening renal function and possible volume overload.  On 1/13 he was laid flat for PICC placement and became increasingly SOB and PCCM consulted.   STUDIES:  CT head 1/12>>> neg acute  2D echo 12/29>>> EF 60-65%, moderate LVH, PASP 59mmHg, moderate to severely dilated LA, trivial pericardial effusion without tamponade physiology   SIGNIFICANT EVENTS: 1/2>>> incision/ drainage R groin abscess  1/8>>> OR>> Gracilis muscle flap to R groin for closure    SUBJECTIVE: Doing well this morning. Reports his breathing has improved. Denies CP, N/V, abdominal pain. Reports he is hungry and is constipated.  VITAL SIGNS: Temp:  [97.8 F (36.6 C)-98.7 F (37.1 C)] 98.6 F (37 C) (01/14 0717) Pulse Rate:  [67-72] 70 (01/14 0700) Resp:  [13-117] 18 (01/14 0700) BP: (124-185)/(53-100) 124/100 mmHg (01/14 0700) SpO2:  [97 %-100 %] 100 % (01/14 0700) FiO2 (%):  [40 %] 40 % (01/14 0114) Weight:  [220 lb 7.4 oz (100 kg)-221 lb 5.5 oz (100.4 kg)] 221 lb 5.5 oz (100.4 kg) (01/14 0500) HEMODYNAMICS:   VENTILATOR SETTINGS: Vent Mode:  [-] BIPAP FiO2 (%):  [40 %] 40 % Set Rate:  [10 bmp] 10 bmp PEEP:  [5 cmH20] 5 cmH20 Pressure Support:  [10 cmH20] 10 cmH20 INTAKE / OUTPUT:  Intake/Output Summary (Last 24 hours) at 02/27/14 0749 Last data filed at 02/27/14 0600  Gross  per 24 hour  Intake    310 ml  Output   1555 ml  Net  -1245 ml    PHYSICAL EXAMINATION: General:  NAD  Neuro:  Awake and alert, no gross deficits HEENT:  Mm dry, no JVD  Cardiovascular:  RRR Lungs:  resps even, coarse throughout, no audible wheeze  Abdomen:  Soft, nontender, mildly distended, +bs  Musculoskeletal:  Warm and dry, no sig edema, R groin dressing c/d   LABS:  CBC  Recent Labs Lab 02/25/14 0615 02/26/14 0035 02/27/14 0500  WBC 13.5* 11.7* 12.4*  HGB 8.2* 8.1* 8.6*  HCT 26.3* 25.8* 26.5*  PLT 417* 396 471*   Coag's No results for input(s): APTT, INR in the last 168 hours. BMET  Recent Labs Lab 02/26/14 0035 02/26/14 0524 02/27/14 0500  NA 129* 125* 132*  K 5.3* 5.1 5.0  CL 96 95* 96  CO2 26 21 23   BUN 51* 52* 53*  CREATININE 4.26* 4.26* 4.18*  GLUCOSE 160* 136* 101*   Electrolytes  Recent Labs Lab 02/26/14 0035 02/26/14 0524 02/27/14 0500  CALCIUM 8.2* 8.1* 8.6  MG  --   --  2.1  PHOS  --   --  6.3*   Sepsis Markers No results for input(s): LATICACIDVEN, PROCALCITON, O2SATVEN in the last 168 hours. ABG  Recent Labs Lab 02/26/14 1250 02/26/14 1725 02/27/14 0337  PHART 7.284* 7.241* 7.348*  PCO2ART 48.0* 52.8* 38.7  PO2ART 72.8* 71.0*  56.8*   Liver Enzymes No results for input(s): AST, ALT, ALKPHOS, BILITOT, ALBUMIN in the last 168 hours. Cardiac Enzymes No results for input(s): TROPONINI, PROBNP in the last 168 hours. Glucose  Recent Labs Lab 02/25/14 2121 02/26/14 0812 02/26/14 1216 02/26/14 1437 02/26/14 1712 02/26/14 2200  GLUCAP 135* 156* 130* 109* 96 100*    Imaging Dg Chest Port 1 View  02/26/2014   CLINICAL DATA:  Shortness of Breath  EXAM: PORTABLE CHEST - 1 VIEW  COMPARISON:  02/24/2014  FINDINGS: Cardiomegaly again noted. Status post CABG. Persistent mild interstitial edema bilaterally. Probable small bilateral pleural effusion with bilateral basilar atelectasis or infiltrate. Dual lead cardiac pacemaker is  unchanged in position. Stable left arm PICC line position with tip in distal SVC.  IMPRESSION: Persistent mild interstitial edema bilaterally. Cardiomegaly again noted. Small bilateral pleural effusion with bilateral basilar atelectasis or infiltrate. Status post CABG. Dual lead cardiac pacemaker is unchanged in position.   Electronically Signed   By: Natasha Mead M.D.   On: 02/26/2014 14:40    CXR 1/14 - pulmonary interstitial edema and small bilateral pleural effusions and persistent LLL atelectasis. No signficiant change since previous CXR.  ASSESSMENT / PLAN:  PULMONARY Acute hypoxic respiratory failure - r/t decompensated OSA, SIRS with demand ischemia +/- volume overload  COPD  Suspect OSA  R/o HCAP  P:   Supplemental O2  bipap prn PRN BD   CARDIOVASCULAR CVL LUE PICC 1/13>> Sepsis HTN  Elevated troponin -- abnormal stress test followed by cardiac cath 1/7 show patent CABG grafts. Was downtrending as of 12/31. P:  abx as below  Hold on further volume for now  Consider f/u echo  Cont isosorbide, clonidine, lopressor, hydralazine, statin   RENAL Acute on CKD (baseline Scr ~1.8) - likely multifactorial r/t SIRS, hypotension, Vanc, IV contrast - FENa 0.3%, prerenal, Cr downtrending from 4.26 to 4.18 Hyponatremia, improved from 125 to 132 Hyperkalemia - resolved P:   Follow chem Renal following  Lasix x 1 given 1/13  GASTROINTESTINAL No active issue  P:   NPO for now, consider diet Miralax daily   HEMATOLOGIC Anemia - hgb stable P:  follow cbc  SQ heparin  Transfuse for hgb <6   INFECTIOUS R groin abscess - MRSA  P:   BCx2 12/29>>> neg  UC 12/29>>> neg  R groin 1/2>>> MRSA   Rifampin 1/8>>> Daptomycin 1/11>>> ID following - dapto to start per pharmacy when vanc level <15. Will be treated with dapto and rifampin for 6 weeks then dapto will be switched to doxy  ENDOCRINE DM  P:   SSI, levemir   NEUROLOGIC AMS/ metabolic encephalopathy r/t SIRS,  hypoxia, hyponatremia - resolved P:   Continue to monitor   FAMILY  - Updates:  No family at bedside 1/14  - Inter-disciplinary family meet or Palliative Care meeting due by:  1/20    Griffin Basil, MD  02/27/2014  7:49 AM  PCCM ATTENDING: I have reviewed pt's initial presentation, consultants notes and hospital database in detail.  The above assessment and plan was formulated under my direction. Discussed with Dr Joseph Art  Transfer back to telemetry. TRH to resume care as of AM 1/15 and PCCM to sign off Billy Fischer, MD;  PCCM service; Mobile [phone]

## 2014-02-27 NOTE — Progress Notes (Signed)
POD#6 gracilis muscle flap to right groin  Elevated Cr- down since yesterday Events noted, transferred to ICU, received one dose Lasix  Temp:  [97.8 F (36.6 C)-98.7 F (37.1 C)] 98.6 F (37 C) (01/14 0717) Pulse Rate:  [67-72] 70 (01/14 0700) Resp:  [13-117] 18 (01/14 0700) BP: (124-185)/(53-100) 124/100 mmHg (01/14 0700) SpO2:  [97 %-100 %] 100 % (01/14 0700) FiO2 (%):  [40 %] 40 % (01/14 0114) Weight:  [100 kg (220 lb 7.4 oz)-100.4 kg (221 lb 5.5 oz)] 100.4 kg (221 lb 5.5 oz) (01/14 0500)   JP 440  PE Much more alert, cooperative with exam and less labored breathing Right thigh dressings changed: unable to express any fluid from incisions, Drain  watery  No cellulitis   A/P Dry dressings to groin and thigh donor site daily High output drains likely in part lymphatic leak given groin and upper thigh area OOB as tolerated  Glenna FellowsBrinda Mckensey Berghuis, MD [facility] Plastic [facility] Re[facility] [phone]

## 2014-02-27 NOTE — Progress Notes (Signed)
Physical Therapy Treatment Patient Details Name: Collin LeitzWilliam F [name] MRN: [id number] DOB: 05/11/1950 Today's Date: 02/27/2014    History of Present Illness Patient is a 64 yo male admitted 02/11/14 with acute hypoxic respiratory failure, sepsis and elevated cardiac enzymes.  CT showed Rt groin abscesses and possible graft infection.  Patient s/p I&D on 02/15/14, and s/p muscle flap on 02/21/14.   PMH:  Aortofemoral BPG, DM, COPD, HTN, ASCVD, CABG, PVD, peripheral neuropathy    PT Comments    Progressing slowly towards physical therapy goals. Able to participate in transfer training from bed with min assist +2 for support, balance training and tolerated standing marches and other therapeutic exercises today. Unwilling to ambulate despite multiple attempts for pt to take several steps, states he does not want to do things his "body cannot handle." Will continue to progress pt's functional mobility as tolerated. Patient will continue to benefit from skilled physical therapy services to further improve independence with functional mobility.   Follow Up Recommendations  SNF;Supervision/Assistance - 24 hour     Equipment Recommendations  3in1 (PT)    Recommendations for Other Services       Precautions / Restrictions Precautions Precautions: Fall Restrictions Weight Bearing Restrictions: No    Mobility  Bed Mobility Overal bed mobility: Needs Assistance Bed Mobility: Supine to Sit;Sit to Supine     Supine to sit: Min assist;HOB elevated Sit to supine: Min guard   General bed mobility comments: Min assist for truncal support into seated position from elevated head of bed position. VC for technique. Able to perform sit to supine transition without physical assist.  Transfers Overall transfer level: Needs assistance Equipment used: Rolling walker (2 wheeled) Transfers: Sit to/from Stand Sit to Stand: +2 physical assistance;Min assist         General transfer comment: Min assist for  boost to stand from lowest bed setting with +2 support. VC for hand placement. Able to perform x3, tolerated standing in progressive increments <1 minute, then approximately 1 minute followed by approximately 3 minutes. Fatigues easily, however no buckling noted.  Ambulation/Gait             General Gait Details: Unwilling to ambulate even a few steps today.   Stairs            Wheelchair Mobility    Modified Rankin (Stroke Patients Only)       Balance               Standing balance comment: Standing balance task, while performing marches with bil UE for support.                    Cognition Arousal/Alertness: Awake/alert Behavior During Therapy: Restless Overall Cognitive Status: Impaired/Different from baseline Area of Impairment: Attention;Following commands;Problem solving   Current Attention Level: Focused   Following Commands: Follows one step commands consistently;Follows one step commands with increased time     Problem Solving: Slow processing;Requires verbal cues;Difficulty sequencing      Exercises General Exercises - Lower Extremity Ankle Circles/Pumps: AAROM;Both;20 reps;Supine Long Arc Quad: Both;10 reps;Seated;Strengthening Heel Slides: AAROM;Both;10 reps;Supine Hip Flexion/Marching: Both;10 reps;Strengthening;Standing    General Comments        Pertinent Vitals/Pain Pain Assessment: No/denies pain  SpO2 98% on room air throughout therapy session HR 70 BP 167/64    Home Living                      Prior Function  PT Goals (current goals can now be found in the care plan section) Acute Rehab PT Goals PT Goal Formulation: With patient/family Time For Goal Achievement: 03/02/14 Potential to Achieve Goals: Fair Progress towards PT goals: Progressing toward goals    Frequency  Min 3X/week    PT Plan Current plan remains appropriate    Co-evaluation             End of Session Equipment  Utilized During Treatment: Gait belt Activity Tolerance: Patient limited by fatigue Patient left: in bed;with call bell/phone within reach;with bed alarm set     Time: 0981-1914 PT Time Calculation (min) (ACUTE ONLY): 32 min  Charges:  $Therapeutic Exercise: 8-22 mins $Therapeutic Activity: 8-22 mins                    G Codes:      [name] 03/16/2014, 5:15 PM [name] [name], [name] [phone]

## 2014-02-27 NOTE — Clinical Social Work Note (Signed)
CSW notes that patient has been transferred to 541M. CSW has provided 541M CSW with handoff report. Patient has a SNF bed at [facility]. [facility]. This CSW signing off at this time.   Roddie McBryant Myer Bohlman MSW, [place]WA, [place], [phone]

## 2014-02-27 NOTE — Progress Notes (Signed)
[facility] ROUNDING NOTE   Subjective:   Interval History: transferred to ICU last night   Objective:  Vital signs in last 24 hours:  Temp:  [97.8 F (36.6 C)-98.7 F (37.1 C)] 98.6 F (37 C) (01/14 0717) Pulse Rate:  [67-72] 70 (01/14 0700) Resp:  [13-117] 18 (01/14 0700) BP: (124-185)/(53-100) 124/100 mmHg (01/14 0700) SpO2:  [97 %-100 %] 100 % (01/14 0700) FiO2 (%):  [40 %] 40 % (01/14 0114) Weight:  [100 kg (220 lb 7.4 oz)-100.4 kg (221 lb 5.5 oz)] 100.4 kg (221 lb 5.5 oz) (01/14 0500)  Weight change: 2.3 kg (5 lb 1.1 oz) Filed Weights   02/26/14 0552 02/26/14 1438 02/27/14 0500  Weight: 97.7 kg (215 lb 6.2 oz) 100 kg (220 lb 7.4 oz) 100.4 kg (221 lb 5.5 oz)    Intake/Output: I/O last 3 completed shifts: In: 310 [P.O.:200; I.V.:10; IV Piggyback:100] Out: 2655 [Urine:2015; Drains:640]   Intake/Output this shift:  Total I/O In: -  Out: 150 [Urine:75; Drains:75]  CVS- RRR RS- CTA ABD- BS present soft non-distended EXT- no edema   Basic Metabolic Panel:  Recent Labs Lab 02/24/14 0605 02/25/14 0615 02/26/14 0035 02/26/14 0524 02/27/14 0500  NA 127* 126* 129* 125* 132*  K 5.1 5.1 5.3* 5.1 5.0  CL 94* 96 96 95* 96  CO2 GLUCOSE 152* 124* 160* 136* 101*  BUN 43* 46* 51* 52* 53*  CREATININE 3.88* 4.05* 4.26* 4.26* 4.18*  CALCIUM 8.2* 7.8* 8.2* 8.1* 8.6  MG  --   --   --   --  2.1  PHOS  --   --   --   --  6.3*    Liver Function Tests: No results for input(s): AST, ALT, ALKPHOS, BILITOT, PROT, ALBUMIN in the last 168 hours. No results for input(s): LIPASE, AMYLASE in the last 168 hours.  Recent Labs Lab 02/25/14 1200  AMMONIA 23    CBC:  Recent Labs Lab 02/21/14 0350 02/23/14 0310 02/25/14 0615 02/26/14 0035 02/27/14 0500  WBC 11.0* 10.7* 13.5* 11.7* 12.4*  HGB 8.6* 8.4* 8.2* 8.1* 8.6*  HCT 28.8* 27.5* 26.3* 25.8* 26.5*  MCV 84.2 83.3 82.4 83.8 81.0  PLT 410* 421* 417* 396 471*    Cardiac  Enzymes:  Recent Labs Lab 02/25/14 0615  CKTOTAL 158    BNP: Invalid input(s): POCBNP  CBG:  Recent Labs Lab 02/26/14 1216 02/26/14 1437 02/26/14 1712 02/26/14 2200 02/27/14 0715  GLUCAP 130* 109* 96 100* 105*    Microbiology: Results for orders placed or performed during the hospital encounter of 02/11/14  Blood culture (routine x 2)     Status: None   Collection Time: 02/11/14  2:29 PM  Result Value Ref Range Status   Specimen Description BLOOD RIGHT HAND  Final   Special Requests BOTTLES DRAWN AEROBIC AND ANAEROBIC 4CC  Final   Culture NO GROWTH 5 DAYS  Final   Report Status 02/16/2014 FINAL  Final  Blood culture (routine x 2)     Status: None   Collection Time: 02/11/14  2:33 PM  Result Value Ref Range Status   Specimen Description BLOOD RIGHT ANTECUBITAL  Final   Special Requests BOTTLES DRAWN AEROBIC ONLY 6CC  Final   Culture NO GROWTH 5 DAYS  Final   Report Status 02/16/2014 FINAL  Final  Urine culture     Status: None   Collection Time: 02/11/14  2:56 PM  Result Value Ref Range Status   Specimen  Description URINE, CLEAN CATCH  Final   Special Requests NONE  Final   Colony Count NO GROWTH Performed at [facility]   Final   Culture NO GROWTH Performed at [facility]   Final   Report Status 02/12/2014 FINAL  Final  MRSA PCR Screening     Status: None   Collection Time: 02/11/14  6:40 PM  Result Value Ref Range Status   MRSA by PCR NEGATIVE NEGATIVE Final    Comment:        The GeneXpert MRSA Assay (FDA approved for NASAL specimens only), is one component of a comprehensive MRSA colonization surveillance program. It is not intended to diagnose MRSA infection nor to guide or monitor treatment for MRSA infections.   Wound culture     Status: None   Collection Time: 02/14/14 10:08 AM  Result Value Ref Range Status   Specimen Description WOUND GROIN RIGHT  Final   Special Requests NONE  Final   Gram Stain   Final    NO WBC  SEEN NO SQUAMOUS EPITHELIAL CELLS SEEN NO ORGANISMS SEEN Performed at [facility]    Culture   Final    NO GROWTH 3 DAYS Performed at [facility]    Report Status 02/17/2014 FINAL  Final  Body fluid culture     Status: None   Collection Time: 02/15/14  9:49 AM  Result Value Ref Range Status   Specimen Description FLUID RIGHT DRAINAGE GROIN  Final   Special Requests NONE  Final   Gram Stain   Final    ABUNDANT WBC PRESENT, PREDOMINANTLY PMN RARE GRAM POSITIVE COCCI IN PAIRS Performed at [facility]    Culture   Final    MODERATE METHICILLIN RESISTANT STAPHYLOCOCCUS AUREUS Note: RIFAMPIN AND GENTAMICIN SHOULD NOT BE USED AS SINGLE DRUGS FOR TREATMENT OF STAPH INFECTIONS. This organism DOES NOT demonstrate inducible Clindamycin resistance in vitro. CRITICAL RESULT CALLED TO, READ BACK BY AND VERIFIED WITH: CAROL@8 :58AM ON  02/17/13 BY DANTS Performed at [facility]    Report Status 02/17/2014 FINAL  Final   Organism ID, Bacteria METHICILLIN RESISTANT STAPHYLOCOCCUS AUREUS  Final      Susceptibility   Methicillin resistant staphylococcus aureus - MIC*    CLINDAMYCIN <=0.25 SENSITIVE Sensitive     ERYTHROMYCIN >=8 RESISTANT Resistant     GENTAMICIN <=0.5 SENSITIVE Sensitive     LEVOFLOXACIN >=8 RESISTANT Resistant     OXACILLIN >=4 RESISTANT Resistant     PENICILLIN >=0.5 RESISTANT Resistant     RIFAMPIN <=0.5 SENSITIVE Sensitive     TRIMETH/SULFA >=320 RESISTANT Resistant     VANCOMYCIN 1 SENSITIVE Sensitive     TETRACYCLINE <=1 SENSITIVE Sensitive     * MODERATE METHICILLIN RESISTANT STAPHYLOCOCCUS AUREUS  MRSA PCR Screening     Status: None   Collection Time: 02/21/14  4:11 AM  Result Value Ref Range Status   MRSA by PCR NEGATIVE NEGATIVE Final    Comment:        The GeneXpert MRSA Assay (FDA approved for NASAL specimens only), is one component of a comprehensive MRSA colonization surveillance program. It is not intended to diagnose  MRSA infection nor to guide or monitor treatment for MRSA infections.     Coagulation Studies: No results for input(s): LABPROT, INR in the last 72 hours.  Urinalysis:  Recent Labs  02/25/14 1356 02/26/14 1542  COLORURINE AMBER* AMBER*  LABSPEC 1.019 1.015  PHURINE 5.0 5.0  GLUCOSEU NEGATIVE NEGATIVE  HGBUR  LARGE* LARGE*  BILIRUBINUR MODERATE* MODERATE*  KETONESUR 15* 15*  PROTEINUR 100* 100*  UROBILINOGEN 1.0 1.0  NITRITE NEGATIVE NEGATIVE  LEUKOCYTESUR SMALL* SMALL*      Imaging: Dg Chest Port 1 View  02/27/2014   CLINICAL DATA:  Respiratory failure  EXAM: PORTABLE CHEST - 1 VIEW  COMPARISON:  Portable chest x-ray of February 26, 2014  FINDINGS: The lungs remain mildly hypoinflated. The cardiopericardial silhouette remains enlarged. The retrocardiac region remains dense and the left hemidiaphragm obscured. There is engorgement of central pulmonary vascularity. There are 8 intact sternal wires from previous CABG. The permanent pacemaker is in stable position radiographically.  IMPRESSION: Congestive heart failure with pulmonary interstitial edema and small bilateral pleural effusions and persistent left lower lobe atelectasis. There has not been significant interval change since yesterday's study.   Electronically Signed   By: David  [place]   On: 02/27/2014 07:35   Dg Chest Port 1 View  02/26/2014   CLINICAL DATA:  Shortness of Breath  EXAM: PORTABLE CHEST - 1 VIEW  COMPARISON:  02/24/2014  FINDINGS: Cardiomegaly again noted. Status post CABG. Persistent mild interstitial edema bilaterally. Probable small bilateral pleural effusion with bilateral basilar atelectasis or infiltrate. Dual lead cardiac pacemaker is unchanged in position. Stable left arm PICC line position with tip in distal SVC.  IMPRESSION: Persistent mild interstitial edema bilaterally. Cardiomegaly again noted. Small bilateral pleural effusion with bilateral basilar atelectasis or infiltrate. Status post CABG. Dual  lead cardiac pacemaker is unchanged in position.   Electronically Signed   By: Natasha Mead M.D.   On: 02/26/2014 14:40     Medications:     . antiseptic oral rinse  7 mL Mouth Rinse BID  . aspirin  81 mg Oral Daily  . atorvastatin  80 mg Oral q1800  . cloNIDine  0.1 mg Transdermal Weekly  . heparin subcutaneous  5,000 Units Subcutaneous 3 times per day  . hydrALAZINE  50 mg Oral 3 times per day  . insulin aspart  0-20 Units Subcutaneous TID WC  . insulin aspart  0-5 Units Subcutaneous QHS  . insulin detemir  25 Units Subcutaneous Q2200  . ipratropium-albuterol  3 mL Nebulization Q6H  . isosorbide mononitrate  30 mg Oral Daily  . metoprolol  5 mg Intravenous 4 times per day  . pantoprazole (PROTONIX) IV  40 mg Intravenous Q24H  . polyethylene glycol  17 g Oral Daily  . rifampin (RIFADIN) IVPB  300 mg Intravenous Q12H  . triamcinolone cream  1 application Topical BID   acetaminophen, albuterol, calcium carbonate, hydrALAZINE, ondansetron **OR** ondansetron (ZOFRAN) IV, sodium chloride, sodium chloride   INITIAL PRESENTATION: 64yo male with complex PMH including HTN, DM, OSA, COPD, CAD s/p CABG 2003, PVD s/p previous aortobifemoral bypass. Initially admitted 12/29 with fevers and SOB with elevated troponin. 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 Treated with Vanc and rifampin with plans for 6 weeks abx. Course has been c/b hyponatremia, AKI with worsening renal function and possible volume overload. On 1/13 he was laid flat for PICC placement    Assessment/ Plan:  1.Acute kidney Injury There are multiple reasons that need to be considered. The vancomycin could cause acute interstitial nephritis or acute tubular necrosis and I think that this is quite plausible. The use of IV contrast and catheterization raise the specter of contrast nephropathy or an atheroembolic shower . Creatinine improved today 2. Hyponatremia  Probably secondary to excess  water intake with worsening renal function 3. Hyperkalemia will follow no contributing drugs identified 4. Anemia will follow would not want to use iron with ongoing infection  Will continue to follow renal function , hopefully should slowly improve     LOS: 16 Bertrand Vowels W @TODAY @10 :08 AM

## 2014-02-27 NOTE — Progress Notes (Signed)
ANTIBIOTIC CONSULT NOTE - FOLLOW UP  Pharmacy Consult for Daptomycin Indication: Vascular graft infection  Allergies  Allergen Reactions  . Morphine And Related     Pt felt flushed and was diaphoretic     Patient Measurements: Height:  (165.1 cm) Weight: 221 lb 5.5 oz (100.4 kg) IBW/kg (Calculated) : 61.5   Vital Signs: Temp: 98.6 F (37 C) (01/14 0717) Temp Source: Oral (01/14 0717) BP: 138/63 mmHg (01/14 0800) Pulse Rate: 70 (01/14 0800) Intake/Output from previous day: 01/13 0701 - 01/14 0700 In: 310 [P.O.:200; I.V.:10; IV Piggyback:100] Out: 1655 [Urine:1215; Drains:440] Intake/Output from this shift: Total I/O In: -  Out: 150 [Urine:75; Drains:75]  Labs:  Recent Labs  02/25/14 0615 02/26/14 0035 02/26/14 0524 02/26/14 1542 02/27/14 0500  WBC 13.5* 11.7*  --   --  12.4*  HGB 8.2* 8.1*  --   --  8.6*  PLT 417* 396  --   --  471*  LABCREA  --   --   --  168.83  --   CREATININE 4.05* 4.26* 4.26*  --  4.18*   Estimated Creatinine Clearance: 19.5 mL/min (by C-G formula based on Cr of 4.18).  Recent Labs  02/26/14 0524 02/27/14 0500  VANCORANDOM 24.2 20.5     Microbiology: Recent Results (from the past 720 hour(s))  Blood culture (routine x 2)     Status: None   Collection Time: 02/11/14  2:29 PM  Result Value Ref Range Status   Specimen Description BLOOD RIGHT HAND  Final   Special Requests BOTTLES DRAWN AEROBIC AND ANAEROBIC 4CC  Final   Culture NO GROWTH 5 DAYS  Final   Report Status 02/16/2014 FINAL  Final  Blood culture (routine x 2)     Status: None   Collection Time: 02/11/14  2:33 PM  Result Value Ref Range Status   Specimen Description BLOOD RIGHT ANTECUBITAL  Final   Special Requests BOTTLES DRAWN AEROBIC ONLY 6CC  Final   Culture NO GROWTH 5 DAYS  Final   Report Status 02/16/2014 FINAL  Final  Urine culture     Status: None   Collection Time: 02/11/14  2:56 PM  Result Value Ref Range Status   Specimen Description URINE, CLEAN  CATCH  Final   Special Requests NONE  Final   Colony Count NO GROWTH Performed at [facility]   Final   Culture NO GROWTH Performed at [facility]   Final   Report Status 02/12/2014 FINAL  Final  MRSA PCR Screening     Status: None   Collection Time: 02/11/14  6:40 PM  Result Value Ref Range Status   MRSA by PCR NEGATIVE NEGATIVE Final    Comment:        The GeneXpert MRSA Assay (FDA approved for NASAL specimens only), is one component of a comprehensive MRSA colonization surveillance program. It is not intended to diagnose MRSA infection nor to guide or monitor treatment for MRSA infections.   Wound culture     Status: None   Collection Time: 02/14/14 10:08 AM  Result Value Ref Range Status   Specimen Description WOUND GROIN RIGHT  Final   Special Requests NONE  Final   Gram Stain   Final    NO WBC SEEN NO SQUAMOUS EPITHELIAL CELLS SEEN NO ORGANISMS SEEN Performed at [facility]    Culture   Final    NO GROWTH 3 DAYS Performed at [facility]    Report Status 02/17/2014 FINAL  Final  Body fluid culture     Status: None   Collection Time: 02/15/14  9:49 AM  Result Value Ref Range Status   Specimen Description FLUID RIGHT DRAINAGE GROIN  Final   Special Requests NONE  Final   Gram Stain   Final    ABUNDANT WBC PRESENT, PREDOMINANTLY PMN RARE GRAM POSITIVE COCCI IN PAIRS Performed at [facility]    Culture   Final    MODERATE METHICILLIN RESISTANT STAPHYLOCOCCUS AUREUS Note: RIFAMPIN AND GENTAMICIN SHOULD NOT BE USED AS SINGLE DRUGS FOR TREATMENT OF STAPH INFECTIONS. This organism DOES NOT demonstrate inducible Clindamycin resistance in vitro. CRITICAL RESULT CALLED TO, READ BACK BY AND VERIFIED WITH: CAROL@8 :58AM ON  02/17/13 BY DANTS Performed at [facility]    Report Status 02/17/2014 FINAL  Final   Organism ID, Bacteria METHICILLIN RESISTANT STAPHYLOCOCCUS AUREUS  Final      Susceptibility   Methicillin  resistant staphylococcus aureus - MIC*    CLINDAMYCIN <=0.25 SENSITIVE Sensitive     ERYTHROMYCIN >=8 RESISTANT Resistant     GENTAMICIN <=0.5 SENSITIVE Sensitive     LEVOFLOXACIN >=8 RESISTANT Resistant     OXACILLIN >=4 RESISTANT Resistant     PENICILLIN >=0.5 RESISTANT Resistant     RIFAMPIN <=0.5 SENSITIVE Sensitive     TRIMETH/SULFA >=320 RESISTANT Resistant     VANCOMYCIN 1 SENSITIVE Sensitive     TETRACYCLINE <=1 SENSITIVE Sensitive     * MODERATE METHICILLIN RESISTANT STAPHYLOCOCCUS AUREUS  MRSA PCR Screening     Status: None   Collection Time: 02/21/14  4:11 AM  Result Value Ref Range Status   MRSA by PCR NEGATIVE NEGATIVE Final    Comment:        The GeneXpert MRSA Assay (FDA approved for NASAL specimens only), is one component of a comprehensive MRSA colonization surveillance program. It is not intended to diagnose MRSA infection nor to guide or monitor treatment for MRSA infections.     Anti-infectives    Start     Dose/Rate Route Frequency Ordered Stop   02/26/14 2200  rifampin (RIFADIN) 300 mg in sodium chloride 0.9 % 100 mL IVPB     300 mg200 mL/hr over 30 Minutes Intravenous Every 12 hours 02/26/14 2112     02/21/14 2200  rifampin (RIFADIN) capsule 300 mg  Status:  Discontinued     300 mg Oral Every 12 hours 02/21/14 2015 02/26/14 2112   02/21/14 1049  polymyxin B 500,000 Units, bacitracin 50,000 Units in sodium chloride irrigation 0.9 % 500 mL irrigation  Status:  Discontinued       As needed 02/21/14 1050 02/21/14 1209   02/16/14 2000  ceFEPIme (MAXIPIME) 1 g in dextrose 5 % 50 mL IVPB  Status:  Discontinued     1 g100 mL/hr over 30 Minutes Intravenous Every 12 hours 02/16/14 0938 02/17/14 1357   02/14/14 0730  ceFEPIme (MAXIPIME) 1 g in dextrose 5 % 50 mL IVPB  Status:  Discontinued     1 g100 mL/hr over 30 Minutes Intravenous Every 24 hours 02/13/14 1035 02/16/14 0938   02/12/14 1000  oseltamivir (TAMIFLU) capsule 75 mg  Status:  Discontinued     75 mg  Oral 2 times daily 02/12/14 0743 02/12/14 0747   02/12/14 1000  oseltamivir (TAMIFLU) capsule 30 mg  Status:  Discontinued     30 mg Oral 2 times daily 02/12/14 0749 02/16/14 0826   02/12/14 0600  vancomycin (VANCOCIN) IVPB 750 mg/150 ml premix  Status:  Discontinued     750 mg150 mL/hr over 60 Minutes Intravenous Every 12 hours 02/11/14 2047 02/23/14 1038   02/12/14 0000  ceFEPIme (MAXIPIME) 1 g in dextrose 5 % 50 mL IVPB  Status:  Discontinued     1 g100 mL/hr over 30 Minutes Intravenous Every 8 hours 02/11/14 2047 02/13/14 1035   02/11/14 1545  vancomycin (VANCOCIN) 1,500 mg in sodium chloride 0.9 % 500 mL IVPB     1,500 mg250 mL/hr over 120 Minutes Intravenous  Once 02/11/14 1537 02/11/14 1940   02/11/14 1545  ceFEPIme (MAXIPIME) 2 g in dextrose 5 % 50 mL IVPB     2 g100 mL/hr over 30 Minutes Intravenous  Once 02/11/14 1537 02/11/14 1642      Assessment: 64 yo M presents on 12/29 fever, dyspnea. He has had some chills and cough as well. Ultrasound showed fluid collection of his R groin. 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 Abx day #16 for MRSA in groin wound/R femoral graft infxn. I&D on 1/2. PICC replaced 1/13. Afebrile, WBC 12.4.Scr peaked at 4.26, now 4.18. CrCl ~3115ml/min. VR this am was 20.5. Per ID will start daptomycin therapy once vanc level is < 15.  Goal of Therapy:  Eradication of infection  Plan:  Hold starting daptomycin, most likely start tomorrow Daptomycin plus rifampin treatment for at least 6 weeks Then change over to oral doxycycline and rifampin therapy Monitor renal function, clinical picture  Armandina StammerBATCHELDER,Jasenia Weilbacher J 02/27/2014,11:08 AM

## 2014-02-27 NOTE — Progress Notes (Signed)
   VASCULAR SURGERY ASSESSMENT & PLAN:  * Events of yesterday afternoon noted. Looks much better today.   *  Continues IV Maxipime and Vanciomycin, and po rifampin.   SUBJECTIVE: No specific complaints currently.  PHYSICAL EXAM: Filed Vitals:   02/27/14 0800 02/27/14 1000 02/27/14 1100 02/27/14 1200  BP: 138/63 104/91 128/101 126/110  Pulse: 70 70 70 69  Temp:    98.4 F (36.9 C)  TempSrc:    Oral  Resp: 21 23 14 16   Height:      Weight:      SpO2: 100% 100% 100% 100%   Dressing on right groin is clean and dry. Both feet warm and well perfused.   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

## 2014-02-27 NOTE — Progress Notes (Signed)
[facility]       Subjective: Feels much better sitting a chair smiling   Antibiotics:  Anti-infectives    Start     Dose/Rate Route Frequency Ordered Stop   02/27/14 2200  rifampin (RIFADIN) capsule 300 mg     300 mg Oral Every 12 hours 02/27/14 1512     02/26/14 2200  rifampin (RIFADIN) 300 mg in sodium chloride 0.9 % 100 mL IVPB  Status:  Discontinued     300 mg200 mL/hr over 30 Minutes Intravenous Every 12 hours 02/26/14 2112 02/27/14 1512   02/21/14 2200  rifampin (RIFADIN) capsule 300 mg  Status:  Discontinued     300 mg Oral Every 12 hours 02/21/14 2015 02/26/14 2112   02/21/14 1049  polymyxin B 500,000 Units, bacitracin 50,000 Units in sodium chloride irrigation 0.9 % 500 mL irrigation  Status:  Discontinued       As needed 02/21/14 1050 02/21/14 1209   02/16/14 2000  ceFEPIme (MAXIPIME) 1 g in dextrose 5 % 50 mL IVPB  Status:  Discontinued     1 g100 mL/hr over 30 Minutes Intravenous Every 12 hours 02/16/14 0938 02/17/14 1357   02/14/14 0730  ceFEPIme (MAXIPIME) 1 g in dextrose 5 % 50 mL IVPB  Status:  Discontinued     1 g100 mL/hr over 30 Minutes Intravenous Every 24 hours 02/13/14 1035 02/16/14 0938   02/12/14 1000  oseltamivir (TAMIFLU) capsule 75 mg  Status:  Discontinued     75 mg Oral 2 times daily 02/12/14 0743 02/12/14 0747   02/12/14 1000  oseltamivir (TAMIFLU) capsule 30 mg  Status:  Discontinued     30 mg Oral 2 times daily 02/12/14 0749 02/16/14 0826   02/12/14 0600  vancomycin (VANCOCIN) IVPB 750 mg/150 ml premix  Status:  Discontinued     750 mg150 mL/hr over 60 Minutes Intravenous Every 12 hours 02/11/14 2047 02/23/14 1038   02/12/14 0000  ceFEPIme (MAXIPIME) 1 g in dextrose 5 % 50 mL IVPB  Status:  Discontinued     1 g100 mL/hr over 30 Minutes Intravenous Every 8 hours 02/11/14 2047 02/13/14 1035   02/11/14 1545  vancomycin (VANCOCIN) 1,500 mg in sodium chloride 0.9 % 500 mL IVPB     1,500 mg250 mL/hr over 120 Minutes  Intravenous  Once 02/11/14 1537 02/11/14 1940   02/11/14 1545  ceFEPIme (MAXIPIME) 2 g in dextrose 5 % 50 mL IVPB     2 g100 mL/hr over 30 Minutes Intravenous  Once 02/11/14 1537 02/11/14 1642      Medications: Scheduled Meds: . antiseptic oral rinse  7 mL Mouth Rinse BID  . aspirin  81 mg Oral Daily  . atorvastatin  80 mg Oral q1800  . cloNIDine  0.1 mg Transdermal Weekly  . heparin subcutaneous  5,000 Units Subcutaneous 3 times per day  . hydrALAZINE  50 mg Oral 3 times per day  . insulin aspart  0-20 Units Subcutaneous TID WC  . insulin aspart  0-5 Units Subcutaneous QHS  . insulin detemir  25 Units Subcutaneous Q2200  . ipratropium-albuterol  3 mL Nebulization Q6H  . isosorbide mononitrate  30 mg Oral Daily  . metoprolol  25 mg Oral BID  . polyethylene glycol  17 g Oral Daily  . rifampin  300 mg Oral Q12H  . triamcinolone cream  1 application Topical BID   Continuous Infusions:   PRN Meds:.acetaminophen, albuterol, calcium carbonate, hydrALAZINE, ondansetron **OR** ondansetron (ZOFRAN)  IV, sodium chloride, sodium chloride    Objective: Weight change: 5 lb 1.1 oz (2.3 kg)  Intake/Output Summary (Last 24 hours) at 02/27/14 1613 Last data filed at 02/27/14 1530  Gross per 24 hour  Intake    500 ml  Output   1535 ml  Net  -1035 ml   Blood pressure 155/78, pulse 70, temperature 98.2 F (36.8 C), temperature source Oral, resp. rate 20, height 5\' 5"  (1.651 m), weight 221 lb 5.5 oz (100.4 kg), SpO2 80 %. Temp:  [97.8 F (36.6 C)-98.7 F (37.1 C)] 98.2 F (36.8 C) (01/14 1610) Pulse Rate:  [67-81] 70 (01/14 1300) Resp:  [13-29] 20 (01/14 1500) BP: (104-185)/(53-120) 155/78 mmHg (01/14 1500) SpO2:  [80 %-100 %] 80 % (01/14 1300) FiO2 (%):  [40 %] 40 % (01/14 0114) Weight:  [221 lb 5.5 oz (100.4 kg)] 221 lb 5.5 oz (100.4 kg) (01/14 0500)  Physical Exam: General: alert oriented sitting in a chair smiling in great spirits HEENT: , EOMI Skin GROIN With dressing Neuro:  nonfocal  CBC:  CBC Latest Ref Rng 02/27/2014 02/26/2014 02/25/2014  WBC 4.0 - 10.5 K/uL 12.4(H) 11.7(H) 13.5(H)  Hemoglobin 13.0 - 17.0 g/dL 1.6(X8.6(L) 8.1(L) 8.2(L)  Hematocrit 39.0 - 52.0 % 26.5(L) 25.8(L) 26.3(L)  Platelets 150 - 400 K/uL 471(H) 396 417(H)      BMET  Recent Labs  02/26/14 0524 02/27/14 0500  NA 125* 132*  K 5.1 5.0  CL 95* 96  CO2 21 23  GLUCOSE 136* 101*  BUN 52* 53*  CREATININE 4.26* 4.18*  CALCIUM 8.1* 8.6     Liver Panel  No results for input(s): PROT, ALBUMIN, AST, ALT, ALKPHOS, BILITOT, BILIDIR, IBILI in the last 72 hours.     Sedimentation Rate No results for input(s): ESRSEDRATE in the last 72 hours. C-Reactive Protein No results for input(s): CRP in the last 72 hours.  Micro Results: Recent Results (from the past 720 hour(s))  Blood culture (routine x 2)     Status: None   Collection Time: 02/11/14  2:29 PM  Result Value Ref Range Status   Specimen Description BLOOD RIGHT HAND  Final   Special Requests BOTTLES DRAWN AEROBIC AND ANAEROBIC 4CC  Final   Culture NO GROWTH 5 DAYS  Final   Report Status 02/16/2014 FINAL  Final  Blood culture (routine x 2)     Status: None   Collection Time: 02/11/14  2:33 PM  Result Value Ref Range Status   Specimen Description BLOOD RIGHT ANTECUBITAL  Final   Special Requests BOTTLES DRAWN AEROBIC ONLY 6CC  Final   Culture NO GROWTH 5 DAYS  Final   Report Status 02/16/2014 FINAL  Final  Urine culture     Status: None   Collection Time: 02/11/14  2:56 PM  Result Value Ref Range Status   Specimen Description URINE, CLEAN CATCH  Final   Special Requests NONE  Final   Colony Count NO GROWTH [facility]   Final   Culture NO GROWTH [facility]formed at [facility]   Final   Report Status 02/12/2014 FINAL  Final  MRSA PCR Screening     Status: None   Collection Time: 02/11/14  6:40 PM  Result Value Ref Range Status   MRSA by PCR NEGATIVE NEGATIVE Final    Comment:        The  GeneXpert MRSA Assay (FDA approved for NASAL specimens only), is one component of a comprehensive MRSA colonization surveillance program. It  is not intended to diagnose MRSA infection nor to guide or monitor treatment for MRSA infections.   Wound culture     Status: None   Collection Time: 02/14/14 10:08 AM  Result Value Ref Range Status   Specimen Description WOUND GROIN RIGHT  Final   Special Requests NONE  Final   Gram Stain   Final    NO WBC SEEN NO SQUAMOUS EPITHELIAL CELLS SEEN NO ORGANISMS SEEN Performed at [facility]    Culture   Final    NO GROWTH 3 DAYS Performed at [facility]    Report Status 02/17/2014 FINAL  Final  Body fluid culture     Status: None   Collection Time: 02/15/14  9:49 AM  Result Value Ref Range Status   Specimen Description FLUID RIGHT DRAINAGE GROIN  Final   Special Requests NONE  Final   Gram Stain   Final    ABUNDANT WBC PRESENT, PREDOMINANTLY PMN RARE GRAM POSITIVE COCCI IN PAIRS Performed at [facility]    Culture   Final    MODERATE METHICILLIN RESISTANT STAPHYLOCOCCUS AUREUS Note: RIFAMPIN AND GENTAMICIN SHOULD NOT BE USED AS SINGLE DRUGS FOR TREATMENT OF STAPH INFECTIONS. This organism DOES NOT demonstrate inducible Clindamycin resistance in vitro. CRITICAL RESULT CALLED TO, READ BACK BY AND VERIFIED WITH: CAROL@8 :58AM ON  02/17/13 BY DANTS Performed at [facility]    Report Status 02/17/2014 FINAL  Final   Organism ID, Bacteria METHICILLIN RESISTANT STAPHYLOCOCCUS AUREUS  Final      Susceptibility   Methicillin resistant staphylococcus aureus - MIC*    CLINDAMYCIN <=0.25 SENSITIVE Sensitive     ERYTHROMYCIN >=8 RESISTANT Resistant     GENTAMICIN <=0.5 SENSITIVE Sensitive     LEVOFLOXACIN >=8 RESISTANT Resistant     OXACILLIN >=4 RESISTANT Resistant     PENICILLIN >=0.5 RESISTANT Resistant     RIFAMPIN <=0.5 SENSITIVE Sensitive     TRIMETH/SULFA >=320 RESISTANT Resistant     VANCOMYCIN 1  SENSITIVE Sensitive     TETRACYCLINE <=1 SENSITIVE Sensitive     * MODERATE METHICILLIN RESISTANT STAPHYLOCOCCUS AUREUS  MRSA PCR Screening     Status: None   Collection Time: 02/21/14  4:11 AM  Result Value Ref Range Status   MRSA by PCR NEGATIVE NEGATIVE Final    Comment:        The GeneXpert MRSA Assay (FDA approved for NASAL specimens only), is one component of a comprehensive MRSA colonization surveillance program. It is not intended to diagnose MRSA infection nor to guide or monitor treatment for MRSA infections.     Studies/Results: Dg Chest Port 1 View  02/27/2014   CLINICAL DATA:  Respiratory failure  EXAM: PORTABLE CHEST - 1 VIEW  COMPARISON:  Portable chest x-ray of February 26, 2014  FINDINGS: The lungs remain mildly hypoinflated. The cardiopericardial silhouette remains enlarged. The retrocardiac region remains dense and the left hemidiaphragm obscured. There is engorgement of central pulmonary vascularity. There are 8 intact sternal wires from previous CABG. The permanent pacemaker is in stable position radiographically.  IMPRESSION: Congestive heart failure with pulmonary interstitial edema and small bilateral pleural effusions and persistent left lower lobe atelectasis. There has not been significant interval change since yesterday's study.   Electronically Signed   By: David  [place]   On: 02/27/2014 07:35   Dg Chest Port 1 View  02/26/2014   CLINICAL DATA:  Shortness of Breath  EXAM: PORTABLE CHEST - 1 VIEW  COMPARISON:  02/24/2014  FINDINGS: Cardiomegaly  again noted. Status post CABG. Persistent mild interstitial edema bilaterally. Probable small bilateral pleural effusion with bilateral basilar atelectasis or infiltrate. Dual lead cardiac pacemaker is unchanged in position. Stable left arm PICC line position with tip in distal SVC.  IMPRESSION: Persistent mild interstitial edema bilaterally. Cardiomegaly again noted. Small bilateral pleural effusion with bilateral basilar  atelectasis or infiltrate. Status post CABG. Dual lead cardiac pacemaker is unchanged in position.   Electronically Signed   By: Natasha Mead M.D.   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 male with aortobifemoral grafting in 2007. Last November he underwent right femoral artery cannulation for aortogram followed bythrombectomy of the left femoral limb. Postoperatively he had some scrotal swelling and some difficulty healing his right femoral cannulation site with some thin drainage. He developed sudden onset of fever associated with some pain and swelling in his right groin leading to admission on 02/12/2014. His initial fever was 104.4. He was started on empiric antibiotics. Admission blood cultures were negative. An ultrasound of his right groin showed a fluid collection and he underwent incision and drainage on 02/15/2014 revealing an abscess sitting on top of the right femoral graft. Abscess cultures grew MRSA.He underwent a gracilis muscle flap procedure to close his right groin wound. He now has worsening renal fxn and supratherapeutic vancomycin levels and worsening renal failure.  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 We have changed him over to IV daptomycin. We'll continue rifampin orally as well. Need to keep in mind if he gets an HCAP or VAP with MRSA that dapto will NOT work in lungs  I will plan on treating him for at least 6 weeks with IV therapy likely daptomycin plus oral rifampin.  At that point in time we'll indeed change over to oral therapy likely DOXY and RIFAMPIN (note he is TMPSMX Resistant  #2 Renal failure: appears to be stabilizing will need to continue to have his drugs dosed renally  #3 Respiratory failure: dramatically better overnight after diuresis  I have given the patient's wife my card and will ensure that he follows up in our clinic prior to stopping his IV antibiotics and turned transitioning to oral antibiotics  Otherwise I'll sign off for now please call with further questions    LOS: 16 days   Acey Lav 02/27/2014, 4:13 PM

## 2014-02-28 LAB — CBC
HEMATOCRIT: 25.1 % — AB (ref 39.0–52.0)
HEMOGLOBIN: 7.9 g/dL — AB (ref 13.0–17.0)
MCH: 25.5 pg — AB (ref 26.0–34.0)
MCHC: 31.5 g/dL (ref 30.0–36.0)
MCV: 81 fL (ref 78.0–100.0)
Platelets: 443 10*3/uL — ABNORMAL HIGH (ref 150–400)
RBC: 3.1 MIL/uL — AB (ref 4.22–5.81)
RDW: 14.4 % (ref 11.5–15.5)
WBC: 8.6 10*3/uL (ref 4.0–10.5)

## 2014-02-28 LAB — GLUCOSE, CAPILLARY
Glucose-Capillary: 106 mg/dL — ABNORMAL HIGH (ref 70–99)
Glucose-Capillary: 92 mg/dL (ref 70–99)
Glucose-Capillary: 174 mg/dL — ABNORMAL HIGH (ref 70–99)
Glucose-Capillary: 147 mg/dL — ABNORMAL HIGH (ref 70–99)

## 2014-02-28 LAB — BASIC METABOLIC PANEL
Anion gap: 8 (ref 5–15)
BUN: 58 mg/dL — ABNORMAL HIGH (ref 6–23)
CHLORIDE: 97 meq/L (ref 96–112)
CO2: 24 mmol/L (ref 19–32)
CREATININE: 4.01 mg/dL — AB (ref 0.50–1.35)
Calcium: 8.2 mg/dL — ABNORMAL LOW (ref 8.4–10.5)
GFR calc non Af Amer: 14 mL/min — ABNORMAL LOW (ref >90)
GFR, EST AFRICAN AMERICAN: 17 mL/min — AB (ref >90)
Glucose, Bld: 122 mg/dL — ABNORMAL HIGH (ref 70–99)
POTASSIUM: 4.9 mmol/L (ref 3.5–5.1)
Sodium: 129 mmol/L — ABNORMAL LOW (ref 135–145)

## 2014-02-28 LAB — VANCOMYCIN, TROUGH: Vancomycin Tr: 17.4 ug/mL (ref 10.0–20.0)

## 2014-02-28 MED ORDER — SODIUM CHLORIDE 0.9 % IV SOLN
600.0000 mg | INTRAVENOUS | Status: DC
  Filled 2014-02-28: qty 12

## 2014-02-28 MED ORDER — SODIUM CHLORIDE 0.9 % IV SOLN
600.0000 mg | INTRAVENOUS | Status: DC
  Administered 2014-02-28 – 2014-03-02 (×2): 600 mg via INTRAVENOUS
  Filled 2014-02-28 (×5): qty 12

## 2014-02-28 NOTE — Progress Notes (Signed)
[facility] ROUNDING NOTE   Subjective:   Interval History: much improved today   Objective:  Vital signs in last 24 hours:  Temp:  [98.2 F (36.8 C)-98.7 F (37.1 C)] 98.7 F (37.1 C) (01/15 0504) Pulse Rate:  [67-84] 70 (01/15 0504) Resp:  [16-20] 20 (01/15 0504) BP: (126-175)/(55-141) 163/56 mmHg (01/15 0504) SpO2:  [80 %-100 %] 99 % (01/15 0843) Weight:  [97.3 kg (214 lb 8.1 oz)] 97.3 kg (214 lb 8.1 oz) (01/15 0504)  Weight change: -2.7 kg (-5 lb 15.2 oz) Filed Weights   02/26/14 1438 02/27/14 0500 02/28/14 0504  Weight: 100 kg (220 lb 7.4 oz) 100.4 kg (221 lb 5.5 oz) 97.3 kg (214 lb 8.1 oz)    Intake/Output: I/O last 3 completed shifts: In: 500 [P.O.:270; I.V.:30; IV Piggyback:200] Out: 3190 [Urine:1540; Drains:1650]   Intake/Output this shift:  Total I/O In: 10 [I.V.:10] Out: 100 [Drains:100]  CVS- RRR RS- CTA ABD- BS present soft non-distended EXT- no edema   Basic Metabolic Panel:  Recent Labs Lab 02/25/14 0615 02/26/14 0035 02/26/14 0524 02/27/14 0500 02/28/14 0450  NA 126* 129* 125* 132* 129*  K 5.1 5.3* 5.1 5.0 4.9  CL 96 96 95* 96 97  CO2 GLUCOSE 124* 160* 136* 101* 122*  BUN 46* 51* 52* 53* 58*  CREATININE 4.05* 4.26* 4.26* 4.18* 4.01*  CALCIUM 7.8* 8.2* 8.1* 8.6 8.2*  MG  --   --   --  2.1  --   PHOS  --   --   --  6.3*  --     Liver Function Tests: No results for input(s): AST, ALT, ALKPHOS, BILITOT, PROT, ALBUMIN in the last 168 hours. No results for input(s): LIPASE, AMYLASE in the last 168 hours.  Recent Labs Lab 02/25/14 1200  AMMONIA 23    CBC:  Recent Labs Lab 02/23/14 0310 02/25/14 0615 02/26/14 0035 02/27/14 0500 02/28/14 0450  WBC 10.7* 13.5* 11.7* 12.4* 8.6  HGB 8.4* 8.2* 8.1* 8.6* 7.9*  HCT 27.5* 26.3* 25.8* 26.5* 25.1*  MCV 83.3 82.4 83.8 81.0 81.0  PLT 421* 417* 396 471* 443*    Cardiac Enzymes:  Recent Labs Lab 02/25/14 0615  CKTOTAL 158    BNP: Invalid  input(s): POCBNP  CBG:  Recent Labs Lab 02/27/14 1240 02/27/14 1758 02/27/14 2117 02/28/14 0558 02/28/14 1132  GLUCAP 75 167* 148* 106* 92    Microbiology: Results for orders placed or performed during the hospital encounter of 02/11/14  Blood culture (routine x 2)     Status: None   Collection Time: 02/11/14  2:29 PM  Result Value Ref Range Status   Specimen Description BLOOD RIGHT HAND  Final   Special Requests BOTTLES DRAWN AEROBIC AND ANAEROBIC 4CC  Final   Culture NO GROWTH 5 DAYS  Final   Report Status 02/16/2014 FINAL  Final  Blood culture (routine x 2)     Status: None   Collection Time: 02/11/14  2:33 PM  Result Value Ref Range Status   Specimen Description BLOOD RIGHT ANTECUBITAL  Final   Special Requests BOTTLES DRAWN AEROBIC ONLY 6CC  Final   Culture NO GROWTH 5 DAYS  Final   Report Status 02/16/2014 FINAL  Final  Urine culture     Status: None   Collection Time: 02/11/14  2:56 PM  Result Value Ref Range Status   Specimen Description URINE, CLEAN CATCH  Final   Special Requests NONE  Final   Colony Count  NO GROWTH Performed at [facility]   Final   Culture NO GROWTH Performed at [facility]   Final   Report Status 02/12/2014 FINAL  Final  MRSA PCR Screening     Status: None   Collection Time: 02/11/14  6:40 PM  Result Value Ref Range Status   MRSA by PCR NEGATIVE NEGATIVE Final    Comment:        The GeneXpert MRSA Assay (FDA approved for NASAL specimens only), is one component of a comprehensive MRSA colonization surveillance program. It is not intended to diagnose MRSA infection nor to guide or monitor treatment for MRSA infections.   Wound culture     Status: None   Collection Time: 02/14/14 10:08 AM  Result Value Ref Range Status   Specimen Description WOUND GROIN RIGHT  Final   Special Requests NONE  Final   Gram Stain   Final    NO WBC SEEN NO SQUAMOUS EPITHELIAL CELLS SEEN NO ORGANISMS SEEN Performed at [facility]    Culture   Final    NO GROWTH 3 DAYS Performed at [facility]    Report Status 02/17/2014 FINAL  Final  Body fluid culture     Status: None   Collection Time: 02/15/14  9:49 AM  Result Value Ref Range Status   Specimen Description FLUID RIGHT DRAINAGE GROIN  Final   Special Requests NONE  Final   Gram Stain   Final    ABUNDANT WBC PRESENT, PREDOMINANTLY PMN RARE GRAM POSITIVE COCCI IN PAIRS Performed at [facility]    Culture   Final    MODERATE METHICILLIN RESISTANT STAPHYLOCOCCUS AUREUS Note: RIFAMPIN AND GENTAMICIN SHOULD NOT BE USED AS SINGLE DRUGS FOR TREATMENT OF STAPH INFECTIONS. This organism DOES NOT demonstrate inducible Clindamycin resistance in vitro. CRITICAL RESULT CALLED TO, READ BACK BY AND VERIFIED WITH: CAROL@8 :58AM ON  02/17/13 BY DANTS Performed at [facility]    Report Status 02/17/2014 FINAL  Final   Organism ID, Bacteria METHICILLIN RESISTANT STAPHYLOCOCCUS AUREUS  Final      Susceptibility   Methicillin resistant staphylococcus aureus - MIC*    CLINDAMYCIN <=0.25 SENSITIVE Sensitive     ERYTHROMYCIN >=8 RESISTANT Resistant     GENTAMICIN <=0.5 SENSITIVE Sensitive     LEVOFLOXACIN >=8 RESISTANT Resistant     OXACILLIN >=4 RESISTANT Resistant     PENICILLIN >=0.5 RESISTANT Resistant     RIFAMPIN <=0.5 SENSITIVE Sensitive     TRIMETH/SULFA >=320 RESISTANT Resistant     VANCOMYCIN 1 SENSITIVE Sensitive     TETRACYCLINE <=1 SENSITIVE Sensitive     * MODERATE METHICILLIN RESISTANT STAPHYLOCOCCUS AUREUS  MRSA PCR Screening     Status: None   Collection Time: 02/21/14  4:11 AM  Result Value Ref Range Status   MRSA by PCR NEGATIVE NEGATIVE Final    Comment:        The GeneXpert MRSA Assay (FDA approved for NASAL specimens only), is one component of a comprehensive MRSA colonization surveillance program. It is not intended to diagnose MRSA infection nor to guide or monitor treatment for MRSA infections.      Coagulation Studies: No results for input(s): LABPROT, INR in the last 72 hours.  Urinalysis:  Recent Labs  02/25/14 1356 02/26/14 1542  COLORURINE AMBER* AMBER*  LABSPEC 1.019 1.015  PHURINE 5.0 5.0  GLUCOSEU NEGATIVE NEGATIVE  HGBUR LARGE* LARGE*  BILIRUBINUR MODERATE* MODERATE*  KETONESUR 15* 15*  PROTEINUR 100* 100*  UROBILINOGEN 1.0  1.0  NITRITE NEGATIVE NEGATIVE  LEUKOCYTESUR SMALL* SMALL*      Imaging: Dg Chest Port 1 View  02/27/2014   CLINICAL DATA:  Respiratory failure  EXAM: PORTABLE CHEST - 1 VIEW  COMPARISON:  Portable chest x-ray of February 26, 2014  FINDINGS: The lungs remain mildly hypoinflated. The cardiopericardial silhouette remains enlarged. The retrocardiac region remains dense and the left hemidiaphragm obscured. There is engorgement of central pulmonary vascularity. There are 8 intact sternal wires from previous CABG. The permanent pacemaker is in stable position radiographically.  IMPRESSION: Congestive heart failure with pulmonary interstitial edema and small bilateral pleural effusions and persistent left lower lobe atelectasis. There has not been significant interval change since yesterday's study.   Electronically Signed   By: David  [place]   On: 02/27/2014 07:35   Dg Chest Port 1 View  02/26/2014   CLINICAL DATA:  Shortness of Breath  EXAM: PORTABLE CHEST - 1 VIEW  COMPARISON:  02/24/2014  FINDINGS: Cardiomegaly again noted. Status post CABG. Persistent mild interstitial edema bilaterally. Probable small bilateral pleural effusion with bilateral basilar atelectasis or infiltrate. Dual lead cardiac pacemaker is unchanged in position. Stable left arm PICC line position with tip in distal SVC.  IMPRESSION: Persistent mild interstitial edema bilaterally. Cardiomegaly again noted. Small bilateral pleural effusion with bilateral basilar atelectasis or infiltrate. Status post CABG. Dual lead cardiac pacemaker is unchanged in position.   Electronically Signed    By: Natasha MeadLiviu  Pop M.D.   On: 02/26/2014 14:40     Medications:     . antiseptic oral rinse  7 mL Mouth Rinse BID  . aspirin  81 mg Oral Daily  . atorvastatin  80 mg Oral q1800  . cloNIDine  0.1 mg Transdermal Weekly  . heparin subcutaneous  5,000 Units Subcutaneous 3 times per day  . hydrALAZINE  50 mg Oral 3 times per day  . insulin aspart  0-20 Units Subcutaneous TID WC  . insulin aspart  0-5 Units Subcutaneous QHS  . insulin detemir  25 Units Subcutaneous Q2200  . ipratropium-albuterol  3 mL Nebulization Q6H  . isosorbide mononitrate  30 mg Oral Daily  . metoprolol  25 mg Oral BID  . polyethylene glycol  17 g Oral Daily  . rifampin  300 mg Oral Q12H  . triamcinolone cream  1 application Topical BID   acetaminophen, albuterol, calcium carbonate, hydrALAZINE, ondansetron **OR** ondansetron (ZOFRAN) IV, sodium chloride, sodium chloride  Assessment/ Plan:  1.Acute kidney Injury There are multiple reasons that need to be considered. The vancomycin could cause acute interstitial nephritis or acute tubular necrosis and I think that this is quite plausible. The use of IV contrast and catheterization raise the specter of contrast nephropathy or an atheroembolic shower . Creatinine continues to improve 2. Hyponatremia Probably secondary to excess water intake with worsening renal function 3. Hyperkalemia will follow no contributing drugs identified 4. Anemia will follow would not want to use iron with ongoing infection  Will continue to follow renal function , hopefully should continue to slowly improve     LOS: 17 Glenisha Gundry W @TODAY @12 :54 PM

## 2014-02-28 NOTE — Progress Notes (Signed)
Pt stated that he no longer wants to wear or machine. He does not wear at home and will call if he needs something.

## 2014-02-28 NOTE — Progress Notes (Signed)
PATIENT DETAILS Name: [name] Age: 64 y.o. Sex: male Date of Birth: 15-Feb-1950 Admit Date: 02/11/2014 Admitting Physician Wilson Singer, MD AVW:[id number], Kingsley Spittle, MD  Brief narrative 64 year old male with past medical history a recent aorta femoral bypass, who came initially was admitted on 02/11/14 with acute hypoxic respiratory failure, sepsis and elevated cardiac enzymes. Patient was started on empiric antibiotics. CT imaging of the Chest was done in ED showed no PE. He developed acute kidney injury probably due to sepsis, ACE inhibitor and contrast-induced nephropathy which resolved with supportive care. Groin ultrasound showed abscess, vascular surgery consulted recommended a CT scan of the abdomen and pelvis with contrast emergently on 02/14/2014 which showed multiple loculated abscesses and possible infected native graft.He then underwent I&D on 02/15/14, wound culture was positive for MRSA. It was felt that he may have a graft infection, however none was visible on CT scan. Since patient had a history of CAD, it was felt that the patient needed preoperative cardiology evaluation before proceeding with any further surgical procedure. Patient underwent a nuclear stress test that was positive for ischemia, subsequently on 02/20/14 underwent a cardiac catheterization which showed patent grafts but severe triple vessel native disease. Subsequently on 02/21/14, plastic surgery did a gracilis muscle flap to the right groin. Unfortunately, patient redeveloped acute kidney injury which is felt to be multifactorial from supratherapeutic vancomycin level,possible contrast-induced nephropathy (post cath) playing a role. On 1/13, patient became acutely short of breath and developed acute hypoxic respiratory failure requiring ICU transfer and prn BiPAP.  Subjective: No complaints this am.  Assessment/Plan: Acute hypoxic respiratory failure: Multifactorial, secondary to volume overload, COPD  and obstructive sleep apnea. Managed in the intensive care unit with when necessary BiPAP, bronchodilators and IV Lasix. Continue oxygen as tolerated.  Right groin abscess with suspected potential right femoral graft infection: On admission, underwent a groin ultrasound which showed abscess, seen by vascular surgery and underwent incision and drainage. Subsequently seen by plastic surgery and underwent a gracilis muscle flap to the right groin. Blood cultures on 12/29 negative, however culture from the right groin wound showed MRSA. Patient was subsequently seen in consult by infectious disease. He was on empiric broad-spectrum antibiotics with vancomycin and cefepime, unfortunately he developed supratherapeutic vancomycin levels along with acute renal failure. Subsequently vancomycin and cefepime were discontinued. Recommendations from infectious disease are to start daptomycin and rifampin, total duration for 6 weeks. PICC line has been placed. Per infectious disease, after 6 weeks of IV antibiotics, he will likely need doxycycline and rifampin  Acute renal failure: Patient has developed acute renal failure twice this admission, during the first half this hospitalization acute renal failure was felt secondary to sepsis, contrast-induced nephropathy and ACE inhibitor use. This significantly improved with supportive care. During the latter half of his hospital stay, renal failure reoccurred post cardiac catheterization and was felt to be secondary to either contrast-induced nephropathy or supratherapeutic vancomycin levels. Some suspicion for atheroembolic process was also entertained. Nephrology was consulted,spoke with Dr Hyman Hopes today, ecommendations are for supportive care and to watch off Diuretics for now . Thankfully creatinine now down trending.  Minimally elevated troponins: Suspect secondary to demand ischemia in the setting of sepsis, worsening kidney function.Cardiac catheterization done during this  hospitalization showed patent graft. Continue aspirin, statin and beta blockers  Suspected COPD: Continue with bronchodilators, lungs clear.  Hyponatremia: Mild, continue to follow. Limit fluids. Nephrology following.  Anemia: Likely secondary to acute illness, worsening renal  function. No overt evidence of blood loss. Transfuse if hemoglobin less than 7. Monitor CBC periodically.  History of CAD status post CABG: Cardiac catheterization done during this hospitalization showed patent graft. Continue aspirin, statin and beta blockers.  Type 2 diabetes: CBGs currently well controlled, continue Levemir, and SSI and follow CBGs and adjust accordingly.  Hypertension: Blood pressure currently controlled with hydralazine, metoprolol, Imdur and transdermal clonidine  Dyslipidemia: Continue with statins.  Status post permanent pacemaker placement: Monitor in telemetry.  Suspected OSA: Will need outpatient sleep study and possible CPAP placement. Continue with CPAP while inpatient.  Disposition: Remain inpatient-SNF early next week  Antibiotics:  See below   Anti-infectives    Start     Dose/Rate Route Frequency Ordered Stop   02/28/14 1800  DAPTOmycin (CUBICIN) 600 mg in sodium chloride 0.9 % IVPB  Status:  Discontinued     600 mg224 mL/hr over 30 Minutes Intravenous Every 24 hours 02/28/14 1345 02/28/14 1346   02/28/14 1800  DAPTOmycin (CUBICIN) 600 mg in sodium chloride 0.9 % IVPB     600 mg224 mL/hr over 30 Minutes Intravenous Every 48 hours 02/28/14 1346     02/27/14 2200  rifampin (RIFADIN) capsule 300 mg     300 mg Oral Every 12 hours 02/27/14 1512     02/26/14 2200  rifampin (RIFADIN) 300 mg in sodium chloride 0.9 % 100 mL IVPB  Status:  Discontinued     300 mg200 mL/hr over 30 Minutes Intravenous Every 12 hours 02/26/14 2112 02/27/14 1512   02/21/14 2200  rifampin (RIFADIN) capsule 300 mg  Status:  Discontinued     300 mg Oral Every 12 hours 02/21/14 2015 02/26/14 2112    02/21/14 1049  polymyxin B 500,000 Units, bacitracin 50,000 Units in sodium chloride irrigation 0.9 % 500 mL irrigation  Status:  Discontinued       As needed 02/21/14 1050 02/21/14 1209   02/16/14 2000  ceFEPIme (MAXIPIME) 1 g in dextrose 5 % 50 mL IVPB  Status:  Discontinued     1 g100 mL/hr over 30 Minutes Intravenous Every 12 hours 02/16/14 0938 02/17/14 1357   02/14/14 0730  ceFEPIme (MAXIPIME) 1 g in dextrose 5 % 50 mL IVPB  Status:  Discontinued     1 g100 mL/hr over 30 Minutes Intravenous Every 24 hours 02/13/14 1035 02/16/14 0938   02/12/14 1000  oseltamivir (TAMIFLU) capsule 75 mg  Status:  Discontinued     75 mg Oral 2 times daily 02/12/14 0743 02/12/14 0747   02/12/14 1000  oseltamivir (TAMIFLU) capsule 30 mg  Status:  Discontinued     30 mg Oral 2 times daily 02/12/14 0749 02/16/14 0826   02/12/14 0600  vancomycin (VANCOCIN) IVPB 750 mg/150 ml premix  Status:  Discontinued     750 mg150 mL/hr over 60 Minutes Intravenous Every 12 hours 02/11/14 2047 02/23/14 1038   02/12/14 0000  ceFEPIme (MAXIPIME) 1 g in dextrose 5 % 50 mL IVPB  Status:  Discontinued     1 g100 mL/hr over 30 Minutes Intravenous Every 8 hours 02/11/14 2047 02/13/14 1035   02/11/14 1545  vancomycin (VANCOCIN) 1,500 mg in sodium chloride 0.9 % 500 mL IVPB     1,500 mg250 mL/hr over 120 Minutes Intravenous  Once 02/11/14 1537 02/11/14 1940   02/11/14 1545  ceFEPIme (MAXIPIME) 2 g in dextrose 5 % 50 mL IVPB     2 g100 mL/hr over 30 Minutes Intravenous  Once 02/11/14 1537 02/11/14 1642  DVT Prophylaxis:  SQ Heparin  Code Status: Full code  Family Communication Spouse at bedside  Procedures: 1/2>>> incision/ drainage R groin abscess  1/7>>LHC-patent grafts 1/8>>> OR>> Gracilis muscle flap to R groin for closure   CONSULTS:  cardiology, pulmonary/intensive care, ID, nephrology and Plastics  Time spent 40 minutes-which includes 50% of the time with face-to-face with patient/ family and coordinating  care related to the above assessment and plan.  MEDICATIONS: Scheduled Meds: . antiseptic oral rinse  7 mL Mouth Rinse BID  . aspirin  81 mg Oral Daily  . atorvastatin  80 mg Oral q1800  . cloNIDine  0.1 mg Transdermal Weekly  . DAPTOmycin (CUBICIN)  IV  600 mg Intravenous Q48H  . heparin subcutaneous  5,000 Units Subcutaneous 3 times per day  . hydrALAZINE  50 mg Oral 3 times per day  . insulin aspart  0-20 Units Subcutaneous TID WC  . insulin aspart  0-5 Units Subcutaneous QHS  . insulin detemir  25 Units Subcutaneous Q2200  . ipratropium-albuterol  3 mL Nebulization Q6H  . isosorbide mononitrate  30 mg Oral Daily  . metoprolol  25 mg Oral BID  . polyethylene glycol  17 g Oral Daily  . rifampin  300 mg Oral Q12H  . triamcinolone cream  1 application Topical BID   Continuous Infusions:  PRN Meds:.acetaminophen, albuterol, calcium carbonate, hydrALAZINE, ondansetron **OR** ondansetron (ZOFRAN) IV, sodium chloride, sodium chloride    PHYSICAL EXAM: Vital signs in last 24 hours: Filed Vitals:   02/28/14 0504 02/28/14 0843 02/28/14 1300 02/28/14 1356  BP: 163/56  180/69   Pulse: 70  141   Temp: 98.7 F (37.1 C)  98.3 F (36.8 C)   TempSrc: Oral  Oral   Resp: 20  20   Height:      Weight: 97.3 kg (214 lb 8.1 oz)     SpO2: 99% 99% 93% 94%    Weight change: -2.7 kg (-5 lb 15.2 oz) Filed Weights   02/26/14 1438 02/27/14 0500 02/28/14 0504  Weight: 100 kg (220 lb 7.4 oz) 100.4 kg (221 lb 5.5 oz) 97.3 kg (214 lb 8.1 oz)   Body mass index is 35.7 kg/(m^2).   Gen Exam: Awake and alert with clear speech.   Neck: Supple, No JVD.   Chest: B/L Clear.   CVS: S1 S2 Regular, no murmurs.  Abdomen: soft, BS +, non tender, non distended.  Extremities: no edema, lower extremities warm to touch. Neurologic: Non Focal.   Skin: No Rash.   Wounds: N/A.   Intake/Output from previous day:  Intake/Output Summary (Last 24 hours) at 02/28/14 1428 Last data filed at 02/28/14 1021   Gross per 24 hour  Intake     10 ml  Output   1870 ml  Net  -1860 ml     LAB RESULTS: CBC  Recent Labs Lab 02/23/14 0310 02/25/14 0615 02/26/14 0035 02/27/14 0500 02/28/14 0450  WBC 10.7* 13.5* 11.7* 12.4* 8.6  HGB 8.4* 8.2* 8.1* 8.6* 7.9*  HCT 27.5* 26.3* 25.8* 26.5* 25.1*  PLT 421* 417* 396 471* 443*  MCV 83.3 82.4 83.8 81.0 81.0  MCH 25.5* 25.7* 26.3 26.3 25.5*  MCHC 30.5 31.2 31.4 32.5 31.5  RDW 14.3 14.3 14.5 14.5 14.4    Chemistries   Recent Labs Lab 02/25/14 0615 02/26/14 0035 02/26/14 0524 02/27/14 0500 02/28/14 0450  NA 126* 129* 125* 132* 129*  K 5.1 5.3* 5.1 5.0 4.9  CL 96 96 95* 96 97  CO2  24 26 21 23 24   GLUCOSE 124* 160* 136* 101* 122*  BUN 46* 51* 52* 53* 58*  CREATININE 4.05* 4.26* 4.26* 4.18* 4.01*  CALCIUM 7.8* 8.2* 8.1* 8.6 8.2*  MG  --   --   --  2.1  --     CBG:  Recent Labs Lab 02/27/14 1240 02/27/14 1758 02/27/14 2117 02/28/14 0558 02/28/14 1132  GLUCAP 75 167* 148* 106* 92    GFR Estimated Creatinine Clearance: 20 mL/min (by C-G formula based on Cr of 4.01).  Coagulation profile No results for input(s): INR, PROTIME in the last 168 hours.  Cardiac Enzymes No results for input(s): CKMB, TROPONINI, MYOGLOBIN in the last 168 hours.  Invalid input(s): CK  Invalid input(s): POCBNP No results for input(s): DDIMER in the last 72 hours. No results for input(s): HGBA1C in the last 72 hours. No results for input(s): CHOL, HDL, LDLCALC, TRIG, CHOLHDL, LDLDIRECT in the last 72 hours. No results for input(s): TSH, T4TOTAL, T3FREE, THYROIDAB in the last 72 hours.  Invalid input(s): FREET3 No results for input(s): VITAMINB12, FOLATE, FERRITIN, TIBC, IRON, RETICCTPCT in the last 72 hours. No results for input(s): LIPASE, AMYLASE in the last 72 hours.  Urine Studies No results for input(s): UHGB, CRYS in the last 72 hours.  Invalid input(s): UACOL, UAPR, USPG, UPH, UTP, UGL, UKET, UBIL, UNIT, UROB, ULEU, UEPI, UWBC, URBC, UBAC,  CAST, UCOM, BILUA  MICROBIOLOGY: Recent Results (from the past 240 hour(s))  MRSA PCR Screening     Status: None   Collection Time: 02/21/14  4:11 AM  Result Value Ref Range Status   MRSA by PCR NEGATIVE NEGATIVE Final    Comment:        The GeneXpert MRSA Assay (FDA approved for NASAL specimens only), is one component of a comprehensive MRSA colonization surveillance program. It is not intended to diagnose MRSA infection nor to guide or monitor treatment for MRSA infections.     RADIOLOGY STUDIES/RESULTS: Dg Chest 2 View  02/11/2014   CLINICAL DATA:  Fever.  Hypoxia.  EXAM: CHEST  2 VIEW  COMPARISON:  07/15/2005 and 07/10/2005  FINDINGS: Pacemaker in place. Previous median sternotomy. There is slight pulmonary vascular congestion but overall heart size is normal. The hilar structures are prominent, right more than left but I feel this is due to the vascular congestion. No effusions. No acute osseous abnormality.  IMPRESSION: Slight pulmonary vascular congestion.   Electronically Signed   By: Geanie CooleyJim  Maxwell M.D.   On: 02/11/2014 15:53   Ct Head Wo Contrast  02/25/2014   CLINICAL DATA:  64 year old with confusion  EXAM: CT HEAD WITHOUT CONTRAST  TECHNIQUE: Contiguous axial images were obtained from the base of the skull through the vertex without intravenous contrast.  COMPARISON:  12/31/2013  FINDINGS: The gray-white differentiation is preserved.  There is no evidence of an acute infarct.  There is no acute intracranial hemorrhage.  There is no midline shift or mass effect.  There is no extra-axial fluid collection.  Soft tissue opacification is noted in the partially visualized right ethmoid sinuses. The remaining paranasal sinuses and mastoid air cells are aerated.  The orbits are intact.  IMPRESSION: 1. No acute intracranial process. 2. Partially visualized right ethmoid sinus disease.   Electronically Signed   By: Fannie KneeKenneth  Crosby   On: 02/25/2014 11:28   Ct Angio Chest Pe W/cm &/or Wo  Cm  02/11/2014   CLINICAL DATA:  Short of breath and hypoxia.  Chills and fever  EXAM: CT  ANGIOGRAPHY CHEST WITH CONTRAST  TECHNIQUE: Multidetector CT imaging of the chest was performed using the standard protocol during bolus administration of intravenous contrast. Multiplanar CT image reconstructions and MIPs were obtained to evaluate the vascular anatomy.  CONTRAST:  OMNIPAQUE IOHEXOL 350 MG/ML SOLN  COMPARISON:  None.  FINDINGS: Mediastinum: There is moderate cardiac enlargement. The patient is status post median sternotomy and CABG procedure. No pericardial effusion identified. The trachea appears patent and is midline. Normal appearance of the esophagus. Prominent AP window lymph node measures 1.4 cm, image number 35/series 6. The main pulmonary artery appears patent. No lobar or segmental pulmonary artery filling defects to suggest a clinically significant pulmonary embolus.  Lungs/Pleura: No airspace consolidation. Mild lower lobe predominant dependent changes are identified including subsegmental atelectasis.  Upper Abdomen: The visualized portions of the liver and spleen are normal. The adrenal glands are unremarkable. The visualized portions of the gallbladder are also normal.  Musculoskeletal: Mild multi level degenerative disc disease identified within the thoracic spine. There is a curvature of the thoracic spine which is convex towards the right.  Review of the MIP images confirms the above findings.  IMPRESSION: 1. No evidence for acute pulmonary embolus. 2. Cardiac enlargement status post CABG procedure.   Electronically Signed   By: Signa Kell M.D.   On: 02/11/2014 17:21   US Pelvis Limited  02/12/2014   CLINICAL DATA:  Fever. Evaluate for abscess. Palpable abnormality right inguinal region.  EXAM: LIMITED ULTRASOUND OF PELVIS  TECHNIQUE: Limited transabdominal ultrasound examination of the pelvis was performed.  COMPARISON:  None.  FINDINGS: A large 11.4 x 10.0 x 4.6 cm right groin  complex fluid collection is noted. This would be consistent with a abscess. To exclude other pathology including hernia, CT can be obtained if needed.  IMPRESSION: Large 11.4 x 10.0 x 4.6 cm right groin complex fluid collection noted. This would be consistent with an abscess. Further evaluation with CT can be obtained if needed .   Electronically Signed   By: Maisie Fus  Register   On: 02/12/2014 10:13   Nm Myocar Multi W/spect W/wall Motion / Ef  02/19/2014   CLINICAL DATA:  Chest pain.  Prior CABG.  Hypertension.  Smoker.  EXAM: MYOCARDIAL IMAGING WITH SPECT (REST AND PHARMACOLOGIC-STRESS)  GATED LEFT VENTRICULAR WALL MOTION STUDY  LEFT VENTRICULAR EJECTION FRACTION  TECHNIQUE: Standard myocardial SPECT imaging was performed after resting intravenous injection of 10 mCi Tc-59m sestamibi. Subsequently, intravenous infusion of Lexiscan was performed under the supervision of the Cardiology staff. At peak effect of the drug, 30 mCi Tc-32m sestamibi was injected intravenously and standard myocardial SPECT imaging was performed. Quantitative gated imaging was also performed to evaluate left ventricular wall motion, and estimate left ventricular ejection fraction.  COMPARISON:  08/02/2007  FINDINGS: Perfusion: Mild fixed defect involving the inferior wall. Possibly related to diaphragmatic attenuation.  A rest area of decreased activity involves the mid anterior wall, mild. There is an area of mild reversibility, which is medium in size. This involves the apical to mid segment of the anterior and anterior lateral wall.  Wall Motion: Global hypokinesis. More focal hypokinesis to dyskinesis involving the lateral left ventricular wall.  Left Ventricular Ejection Fraction: 44 %  End diastolic volume 130 ml  End systolic volume 73 ml  IMPRESSION: 1. Medium-sized area of mild reversibility involving the anterior and anterior lateral walls. Suspicious for inducible ischemia.  2. Global hypokinesis with lateral wall focal  hypokinesis to dyskinesis.  3. Left ventricular ejection  fraction 44%  4. Intermediate-risk stress test findings*.  *2012 Appropriate Use Criteria for Coronary Revascularization Focused Update: J Am Coll Cardiol. 2012;59(9):857-881. http://content.dementiazones.com.aspx?articleid=[email or web address]   Electronically Signed   By: Jeronimo Greaves M.D.   On: 02/19/2014 16:26   Dg Chest Port 1 View  02/27/2014   CLINICAL DATA:  Respiratory failure  EXAM: PORTABLE CHEST - 1 VIEW  COMPARISON:  Portable chest x-ray of February 26, 2014  FINDINGS: The lungs remain mildly hypoinflated. The cardiopericardial silhouette remains enlarged. The retrocardiac region remains dense and the left hemidiaphragm obscured. There is engorgement of central pulmonary vascularity. There are 8 intact sternal wires from previous CABG. The permanent pacemaker is in stable position radiographically.  IMPRESSION: Congestive heart failure with pulmonary interstitial edema and small bilateral pleural effusions and persistent left lower lobe atelectasis. There has not been significant interval change since yesterday's study.   Electronically Signed   By: David  [place]   On: 02/27/2014 07:35   Dg Chest Port 1 View  02/26/2014   CLINICAL DATA:  Shortness of Breath  EXAM: PORTABLE CHEST - 1 VIEW  COMPARISON:  02/24/2014  FINDINGS: Cardiomegaly again noted. Status post CABG. Persistent mild interstitial edema bilaterally. Probable small bilateral pleural effusion with bilateral basilar atelectasis or infiltrate. Dual lead cardiac pacemaker is unchanged in position. Stable left arm PICC line position with tip in distal SVC.  IMPRESSION: Persistent mild interstitial edema bilaterally. Cardiomegaly again noted. Small bilateral pleural effusion with bilateral basilar atelectasis or infiltrate. Status post CABG. Dual lead cardiac pacemaker is unchanged in position.   Electronically Signed   By: Natasha Mead M.D.   On: 02/26/2014 14:40   Dg Chest Port 1  View  02/24/2014   CLINICAL DATA:  Lethargy and disorientation; history of coronary artery disease and peripheral vascular disease, hypertension, and diabetes  EXAM: PORTABLE CHEST - 1 VIEW  COMPARISON:  Portable chest x-ray dated February 12, 2014  FINDINGS: The lungs are mildly hypoinflated. The interstitial markings are increased bilaterally. The cardiac silhouette is enlarged. The pulmonary vascularity is engorged. The retrocardiac region is dense. The hemidiaphragms are mildly blunted. The permanent pacemaker is in appropriate position radiographically. There are post CABG changes.  IMPRESSION: Congestive heart failure with moderate interstitial edema and small bilateral pleural effusions.   Electronically Signed   By: David  [place]   On: 02/24/2014 08:43   Dg Chest Port 1 View  02/12/2014   CLINICAL DATA:  Status post PICC line placement.  EXAM: PORTABLE CHEST - 1 VIEW  COMPARISON:  February 11, 2014.  FINDINGS: Stable cardiomediastinal silhouette. Sternotomy wires are noted. Stable mild central pulmonary vascular congestion. Right-sided pacemaker is unchanged in position. No pneumothorax or pleural effusion is noted. Interval placement of left-sided PICC line with distal tip overlying expected position of the SVC.  IMPRESSION: Interval placement of left-sided PICC line with distal tip overlying expected position of the SVC.   Electronically Signed   By: Roque Lias M.D.   On: 02/12/2014 14:52   Ct Angio Abd/pel W/ And/or W/o  02/14/2014   CLINICAL DATA:  Complex fluid collection of the right groin and status post prior aortobifemoral bypass grafting in 2007 with prior revision of the mid pole right graft limb  EXAM: CTA ABDOMEN AND PELVIS WITHOUT CONTRAST  TECHNIQUE: Multidetector CT imaging of the abdomen and pelvis was performed using the standard protocol during bolus administration of intravenous contrast. Multiplanar reconstructed images and MIPs were obtained and reviewed to evaluate the  vascular anatomy.  CONTRAST:  80mL OMNIPAQUE IOHEXOL 350 MG/ML SOLN  COMPARISON:  Right groin ultrasound on 02/12/2014  FINDINGS: Complex fluid collection of the right inguinal region present with 3 separate components that likely communicate with 1 another. Superior component is associated with the inferior abdominal wall and measures approximately 5.5 cm in diameter. Surrounding inflammation present. The deep portion of this fluid collection abuts the distal right limb of an aortobifemoral graft without evidence of graft pseudoaneurysm or erosion. No air is identified adjacent to the graft or in the fluid collection  Communicating second pocket of fluid extends towards the lower inguinal region and measures approximately 5 cm in diameter. There is a soft tissue wound just superficial to the second component of loculated fluid. The wound itself does not appear to extend all the way to the level of fluid. Third communicating pocket extends into the right inguinal canal and measures approximately 6 cm in greatest diameter. In the coronal projection, all of the fluid collections at up to roughly 12 cm in maximal diameter. Findings are likely consistent with multi loculated abscess given constellation of CT and ultrasound findings.  The aortobifemoral graft itself shows normal patency. Both native SFA origins appear to likely be occluded chronically. Native abdominal aorta above the graft shows no evidence of aneurysmal disease or significant stenosis. There is approximately 80% narrowing at the origin of the celiac axis. The superior mesenteric artery trunk shows diffuse and significant atherosclerosis. Bilateral renal arteries are heavily calcified proximally.  The liver shows steatosis. There is reflux of contrast into the intrahepatic IVC and hepatic veins consistent with a component of right heart failure. The visualized lung bases show small bilateral pleural effusions.  Rounded hyperdense cortical lesion of the  lateral mid left kidney measures approximately 13 mm in diameter. This represents either a hemorrhagic cyst or enhancing cortical lesion. No abnormalities are seen involving bowel. The bladder appears unremarkable. No bony abnormalities.  Review of the MIP images confirms the above findings.  IMPRESSION: 1. Complex and multi lobulated fluid collection in the right inguinal region with 3 separate components present. These likely communicate with one another and span over a distance of roughly 12 cm. Part of the fluid collection does abut the distal right limb of the aortobifemoral graft. No evidence of gas in the fluid or graft pseudoaneurysm. This most likely represents a complex abscess. Inferior component of fluid extends into the right inguinal canal. 2. Probable underlying right heart failure with small bilateral pleural effusions. 3. 13 mm hyperdense cortical lesion of the mid lateral left renal cortex. This is incompletely evaluated on the CTA study. This could represent a hyperdense/hemorrhagic cyst or enhancing cortical neoplasm. Followup is recommended. The most definitive evaluation would be MRI of the abdomen when the patient is able to tolerate MRI examination.   Electronically Signed   By: Irish Lack M.D.   On: 02/14/2014 15:41    Jeoffrey Massed, MD  [facility] Pager:[phone]  If 7PM-7AM, please contact night-coverage [email or web address] Password [email or web address] 02/28/2014, 2:28 PM   LOS: 17 days

## 2014-02-28 NOTE — Progress Notes (Signed)
   VASCULAR SURGERY ASSESSMENT & PLAN:  * Steady progress.  * He continues antibiotics as outlined by ID.  * Will check back Monday. Call if problems over the weekend.   SUBJECTIVE: No specific complaints.  PHYSICAL EXAM: Filed Vitals:   02/27/14 2256 02/28/14 0124 02/28/14 0504 02/28/14 0843  BP:   163/56   Pulse: 67 84 70   Temp:   98.7 F (37.1 C)   TempSrc:   Oral   Resp: 18 18 20    Height:      Weight:   214 lb 8.1 oz (97.3 kg)   SpO2: 94% 100% 99% 99%   Right groin wound is healing nicely. Both feet are warm and well perfused.  LABS: Lab Results  Component Value Date   WBC 8.6 02/28/2014   HGB 7.9* 02/28/2014   HCT 25.1* 02/28/2014   MCV 81.0 02/28/2014   PLT 443* 02/28/2014   Lab Results  Component Value Date   CREATININE 4.01* 02/28/2014   CBG (last 3)   Recent Labs  02/27/14 1758 02/27/14 2117 02/28/14 0558  GLUCAP 167* 148* 106*    [id number][name] Beeper: [phone] 02/28/2014

## 2014-02-28 NOTE — Progress Notes (Signed)
ANTIBIOTIC CONSULT NOTE - FOLLOW UP  Pharmacy Consult for Daptomycin Indication: Vascular graft infection  Allergies  Allergen Reactions  . Morphine And Related     Pt felt flushed and was diaphoretic     Patient Measurements: Height: 5\' 5"  (165.1 cm) Weight: 214 lb 8.1 oz (97.3 kg) IBW/kg (Calculated) : 61.5   Vital Signs: Temp: 98.7 F (37.1 C) (01/15 0504) Temp Source: Oral (01/15 0504) BP: 163/56 mmHg (01/15 0504) Pulse Rate: 70 (01/15 0504) Intake/Output from previous day: 01/14 0701 - 01/15 0700 In: 390 [P.O.:270; I.V.:20; IV Piggyback:100] Out: 2275 [Urine:625; Drains:1650] Intake/Output from this shift: Total I/O In: 10 [I.V.:10] Out: 100 [Drains:100]  Labs:  Recent Labs  02/26/14 0035 02/26/14 0524 02/26/14 1542 02/27/14 0500 02/28/14 0450  WBC 11.7*  --   --  12.4* 8.6  HGB 8.1*  --   --  8.6* 7.9*  PLT 396  --   --  471* 443*  LABCREA  --   --  168.83  --   --   CREATININE 4.26* 4.26*  --  4.18* 4.01*   Estimated Creatinine Clearance: 20 mL/min (by C-G formula based on Cr of 4.01).  Recent Labs  02/26/14 0524 02/27/14 0500 02/28/14 0450  VANCOTROUGH  --   --  17.4  VANCORANDOM 24.2 20.5  --      Microbiology: Recent Results (from the past 720 hour(s))  Blood culture (routine x 2)     Status: None   Collection Time: 02/11/14  2:29 PM  Result Value Ref Range Status   Specimen Description BLOOD RIGHT HAND  Final   Special Requests BOTTLES DRAWN AEROBIC AND ANAEROBIC 4CC  Final   Culture NO GROWTH 5 DAYS  Final   Report Status 02/16/2014 FINAL  Final  Blood culture (routine x 2)     Status: None   Collection Time: 02/11/14  2:33 PM  Result Value Ref Range Status   Specimen Description BLOOD RIGHT ANTECUBITAL  Final   Special Requests BOTTLES DRAWN AEROBIC ONLY 6CC  Final   Culture NO GROWTH 5 DAYS  Final   Report Status 02/16/2014 FINAL  Final  Urine culture     Status: None   Collection Time: 02/11/14  2:56 PM  Result Value Ref Range  Status   Specimen Description URINE, CLEAN CATCH  Final   Special Requests NONE  Final   Colony Count NO GROWTH [facility]   Final   Culture NO GROWTH [facility] at [facility]   Final   Report Status 02/12/2014 FINAL  Final  MRSA PCR Screening     Status: None   Collection Time: 02/11/14  6:40 PM  Result Value Ref Range Status   MRSA by PCR NEGATIVE NEGATIVE Final    Comment:        The GeneXpert MRSA Assay (FDA approved for NASAL specimens only), is one component of a comprehensive MRSA colonization surveillance program. It is not intended to diagnose MRSA infection nor to guide or monitor treatment for MRSA infections.   Wound culture     Status: None   Collection Time: 02/14/14 10:08 AM  Result Value Ref Range Status   Specimen Description WOUND GROIN RIGHT  Final   Special Requests NONE  Final   Gram Stain   Final    NO WBC SEEN NO SQUAMOUS EPITHELIAL CELLS SEEN NO ORGANISMS SEEN Performed at [facility]    Culture   Final    NO GROWTH 3  DAYS Performed at [facility]    Report Status 02/17/2014 FINAL  Final  Body fluid culture     Status: None   Collection Time: 02/15/14  9:49 AM  Result Value Ref Range Status   Specimen Description FLUID RIGHT DRAINAGE GROIN  Final   Special Requests NONE  Final   Gram Stain   Final    ABUNDANT WBC PRESENT, PREDOMINANTLY PMN RARE GRAM POSITIVE COCCI IN PAIRS Performed at [facility]    Culture   Final    MODERATE METHICILLIN RESISTANT STAPHYLOCOCCUS AUREUS Note: RIFAMPIN AND GENTAMICIN SHOULD NOT BE USED AS SINGLE DRUGS FOR TREATMENT OF STAPH INFECTIONS. This organism DOES NOT demonstrate inducible Clindamycin resistance in vitro. CRITICAL RESULT CALLED TO, READ BACK BY AND VERIFIED WITH: CAROL@8 :58AM ON  02/17/13 BY DANTS Performed at [facility]    Report Status 02/17/2014 FINAL  Final   Organism ID, Bacteria METHICILLIN RESISTANT STAPHYLOCOCCUS AUREUS   Final      Susceptibility   Methicillin resistant staphylococcus aureus - MIC*    CLINDAMYCIN <=0.25 SENSITIVE Sensitive     ERYTHROMYCIN >=8 RESISTANT Resistant     GENTAMICIN <=0.5 SENSITIVE Sensitive     LEVOFLOXACIN >=8 RESISTANT Resistant     OXACILLIN >=4 RESISTANT Resistant     PENICILLIN >=0.5 RESISTANT Resistant     RIFAMPIN <=0.5 SENSITIVE Sensitive     TRIMETH/SULFA >=320 RESISTANT Resistant     VANCOMYCIN 1 SENSITIVE Sensitive     TETRACYCLINE <=1 SENSITIVE Sensitive     * MODERATE METHICILLIN RESISTANT STAPHYLOCOCCUS AUREUS  MRSA PCR Screening     Status: None   Collection Time: 02/21/14  4:11 AM  Result Value Ref Range Status   MRSA by PCR NEGATIVE NEGATIVE Final    Comment:        The GeneXpert MRSA Assay (FDA approved for NASAL specimens only), is one component of a comprehensive MRSA colonization surveillance program. It is not intended to diagnose MRSA infection nor to guide or monitor treatment for MRSA infections.     Anti-infectives    Start     Dose/Rate Route Frequency Ordered Stop   02/27/14 2200  rifampin (RIFADIN) capsule 300 mg     300 mg Oral Every 12 hours 02/27/14 1512     02/26/14 2200  rifampin (RIFADIN) 300 mg in sodium chloride 0.9 % 100 mL IVPB  Status:  Discontinued     300 mg200 mL/hr over 30 Minutes Intravenous Every 12 hours 02/26/14 2112 02/27/14 1512   02/21/14 2200  rifampin (RIFADIN) capsule 300 mg  Status:  Discontinued     300 mg Oral Every 12 hours 02/21/14 2015 02/26/14 2112   02/21/14 1049  polymyxin B 500,000 Units, bacitracin 50,000 Units in sodium chloride irrigation 0.9 % 500 mL irrigation  Status:  Discontinued       As needed 02/21/14 1050 02/21/14 1209   02/16/14 2000  ceFEPIme (MAXIPIME) 1 g in dextrose 5 % 50 mL IVPB  Status:  Discontinued     1 g100 mL/hr over 30 Minutes Intravenous Every 12 hours 02/16/14 0938 02/17/14 1357   02/14/14 0730  ceFEPIme (MAXIPIME) 1 g in dextrose 5 % 50 mL IVPB  Status:  Discontinued      1 g100 mL/hr over 30 Minutes Intravenous Every 24 hours 02/13/14 1035 02/16/14 0938   02/12/14 1000  oseltamivir (TAMIFLU) capsule 75 mg  Status:  Discontinued     75 mg Oral 2 times daily 02/12/14 0743 02/12/14 0747  02/12/14 1000  oseltamivir (TAMIFLU) capsule 30 mg  Status:  Discontinued     30 mg Oral 2 times daily 02/12/14 0749 02/16/14 0826   02/12/14 0600  vancomycin (VANCOCIN) IVPB 750 mg/150 ml premix  Status:  Discontinued     750 mg150 mL/hr over 60 Minutes Intravenous Every 12 hours 02/11/14 2047 02/23/14 1038   02/12/14 0000  ceFEPIme (MAXIPIME) 1 g in dextrose 5 % 50 mL IVPB  Status:  Discontinued     1 g100 mL/hr over 30 Minutes Intravenous Every 8 hours 02/11/14 2047 02/13/14 1035   02/11/14 1545  vancomycin (VANCOCIN) 1,500 mg in sodium chloride 0.9 % 500 mL IVPB     1,500 mg250 mL/hr over 120 Minutes Intravenous  Once 02/11/14 1537 02/11/14 1940   02/11/14 1545  ceFEPIme (MAXIPIME) 2 g in dextrose 5 % 50 mL IVPB     2 g100 mL/hr over 30 Minutes Intravenous  Once 02/11/14 1537 02/11/14 1642      Assessment: 64 yo M presents on 12/29 fever, dyspnea. He has had some chills and cough as well. Ultrasound showed fluid collection of his R groin. 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 I&D on 1/2. PICC replaced 1/13. Afebrile, WBC 8.Scr peaked at 4.26, now 4.0. CrCl ~3ml/min.   Vancomycin levels trending down 24>20>17 this morning. Will go ahead and start daptomycin tonight and levels likely to be <15 by tonight.  Goal of Therapy:  Eradication of infection  Plan:  Daptomycin /kg ( ) IV q48 hours - will need to change to daily when crcl improves to >3ml/min Recommend checking CK weekly while on daptomycin Continue to follow progression and toleration Continue Rifampin  bid  Sheppard Coil PharmD., BCPS Clinical Pharmacist Pager [phone] 02/28/2014 1:47 PM

## 2014-02-28 NOTE — Progress Notes (Signed)
POD#7 gracilis muscle flap to right groin  Out of ICU  Temp:  [98.2 F (36.8 C)-98.7 F (37.1 C)] 98.7 F (37.1 C) (01/15 0504) Pulse Rate:  [67-84] 70 (01/15 0504) Resp:  [14-23] 20 (01/15 0504) BP: (104-175)/(55-141) 163/56 mmHg (01/15 0504) SpO2:  [80 %-100 %] 99 % (01/15 0504) Weight:  [97.3 kg (214 lb 8.1 oz)] 97.3 kg (214 lb 8.1 oz) (01/15 0504)   JP 1650 for 24 hr/ 470 last shift  PE Able to sit side of bed without assist Much more alert, cooperative with exam and less labored breathingRight thigh: incisions intact, some drainage noted from old drain exit site., Drain  serous No cellulitis   A/P Dry dressings to groin and thigh donor site daily High output drain- if the amount recorded is correct, lymphatic leak present and will need to pull drain. Communicated with staff to page me if again high output for shift Staples out appr 10 days post op  Glenna FellowsBrinda Daanish Copes, MD [facility] Plastic & Reconstructive Surgery [phone]

## 2014-03-01 DIAGNOSIS — J42 Unspecified chronic bronchitis: Secondary | ICD-10-CM

## 2014-03-01 LAB — RENAL FUNCTION PANEL
ALBUMIN: 2.2 g/dL — AB (ref 3.5–5.2)
Anion gap: 3 — ABNORMAL LOW (ref 5–15)
BUN: 59 mg/dL — ABNORMAL HIGH (ref 6–23)
CALCIUM: 8.4 mg/dL (ref 8.4–10.5)
CO2: 28 mmol/L (ref 19–32)
Chloride: 99 mEq/L (ref 96–112)
Creatinine, Ser: 3.74 mg/dL — ABNORMAL HIGH (ref 0.50–1.35)
GFR calc Af Amer: 18 mL/min — ABNORMAL LOW (ref >90)
GFR calc non Af Amer: 16 mL/min — ABNORMAL LOW (ref >90)
Glucose, Bld: 87 mg/dL (ref 70–99)
POTASSIUM: 4.8 mmol/L (ref 3.5–5.1)
Phosphorus: 5.5 mg/dL — ABNORMAL HIGH (ref 2.3–4.6)
Sodium: 130 mmol/L — ABNORMAL LOW (ref 135–145)

## 2014-03-01 LAB — GLUCOSE, CAPILLARY
GLUCOSE-CAPILLARY: 89 mg/dL (ref 70–99)
GLUCOSE-CAPILLARY: 92 mg/dL (ref 70–99)
GLUCOSE-CAPILLARY: 162 mg/dL — AB (ref 70–99)
Glucose-Capillary: 112 mg/dL — ABNORMAL HIGH (ref 70–99)

## 2014-03-01 MED ORDER — HYDROMORPHONE HCL 1 MG/ML IJ SOLN: 0.5000 mg | INTRAMUSCULAR | Status: DC | PRN

## 2014-03-01 MED ORDER — TRAMADOL HCL 50 MG PO TABS
50.0000 mg | ORAL_TABLET | Freq: Two times a day (BID) | ORAL | Status: DC | PRN
  Administered 2014-03-01 – 2014-03-03 (×3): 50 mg via ORAL
  Filled 2014-03-01 (×3): qty 1

## 2014-03-01 MED ORDER — OXYCODONE HCL 5 MG PO TABS
5.0000 mg | ORAL_TABLET | Freq: Four times a day (QID) | ORAL | Status: DC | PRN
  Filled 2014-03-01: qty 1

## 2014-03-01 MED ORDER — METOPROLOL TARTRATE 50 MG PO TABS
50.0000 mg | ORAL_TABLET | Freq: Two times a day (BID) | ORAL | Status: DC
  Administered 2014-03-01 – 2014-03-04 (×6): 50 mg via ORAL
  Filled 2014-03-01 (×8): qty 1

## 2014-03-01 NOTE — Progress Notes (Signed)
POD#8 gracilis muscle flap to right groin  Wants walker and WC, states not OOB yesterday  Temp:  [98.1 F (36.7 C)-98.6 F (37 C)] 98.1 F (36.7 C) (01/16 0504) Pulse Rate:  [70-141] 70 (01/16 0504) Resp:  [18-28] 28 (01/16 0504) BP: (156-182)/(48-91) 182/68 mmHg (01/16 0504) SpO2:  [93 %-100 %] 100 % (01/16 0504) Weight:  [97.6 kg (215 lb 2.7 oz)] 97.6 kg (215 lb 2.7 oz) (01/16 0504)   JP 200 for 24 hr/ emptied 50 at bedside this am  PE Incisions dry, no significant edema compared with opposite thigh Drain  watery No cellulitis   A/P Dry dressings to groin and thigh donor site daily  Removed drain- lymphatic leak and counseled pt and wife that the suction on drain will cause this to persist. Rec compression over area and pt agrees to wear TED hose on this side, SCDs while in bed  Staples out appr 10 days post op  Glenna FellowsBrinda Brittny Spangle, MD [facility] Plastic & Reconstructive Surgery [phone]

## 2014-03-01 NOTE — Progress Notes (Addendum)
PATIENT DETAILS Name: [name] Age: 64 y.o. Sex: male Date of Birth: 1950/10/26 Admit Date: 02/11/2014 Admitting Physician Wilson Singer, MD JYN:[id number], Kingsley Spittle, MD  Brief narrative 64 year old male with past medical history a recent aorta femoral bypass, who came initially was admitted on 02/11/14 with acute hypoxic respiratory failure, sepsis and elevated cardiac enzymes. Patient was started on empiric antibiotics. CT imaging of the Chest was done in ED showed no PE. He developed acute kidney injury probably due to sepsis, ACE inhibitor and contrast-induced nephropathy which resolved with supportive care. Groin ultrasound showed abscess, vascular surgery consulted recommended a CT scan of the abdomen and pelvis with contrast emergently on 02/14/2014 which showed multiple loculated abscesses and possible infected native graft.He then underwent I&D on 02/15/14, wound culture was positive for MRSA. It was felt that he may have a graft infection, however none was visible on CT scan. Since patient had a history of CAD, it was felt that the patient needed preoperative cardiology evaluation before proceeding with any further surgical procedure. Patient underwent a nuclear stress test that was positive for ischemia, subsequently on 02/20/14 underwent a cardiac catheterization which showed patent grafts but severe triple vessel native disease. Subsequently on 02/21/14, plastic surgery did a gracilis muscle flap to the right groin. Unfortunately, patient redeveloped acute kidney injury which is felt to be multifactorial from supratherapeutic vancomycin level,possible contrast-induced nephropathy (post cath) playing a role. On 1/13, patient became acutely short of breath and developed acute hypoxic respiratory failure requiring ICU transfer and prn BiPAP.  Subjective: No complaints this am.Spouse at bedside  Assessment/Plan: Acute hypoxic respiratory failure: Multifactorial, secondary to volume  overload, COPD and obstructive sleep apnea. Managed in the intensive care unit with when necessary BiPAP, bronchodilators and IV Lasix. Much improved.Continue oxygen as tolerated.  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 Subsequently seen by plastic surgery and underwent a gracilis muscle flap to the right groin. Blood cultures on 12/29 negative, however culture from the right groin wound showed MRSA. Patient was subsequently seen in consult by infectious disease. He was on empiric broad-spectrum antibiotics with vancomycin and cefepime, unfortunately he developed supratherapeutic vancomycin levels along with acute renal failure. Subsequently vancomycin and cefepime were discontinued. Recommendations from infectious disease are to start daptomycin and rifampin, total duration for 6 weeks. PICC line has been placed. Per infectious disease, after 6 weeks of IV antibiotics, he will likely need doxycycline and rifampin  Acute renal failure: Patient has developed acute renal failure twice this admission, during the first half this hospitalization acute renal failure was felt secondary to sepsis, contrast-induced nephropathy and ACE inhibitor use. This significantly improved with supportive care. During the latter half of his hospital stay, renal failure reoccurred post cardiac catheterization and was felt to be secondary to either contrast-induced nephropathy or supratherapeutic vancomycin levels. Some suspicion for atheroembolic process was also entertained. Nephrology was consulted,recommendations are for supportive care and to watch off Diuretics for now . Thankfully creatinine now down trending.  Minimally elevated troponins: Suspect secondary to  demand ischemia in the setting of sepsis, worsening kidney function.Cardiac catheterization done during this hospitalization showed patent graft. Continue aspirin, statin and beta blockers  COPD: Continue with bronchodilators, lungs clear.  Hyponatremia: Mild, continue to  follow. Limit fluids. Nephrology following.  Anemia: Likely secondary to acute illness, worsening renal function. No overt evidence of blood loss. Transfuse if hemoglobin less than 7. Monitor CBC periodically.  History of CAD status post CABG: Cardiac catheterization done during this hospitalization showed patent graft. Continue aspirin, statin and beta blockers.  Type 2 diabetes: CBGs currently well controlled, continue Levemir, and SSI and follow CBGs and adjust accordingly.  Hypertension: Blood pressure moderately controlled with hydralazine,, Imdur and transdermal clonidine. Increase Metoprolol to 50 mg BID, follow and adjust medications accordingly  Dyslipidemia: Continue with statins.  Status post permanent pacemaker placement: Monitor in telemetry.  Suspected OSA: Will need outpatient sleep study and possible CPAP placement. Continue with CPAP while inpatient.  History of peripheral vascular disease: Status post aortobifemoral bypass in 2007, recent revision on 01/03/14. Now with suspected graft infection. See above.   Disposition: Remain inpatient-SNF early next week  Antibiotics:  See below   Anti-infectives    Start     Dose/Rate Route Frequency Ordered Stop   02/28/14 1800  DAPTOmycin (CUBICIN) 600 mg in sodium chloride 0.9 % IVPB  Status:  Discontinued     600 mg224 mL/hr over 30 Minutes Intravenous Every 24 hours 02/28/14 1345 02/28/14 1346   02/28/14 1800  DAPTOmycin (CUBICIN) 600 mg in sodium chloride 0.9 % IVPB     600 mg224 mL/hr over 30 Minutes Intravenous Every 48 hours 02/28/14 1346     02/27/14 2200  rifampin (RIFADIN) capsule 300 mg     300 mg Oral Every 12 hours 02/27/14 1512     02/26/14  2200  rifampin (RIFADIN) 300 mg in sodium chloride 0.9 % 100 mL IVPB  Status:  Discontinued     300 mg200 mL/hr over 30 Minutes Intravenous Every 12 hours 02/26/14 2112 02/27/14 1512   02/21/14 2200  rifampin (RIFADIN) capsule 300 mg  Status:  Discontinued     300 mg Oral Every 12 hours 02/21/14 2015 02/26/14 2112   02/21/14 1049  polymyxin B 500,000 Units, bacitracin 50,000 Units in sodium chloride irrigation 0.9 % 500 mL irrigation  Status:  Discontinued       As needed 02/21/14 1050 02/21/14 1209   02/16/14 2000  ceFEPIme (MAXIPIME) 1 g in dextrose 5 % 50 mL IVPB  Status:  Discontinued     1 g100 mL/hr over 30 Minutes Intravenous Every 12 hours 02/16/14 0938 02/17/14 1357   02/14/14 0730  ceFEPIme (MAXIPIME) 1 g in dextrose 5 % 50 mL IVPB  Status:  Discontinued     1 g100 mL/hr over 30 Minutes Intravenous Every 24 hours 02/13/14 1035 02/16/14 0938   02/12/14 1000  oseltamivir (TAMIFLU) capsule 75 mg  Status:  Discontinued     75 mg Oral 2 times daily 02/12/14 0743 02/12/14 0747   02/12/14 1000  oseltamivir (TAMIFLU) capsule 30 mg  Status:  Discontinued     30 mg Oral 2 times daily 02/12/14 0749 02/16/14 0826   02/12/14 0600  vancomycin (VANCOCIN) IVPB 750 mg/150 ml premix  Status:  Discontinued     750 mg150 mL/hr over 60 Minutes Intravenous Every 12 hours 02/11/14 2047 02/23/14 1038   02/12/14 0000  ceFEPIme (MAXIPIME) 1 g in dextrose 5 % 50 mL IVPB  Status:  Discontinued     1 g100 mL/hr over 30 Minutes Intravenous Every 8 hours 02/11/14 2047 02/13/14 1035   02/11/14 1545  vancomycin (VANCOCIN) 1,500 mg in sodium chloride 0.9 % 500 mL IVPB  1,500 mg250 mL/hr over 120 Minutes Intravenous  Once 02/11/14 1537 02/11/14 1940   02/11/14 1545  ceFEPIme (MAXIPIME) 2 g in dextrose 5 % 50 mL IVPB     2 g100 mL/hr over 30 Minutes Intravenous  Once 02/11/14 1537 02/11/14 1642      DVT Prophylaxis:  SQ Heparin  Code Status: Full code  Family Communication Spouse at  bedside  Procedures: 1/2>>> incision/ drainage R groin abscess  1/7>>LHC-patent grafts 1/8>>> OR>> Gracilis muscle flap to R groin for closure   CONSULTS:  cardiology, pulmonary/intensive care, ID, nephrology and Plastics  Time spent 40 minutes-which includes 50% of the time with face-to-face with patient/ family and coordinating care related to the above assessment and plan.  MEDICATIONS: Scheduled Meds: . antiseptic oral rinse  7 mL Mouth Rinse BID  . aspirin  81 mg Oral Daily  . atorvastatin  80 mg Oral q1800  . cloNIDine  0.1 mg Transdermal Weekly  . DAPTOmycin (CUBICIN)  IV  600 mg Intravenous Q48H  . heparin subcutaneous  5,000 Units Subcutaneous 3 times per day  . hydrALAZINE  50 mg Oral 3 times per day  . insulin aspart  0-20 Units Subcutaneous TID WC  . insulin aspart  0-5 Units Subcutaneous QHS  . insulin detemir  25 Units Subcutaneous Q2200  . isosorbide mononitrate  30 mg Oral Daily  . metoprolol  25 mg Oral BID  . polyethylene glycol  17 g Oral Daily  . rifampin  300 mg Oral Q12H  . triamcinolone cream  1 application Topical BID   Continuous Infusions:  PRN Meds:.acetaminophen, albuterol, calcium carbonate, hydrALAZINE, ondansetron **OR** ondansetron (ZOFRAN) IV, sodium chloride, sodium chloride    PHYSICAL EXAM: Vital signs in last 24 hours: Filed Vitals:   02/28/14 2120 03/01/14 0504 03/01/14 1029 03/01/14 1408  BP:  182/68 188/78   Pulse: 72 70 70   Temp:  98.1 F (36.7 C)    TempSrc:  Oral    Resp: 18 28 22    Height:      Weight:  97.6 kg (215 lb 2.7 oz)    SpO2: 97% 100% 100% 97%    Weight change: 0.3 kg (10.6 oz) Filed Weights   02/27/14 0500 02/28/14 0504 03/01/14 0504  Weight: 100.4 kg (221 lb 5.5 oz) 97.3 kg (214 lb 8.1 oz) 97.6 kg (215 lb 2.7 oz)   Body mass index is 35.81 kg/(m^2).   Gen Exam: Awake and alert with clear speech.   Neck: Supple, No JVD.   Chest: B/L Clear.   CVS: S1 S2 Regular, no murmurs.  Abdomen: soft, BS +, non  tender, non distended.  Extremities: no edema, lower extremities warm to touch. Neurologic: Non Focal.   Skin: No Rash.   Wounds: N/A.   Intake/Output from previous day:  Intake/Output Summary (Last 24 hours) at 03/01/14 1419 Last data filed at 03/01/14 0514  Gross per 24 hour  Intake    250 ml  Output    750 ml  Net   -500 ml     LAB RESULTS: CBC  Recent Labs Lab 02/23/14 0310 02/25/14 0615 02/26/14 0035 02/27/14 0500 02/28/14 0450  WBC 10.7* 13.5* 11.7* 12.4* 8.6  HGB 8.4* 8.2* 8.1* 8.6* 7.9*  HCT 27.5* 26.3* 25.8* 26.5* 25.1*  PLT 421* 417* 396 471* 443*  MCV 83.3 82.4 83.8 81.0 81.0  MCH 25.5* 25.7* 26.3 26.3 25.5*  MCHC 30.5 31.2 31.4 32.5 31.5  RDW 14.3 14.3 14.5 14.5 14.4  Chemistries   Recent Labs Lab 02/26/14 0035 02/26/14 0524 02/27/14 0500 02/28/14 0450 03/01/14 0415  NA 129* 125* 132* 129* 130*  K 5.3* 5.1 5.0 4.9 4.8  CL 96 95* 96 97 99  CO2 26 21 23 24 28   GLUCOSE 160* 136* 101* 122* 87  BUN 51* 52* 53* 58* 59*  CREATININE 4.26* 4.26* 4.18* 4.01* 3.74*  CALCIUM 8.2* 8.1* 8.6 8.2* 8.4  MG  --   --  2.1  --   --     CBG:  Recent Labs Lab 02/28/14 1132 02/28/14 1621 02/28/14 2153 03/01/14 0609 03/01/14 1128  GLUCAP 92 174* 147* 112* 89    GFR Estimated Creatinine Clearance: 21.4 mL/min (by C-G formula based on Cr of 3.74).  Coagulation profile No results for input(s): INR, PROTIME in the last 168 hours.  Cardiac Enzymes No results for input(s): CKMB, TROPONINI, MYOGLOBIN in the last 168 hours.  Invalid input(s): CK  Invalid input(s): POCBNP No results for input(s): DDIMER in the last 72 hours. No results for input(s): HGBA1C in the last 72 hours. No results for input(s): CHOL, HDL, LDLCALC, TRIG, CHOLHDL, LDLDIRECT in the last 72 hours. No results for input(s): TSH, T4TOTAL, T3FREE, THYROIDAB in the last 72 hours.  Invalid input(s): FREET3 No results for input(s): VITAMINB12, FOLATE, FERRITIN, TIBC, IRON, RETICCTPCT  in the last 72 hours. No results for input(s): LIPASE, AMYLASE in the last 72 hours.  Urine Studies No results for input(s): UHGB, CRYS in the last 72 hours.  Invalid input(s): UACOL, UAPR, USPG, UPH, UTP, UGL, UKET, UBIL, UNIT, UROB, ULEU, UEPI, UWBC, URBC, UBAC, CAST, UCOM, BILUA  MICROBIOLOGY: Recent Results (from the past 240 hour(s))  MRSA PCR Screening     Status: None   Collection Time: 02/21/14  4:11 AM  Result Value Ref Range Status   MRSA by PCR NEGATIVE NEGATIVE Final    Comment:        The GeneXpert MRSA Assay (FDA approved for NASAL specimens only), is one component of a comprehensive MRSA colonization surveillance program. It is not intended to diagnose MRSA infection nor to guide or monitor treatment for MRSA infections.     RADIOLOGY STUDIES/RESULTS: Dg Chest 2 View  02/11/2014   CLINICAL DATA:  Fever.  Hypoxia.  EXAM: CHEST  2 VIEW  COMPARISON:  07/15/2005 and 07/10/2005  FINDINGS: Pacemaker in place. Previous median sternotomy. There is slight pulmonary vascular congestion but overall heart size is normal. The hilar structures are prominent, right more than left but I feel this is due to the vascular congestion. No effusions. No acute osseous abnormality.  IMPRESSION: Slight pulmonary vascular congestion.   Electronically Signed   By: Geanie [name] M.D.   On: 02/11/2014 15:53   Ct Head Wo Contrast  02/25/2014   CLINICAL DATA:  64 year old with confusion  EXAM: CT HEAD WITHOUT CONTRAST  TECHNIQUE: Contiguous axial images were obtained from the base of the skull through the vertex without intravenous contrast.  COMPARISON:  12/31/2013  FINDINGS: The gray-white differentiation is preserved.  There is no evidence of an acute infarct.  There is no acute intracranial hemorrhage.  There is no midline shift or mass effect.  There is no extra-axial fluid collection.  Soft tissue opacification is noted in the partially visualized right ethmoid sinuses. The remaining paranasal  sinuses and mastoid air cells are aerated.  The orbits are intact.  IMPRESSION: 1. No acute intracranial process. 2. Partially visualized right ethmoid sinus disease.   Electronically Signed  By: Fannie Knee   On: 02/25/2014 11:28   Ct Angio Chest Pe W/cm &/or Wo Cm  02/11/2014   CLINICAL DATA:  Short of breath and hypoxia.  Chills and fever  EXAM: CT ANGIOGRAPHY CHEST WITH CONTRAST  TECHNIQUE: Multidetector CT imaging of the chest was performed using the standard protocol during bolus administration of intravenous contrast. Multiplanar CT image reconstructions and MIPs were obtained to evaluate the vascular anatomy.  CONTRAST:  OMNIPAQUE IOHEXOL 350 MG/ML SOLN  COMPARISON:  None.  FINDINGS: Mediastinum: There is moderate cardiac enlargement. The patient is status post median sternotomy and CABG procedure. No pericardial effusion identified. The trachea appears patent and is midline. Normal appearance of the esophagus. Prominent AP window lymph node measures 1.4 cm, image number 35/series 6. The main pulmonary artery appears patent. No lobar or segmental pulmonary artery filling defects to suggest a clinically significant pulmonary embolus.  Lungs/Pleura: No airspace consolidation. Mild lower lobe predominant dependent changes are identified including subsegmental atelectasis.  Upper Abdomen: The visualized portions of the liver and spleen are normal. The adrenal glands are unremarkable. The visualized portions of the gallbladder are also normal.  Musculoskeletal: Mild multi level degenerative disc disease identified within the thoracic spine. There is a curvature of the thoracic spine which is convex towards the right.  Review of the MIP images confirms the above findings.  IMPRESSION: 1. No evidence for acute pulmonary embolus. 2. Cardiac enlargement status post CABG procedure.   Electronically Signed   By: Signa Kell M.D.   On: 02/11/2014 17:21   US Pelvis Limited  02/12/2014   CLINICAL  DATA:  Fever. Evaluate for abscess. Palpable abnormality right inguinal region.  EXAM: LIMITED ULTRASOUND OF PELVIS  TECHNIQUE: Limited transabdominal ultrasound examination of the pelvis was performed.  COMPARISON:  None.  FINDINGS: A large 11.4 x 10.0 x 4.6 cm right groin complex fluid collection is noted. This would be consistent with a abscess. To exclude other pathology including hernia, CT can be obtained if needed.  IMPRESSION: Large 11.4 x 10.0 x 4.6 cm right groin complex fluid collection noted. This would be consistent with an abscess. Further evaluation with CT can be obtained if needed .   Electronically Signed   By: Maisie Fus  Register   On: 02/12/2014 10:13   Nm Myocar Multi W/spect W/wall Motion / Ef  02/19/2014   CLINICAL DATA:  Chest pain.  Prior CABG.  Hypertension.  Smoker.  EXAM: MYOCARDIAL IMAGING WITH SPECT (REST AND PHARMACOLOGIC-STRESS)  GATED LEFT VENTRICULAR WALL MOTION STUDY  LEFT VENTRICULAR EJECTION FRACTION  TECHNIQUE: Standard myocardial SPECT imaging was performed after resting intravenous injection of 10 mCi Tc-46m sestamibi. Subsequently, intravenous infusion of Lexiscan was performed under the supervision of the Cardiology staff. At peak effect of the drug, 30 mCi Tc-50m sestamibi was injected intravenously and standard myocardial SPECT imaging was performed. Quantitative gated imaging was also performed to evaluate left ventricular wall motion, and estimate left ventricular ejection fraction.  COMPARISON:  08/02/2007  FINDINGS: Perfusion: Mild fixed defect involving the inferior wall. Possibly related to diaphragmatic attenuation.  A rest area of decreased activity involves the mid anterior wall, mild. There is an area of mild reversibility, which is medium in size. This involves the apical to mid segment of the anterior and anterior lateral wall.  Wall Motion: Global hypokinesis. More focal hypokinesis to dyskinesis involving the lateral left ventricular wall.  Left Ventricular  Ejection Fraction: 44 %  End diastolic volume 130 ml  End systolic  volume 73 ml  IMPRESSION: 1. Medium-sized area of mild reversibility involving the anterior and anterior lateral walls. Suspicious for inducible ischemia.  2. Global hypokinesis with lateral wall focal hypokinesis to dyskinesis.  3. Left ventricular ejection fraction 44%  4. Intermediate-risk stress test findings*.  *2012 Appropriate Use Criteria for Coronary Revascularization Focused Update: J Am Coll Cardiol. 2012;59(9):857-881. http://content.dementiazones.com.aspx?articleid=[email or web address]   Electronically Signed   By: Jeronimo Greaves M.D.   On: 02/19/2014 16:26   Dg Chest Port 1 View  02/27/2014   CLINICAL DATA:  Respiratory failure  EXAM: PORTABLE CHEST - 1 VIEW  COMPARISON:  Portable chest x-ray of February 26, 2014  FINDINGS: The lungs remain mildly hypoinflated. The cardiopericardial silhouette remains enlarged. The retrocardiac region remains dense and the left hemidiaphragm obscured. There is engorgement of central pulmonary vascularity. There are 8 intact sternal wires from previous CABG. The permanent pacemaker is in stable position radiographically.  IMPRESSION: Congestive heart failure with pulmonary interstitial edema and small bilateral pleural effusions and persistent left lower lobe atelectasis. There has not been significant interval change since yesterday's study.   Electronically Signed   By: David  [place]   On: 02/27/2014 07:35   Dg Chest Port 1 View  02/26/2014   CLINICAL DATA:  Shortness of Breath  EXAM: PORTABLE CHEST - 1 VIEW  COMPARISON:  02/24/2014  FINDINGS: Cardiomegaly again noted. Status post CABG. Persistent mild interstitial edema bilaterally. Probable small bilateral pleural effusion with bilateral basilar atelectasis or infiltrate. Dual lead cardiac pacemaker is unchanged in position. Stable left arm PICC line position with tip in distal SVC.  IMPRESSION: Persistent mild interstitial edema bilaterally. Cardiomegaly  again noted. Small bilateral pleural effusion with bilateral basilar atelectasis or infiltrate. Status post CABG. Dual lead cardiac pacemaker is unchanged in position.   Electronically Signed   By: Natasha Mead M.D.   On: 02/26/2014 14:40   Dg Chest Port 1 View  02/24/2014   CLINICAL DATA:  Lethargy and disorientation; history of coronary artery disease and peripheral vascular disease, hypertension, and diabetes  EXAM: PORTABLE CHEST - 1 VIEW  COMPARISON:  Portable chest x-ray dated February 12, 2014  FINDINGS: The lungs are mildly hypoinflated. The interstitial markings are increased bilaterally. The cardiac silhouette is enlarged. The pulmonary vascularity is engorged. The retrocardiac region is dense. The hemidiaphragms are mildly blunted. The permanent pacemaker is in appropriate position radiographically. There are post CABG changes.  IMPRESSION: Congestive heart failure with moderate interstitial edema and small bilateral pleural effusions.   Electronically Signed   By: David  [place]   On: 02/24/2014 08:43   Dg Chest Port 1 View  02/12/2014   CLINICAL DATA:  Status post PICC line placement.  EXAM: PORTABLE CHEST - 1 VIEW  COMPARISON:  February 11, 2014.  FINDINGS: Stable cardiomediastinal silhouette. Sternotomy wires are noted. Stable mild central pulmonary vascular congestion. Right-sided pacemaker is unchanged in position. No pneumothorax or pleural effusion is noted. Interval placement of left-sided PICC line with distal tip overlying expected position of the SVC.  IMPRESSION: Interval placement of left-sided PICC line with distal tip overlying expected position of the SVC.   Electronically Signed   By: Roque Lias M.D.   On: 02/12/2014 14:52   Ct Angio Abd/pel W/ And/or W/o  02/14/2014   CLINICAL DATA:  Complex fluid collection of the right groin and status post prior aortobifemoral bypass grafting in 2007 with prior revision of the mid pole right graft limb  EXAM: CTA ABDOMEN AND PELVIS WITHOUT  CONTRAST  TECHNIQUE: Multidetector CT imaging of the abdomen and pelvis was performed using the standard protocol during bolus administration of intravenous contrast. Multiplanar reconstructed images and MIPs were obtained and reviewed to evaluate the vascular anatomy.  CONTRAST:  80mL OMNIPAQUE IOHEXOL 350 MG/ML SOLN  COMPARISON:  Right groin ultrasound on 02/12/2014  FINDINGS: Complex fluid collection of the right inguinal region present with 3 separate components that likely communicate with 1 another. Superior component is associated with the inferior abdominal wall and measures approximately 5.5 cm in diameter. Surrounding inflammation present. The deep portion of this fluid collection abuts the distal right limb of an aortobifemoral graft without evidence of graft pseudoaneurysm or erosion. No air is identified adjacent to the graft or in the fluid collection  Communicating second pocket of fluid extends towards the lower inguinal region and measures approximately 5 cm in diameter. There is a soft tissue wound just superficial to the second component of loculated fluid. The wound itself does not appear to extend all the way to the level of fluid. Third communicating pocket extends into the right inguinal canal and measures approximately 6 cm in greatest diameter. In the coronal projection, all of the fluid collections at up to roughly 12 cm in maximal diameter. Findings are likely consistent with multi loculated abscess given constellation of CT and ultrasound findings.  The aortobifemoral graft itself shows normal patency. Both native SFA origins appear to likely be occluded chronically. Native abdominal aorta above the graft shows no evidence of aneurysmal disease or significant stenosis. There is approximately 80% narrowing at the origin of the celiac axis. The superior mesenteric artery trunk shows diffuse and significant atherosclerosis. Bilateral renal arteries are heavily calcified proximally.  The  liver shows steatosis. There is reflux of contrast into the intrahepatic IVC and hepatic veins consistent with a component of right heart failure. The visualized lung bases show small bilateral pleural effusions.  Rounded hyperdense cortical lesion of the lateral mid left kidney measures approximately 13 mm in diameter. This represents either a hemorrhagic cyst or enhancing cortical lesion. No abnormalities are seen involving bowel. The bladder appears unremarkable. No bony abnormalities.  Review of the MIP images confirms the above findings.  IMPRESSION: 1. Complex and multi lobulated fluid collection in the right inguinal region with 3 separate components present. These likely communicate with one another and span over a distance of roughly 12 cm. Part of the fluid collection does abut the distal right limb of the aortobifemoral graft. No evidence of gas in the fluid or graft pseudoaneurysm. This most likely represents a complex abscess. Inferior component of fluid extends into the right inguinal canal. 2. Probable underlying right heart failure with small bilateral pleural effusions. 3. 13 mm hyperdense cortical lesion of the mid lateral left renal cortex. This is incompletely evaluated on the CTA study. This could represent a hyperdense/hemorrhagic cyst or enhancing cortical neoplasm. Followup is recommended. The most definitive evaluation would be MRI of the abdomen when the patient is able to tolerate MRI examination.   Electronically Signed   By: Irish Lack M.D.   On: 02/14/2014 15:41    Jeoffrey Massed, MD  [facility] Pager:[phone]  If 7PM-7AM, please contact night-coverage [email or web address] Password [email or web address] 03/01/2014, 2:19 PM   LOS: 18 days

## 2014-03-01 NOTE — Progress Notes (Signed)
Pt states he is unable to take Oxy IR for pain relieve due to recent allergic reaction, pharmacist notified and suggest not to give med due to pt reaction to morphine, MD paged and updated on situation. Darrel HooverWilson,Julez Huseby S 6:37 PM

## 2014-03-01 NOTE — Progress Notes (Signed)
[facility] ROUNDING NOTE   Subjective:   Interval History:  Improved with improved renal function   Objective:  Vital signs in last 24 hours:  Temp:  [98.1 F (36.7 C)-98.6 F (37 C)] 98.1 F (36.7 C) (01/16 0504) Pulse Rate:  [70-141] 70 (01/16 1029) Resp:  [18-28] 22 (01/16 1029) BP: (156-188)/(48-91) 188/78 mmHg (01/16 1029) SpO2:  [93 %-100 %] 100 % (01/16 1029) Weight:  [97.6 kg (215 lb 2.7 oz)] 97.6 kg (215 lb 2.7 oz) (01/16 0504)  Weight change: 0.3 kg (10.6 oz) Filed Weights   02/27/14 0500 02/28/14 0504 03/01/14 0504  Weight: 100.4 kg (221 lb 5.5 oz) 97.3 kg (214 lb 8.1 oz) 97.6 kg (215 lb 2.7 oz)    Intake/Output: I/O last 3 completed shifts: In: 260 [P.O.:240; I.V.:20] Out: 1620 [Urine:950; Drains:670]   Intake/Output this shift:     CVS- RRR RS- CTA ABD- BS present  Distended but unchanged EXT- no edema groin wound   Basic Metabolic Panel:  Recent Labs Lab 02/26/14 0035 02/26/14 0524 02/27/14 0500 02/28/14 0450 03/01/14 0415  NA 129* 125* 132* 129* 130*  K 5.3* 5.1 5.0 4.9 4.8  CL 96 95* 96 97 99  CO2 26 21 23 24 28   GLUCOSE 160* 136* 101* 122* 87  BUN 51* 52* 53* 58* 59*  CREATININE 4.26* 4.26* 4.18* 4.01* 3.74*  CALCIUM 8.2* 8.1* 8.6 8.2* 8.4  MG  --   --  2.1  --   --   PHOS  --   --  6.3*  --  5.5*    Liver Function Tests:  Recent Labs Lab 03/01/14 0415  ALBUMIN 2.2*   No results for input(s): LIPASE, AMYLASE in the last 168 hours.  Recent Labs Lab 02/25/14 1200  AMMONIA 23    CBC:  Recent Labs Lab 02/23/14 0310 02/25/14 0615 02/26/14 0035 02/27/14 0500 02/28/14 0450  WBC 10.7* 13.5* 11.7* 12.4* 8.6  HGB 8.4* 8.2* 8.1* 8.6* 7.9*  HCT 27.5* 26.3* 25.8* 26.5* 25.1*  MCV 83.3 82.4 83.8 81.0 81.0  PLT 421* 417* 396 471* 443*    Cardiac Enzymes:  Recent Labs Lab 02/25/14 0615  CKTOTAL 158    BNP: Invalid input(s): POCBNP  CBG:  Recent Labs Lab 02/28/14 1132 02/28/14 1621  02/28/14 2153 03/01/14 0609 03/01/14 1128  GLUCAP 92 174* 147* 112* 89    Microbiology: Results for orders placed or performed during the hospital encounter of 02/11/14  Blood culture (routine x 2)     Status: None   Collection Time: 02/11/14  2:29 PM  Result Value Ref Range Status   Specimen Description BLOOD RIGHT HAND  Final   Special Requests BOTTLES DRAWN AEROBIC AND ANAEROBIC 4CC  Final   Culture NO GROWTH 5 DAYS  Final   Report Status 02/16/2014 FINAL  Final  Blood culture (routine x 2)     Status: None   Collection Time: 02/11/14  2:33 PM  Result Value Ref Range Status   Specimen Description BLOOD RIGHT ANTECUBITAL  Final   Special Requests BOTTLES DRAWN AEROBIC ONLY 6CC  Final   Culture NO GROWTH 5 DAYS  Final   Report Status 02/16/2014 FINAL  Final  Urine culture     Status: None   Collection Time: 02/11/14  2:56 PM  Result Value Ref Range Status   Specimen Description URINE, CLEAN CATCH  Final   Special Requests NONE  Final   Colony Count NO GROWTH [facility]  Final   Culture NO GROWTH Performed at [facility]   Final   Report Status 02/12/2014 FINAL  Final  MRSA PCR Screening     Status: None   Collection Time: 02/11/14  6:40 PM  Result Value Ref Range Status   MRSA by PCR NEGATIVE NEGATIVE Final    Comment:        The GeneXpert MRSA Assay (FDA approved for NASAL specimens only), is one component of a comprehensive MRSA colonization surveillance program. It is not intended to diagnose MRSA infection nor to guide or monitor treatment for MRSA infections.   Wound culture     Status: None   Collection Time: 02/14/14 10:08 AM  Result Value Ref Range Status   Specimen Description WOUND GROIN RIGHT  Final   Special Requests NONE  Final   Gram Stain   Final    NO WBC SEEN NO SQUAMOUS EPITHELIAL CELLS SEEN NO ORGANISMS SEEN Performed at [facility]    Culture   Final    NO GROWTH 3 DAYS Performed at [facility]    Report Status 02/17/2014 FINAL  Final  Body fluid culture     Status: None   Collection Time: 02/15/14  9:49 AM  Result Value Ref Range Status   Specimen Description FLUID RIGHT DRAINAGE GROIN  Final   Special Requests NONE  Final   Gram Stain   Final    ABUNDANT WBC PRESENT, PREDOMINANTLY PMN RARE GRAM POSITIVE COCCI IN PAIRS Performed at [facility]    Culture   Final    MODERATE METHICILLIN RESISTANT STAPHYLOCOCCUS AUREUS Note: RIFAMPIN AND GENTAMICIN SHOULD NOT BE USED AS SINGLE DRUGS FOR TREATMENT OF STAPH INFECTIONS. This organism DOES NOT demonstrate inducible Clindamycin resistance in vitro. CRITICAL RESULT CALLED TO, READ BACK BY AND VERIFIED WITH: CAROL@8 :58AM ON  02/17/13 BY DANTS Performed at [facility]    Report Status 02/17/2014 FINAL  Final   Organism ID, Bacteria METHICILLIN RESISTANT STAPHYLOCOCCUS AUREUS  Final      Susceptibility   Methicillin resistant staphylococcus aureus - MIC*    CLINDAMYCIN <=0.25 SENSITIVE Sensitive     ERYTHROMYCIN >=8 RESISTANT Resistant     GENTAMICIN <=0.5 SENSITIVE Sensitive     LEVOFLOXACIN >=8 RESISTANT Resistant     OXACILLIN >=4 RESISTANT Resistant     PENICILLIN >=0.5 RESISTANT Resistant     RIFAMPIN <=0.5 SENSITIVE Sensitive     TRIMETH/SULFA >=320 RESISTANT Resistant     VANCOMYCIN 1 SENSITIVE Sensitive     TETRACYCLINE <=1 SENSITIVE Sensitive     * MODERATE METHICILLIN RESISTANT STAPHYLOCOCCUS AUREUS  MRSA PCR Screening     Status: None   Collection Time: 02/21/14  4:11 AM  Result Value Ref Range Status   MRSA by PCR NEGATIVE NEGATIVE Final    Comment:        The GeneXpert MRSA Assay (FDA approved for NASAL specimens only), is one component of a comprehensive MRSA colonization surveillance program. It is not intended to diagnose MRSA infection nor to guide or monitor treatment for MRSA infections.     Coagulation Studies: No results for input(s): LABPROT, INR in the last 72  hours.  Urinalysis:  Recent Labs  02/26/14 1542  COLORURINE AMBER*  LABSPEC 1.015  PHURINE 5.0  GLUCOSEU NEGATIVE  HGBUR LARGE*  BILIRUBINUR MODERATE*  KETONESUR 15*  PROTEINUR 100*  UROBILINOGEN 1.0  NITRITE NEGATIVE  LEUKOCYTESUR SMALL*      Imaging: No results found.   Medications:     .  antiseptic oral rinse  7 mL Mouth Rinse BID  . aspirin  81 mg Oral Daily  . atorvastatin  80 mg Oral q1800  . cloNIDine  0.1 mg Transdermal Weekly  . DAPTOmycin (CUBICIN)  IV  600 mg Intravenous Q48H  . heparin subcutaneous  5,000 Units Subcutaneous 3 times per day  . hydrALAZINE  50 mg Oral 3 times per day  . insulin aspart  0-20 Units Subcutaneous TID WC  . insulin aspart  0-5 Units Subcutaneous QHS  . insulin detemir  25 Units Subcutaneous Q2200  . isosorbide mononitrate  30 mg Oral Daily  . metoprolol  25 mg Oral BID  . polyethylene glycol  17 g Oral Daily  . rifampin  300 mg Oral Q12H  . triamcinolone cream  1 application Topical BID   acetaminophen, albuterol, calcium carbonate, hydrALAZINE, ondansetron **OR** ondansetron (ZOFRAN) IV, sodium chloride, sodium chloride  Assessment/ Plan:  1.Acute kidney Injury There are multiple reasons that need to be considered. The vancomycin could cause acute interstitial nephritis or acute tubular necrosis and I think that this is quite plausible. The use of IV contrast and catheterization raise the specter of contrast nephropathy or an atheroembolic shower . Creatinine improving 2. Hyponatremia Probably secondary to excess water intake with worsening renal function 3. Hyperkalemia will follow no contributing drugs identified 4. Anemia will follow would not want to use iron with ongoing infection   Continue to follow renal function I anticipate that the renal function should continue to improve. Please call if additional help needed  Demarius Archila [phone]   LOS: 18 Miesha Bachmann W  :37 AM

## 2014-03-01 NOTE — Clinical Social Work Note (Signed)
Clinical Social Worker met with pt and pt's wife at bedside to present [facility] bed offer. Pt will dc to [facility] [place] once medically stable.   CSW to follow.    , MSW, LCSWA [phone] 03/01/2014 4:07 PM  

## 2014-03-02 LAB — GLUCOSE, CAPILLARY
GLUCOSE-CAPILLARY: 87 mg/dL (ref 70–99)
GLUCOSE-CAPILLARY: 183 mg/dL — AB (ref 70–99)
GLUCOSE-CAPILLARY: 113 mg/dL — AB (ref 70–99)
Glucose-Capillary: 114 mg/dL — ABNORMAL HIGH (ref 70–99)

## 2014-03-02 LAB — BASIC METABOLIC PANEL
ANION GAP: 4 — AB (ref 5–15)
BUN: 61 mg/dL — ABNORMAL HIGH (ref 6–23)
CO2: 26 mmol/L (ref 19–32)
Calcium: 8.3 mg/dL — ABNORMAL LOW (ref 8.4–10.5)
Chloride: 100 mEq/L (ref 96–112)
Creatinine, Ser: 3.59 mg/dL — ABNORMAL HIGH (ref 0.50–1.35)
GFR calc Af Amer: 19 mL/min — ABNORMAL LOW (ref >90)
GFR calc non Af Amer: 17 mL/min — ABNORMAL LOW (ref >90)
GLUCOSE: 96 mg/dL (ref 70–99)
Potassium: 4.9 mmol/L (ref 3.5–5.1)
Sodium: 130 mmol/L — ABNORMAL LOW (ref 135–145)

## 2014-03-02 MED ORDER — FUROSEMIDE 40 MG PO TABS
40.0000 mg | ORAL_TABLET | Freq: Every day | ORAL | Status: DC
  Administered 2014-03-02 – 2014-03-04 (×3): 40 mg via ORAL
  Filled 2014-03-02 (×3): qty 1

## 2014-03-02 NOTE — Progress Notes (Signed)
POD#9 gracilis muscle flap to right groin   Temp:  [97.6 F (36.4 C)-98.4 F (36.9 C)] 98 F (36.7 C) (01/17 0457) Pulse Rate:  [70] 70 (01/17 0457) Resp:  [20-22] 20 (01/17 0457) BP: (134-188)/(45-78) 159/76 mmHg (01/17 0457) SpO2:  [96 %-100 %] 97 % (01/17 0457) Weight:  [97.523 kg (215 lb)] 97.523 kg (215 lb) (01/17 0457)   Sitting at side of bed, states oob with assist yesterday  PE Incisions dry, no seroma noted No cellulitis   A/P Dry dressings to groin and thigh donor site daily. May remove dressings for shower, all incisions may get wet. Soap ok.  Foley still in- ?out prior to d/c   RecTED hose on this side, SCDs while in bed as likely had lymphatic leak  Staples out tomorrow  Glenna FellowsBrinda Shantoya Geurts, MD [facility] [phone]

## 2014-03-02 NOTE — Progress Notes (Signed)
Pt refusing CPAP at this time.

## 2014-03-02 NOTE — Progress Notes (Addendum)
  [facility] Progress Note  03/02/2014 9:06 AM 9 Days Post-Op  Subjective:  Having some soreness with right groin.   Filed Vitals:   03/02/14 0457  BP: 159/76  Pulse: 70  Temp: 98 F (36.7 C)  Resp: 20    Physical Exam: Right groin dressing dry.  Bilateral feet warm.   Assessment:  64 y.o. male is s/p: gracilis muscle flap to right groin 9 Days Post-Op  Plan: Continuing to make progress.  Right groin dressing dry. No edema. Feet well perfused.  Abx per ID.  Will check on patient periodically.   Maris BergerKimberly Trinh, PA-C [facility] Office: [phone] Pager: [phone] 03/02/2014 9:06 AM  Agree with above. Following peripherally.  Waverly Ferrarihristopher Taavi Hoose, MD, FACS [facility] [phone] 03/02/2014

## 2014-03-02 NOTE — Progress Notes (Signed)
PATIENT DETAILS Name: [name] Age: 64 y.o. Sex: male Date of Birth: 26-Jun-1950 Admit Date: 02/11/2014 Admitting Physician Wilson Singer, MD ZOX:[id number], Kingsley Spittle, MD  Brief narrative 64 year old male with past medical history a recent aorta femoral bypass, who came initially was admitted on 02/11/14 with acute hypoxic respiratory failure, sepsis and elevated cardiac enzymes. Patient was started on empiric antibiotics. CT imaging of the Chest was done in ED showed no PE. He developed acute kidney injury probably due to sepsis, ACE inhibitor and contrast-induced nephropathy which resolved with supportive care. Groin ultrasound showed abscess, vascular surgery consulted recommended a CT scan of the abdomen and pelvis with contrast emergently on 02/14/2014 which showed multiple loculated abscesses and possible infected native graft.He then underwent I&D on 02/15/14, wound culture was positive for MRSA. It was felt that he may have a graft infection, however none was visible on CT scan. Since patient had a history of CAD, it was felt that the patient needed preoperative cardiology evaluation before proceeding with any further surgical procedure. Patient underwent a nuclear stress test that was positive for ischemia, subsequently on 02/20/14 underwent a cardiac catheterization which showed patent grafts but severe triple vessel native disease. Subsequently on 02/21/14, plastic surgery did a gracilis muscle flap to the right groin. Unfortunately, patient redeveloped acute kidney injury which is felt to be multifactorial from supratherapeutic vancomycin level,possible contrast-induced nephropathy (post cath) playing a role. On 1/13, patient became acutely short of breath and developed acute hypoxic respiratory failure requiring ICU transfer and prn BiPAP.  Subjective: No complaints this am.  Assessment/Plan: Acute hypoxic respiratory failure: Multifactorial, secondary to volume overload, COPD  and obstructive sleep apnea. Managed in the intensive care unit with when necessary BiPAP, bronchodilators and IV Lasix. Much improved.Continue oxygen as tolerated.  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 Subsequently seen by plastic surgery and underwent a gracilis muscle flap to the right groin. Blood cultures on 12/29 negative, however culture from the right groin wound showed MRSA. Patient was subsequently seen in consult by infectious disease. He was on empiric broad-spectrum antibiotics with vancomycin and cefepime, unfortunately he developed supratherapeutic vancomycin levels along with acute renal failure. Subsequently vancomycin and cefepime were discontinued. Recommendations from infectious disease are to start daptomycin and rifampin, total duration for 6 weeks. PICC line has been placed. Will need weekly CBC, chemistries and CK to be faxed to the infectious disease clinic. Per infectious disease, after 6 weeks of IV antibiotics, he will likely need doxycycline and rifampin.  Acute renal failure: Patient has developed acute renal failure twice this admission, during the first half this hospitalization acute renal failure was felt secondary to sepsis, contrast-induced nephropathy and ACE inhibitor use. This significantly improved with supportive care. During the latter half of his hospital stay, renal failure reoccurred post cardiac catheterization and was felt to be secondary to either contrast-induced nephropathy or supratherapeutic vancomycin levels. Some suspicion for atheroembolic process was also entertained. Nephrology was consulted,recommendations are for supportive care. Since now developing lower extremity edema will start  Lasix.Creatinine now down trending.  Minimally elevated troponins: Suspect secondary to demand ischemia in the setting of sepsis, worsening kidney function.Cardiac catheterization done during this hospitalization showed patent graft. Continue aspirin, statin and beta blockers  COPD: Continue with bronchodilators, lungs clear.  Hyponatremia: Mild, continue to follow. Limit fluids, now on Lasix. Nephrology following.  Anemia: Likely secondary to acute illness, worsening renal function. No overt evidence of blood loss. Transfuse if hemoglobin less than 7. Monitor CBC periodically.  History of CAD status post CABG: Cardiac catheterization done during this hospitalization showed patent graft. Continue aspirin, statin and beta blockers.  Type 2 diabetes: CBGs currently well controlled, continue Levemir, and SSI and follow CBGs and adjust accordingly.  Hypertension: Blood pressure moderately controlled with hydralazine,, Imdur and transdermal clonidine. Increase Metoprolol to 50 mg BID, follow and adjust medications accordingly  Dyslipidemia: Continue with statins.  Status post permanent pacemaker placement: Monitor in telemetry.  Suspected OSA: Will need outpatient sleep study and possible CPAP placement. Continue with CPAP while inpatient.  History of peripheral vascular disease: Status post aortobifemoral bypass in 2007, recent revision on 01/03/14. Now with suspected graft infection. See above.   Disposition: Remain inpatient-SNF ? 1/18  Antibiotics:  See below   Anti-infectives    Start     Dose/Rate Route Frequency Ordered Stop   02/28/14 1800  DAPTOmycin (CUBICIN) 600 mg in sodium chloride 0.9 % IVPB  Status:  Discontinued     600 mg224 mL/hr over 30 Minutes Intravenous Every 24 hours 02/28/14 1345 02/28/14 1346   02/28/14 1800  DAPTOmycin (CUBICIN) 600 mg in sodium chloride 0.9 % IVPB     600 mg224 mL/hr over 30 Minutes Intravenous Every 48 hours 02/28/14 1346     02/27/14 2200   rifampin (RIFADIN) capsule 300 mg     300 mg Oral Every 12 hours 02/27/14 1512     02/26/14 2200  rifampin (RIFADIN) 300 mg in sodium chloride 0.9 % 100 mL IVPB  Status:  Discontinued     300 mg200 mL/hr over 30 Minutes Intravenous Every 12 hours 02/26/14 2112 02/27/14 1512   02/21/14 2200  rifampin (RIFADIN) capsule 300 mg  Status:  Discontinued     300 mg Oral Every 12 hours 02/21/14 2015 02/26/14 2112   02/21/14 1049  polymyxin B 500,000 Units, bacitracin 50,000 Units in sodium chloride irrigation 0.9 % 500 mL irrigation  Status:  Discontinued       As needed 02/21/14 1050 02/21/14 1209   02/16/14 2000  ceFEPIme (MAXIPIME) 1 g in dextrose 5 % 50 mL IVPB  Status:  Discontinued     1 g100 mL/hr over 30 Minutes Intravenous Every 12 hours 02/16/14 0938 02/17/14 1357   02/14/14 0730  ceFEPIme (MAXIPIME) 1 g in dextrose 5 % 50 mL IVPB  Status:  Discontinued     1 g100 mL/hr over 30 Minutes Intravenous Every 24 hours 02/13/14 1035 02/16/14 0938   02/12/14 1000  oseltamivir (TAMIFLU) capsule 75 mg  Status:  Discontinued     75 mg Oral 2 times daily 02/12/14 0743 02/12/14 0747   02/12/14 1000  oseltamivir (TAMIFLU) capsule 30 mg  Status:  Discontinued     30 mg Oral 2 times daily 02/12/14 0749 02/16/14 0826   02/12/14 0600  vancomycin (VANCOCIN) IVPB 750 mg/150 ml premix  Status:  Discontinued     750 mg150 mL/hr over 60 Minutes Intravenous Every 12 hours 02/11/14 2047 02/23/14 1038   02/12/14 0000  ceFEPIme (MAXIPIME) 1 g in dextrose 5 % 50 mL IVPB  Status:  Discontinued     1 g100 mL/hr over 30 Minutes Intravenous Every 8 hours 02/11/14 2047 02/13/14 1035   02/11/14 1545  vancomycin (VANCOCIN) 1,500  mg in sodium chloride 0.9 % 500 mL IVPB     1,500 mg250 mL/hr over 120 Minutes Intravenous  Once 02/11/14 1537 02/11/14 1940   02/11/14 1545  ceFEPIme (MAXIPIME) 2 g in dextrose 5 % 50 mL IVPB     2 g100 mL/hr over 30 Minutes Intravenous  Once 02/11/14 1537 02/11/14 1642      DVT Prophylaxis:  SQ  Heparin  Code Status: Full code  Family Communication None at bedside  Procedures: 1/2>>> incision/ drainage R groin abscess  1/7>>LHC-patent grafts 1/8>>> OR>> Gracilis muscle flap to R groin for closure   CONSULTS:  cardiology, pulmonary/intensive care, ID, nephrology and Plastics  MEDICATIONS: Scheduled Meds: . antiseptic oral rinse  7 mL Mouth Rinse BID  . aspirin  81 mg Oral Daily  . atorvastatin  80 mg Oral q1800  . cloNIDine  0.1 mg Transdermal Weekly  . DAPTOmycin (CUBICIN)  IV  600 mg Intravenous Q48H  . furosemide  40 mg Oral Daily  . heparin subcutaneous  5,000 Units Subcutaneous 3 times per day  . hydrALAZINE  50 mg Oral 3 times per day  . insulin aspart  0-20 Units Subcutaneous TID WC  . insulin aspart  0-5 Units Subcutaneous QHS  . insulin detemir  25 Units Subcutaneous Q2200  . isosorbide mononitrate  30 mg Oral Daily  . metoprolol  50 mg Oral BID  . polyethylene glycol  17 g Oral Daily  . rifampin  300 mg Oral Q12H  . triamcinolone cream  1 application Topical BID   Continuous Infusions:  PRN Meds:.acetaminophen, albuterol, calcium carbonate, hydrALAZINE, HYDROmorphone (DILAUDID) injection, ondansetron **OR** ondansetron (ZOFRAN) IV, sodium chloride, sodium chloride, traMADol    PHYSICAL EXAM: Vital signs in last 24 hours: Filed Vitals:   03/01/14 1408 03/01/14 1513 03/01/14 2022 03/02/14 0457  BP:  134/45 151/63 159/76  Pulse:  70 70 70  Temp:  97.6 F (36.4 C) 98.4 F (36.9 C) 98 F (36.7 C)  TempSrc:  Oral Oral Oral  Resp:  Height:      Weight:    97.523 kg (215 lb)  SpO2: 97% 96% 98% 97%    Weight change: -0.077 kg (-2.7 oz) Filed Weights   02/28/14 0504 03/01/14 0504 03/02/14 0457  Weight: 97.3 kg (214 lb 8.1 oz) 97.6 kg (215 lb 2.7 oz) 97.523 kg (215 lb)   Body mass index is 35.78 kg/(m^2).   Gen Exam: Awake and alert with clear speech.   Neck: Supple, No JVD.   Chest: B/L Clear.   CVS: S1 S2 Regular, no murmurs.    Abdomen: soft, BS +, non tender, non distended.  Extremities: 1+ edema, lower extremities warm to touch. Neurologic: Non Focal.   Skin: No Rash.   Wounds: N/A.   Intake/Output from previous day:  Intake/Output Summary (Last 24 hours) at 03/02/14 1355 Last data filed at 03/02/14 0900  Gross per 24 hour  Intake    390 ml  Output    575 ml  Net   -185 ml     LAB RESULTS: CBC  Recent Labs Lab 02/25/14 0615 02/26/14 0035 02/27/14 0500 02/28/14 0450  WBC 13.5* 11.7* 12.4* 8.6  HGB 8.2* 8.1* 8.6* 7.9*  HCT 26.3* 25.8* 26.5* 25.1*  PLT 417* 396 471* 443*  MCV 82.4 83.8 81.0 81.0  MCH 25.7* 26.3 26.3 25.5*  MCHC 31.2 31.4 32.5 31.5  RDW 14.3 14.5 14.5 14.4    Chemistries   Recent Labs Lab 02/26/14  1610 02/27/14 0500 02/28/14 0450 03/01/14 0415 03/02/14 0500  NA 125* 132* 129* 130* 130*  K 5.1 5.0 4.9 4.8 4.9  CL 95* 96 97 99 100  CO2 21 23 24 28 26   GLUCOSE 136* 101* 122* 87 96  BUN 52* 53* 58* 59* 61*  CREATININE 4.26* 4.18* 4.01* 3.74* 3.59*  CALCIUM 8.1* 8.6 8.2* 8.4 8.3*  MG  --  2.1  --   --   --     CBG:  Recent Labs Lab 03/01/14 1128 03/01/14 1657 03/01/14 2042 03/02/14 0658 03/02/14 1115  GLUCAP 89 92 162* 87 114*    GFR Estimated Creatinine Clearance: 22.3 mL/min (by C-G formula based on Cr of 3.59).  Coagulation profile No results for input(s): INR, PROTIME in the last 168 hours.  Cardiac Enzymes No results for input(s): CKMB, TROPONINI, MYOGLOBIN in the last 168 hours.  Invalid input(s): CK  Invalid input(s): POCBNP No results for input(s): DDIMER in the last 72 hours. No results for input(s): HGBA1C in the last 72 hours. No results for input(s): CHOL, HDL, LDLCALC, TRIG, CHOLHDL, LDLDIRECT in the last 72 hours. No results for input(s): TSH, T4TOTAL, T3FREE, THYROIDAB in the last 72 hours.  Invalid input(s): FREET3 No results for input(s): VITAMINB12, FOLATE, FERRITIN, TIBC, IRON, RETICCTPCT in the last 72 hours. No results  for input(s): LIPASE, AMYLASE in the last 72 hours.  Urine Studies No results for input(s): UHGB, CRYS in the last 72 hours.  Invalid input(s): UACOL, UAPR, USPG, UPH, UTP, UGL, UKET, UBIL, UNIT, UROB, ULEU, UEPI, UWBC, URBC, UBAC, CAST, UCOM, BILUA  MICROBIOLOGY: Recent Results (from the past 240 hour(s))  MRSA PCR Screening     Status: None   Collection Time: 02/21/14  4:11 AM  Result Value Ref Range Status   MRSA by PCR NEGATIVE NEGATIVE Final    Comment:        The GeneXpert MRSA Assay (FDA approved for NASAL specimens only), is one component of a comprehensive MRSA colonization surveillance program. It is not intended to diagnose MRSA infection nor to guide or monitor treatment for MRSA infections.     RADIOLOGY STUDIES/RESULTS: Dg Chest 2 View  02/11/2014   CLINICAL DATA:  Fever.  Hypoxia.  EXAM: CHEST  2 VIEW  COMPARISON:  07/15/2005 and 07/10/2005  FINDINGS: Pacemaker in place. Previous median sternotomy. There is slight pulmonary vascular congestion but overall heart size is normal. The hilar structures are prominent, right more than left but I feel this is due to the vascular congestion. No effusions. No acute osseous abnormality.  IMPRESSION: Slight pulmonary vascular congestion.   Electronically Signed   By: Geanie [name] M.D.   On: 02/11/2014 15:53   Ct Head Wo Contrast  02/25/2014   CLINICAL DATA:  64 year old with confusion  EXAM: CT HEAD WITHOUT CONTRAST  TECHNIQUE: Contiguous axial images were obtained from the base of the skull through the vertex without intravenous contrast.  COMPARISON:  12/31/2013  FINDINGS: The gray-white differentiation is preserved.  There is no evidence of an acute infarct.  There is no acute intracranial hemorrhage.  There is no midline shift or mass effect.  There is no extra-axial fluid collection.  Soft tissue opacification is noted in the partially visualized right ethmoid sinuses. The remaining paranasal sinuses and mastoid air cells are  aerated.  The orbits are intact.  IMPRESSION: 1. No acute intracranial process. 2. Partially visualized right ethmoid sinus disease.   Electronically Signed   By: Fannie Knee  On: 02/25/2014 11:28   Ct Angio Chest Pe W/cm &/or Wo Cm  02/11/2014   CLINICAL DATA:  Short of breath and hypoxia.  Chills and fever  EXAM: CT ANGIOGRAPHY CHEST WITH CONTRAST  TECHNIQUE: Multidetector CT imaging of the chest was performed using the standard protocol during bolus administration of intravenous contrast. Multiplanar CT image reconstructions and MIPs were obtained to evaluate the vascular anatomy.  CONTRAST:  OMNIPAQUE IOHEXOL 350 MG/ML SOLN  COMPARISON:  None.  FINDINGS: Mediastinum: There is moderate cardiac enlargement. The patient is status post median sternotomy and CABG procedure. No pericardial effusion identified. The trachea appears patent and is midline. Normal appearance of the esophagus. Prominent AP window lymph node measures 1.4 cm, image number 35/series 6. The main pulmonary artery appears patent. No lobar or segmental pulmonary artery filling defects to suggest a clinically significant pulmonary embolus.  Lungs/Pleura: No airspace consolidation. Mild lower lobe predominant dependent changes are identified including subsegmental atelectasis.  Upper Abdomen: The visualized portions of the liver and spleen are normal. The adrenal glands are unremarkable. The visualized portions of the gallbladder are also normal.  Musculoskeletal: Mild multi level degenerative disc disease identified within the thoracic spine. There is a curvature of the thoracic spine which is convex towards the right.  Review of the MIP images confirms the above findings.  IMPRESSION: 1. No evidence for acute pulmonary embolus. 2. Cardiac enlargement status post CABG procedure.   Electronically Signed   By: Signa Kell M.D.   On: 02/11/2014 17:21   US Pelvis Limited  02/12/2014   CLINICAL DATA:  Fever. Evaluate for abscess.  Palpable abnormality right inguinal region.  EXAM: LIMITED ULTRASOUND OF PELVIS  TECHNIQUE: Limited transabdominal ultrasound examination of the pelvis was performed.  COMPARISON:  None.  FINDINGS: A large 11.4 x 10.0 x 4.6 cm right groin complex fluid collection is noted. This would be consistent with a abscess. To exclude other pathology including hernia, CT can be obtained if needed.  IMPRESSION: Large 11.4 x 10.0 x 4.6 cm right groin complex fluid collection noted. This would be consistent with an abscess. Further evaluation with CT can be obtained if needed .   Electronically Signed   By: Maisie Fus  Register   On: 02/12/2014 10:13   Nm Myocar Multi W/spect W/wall Motion / Ef  02/19/2014   CLINICAL DATA:  Chest pain.  Prior CABG.  Hypertension.  Smoker.  EXAM: MYOCARDIAL IMAGING WITH SPECT (REST AND PHARMACOLOGIC-STRESS)  GATED LEFT VENTRICULAR WALL MOTION STUDY  LEFT VENTRICULAR EJECTION FRACTION  TECHNIQUE: Standard myocardial SPECT imaging was performed after resting intravenous injection of 10 mCi Tc-78m sestamibi. Subsequently, intravenous infusion of Lexiscan was performed under the supervision of the Cardiology staff. At peak effect of the drug, 30 mCi Tc-1m sestamibi was injected intravenously and standard myocardial SPECT imaging was performed. Quantitative gated imaging was also performed to evaluate left ventricular wall motion, and estimate left ventricular ejection fraction.  COMPARISON:  08/02/2007  FINDINGS: Perfusion: Mild fixed defect involving the inferior wall. Possibly related to diaphragmatic attenuation.  A rest area of decreased activity involves the mid anterior wall, mild. There is an area of mild reversibility, which is medium in size. This involves the apical to mid segment of the anterior and anterior lateral wall.  Wall Motion: Global hypokinesis. More focal hypokinesis to dyskinesis involving the lateral left ventricular wall.  Left Ventricular Ejection Fraction: 44 %  End diastolic  volume 960 ml  End systolic volume 73 ml  IMPRESSION: 1.  Medium-sized area of mild reversibility involving the anterior and anterior lateral walls. Suspicious for inducible ischemia.  2. Global hypokinesis with lateral wall focal hypokinesis to dyskinesis.  3. Left ventricular ejection fraction 44%  4. Intermediate-risk stress test findings*.  *2012 Appropriate Use Criteria for Coronary Revascularization Focused Update: J Am Coll Cardiol. 2012;59(9):857-881. http://content.dementiazones.com.aspx?articleid=[email or web address]   Electronically Signed   By: Jeronimo Greaves M.D.   On: 02/19/2014 16:26   Dg Chest Port 1 View  02/27/2014   CLINICAL DATA:  Respiratory failure  EXAM: PORTABLE CHEST - 1 VIEW  COMPARISON:  Portable chest x-ray of February 26, 2014  FINDINGS: The lungs remain mildly hypoinflated. The cardiopericardial silhouette remains enlarged. The retrocardiac region remains dense and the left hemidiaphragm obscured. There is engorgement of central pulmonary vascularity. There are 8 intact sternal wires from previous CABG. The permanent pacemaker is in stable position radiographically.  IMPRESSION: Congestive heart failure with pulmonary interstitial edema and small bilateral pleural effusions and persistent left lower lobe atelectasis. There has not been significant interval change since yesterday's study.   Electronically Signed   By: David  [place]   On: 02/27/2014 07:35   Dg Chest Port 1 View  02/26/2014   CLINICAL DATA:  Shortness of Breath  EXAM: PORTABLE CHEST - 1 VIEW  COMPARISON:  02/24/2014  FINDINGS: Cardiomegaly again noted. Status post CABG. Persistent mild interstitial edema bilaterally. Probable small bilateral pleural effusion with bilateral basilar atelectasis or infiltrate. Dual lead cardiac pacemaker is unchanged in position. Stable left arm PICC line position with tip in distal SVC.  IMPRESSION: Persistent mild interstitial edema bilaterally. Cardiomegaly again noted. Small bilateral pleural  effusion with bilateral basilar atelectasis or infiltrate. Status post CABG. Dual lead cardiac pacemaker is unchanged in position.   Electronically Signed   By: Natasha Mead M.D.   On: 02/26/2014 14:40   Dg Chest Port 1 View  02/24/2014   CLINICAL DATA:  Lethargy and disorientation; history of coronary artery disease and peripheral vascular disease, hypertension, and diabetes  EXAM: PORTABLE CHEST - 1 VIEW  COMPARISON:  Portable chest x-ray dated February 12, 2014  FINDINGS: The lungs are mildly hypoinflated. The interstitial markings are increased bilaterally. The cardiac silhouette is enlarged. The pulmonary vascularity is engorged. The retrocardiac region is dense. The hemidiaphragms are mildly blunted. The permanent pacemaker is in appropriate position radiographically. There are post CABG changes.  IMPRESSION: Congestive heart failure with moderate interstitial edema and small bilateral pleural effusions.   Electronically Signed   By: David  [place]   On: 02/24/2014 08:43   Dg Chest Port 1 View  02/12/2014   CLINICAL DATA:  Status post PICC line placement.  EXAM: PORTABLE CHEST - 1 VIEW  COMPARISON:  February 11, 2014.  FINDINGS: Stable cardiomediastinal silhouette. Sternotomy wires are noted. Stable mild central pulmonary vascular congestion. Right-sided pacemaker is unchanged in position. No pneumothorax or pleural effusion is noted. Interval placement of left-sided PICC line with distal tip overlying expected position of the SVC.  IMPRESSION: Interval placement of left-sided PICC line with distal tip overlying expected position of the SVC.   Electronically Signed   By: Roque Lias M.D.   On: 02/12/2014 14:52   Ct Angio Abd/pel W/ And/or W/o  02/14/2014   CLINICAL DATA:  Complex fluid collection of the right groin and status post prior aortobifemoral bypass grafting in 2007 with prior revision of the mid pole right graft limb  EXAM: CTA ABDOMEN AND PELVIS WITHOUT CONTRAST  TECHNIQUE: Multidetector CT  imaging of  the abdomen and pelvis was performed using the standard protocol during bolus administration of intravenous contrast. Multiplanar reconstructed images and MIPs were obtained and reviewed to evaluate the vascular anatomy.  CONTRAST:  80mL OMNIPAQUE IOHEXOL 350 MG/ML SOLN  COMPARISON:  Right groin ultrasound on 02/12/2014  FINDINGS: Complex fluid collection of the right inguinal region present with 3 separate components that likely communicate with 1 another. Superior component is associated with the inferior abdominal wall and measures approximately 5.5 cm in diameter. Surrounding inflammation present. The deep portion of this fluid collection abuts the distal right limb of an aortobifemoral graft without evidence of graft pseudoaneurysm or erosion. No air is identified adjacent to the graft or in the fluid collection  Communicating second pocket of fluid extends towards the lower inguinal region and measures approximately 5 cm in diameter. There is a soft tissue wound just superficial to the second component of loculated fluid. The wound itself does not appear to extend all the way to the level of fluid. Third communicating pocket extends into the right inguinal canal and measures approximately 6 cm in greatest diameter. In the coronal projection, all of the fluid collections at up to roughly 12 cm in maximal diameter. Findings are likely consistent with multi loculated abscess given constellation of CT and ultrasound findings.  The aortobifemoral graft itself shows normal patency. Both native SFA origins appear to likely be occluded chronically. Native abdominal aorta above the graft shows no evidence of aneurysmal disease or significant stenosis. There is approximately 80% narrowing at the origin of the celiac axis. The superior mesenteric artery trunk shows diffuse and significant atherosclerosis. Bilateral renal arteries are heavily calcified proximally.  The liver shows steatosis. There is reflux of  contrast into the intrahepatic IVC and hepatic veins consistent with a component of right heart failure. The visualized lung bases show small bilateral pleural effusions.  Rounded hyperdense cortical lesion of the lateral mid left kidney measures approximately 13 mm in diameter. This represents either a hemorrhagic cyst or enhancing cortical lesion. No abnormalities are seen involving bowel. The bladder appears unremarkable. No bony abnormalities.  Review of the MIP images confirms the above findings.  IMPRESSION: 1. Complex and multi lobulated fluid collection in the right inguinal region with 3 separate components present. These likely communicate with one another and span over a distance of roughly 12 cm. Part of the fluid collection does abut the distal right limb of the aortobifemoral graft. No evidence of gas in the fluid or graft pseudoaneurysm. This most likely represents a complex abscess. Inferior component of fluid extends into the right inguinal canal. 2. Probable underlying right heart failure with small bilateral pleural effusions. 3. 13 mm hyperdense cortical lesion of the mid lateral left renal cortex. This is incompletely evaluated on the CTA study. This could represent a hyperdense/hemorrhagic cyst or enhancing cortical neoplasm. Followup is recommended. The most definitive evaluation would be MRI of the abdomen when the patient is able to tolerate MRI examination.   Electronically Signed   By: Irish LackGlenn  Yamagata M.D.   On: 02/14/2014 15:41    Jeoffrey MassedGHIMIRE,Kamika Goodloe, MD  [facility] Pager:[phone]  If 7PM-7AM, please contact night-coverage [email or web address] Password [email or web address] 03/02/2014, 1:55 PM   LOS: 19 days

## 2014-03-03 ENCOUNTER — Telehealth: Payer: Self-pay | Admitting: Vascular Surgery

## 2014-03-03 LAB — GLUCOSE, CAPILLARY
GLUCOSE-CAPILLARY: 64 mg/dL — AB (ref 70–99)
GLUCOSE-CAPILLARY: 84 mg/dL (ref 70–99)
GLUCOSE-CAPILLARY: 144 mg/dL — AB (ref 70–99)
GLUCOSE-CAPILLARY: 158 mg/dL — AB (ref 70–99)
Glucose-Capillary: 144 mg/dL — ABNORMAL HIGH (ref 70–99)

## 2014-03-03 LAB — CBC
HCT: 25.4 % — ABNORMAL LOW (ref 39.0–52.0)
HEMOGLOBIN: 8.1 g/dL — AB (ref 13.0–17.0)
MCH: 25.7 pg — ABNORMAL LOW (ref 26.0–34.0)
MCHC: 31.9 g/dL (ref 30.0–36.0)
MCV: 80.6 fL (ref 78.0–100.0)
PLATELETS: 438 10*3/uL — AB (ref 150–400)
RBC: 3.15 MIL/uL — ABNORMAL LOW (ref 4.22–5.81)
RDW: 14.4 % (ref 11.5–15.5)
WBC: 6.7 10*3/uL (ref 4.0–10.5)

## 2014-03-03 LAB — BASIC METABOLIC PANEL
Anion gap: 6 (ref 5–15)
BUN: 59 mg/dL — ABNORMAL HIGH (ref 6–23)
CHLORIDE: 100 meq/L (ref 96–112)
CO2: 24 mmol/L (ref 19–32)
CREATININE: 3.32 mg/dL — AB (ref 0.50–1.35)
Calcium: 8.4 mg/dL (ref 8.4–10.5)
GFR calc Af Amer: 21 mL/min — ABNORMAL LOW (ref >90)
GFR calc non Af Amer: 18 mL/min — ABNORMAL LOW (ref >90)
Glucose, Bld: 118 mg/dL — ABNORMAL HIGH (ref 70–99)
Potassium: 4.6 mmol/L (ref 3.5–5.1)
Sodium: 130 mmol/L — ABNORMAL LOW (ref 135–145)

## 2014-03-03 MED ORDER — TRAMADOL HCL 50 MG PO TABS: 50.0000 mg | ORAL_TABLET | Freq: Two times a day (BID) | ORAL | Status: DC | PRN

## 2014-03-03 MED ORDER — SODIUM CHLORIDE 0.9 % IV SOLN: 600.0000 mg | INTRAVENOUS | Status: DC

## 2014-03-03 MED ORDER — CALCIUM CARBONATE ANTACID 500 MG PO CHEW: 1.0000 | CHEWABLE_TABLET | Freq: Two times a day (BID) | ORAL | Status: AC | PRN

## 2014-03-03 MED ORDER — ONDANSETRON HCL 4 MG PO TABS: 4.0000 mg | ORAL_TABLET | Freq: Four times a day (QID) | ORAL | Status: AC | PRN

## 2014-03-03 MED ORDER — METOPROLOL TARTRATE 25 MG PO TABS: 50.0000 mg | ORAL_TABLET | Freq: Two times a day (BID) | ORAL | Status: AC

## 2014-03-03 MED ORDER — FUROSEMIDE 40 MG PO TABS: 40.0000 mg | ORAL_TABLET | Freq: Every day | ORAL | Status: DC

## 2014-03-03 MED ORDER — RIFAMPIN 300 MG PO CAPS: 300.0000 mg | ORAL_CAPSULE | Freq: Two times a day (BID) | ORAL | Status: AC

## 2014-03-03 MED ORDER — ATORVASTATIN CALCIUM 80 MG PO TABS: 80.0000 mg | ORAL_TABLET | Freq: Every day | ORAL | Status: AC

## 2014-03-03 MED ORDER — ISOSORBIDE MONONITRATE ER 30 MG PO TB24: 30.0000 mg | ORAL_TABLET | Freq: Every day | ORAL | Status: AC

## 2014-03-03 MED ORDER — INSULIN DETEMIR 100 UNIT/ML FLEXPEN: 25.0000 [IU] | PEN_INJECTOR | Freq: Every day | SUBCUTANEOUS | Status: DC

## 2014-03-03 MED ORDER — POLYETHYLENE GLYCOL 3350 17 G PO PACK: 17.0000 g | PACK | Freq: Every day | ORAL | Status: AC

## 2014-03-03 MED ORDER — HYDRALAZINE HCL 50 MG PO TABS: 50.0000 mg | ORAL_TABLET | Freq: Three times a day (TID) | ORAL | Status: DC

## 2014-03-03 MED ORDER — INSULIN ASPART 100 UNIT/ML ~~LOC~~ SOLN: SUBCUTANEOUS | Status: DC

## 2014-03-03 NOTE — Telephone Encounter (Signed)
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  F/u with Dr. Edilia Boickson in 2 weeks.  Thanks, Selena BattenKim

## 2014-03-03 NOTE — Telephone Encounter (Signed)
Patients phone # is busy x 3- mailed letter, dpm

## 2014-03-03 NOTE — Progress Notes (Signed)
ANTIBIOTIC CONSULT NOTE - FOLLOW UP  Pharmacy Consult for Daptomycin Indication: Vascular graft infection  Allergies  Allergen Reactions  . Morphine And Related     Pt felt flushed and was diaphoretic     Patient Measurements: Height:  (165.1 cm) Weight: 214 lb 11.7 oz (97.4 kg) IBW/kg (Calculated) : 61.5   Vital Signs: Temp: 98.7 F (37.1 C) (01/18 0505) Temp Source: Oral (01/18 0505) BP: 158/71 mmHg (01/18 1219) Pulse Rate: 70 (01/18 1041) Intake/Output from previous day: 01/17 0701 - 01/18 0700 In: 540 [P.O.:540] Out: 701 [Urine:700; Stool:1] Intake/Output from this shift: Total I/O In: 600 [P.O.:600] Out: 300 [Urine:300]  Labs:  Recent Labs  03/01/14 0415 03/02/14 0500 03/03/14 0710  WBC  --   --  6.7  HGB  --   --  8.1*  PLT  --   --  438*  CREATININE 3.74* 3.59* 3.32*   Estimated Creatinine Clearance: 24.1 mL/min (by C-G formula based on Cr of 3.32). No results for input(s): VANCOTROUGH, VANCOPEAK, VANCORANDOM, GENTTROUGH, GENTPEAK, GENTRANDOM, TOBRATROUGH, TOBRAPEAK, TOBRARND, AMIKACINPEAK, AMIKACINTROU, AMIKACIN in the last 72 hours.   Microbiology: Recent Results (from the past 720 hour(s))  Blood culture (routine x 2)     Status: None   Collection Time: 02/11/14  2:29 PM  Result Value Ref Range Status   Specimen Description BLOOD RIGHT HAND  Final   Special Requests BOTTLES DRAWN AEROBIC AND ANAEROBIC 4CC  Final   Culture NO GROWTH 5 DAYS  Final   Report Status 02/16/2014 FINAL  Final  Blood culture (routine x 2)     Status: None   Collection Time: 02/11/14  2:33 PM  Result Value Ref Range Status   Specimen Description BLOOD RIGHT ANTECUBITAL  Final   Special Requests BOTTLES DRAWN AEROBIC ONLY 6CC  Final   Culture NO GROWTH 5 DAYS  Final   Report Status 02/16/2014 FINAL  Final  Urine culture     Status: None   Collection Time: 02/11/14  2:56 PM  Result Value Ref Range Status   Specimen Description URINE, CLEAN CATCH  Final   Special  Requests NONE  Final   Colony Count NO GROWTH Performed at [facility]   Final   Culture NO GROWTH Performed at [facility]   Final   Report Status 02/12/2014 FINAL  Final  MRSA PCR Screening     Status: None   Collection Time: 02/11/14  6:40 PM  Result Value Ref Range Status   MRSA by PCR NEGATIVE NEGATIVE Final    Comment:        The GeneXpert MRSA Assay (FDA approved for NASAL specimens only), is one component of a comprehensive MRSA colonization surveillance program. It is not intended to diagnose MRSA infection nor to guide or monitor treatment for MRSA infections.   Wound culture     Status: None   Collection Time: 02/14/14 10:08 AM  Result Value Ref Range Status   Specimen Description WOUND GROIN RIGHT  Final   Special Requests NONE  Final   Gram Stain   Final    NO WBC SEEN NO SQUAMOUS EPITHELIAL CELLS SEEN NO ORGANISMS SEEN Performed at [facility]    Culture   Final    NO GROWTH 3 DAYS Performed at [facility]    Report Status 02/17/2014 FINAL  Final  Body fluid culture     Status: None   Collection Time: 02/15/14  9:49 AM  Result Value Ref Range Status  Specimen Description FLUID RIGHT DRAINAGE GROIN  Final   Special Requests NONE  Final   Gram Stain   Final    ABUNDANT WBC PRESENT, PREDOMINANTLY PMN RARE GRAM POSITIVE COCCI IN PAIRS Performed at [facility]    Culture   Final    MODERATE METHICILLIN RESISTANT STAPHYLOCOCCUS AUREUS Note: RIFAMPIN AND GENTAMICIN SHOULD NOT BE USED AS SINGLE DRUGS FOR TREATMENT OF STAPH INFECTIONS. This organism DOES NOT demonstrate inducible Clindamycin resistance in vitro. CRITICAL RESULT CALLED TO, READ BACK BY AND VERIFIED WITH: CAROL@8 :58AM ON  02/17/13 BY DANTS Performed at [facility]    Report Status 02/17/2014 FINAL  Final   Organism ID, Bacteria METHICILLIN RESISTANT STAPHYLOCOCCUS AUREUS  Final      Susceptibility   Methicillin resistant staphylococcus  aureus - MIC*    CLINDAMYCIN <=0.25 SENSITIVE Sensitive     ERYTHROMYCIN >=8 RESISTANT Resistant     GENTAMICIN <=0.5 SENSITIVE Sensitive     LEVOFLOXACIN >=8 RESISTANT Resistant     OXACILLIN >=4 RESISTANT Resistant     PENICILLIN >=0.5 RESISTANT Resistant     RIFAMPIN <=0.5 SENSITIVE Sensitive     TRIMETH/SULFA >=320 RESISTANT Resistant     VANCOMYCIN 1 SENSITIVE Sensitive     TETRACYCLINE <=1 SENSITIVE Sensitive     * MODERATE METHICILLIN RESISTANT STAPHYLOCOCCUS AUREUS  MRSA PCR Screening     Status: None   Collection Time: 02/21/14  4:11 AM  Result Value Ref Range Status   MRSA by PCR NEGATIVE NEGATIVE Final    Comment:        The GeneXpert MRSA Assay (FDA approved for NASAL specimens only), is one component of a comprehensive MRSA colonization surveillance program. It is not intended to diagnose MRSA infection nor to guide or monitor treatment for MRSA infections.     Anti-infectives    Start     Dose/Rate Route Frequency Ordered Stop   03/03/14 0000  DAPTOmycin 600 mg in sodium chloride 0.9 % 100 mL     600 mg224 mL/hr over 30 Minutes Intravenous Every 48 hours 03/03/14 0925     03/03/14 0000  rifampin (RIFADIN) 300 MG capsule     300 mg Oral Every 12 hours 03/03/14 0925     02/28/14 1800  DAPTOmycin (CUBICIN) 600 mg in sodium chloride 0.9 % IVPB  Status:  Discontinued     600 mg224 mL/hr over 30 Minutes Intravenous Every 24 hours 02/28/14 1345 02/28/14 1346   02/28/14 1800  DAPTOmycin (CUBICIN) 600 mg in sodium chloride 0.9 % IVPB     600 mg224 mL/hr over 30 Minutes Intravenous Every 48 hours 02/28/14 1346     02/27/14 2200  rifampin (RIFADIN) capsule 300 mg     300 mg Oral Every 12 hours 02/27/14 1512     02/26/14 2200  rifampin (RIFADIN) 300 mg in sodium chloride 0.9 % 100 mL IVPB  Status:  Discontinued     300 mg200 mL/hr over 30 Minutes Intravenous Every 12 hours 02/26/14 2112 02/27/14 1512   02/21/14 2200  rifampin (RIFADIN) capsule 300 mg  Status:   Discontinued     300 mg Oral Every 12 hours 02/21/14 2015 02/26/14 2112   02/21/14 1049  polymyxin B 500,000 Units, bacitracin 50,000 Units in sodium chloride irrigation 0.9 % 500 mL irrigation  Status:  Discontinued       As needed 02/21/14 1050 02/21/14 1209   02/16/14 2000  ceFEPIme (MAXIPIME) 1 g in dextrose 5 % 50 mL IVPB  Status:  Discontinued     1 g100 mL/hr over 30 Minutes Intravenous Every 12 hours 02/16/14 0938 02/17/14 1357   02/14/14 0730  ceFEPIme (MAXIPIME) 1 g in dextrose 5 % 50 mL IVPB  Status:  Discontinued     1 g100 mL/hr over 30 Minutes Intravenous Every 24 hours 02/13/14 1035 02/16/14 0938   02/12/14 1000  oseltamivir (TAMIFLU) capsule 75 mg  Status:  Discontinued     75 mg Oral 2 times daily 02/12/14 0743 02/12/14 0747   02/12/14 1000  oseltamivir (TAMIFLU) capsule 30 mg  Status:  Discontinued     30 mg Oral 2 times daily 02/12/14 0749 02/16/14 0826   02/12/14 0600  vancomycin (VANCOCIN) IVPB 750 mg/150 ml premix  Status:  Discontinued     750 mg150 mL/hr over 60 Minutes Intravenous Every 12 hours 02/11/14 2047 02/23/14 1038   02/12/14 0000  ceFEPIme (MAXIPIME) 1 g in dextrose 5 % 50 mL IVPB  Status:  Discontinued     1 g100 mL/hr over 30 Minutes Intravenous Every 8 hours 02/11/14 2047 02/13/14 1035   02/11/14 1545  vancomycin (VANCOCIN) 1,500 mg in sodium chloride 0.9 % 500 mL IVPB     1,500 mg250 mL/hr over 120 Minutes Intravenous  Once 02/11/14 1537 02/11/14 1940   02/11/14 1545  ceFEPIme (MAXIPIME) 2 g in dextrose 5 % 50 mL IVPB     2 g100 mL/hr over 30 Minutes Intravenous  Once 02/11/14 1537 02/11/14 1642      Assessment: 64 yo M presents on 12/29 fever, dyspnea. He has had some chills and cough as well. Ultrasound showed fluid collection of his R groin. 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  Abx day #20 for MRSA in groin wound/R femoral graft infxn. I&D on 1/2. PICC replaced 1/13. Afebrile, WBC 6.Scr peaked at 4.26, now 3.2 CrCl ~6220ml/min.   Transitioned  from Vancomycin to daptomycin on 1/15, expected duration of abx is 6 weeks. No complications noted. Home Abx being arranged by CSW and Case management  Goal of Therapy:  Eradication of infection  Plan:  Continue Daptomycin 6mg /kg (600mg ) IV q48 hours - will need to change to daily when crcl improves to >5630ml/min Recommend checking CK weekly while on daptomycin Continue to follow progression and toleration Continue Rifampin 300mg  bid  Sheppard CoilFrank Wilson PharmD., BCPS Clinical Pharmacist Pager [phone] 03/03/2014 1:41 PM

## 2014-03-03 NOTE — Progress Notes (Signed)
Medicare Important Message given? YES  (If response is "NO", the following Medicare IM given date fields will be blank)  Date Medicare IM given: 03/03/14 Medicare IM given by:  Greer Wainright  

## 2014-03-03 NOTE — Discharge Summary (Addendum)
PATIENT DETAILS Name: [name] Age: 64 y.o. Sex: male Date of Birth: 07/26/1950 MRN: [id number]. Admitting Physician: Wilson SingerNimish C Gosrani, MD GMW:NUUVOZPCP:Dawson, [name], MD  Admit Date: 02/11/2014 Discharge date: 03/04/2014  Recommendations for Outpatient Follow-up:  1. 6 weeks of IV daptomycin and oral rifampin from 02/28/14 2. Check weekly CBC, chemistries and CK [facility] 3. Dosing of daptomycin may need to be changed if renal function continues to improve-currently every other day 4. Will need outpatient sleep study, continue CPAP while at SNF 5. 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 Arteriosclerotic cardiovascular disease (ASCVD)     CABG in 2003 for 3 VD; 07/2007: Stress nuclear-inferior infarction without ischemia; normal EF by echo in 06/2005  . Diabetes mellitus type II   . Hyperlipidemia   . Degenerative joint disease     Left knee  . Cerebrovascular disease 2003    Carotid ultrasound in 2010: Patent right CEA site, 60-79% left internal carotid artery  . Peripheral  vascular disease 2007    aortobifemoral BPG-2007  . Anemia   . Peripheral neuropathy   . Hypertension   . Sick sinus syndrome 1996    permanent pacemaker-1996; generator change in 2010  . Chronic obstructive pulmonary disease   . Obesity   . Obstructive sleep apnea   . Benign prostatic hypertrophy   . Gastroesophageal reflux disease   . Tobacco abuse, in remission     50 pack years total consumption; Quit in 2007    DISCHARGE MEDICATIONS: Current Discharge Medication List    START taking these medications   Details  atorvastatin (LIPITOR) 80 MG tablet Take 1 tablet (80 mg total) by mouth daily at 6 PM.    calcium carbonate (TUMS - DOSED IN MG ELEMENTAL CALCIUM) 500 MG chewable tablet Chew 1 tablet (200 mg of elemental calcium total) by mouth 2 (two) times daily as needed for indigestion or heartburn.    DAPTOmycin 600 mg in sodium chloride 0.9 % 100 mL Inject 600 mg into the vein every other day. For 6 weeks from 02/28/14. Next dose due on 03/06/14.Please follow electrolytes, once creatinine better dosage may need to be changed.    furosemide (LASIX) 40 MG tablet Take 1 tablet (40 mg total) by mouth daily. Qty: 30 tablet    hydrALAZINE (APRESOLINE) 50 MG tablet Take 1 tablet (50 mg total) by mouth every 8 (eight) hours.    insulin aspart (NOVOLOG) 100 UNIT/ML injection insulin aspart (novoLOG) injection 0-20 Units 0-20 Units, Subcutaneous, 3 times daily with meals CBG < 70:  implement hypoglycemia protocol CBG 70 - 120: 0 units CBG 121 - 150: 3 units CBG 151 - 200: 4 units CBG 201 - 250: 7 units CBG 251 - 300: 11 units CBG 301 - 350: 15 units CBG 351 - 400: 20 units CBG > 400: call MD Qty: 10 mL, Refills: 11    isosorbide mononitrate (IMDUR) 30 MG 24 hr tablet Take 1 tablet (30 mg total) by mouth daily.    ondansetron (ZOFRAN) 4 MG tablet Take 1 tablet (4 mg total) by mouth every 6 (six) hours as needed for nausea. Qty: 20 tablet, Refills: 0    polyethylene glycol  (MIRALAX / GLYCOLAX) packet Take 17 g by mouth daily. Qty: 14 each, Refills: 0    rifampin (RIFADIN) 300 MG capsule Take 1 capsule (300 mg total) by mouth every 12 (twelve) hours. 6 weeks from 02/28/14      CONTINUE these medications which have CHANGED   Details  Insulin Detemir (LEVEMIR FLEXTOUCH) 100 UNIT/ML Pen Inject 25 Units into the skin daily at 10 pm. Qty: 15 mL, Refills: 11    metoprolol tartrate (LOPRESSOR) 25 MG tablet Take 2 tablets (50 mg total) by mouth 2 (two) times daily. Qty: 180 tablet, Refills: 3    traMADol (ULTRAM) 50 MG tablet Take 1 tablet (50 mg total) by mouth every 12 (twelve) hours as needed for moderate pain. Qty: 30 tablet, Refills: 0      CONTINUE these medications which have NOT CHANGED   Details  aspirin 81 MG tablet Take 1 tablet (81 mg total) by mouth daily. Qty: 90 tablet, Refills: 3    ferrous sulfate 325 (65 FE) MG tablet Take 325 mg by mouth 2 (two) times daily. Refills: 0    acetaminophen (TYLENOL) 500 MG tablet Take 2 tablets (1,000 mg total) by mouth every 6 (six) hours as needed. Pain. Qty: 90 tablet, Refills: 3    albuterol (PROVENTIL HFA;VENTOLIN HFA) 108 (90 BASE) MCG/ACT inhaler Inhale 2 puffs into the lungs every 4 (four) hours as needed. Qty: 18 g, Refills: 3    cloNIDine (CATAPRES - DOSED IN MG/24 HR) 0.1 mg/24hr patch Place 0.1 mg onto the skin once a week.      STOP taking these medications     gabapentin (NEURONTIN) 300 MG capsule      insulin lispro (HUMALOG) 100 UNIT/ML KiwkPen      lisinopril (PRINIVIL,ZESTRIL) 5 MG tablet      simvastatin (ZOCOR) 10 MG tablet      sulfamethoxazole-trimethoprim (BACTRIM DS,SEPTRA DS) 800-160 MG per tablet      triamcinolone cream (KENALOG) 0.1 %      BAYER CONTOUR NEXT TEST test strip         ALLERGIES:   Allergies  Allergen Reactions  . Morphine And Related     Pt felt flushed and was diaphoretic     BRIEF HPI:  See H&P, Labs, Consult and Test reports for all details  in brief, patient is a 64 year old male with history of peripheral vascular disease, COPD, chronic kidney disease, CAD who was admitted for evaluation of fever and dyspnea.  CONSULTATIONS:   cardiology, pulmonary/intensive care, ID, nephrology, vascular surgery and Plastics  PERTINENT RADIOLOGIC STUDIES: Dg Chest 2 View  02/11/2014   CLINICAL DATA:  Fever.  Hypoxia.  EXAM: CHEST  2 VIEW  COMPARISON:  07/15/2005 and 07/10/2005  FINDINGS: Pacemaker in place. Previous median sternotomy. There is slight pulmonary vascular congestion but overall heart size is normal. The hilar structures  are prominent, right more than left but I feel this is due to the vascular congestion. No effusions. No acute osseous abnormality.  IMPRESSION: Slight pulmonary vascular congestion.   Electronically Signed   By: Geanie [name] M.D.   On: 02/11/2014 15:53   Ct Head Wo Contrast  02/25/2014   CLINICAL DATA:  64 year old with confusion  EXAM: CT HEAD WITHOUT CONTRAST  TECHNIQUE: Contiguous axial images were obtained from the base of the skull through the vertex without intravenous contrast.  COMPARISON:  12/31/2013  FINDINGS: The gray-white differentiation is preserved.  There is no evidence of an acute infarct.  There is no acute intracranial hemorrhage.  There is no midline shift or mass effect.  There is no extra-axial fluid collection.  Soft tissue opacification is noted in the partially visualized right ethmoid sinuses. The remaining paranasal sinuses and mastoid air cells are aerated.  The orbits are intact.  IMPRESSION: 1. No acute intracranial process. 2. Partially visualized right ethmoid sinus disease.   Electronically Signed   By: Fannie Knee   On: 02/25/2014 11:28   Ct Angio Chest Pe W/cm &/or Wo Cm  02/11/2014   CLINICAL DATA:  Short of breath and hypoxia.  Chills and fever  EXAM: CT ANGIOGRAPHY CHEST WITH CONTRAST  TECHNIQUE: Multidetector CT imaging of the chest was performed using the standard protocol during  bolus administration of intravenous contrast. Multiplanar CT image reconstructions and MIPs were obtained to evaluate the vascular anatomy.  CONTRAST:  OMNIPAQUE IOHEXOL 350 MG/ML SOLN  COMPARISON:  None.  FINDINGS: Mediastinum: There is moderate cardiac enlargement. The patient is status post median sternotomy and CABG procedure. No pericardial effusion identified. The trachea appears patent and is midline. Normal appearance of the esophagus. Prominent AP window lymph node measures 1.4 cm, image number 35/series 6. The main pulmonary artery appears patent. No lobar or segmental pulmonary artery filling defects to suggest a clinically significant pulmonary embolus.  Lungs/Pleura: No airspace consolidation. Mild lower lobe predominant dependent changes are identified including subsegmental atelectasis.  Upper Abdomen: The visualized portions of the liver and spleen are normal. The adrenal glands are unremarkable. The visualized portions of the gallbladder are also normal.  Musculoskeletal: Mild multi level degenerative disc disease identified within the thoracic spine. There is a curvature of the thoracic spine which is convex towards the right.  Review of the MIP images confirms the above findings.  IMPRESSION: 1. No evidence for acute pulmonary embolus. 2. Cardiac enlargement status post CABG procedure.   Electronically Signed   By: Signa Kell M.D.   On: 02/11/2014 17:21   US Pelvis Limited  02/12/2014   CLINICAL DATA:  Fever. Evaluate for abscess. Palpable abnormality right inguinal region.  EXAM: LIMITED ULTRASOUND OF PELVIS  TECHNIQUE: Limited transabdominal ultrasound examination of the pelvis was performed.  COMPARISON:  None.  FINDINGS: A large 11.4 x 10.0 x 4.6 cm right groin complex fluid collection is noted. This would be consistent with a abscess. To exclude other pathology including hernia, CT can be obtained if needed.  IMPRESSION: Large 11.4 x 10.0 x 4.6 cm right groin complex fluid  collection noted. This would be consistent with an abscess. Further evaluation with CT can be obtained if needed .   Electronically Signed   By: Maisie Fus  Register   On: 02/12/2014 10:13   Nm Myocar Multi W/spect W/wall Motion / Ef  02/19/2014   CLINICAL DATA:  Chest pain.  Prior CABG.  Hypertension.  Smoker.  EXAM: MYOCARDIAL IMAGING  WITH SPECT (REST AND PHARMACOLOGIC-STRESS)  GATED LEFT VENTRICULAR WALL MOTION STUDY  LEFT VENTRICULAR EJECTION FRACTION  TECHNIQUE: Standard myocardial SPECT imaging was performed after resting intravenous injection of 10 mCi Tc-12m sestamibi. Subsequently, intravenous infusion of Lexiscan was performed under the supervision of the Cardiology staff. At peak effect of the drug, 30 mCi Tc-38m sestamibi was injected intravenously and standard myocardial SPECT imaging was performed. Quantitative gated imaging was also performed to evaluate left ventricular wall motion, and estimate left ventricular ejection fraction.  COMPARISON:  08/02/2007  FINDINGS: Perfusion: Mild fixed defect involving the inferior wall. Possibly related to diaphragmatic attenuation.  A rest area of decreased activity involves the mid anterior wall, mild. There is an area of mild reversibility, which is medium in size. This involves the apical to mid segment of the anterior and anterior lateral wall.  Wall Motion: Global hypokinesis. More focal hypokinesis to dyskinesis involving the lateral left ventricular wall.  Left Ventricular Ejection Fraction: 44 %  End diastolic volume 130 ml  End systolic volume 73 ml  IMPRESSION: 1. Medium-sized area of mild reversibility involving the anterior and anterior lateral walls. Suspicious for inducible ischemia.  2. Global hypokinesis with lateral wall focal hypokinesis to dyskinesis.  3. Left ventricular ejection fraction 44%  4. Intermediate-risk stress test findings*.  *2012 Appropriate Use Criteria for Coronary Revascularization Focused Update: J Am Coll Cardiol.  2012;59(9):857-881. http://content.dementiazones.com.aspx?articleid=[email or web address]   Electronically Signed   By: Jeronimo Greaves M.D.   On: 02/19/2014 16:26   Dg Chest Port 1 View  02/27/2014   CLINICAL DATA:  Respiratory failure  EXAM: PORTABLE CHEST - 1 VIEW  COMPARISON:  Portable chest x-ray of February 26, 2014  FINDINGS: The lungs remain mildly hypoinflated. The cardiopericardial silhouette remains enlarged. The retrocardiac region remains dense and the left hemidiaphragm obscured. There is engorgement of central pulmonary vascularity. There are 8 intact sternal wires from previous CABG. The permanent pacemaker is in stable position radiographically.  IMPRESSION: Congestive heart failure with pulmonary interstitial edema and small bilateral pleural effusions and persistent left lower lobe atelectasis. There has not been significant interval change since yesterday's study.   Electronically Signed   By: David  [place]   On: 02/27/2014 07:35   Dg Chest Port 1 View  02/26/2014   CLINICAL DATA:  Shortness of Breath  EXAM: PORTABLE CHEST - 1 VIEW  COMPARISON:  02/24/2014  FINDINGS: Cardiomegaly again noted. Status post CABG. Persistent mild interstitial edema bilaterally. Probable small bilateral pleural effusion with bilateral basilar atelectasis or infiltrate. Dual lead cardiac pacemaker is unchanged in position. Stable left arm PICC line position with tip in distal SVC.  IMPRESSION: Persistent mild interstitial edema bilaterally. Cardiomegaly again noted. Small bilateral pleural effusion with bilateral basilar atelectasis or infiltrate. Status post CABG. Dual lead cardiac pacemaker is unchanged in position.   Electronically Signed   By: Natasha Mead M.D.   On: 02/26/2014 14:40   Dg Chest Port 1 View  02/24/2014   CLINICAL DATA:  Lethargy and disorientation; history of coronary artery disease and peripheral vascular disease, hypertension, and diabetes  EXAM: PORTABLE CHEST - 1 VIEW  COMPARISON:  Portable chest  x-ray dated February 12, 2014  FINDINGS: The lungs are mildly hypoinflated. The interstitial markings are increased bilaterally. The cardiac silhouette is enlarged. The pulmonary vascularity is engorged. The retrocardiac region is dense. The hemidiaphragms are mildly blunted. The permanent pacemaker is in appropriate position radiographically. There are post CABG changes.  IMPRESSION: Congestive heart failure with moderate interstitial edema and  small bilateral pleural effusions.   Electronically Signed   By: David  [place]   On: 02/24/2014 08:43   Dg Chest Port 1 View  02/12/2014   CLINICAL DATA:  Status post PICC line placement.  EXAM: PORTABLE CHEST - 1 VIEW  COMPARISON:  February 11, 2014.  FINDINGS: Stable cardiomediastinal silhouette. Sternotomy wires are noted. Stable mild central pulmonary vascular congestion. Right-sided pacemaker is unchanged in position. No pneumothorax or pleural effusion is noted. Interval placement of left-sided PICC line with distal tip overlying expected position of the SVC.  IMPRESSION: Interval placement of left-sided PICC line with distal tip overlying expected position of the SVC.   Electronically Signed   By: Roque Lias M.D.   On: 02/12/2014 14:52   Ct Angio Abd/pel W/ And/or W/o  02/14/2014   CLINICAL DATA:  Complex fluid collection of the right groin and status post prior aortobifemoral bypass grafting in 2007 with prior revision of the mid pole right graft limb  EXAM: CTA ABDOMEN AND PELVIS WITHOUT CONTRAST  TECHNIQUE: Multidetector CT imaging of the abdomen and pelvis was performed using the standard protocol during bolus administration of intravenous contrast. Multiplanar reconstructed images and MIPs were obtained and reviewed to evaluate the vascular anatomy.  CONTRAST:  80mL OMNIPAQUE IOHEXOL 350 MG/ML SOLN  COMPARISON:  Right groin ultrasound on 02/12/2014  FINDINGS: Complex fluid collection of the right inguinal region present with 3 separate components that  likely communicate with 1 another. Superior component is associated with the inferior abdominal wall and measures approximately 5.5 cm in diameter. Surrounding inflammation present. The deep portion of this fluid collection abuts the distal right limb of an aortobifemoral graft without evidence of graft pseudoaneurysm or erosion. No air is identified adjacent to the graft or in the fluid collection  Communicating second pocket of fluid extends towards the lower inguinal region and measures approximately 5 cm in diameter. There is a soft tissue wound just superficial to the second component of loculated fluid. The wound itself does not appear to extend all the way to the level of fluid. Third communicating pocket extends into the right inguinal canal and measures approximately 6 cm in greatest diameter. In the coronal projection, all of the fluid collections at up to roughly 12 cm in maximal diameter. Findings are likely consistent with multi loculated abscess given constellation of CT and ultrasound findings.  The aortobifemoral graft itself shows normal patency. Both native SFA origins appear to likely be occluded chronically. Native abdominal aorta above the graft shows no evidence of aneurysmal disease or significant stenosis. There is approximately 80% narrowing at the origin of the celiac axis. The superior mesenteric artery trunk shows diffuse and significant atherosclerosis. Bilateral renal arteries are heavily calcified proximally.  The liver shows steatosis. There is reflux of contrast into the intrahepatic IVC and hepatic veins consistent with a component of right heart failure. The visualized lung bases show small bilateral pleural effusions.  Rounded hyperdense cortical lesion of the lateral mid left kidney measures approximately 13 mm in diameter. This represents either a hemorrhagic cyst or enhancing cortical lesion. No abnormalities are seen involving bowel. The bladder appears unremarkable. No bony  abnormalities.  Review of the MIP images confirms the above findings.  IMPRESSION: 1. Complex and multi lobulated fluid collection in the right inguinal region with 3 separate components present. These likely communicate with one another and span over a distance of roughly 12 cm. Part of the fluid collection does abut the distal right limb of the  aortobifemoral graft. No evidence of gas in the fluid or graft pseudoaneurysm. This most likely represents a complex abscess. Inferior component of fluid extends into the right inguinal canal. 2. Probable underlying right heart failure with small bilateral pleural effusions. 3. 13 mm hyperdense cortical lesion of the mid lateral left renal cortex. This is incompletely evaluated on the CTA study. This could represent a hyperdense/hemorrhagic cyst or enhancing cortical neoplasm. Followup is recommended. The most definitive evaluation would be MRI of the abdomen when the patient is able to tolerate MRI examination.   Electronically Signed   By: Irish Lack M.D.   On: 02/14/2014 15:41     PERTINENT LAB RESULTS: CBC:  Recent Labs  03/03/14 0710  WBC 6.7  HGB 8.1*  HCT 25.4*  PLT 438*   CMET CMP     Component Value Date/Time   NA 131* 03/04/2014 0400   K 4.8 03/04/2014 0400   CL 102 03/04/2014 0400   CO2 22 03/04/2014 0400   GLUCOSE 93 03/04/2014 0400   BUN 60* 03/04/2014 0400   CREATININE 3.28* 03/04/2014 0400   CREATININE 1.12 02/10/2014 1206   CALCIUM 8.4 03/04/2014 0400   PROT 6.2 02/12/2014 0155   ALBUMIN 2.2* 03/01/2014 0415   AST 34 02/12/2014 0155   ALT 16 02/12/2014 0155   ALKPHOS 50 02/12/2014 0155   BILITOT 1.2 02/12/2014 0155   GFRNONAA 18* 03/04/2014 0400   GFRNONAA 59* 08/28/2013 1206   GFRAA 21* 03/04/2014 0400   GFRAA 68 08/28/2013 1206    GFR Estimated Creatinine Clearance: 24.5 mL/min (by C-G formula based on Cr of 3.28). No results for input(s): LIPASE, AMYLASE in the last 72 hours. No results for input(s):  CKTOTAL, CKMB, CKMBINDEX, TROPONINI in the last 72 hours. Invalid input(s): POCBNP No results for input(s): DDIMER in the last 72 hours. No results for input(s): HGBA1C in the last 72 hours. No results for input(s): CHOL, HDL, LDLCALC, TRIG, CHOLHDL, LDLDIRECT in the last 72 hours. No results for input(s): TSH, T4TOTAL, T3FREE, THYROIDAB in the last 72 hours.  Invalid input(s): FREET3 No results for input(s): VITAMINB12, FOLATE, FERRITIN, TIBC, IRON, RETICCTPCT in the last 72 hours. Coags: No results for input(s): INR in the last 72 hours.  Invalid input(s): PT Microbiology: No results found for this or any previous visit (from the past 240 hour(s)).   BRIEF HOSPITAL COURSE:  Brief narrative 64 year old male with past medical history a recent aorta femoral bypass, who came initially was admitted on 02/11/14 with acute hypoxic respiratory failure, sepsis and elevated cardiac enzymes. Patient was started on empiric antibiotics. CT imaging of the Chest was done in ED showed no PE. He developed acute kidney injury probably due to sepsis, ACE inhibitor and contrast-induced nephropathy which resolved with supportive care. Groin ultrasound showed abscess, vascular surgery consulted recommended a CT scan of the abdomen and pelvis with contrast emergently on 02/14/2014 which showed multiple loculated abscesses and possible infected native graft.He then underwent I&D on 02/15/14, wound culture was positive for MRSA. It was felt that he may have a graft infection, however none was visible on CT scan. Since patient had a history of CAD, it was felt that the patient needed preoperative cardiology evaluation before proceeding with any further surgical procedure. Patient underwent a nuclear stress test that was positive for ischemia, subsequently on 02/20/14 underwent a cardiac catheterization which showed patent grafts but severe triple vessel native disease. Subsequently on 02/21/14, plastic surgery did a gracilis  muscle flap to the right  groin. Unfortunately, patient redeveloped acute kidney injury which is felt to be multifactorial from supratherapeutic vancomycin level,possible contrast-induced nephropathy (post cath) playing a role. On 1/13, patient became acutely short of breath and developed acute hypoxic respiratory failure requiring ICU transfer and prn BiPAP.He was managed with supportive care, stabilized and transferred back to telemetry. Since then he is doing well, creatinine is slowly improving. Plans are for SNF on discharge.  Please see below for Hospital course by problem list.  Acute hypoxic respiratory failure: Multifactorial, secondary to volume overload, COPD and obstructive sleep apnea. Managed in the intensive care unit with when necessary BiPAP, bronchodilators and IV Lasix. Much improved-and now on room air with O2 saturation in the mid 90s.  Right groin abscess with suspected right femoral graft infection: On admission, underwent a groin ultrasound which showed abscess,this was confirmed on CT scan, abscess was felt to abut the distal right limb of the aortobifemoral graft.Seen by vascular surgery and underwent incision and drainage. Subsequently seen by plastic surgery and underwent a gracilis muscle flap to the right groin. Blood cultures on 12/29 negative, however culture from the right groin wound showed MRSA. Patient was subsequently seen in consult by infectious disease. He was on empiric broad-spectrum antibiotics with vancomycin and cefepime, unfortunately he developed supratherapeutic vancomycin levels along with acute renal failure. Subsequently vancomycin and cefepime were discontinued. Recommendations from infectious disease are to start daptomycin and rifampin, total duration for 6 weeks. PICC line has been placed. Will need weekly CBC, chemistries and CK to be faxed to the infectious disease clinic. Per infectious disease, after 6 weeks of IV antibiotics, he will likely need  doxycycline and rifampin.  Acute renal failure: Patient has developed acute renal failure twice this admission, during the first half this hospitalization acute renal failure was felt secondary to sepsis, contrast-induced nephropathy and ACE inhibitor use. This significantly improved with supportive care. During the latter half of his hospital stay, renal failure reoccurred post cardiac catheterization and was felt to be secondary to either contrast-induced nephropathy and/or  supratherapeutic vancomycin levels. Some suspicion for atheroembolic process was also entertained. Nephrology was consulted,recommendations are for supportive care. Creatinine slowly improving, by day of discharge, it has come down to 3.32 from a peak of 4.26. Since now developing lower extremity edema will start Lasix.suspect, creatinine will continue to improve, please continue to follow electrolytes very closely while at SNF. He will need adjustment of daptomycin dosing accordingly.   Minimally elevated troponins: Suspect secondary to demand ischemia in the setting of sepsis, worsening kidney function.Cardiac catheterization done during this hospitalization showed patent graft. Continue aspirin, statin and beta blockers  COPD: Continue with bronchodilators, lungs clear.  Hyponatremia: Mild, continue to follow. Limit fluids, now on Lasix.   Anemia: Likely secondary to acute illness, worsening renal function. No overt evidence of blood loss. Monitor CBC periodically, CBC on discharge shows hemoglobin at 8.1.  History of CAD status post CABG: Cardiac catheterization done during this hospitalization showed patent graft. Continue aspirin, statin and beta blockers.  Type 2 diabetes: CBGs currently well controlled, continue Levemir, and SSI and follow CBGs and adjust accordingly.  Hypertension: Blood pressure moderately controlled with hydralazine, Imdur, metoprolol and transdermal clonidine. Please continue to follow and adjust  medications accordingly  Dyslipidemia: Continue with statins.  Status post permanent pacemaker placement: Monitored in telemetry. Please ensure patient has follow up with EP-Dr. Ladona Ridgel  Suspected OSA: Will need outpatient sleep study and possible CPAP placement. Continue with CPAP   History of peripheral  vascular disease: Status post aortobifemoral bypass in 2007, recent revision on 01/03/14. Now with suspected graft infection. See above.   TODAY-DAY OF DISCHARGE:  Subjective:   [name] today has no headache,no chest abdominal pain,no new weakness tingling or numbness, feels much better wants to go home today.   Objective:   Blood pressure 148/65, pulse 70, temperature 98.6 F (37 C), temperature source Oral, resp. rate 18, height 5\' 5"  (1.651 m), weight 98.113 kg (216 lb 4.8 oz), SpO2 98 %.  Intake/Output Summary (Last 24 hours) at 03/04/14 0943 Last data filed at 03/04/14 1610  Gross per 24 hour  Intake    724 ml  Output    150 ml  Net    574 ml   Filed Weights   03/02/14 0457 03/03/14 0505 03/04/14 0108  Weight: 97.523 kg (215 lb) 97.4 kg (214 lb 11.7 oz) 98.113 kg (216 lb 4.8 oz)    Exam Awake Alert, Oriented *3, No new F.N deficits, Normal affect [place].AT,PERRAL Supple Neck,No JVD, No cervical lymphadenopathy appriciated.  Symmetrical Chest wall movement, Good air movement bilaterally, CTAB RRR,No Gallops,Rubs or new Murmurs, No Parasternal Heave +ve B.Sounds, Abd Soft, Non tender, No organomegaly appriciated, No rebound -guarding or rigidity. No Cyanosis, Clubbing or edema, No new Rash or bruise  DISCHARGE CONDITION: Stable  DISPOSITION: SNF  DISCHARGE INSTRUCTIONS:    Activity:  As tolerated with Full fall precautions use walker/cane & assistance as needed  Diet recommendation: Diabetic Diet Heart Healthy diet  Discharge Instructions    Call MD for:  difficulty breathing, headache or visual disturbances    Complete by:  As directed      Call MD  for:  redness, tenderness, or signs of infection (pain, swelling, redness, odor or green/yellow discharge around incision site)    Complete by:  As directed      Diet - low sodium heart healthy    Complete by:  As directed      Discharge wound care:    Complete by:  As directed   Dry dressings to groin and thigh donor site daily. May remove dressings for shower, all incisions may get wet. Soap ok.     Increase activity slowly    Complete by:  As directed            Follow-up Information    Follow up with Milinda Antis, MD. Schedule an appointment as soon as possible for a visit in 1 week.   Specialty:  Family Medicine   Why:  After discharge from SNF   Contact information:   [address] [place] [address] [phone]       Follow up with [facility], Irean Hong, MD. Schedule an appointment as soon as possible for a visit in 1 week.   Specialty:  Plastic Surgery   Contact information:   [address] [place] [address] [phone]       Follow up with DICKSON,CHRISTOPHER S, MD. Schedule an appointment as soon as possible for a visit in 2 weeks.   Specialty:  Vascular Surgery   Contact information:   [address] [place] [address] [phone]       Follow up with Garnetta Buddy, MD. Schedule an appointment as soon as possible for a visit in 2 weeks.   Specialty:  Nephrology   Contact information:   [address] [place] [address] [phone]       Follow up with Acey Lav, MD. Schedule  an appointment as soon as possible for a visit in 2 weeks.   Specialty:  Infectious Diseases   Contact information:   [address] [place] [address] [phone]       Follow up with [facility]. Schedule an appointment as soon as possible for a visit in 2 weeks.   Specialty:  Pulmonology   Why:  for sleep study   Contact information:   [address] [place] [address] [phone]        Total Time spent on discharge equals 45 minutes.  SignedJeoffrey Massed 03/04/2014 9:43 AM

## 2014-03-03 NOTE — Progress Notes (Signed)
Foley removed [address] Hoover[address] Parcel S

## 2014-03-03 NOTE — Progress Notes (Signed)
Physical Therapy Treatment Patient Details Name: [name] MRN: [id number] DOB: [birth year] Today's Date: 03/03/2014    History of Present Illness Patient is a 64 yo male admitted 02/11/14 with acute hypoxic respiratory failure, sepsis and elevated cardiac enzymes.  CT showed Rt groin abscesses and possible graft infection.  Patient s/p I&D on 02/15/14, and s/p muscle flap on 02/21/14.   PMH:  Aortofemoral BPG, DM, COPD, HTN, ASCVD, CABG, PVD, peripheral neuropathy    PT Comments    Patient making progress towards physical therapy goals. Able to ambulate two bouts of 50 and 40 feet today with min assist while using a rolling walker. Required extended rest breaks between bouts, and significant encouragement to continue with physical therapy as pt fears he will "over-do it." Overall, performing much better today functionally. Min assist to rise from low chair with arm rests. Tolerated exercises well. Patient will continue to benefit from skilled physical therapy services to further improve independence with functional mobility.   Follow Up Recommendations  SNF;Supervision/Assistance - 24 hour     Equipment Recommendations  3in1 (PT)    Recommendations for Other Services       Precautions / Restrictions Precautions Precautions: Fall Restrictions Weight Bearing Restrictions: No    Mobility  Bed Mobility               General bed mobility comments: In chair at start of therapy  Transfers Overall transfer level: Needs assistance Equipment used: Rolling walker (2 wheeled) Transfers: Sit to/from Stand Sit to Stand: Min assist         General transfer comment: Min assist for boost to stand with tactile cues and verbal cues for hand placement and facilitation to rise.  Ambulation/Gait Ambulation/Gait assistance: Min assist Ambulation Distance (Feet): 50 Feet (additional bout of 40) Assistive device: Rolling walker (2 wheeled) Gait Pattern/deviations: Step-through  pattern;Decreased stride length;Trunk flexed Gait velocity: decreased Gait velocity interpretation: Below normal speed for age/gender General Gait Details: Educated on safe DME use with a rolling walker. Min assist for walker placement within base of support. Required one standing rest break during first bout. Needs encouragement to continue.   Stairs            Wheelchair Mobility    Modified Rankin (Stroke Patients Only)       Balance                                    Cognition Arousal/Alertness: Awake/alert Behavior During Therapy: WFL for tasks assessed/performed Overall Cognitive Status: Impaired/Different from baseline Area of Impairment: Attention;Following commands;Problem solving   Current Attention Level: Focused   Following Commands: Follows one step commands consistently;Follows one step commands with increased time     Problem Solving: Slow processing;Requires verbal cues;Difficulty sequencing;Decreased initiation;Requires tactile cues      Exercises General Exercises - Lower Extremity Ankle Circles/Pumps: AAROM;Both;15 reps;Seated Quad Sets: Strengthening;Both;10 reps;Seated Long Arc Quad: Both;10 reps;Seated;Strengthening Hip Flexion/Marching: Both;10 reps;Strengthening;Seated    General Comments General comments (skin integrity, edema, etc.): LEs significantly edematous Rt>Lt, also some weeping from RLE. Left with LEs elevated, as pts feet were in dependent postion upon entering room.      Pertinent Vitals/Pain Pain Assessment: No/denies pain  HR 70s-80s SpO2 97% on room air    Home Living                      Prior Function  PT Goals (current goals can now be found in the care plan section) Acute Rehab PT Goals PT Goal Formulation: With patient/family Time For Goal Achievement: 03/02/14 Potential to Achieve Goals: Fair Progress towards PT goals: Progressing toward goals    Frequency  Min  3X/week    PT Plan Current plan remains appropriate    Co-evaluation             End of Session Equipment Utilized During Treatment: Gait belt Activity Tolerance: Patient tolerated treatment well (Distance is self-limiting, pt fears he may "over-do it") Patient left: with call bell/phone within reach;in chair;with family/visitor present     Time: 1335-1406 PT Time Calculation (min) (ACUTE ONLY): 31 min  Charges:  $Gait Training: 8-22 mins $Therapeutic Exercise: 8-22 mins                    G Codes:      [place], Rody Keadle S 03/03/2014, 2:15 PM [name] [place], [place] [phone]

## 2014-03-03 NOTE — Clinical Social Work Note (Addendum)
12:45pm CSW spoke with patient and wife at bedside- CSW to organize PT[facility] to transport patient to [facility].  CSW attempted to call [facility] to check on insurance auth- left message.   11:30am CSW spoke with Eunice Blaseebbie at [facility]- insurance authorization pending.  CSW sent most recent notes (from 1/14)  But Eunice BlaseDebbie is unsure if that is recent enough for authorization.  CSW paged PT to see if patient is on list to be evaluated again today.  CSW will continue to follow.  Merlyn LotJenna Holoman, LCSWA Clinical Social Worker [phone]

## 2014-03-03 NOTE — Progress Notes (Signed)
Inpatient Diabetes Program Recommendations  AACE/ADA: New Consensus Statement on Inpatient Glycemic Control (2013)  Target Ranges:  Prepandial:   less than 140 mg/dL      Peak postprandial:   less than 180 mg/dL (1-2 hours)      Critically ill patients:  140 - 180 mg/dL   Inpatient Diabetes Program Recommendations Insulin - Basal: decrease Levemir to 20 units  Thank you  Piedad ClimesGina Kwanza Cancelliere BSN, RN,CDE Inpatient Diabetes Coordinator [phone] (team pager)

## 2014-03-03 NOTE — Progress Notes (Signed)
LATE ENTRY, patient examined at 830 am  POD#10 gracilis muscle flap to right groin   Temp:  [98 F (36.7 C)-98.7 F (37.1 C)] 98.7 F (37.1 C) (01/18 0505) Pulse Rate:  [70] 70 (01/18 1041) Resp:  [18-20] 18 (01/18 0505) BP: (155-176)/(68-78) 158/71 mmHg (01/18 1219) SpO2:  [96 %-98 %] 96 % (01/18 0505) Weight:  [97.4 kg (214 lb 11.7 oz)] 97.4 kg (214 lb 11.7 oz) (01/18 0505)   Sitting in chair, able to transition to bed and back with min assist  PE Incisions dry, no seroma noted No cellulitis   A/P Dry dressings to groin and thigh donor site daily. May remove dressings for shower, all incisions may get wet. Soap ok.   RecTED hose on right le at least as will likely have edema from this type of surgery for a while  Staples removed. May follow up for check in 7-10 days as outpatient  Glenna FellowsBrinda Abhi Moccia, MD [facility] Plastic & Re[facility]ive Surgery [phone]

## 2014-03-04 LAB — BASIC METABOLIC PANEL
Anion gap: 7 (ref 5–15)
BUN: 60 mg/dL — ABNORMAL HIGH (ref 6–23)
CO2: 22 mmol/L (ref 19–32)
Calcium: 8.4 mg/dL (ref 8.4–10.5)
Chloride: 102 mEq/L (ref 96–112)
Creatinine, Ser: 3.28 mg/dL — ABNORMAL HIGH (ref 0.50–1.35)
GFR calc non Af Amer: 18 mL/min — ABNORMAL LOW (ref >90)
GFR, EST AFRICAN AMERICAN: 21 mL/min — AB (ref >90)
Glucose, Bld: 93 mg/dL (ref 70–99)
Potassium: 4.8 mmol/L (ref 3.5–5.1)
SODIUM: 131 mmol/L — AB (ref 135–145)

## 2014-03-04 LAB — GLUCOSE, CAPILLARY
GLUCOSE-CAPILLARY: 88 mg/dL (ref 70–99)
Glucose-Capillary: 128 mg/dL — ABNORMAL HIGH (ref 70–99)
Glucose-Capillary: 130 mg/dL — ABNORMAL HIGH (ref 70–99)

## 2014-03-04 LAB — CLOSTRIDIUM DIFFICILE BY PCR: Toxigenic C. Difficile by PCR: NEGATIVE

## 2014-03-04 MED ORDER — SODIUM CHLORIDE 0.9 % IV SOLN: 600.0000 mg | INTRAVENOUS | Status: DC

## 2014-03-04 MED ORDER — SODIUM CHLORIDE 0.9 % IV SOLN
600.0000 mg | Freq: Once | INTRAVENOUS | Status: AC
  Administered 2014-03-04: 600 mg via INTRAVENOUS
  Filled 2014-03-04: qty 12

## 2014-03-04 MED ORDER — HEPARIN SOD (PORK) LOCK FLUSH 100 UNIT/ML IV SOLN
250.0000 [IU] | INTRAVENOUS | Status: AC | PRN
  Administered 2014-03-04: 250 [IU]

## 2014-03-04 NOTE — Progress Notes (Signed)
Utilization review completed.  

## 2014-03-04 NOTE — Progress Notes (Signed)
Patient was seen and examined, he was discharged yesterday but because of insurance issues was not able to go to SNF. Per Child psychotherapistsocial worker he has a bed available today. Discharge summary from 1/18 is up-to-date. No acute issues overnight, exam remains essentially unchanged. Stable for discharge to SNF today.

## 2014-03-04 NOTE — Progress Notes (Signed)
Pt refused right groin daily dressing change.  Pt educated.  Call bell within reach and bed in lowest position.  Johnson,Ashya Nicolaisen A

## 2014-03-04 NOTE — Clinical Social Work Note (Signed)
Patient will discharge to [facility]  Anticipated discharge date:03/04/14 Family notified: at bedside Transportation by PTAR- called at 4:50  Insurance auth received at 4:45pm  CSW signing off.  Merlyn LotJenna Holoman, LCSWA Clinical Social Worker [phone]

## 2014-03-04 NOTE — Clinical Social Work Note (Signed)
[name] has not given Therapist, occupationalinsurance approval.  CSW initiate search for LOG bed- daptomycin is too expensive for LOG beds to accept.  CSW spoke with supervisor who is searching for LOG facility that will accept patient.  [name] auth still pending and hoping for approval in AM.  Patient does not have bed offer at this time and family/PT do not feel as if DC home is a safe plan.  CSW will continue to follow.  Merlyn LotJenna Holoman, LCSWA Clinical Social Worker [phone]

## 2014-03-04 NOTE — Clinical Social Work Note (Signed)
Insurance is still pending- CSW will continue to follow.  Merlyn LotJenna Holoman, LCSWA Clinical Social Worker [phone]

## 2014-03-11 ENCOUNTER — Encounter (HOSPITAL_COMMUNITY): Payer: Self-pay | Admitting: *Deleted

## 2014-03-11 ENCOUNTER — Emergency Department (HOSPITAL_COMMUNITY): Payer: Medicare HMO

## 2014-03-11 ENCOUNTER — Inpatient Hospital Stay (HOSPITAL_COMMUNITY)
Admission: EM | Admit: 2014-03-11 | Discharge: 2014-03-16 | DRG: 292 | Disposition: A | Discharge status: Skilled Nursing Facility | Payer: Medicare HMO | Attending: Internal Medicine | Admitting: Internal Medicine

## 2014-03-11 DIAGNOSIS — E785 Hyperlipidemia, unspecified: Secondary | ICD-10-CM | POA: Diagnosis present

## 2014-03-11 DIAGNOSIS — Z950 Presence of cardiac pacemaker: Secondary | ICD-10-CM

## 2014-03-11 DIAGNOSIS — I70201 Unspecified atherosclerosis of native arteries of extremities, right leg: Secondary | ICD-10-CM | POA: Diagnosis present

## 2014-03-11 DIAGNOSIS — R0600 Dyspnea, unspecified: Secondary | ICD-10-CM

## 2014-03-11 DIAGNOSIS — G4733 Obstructive sleep apnea (adult) (pediatric): Secondary | ICD-10-CM | POA: Diagnosis present

## 2014-03-11 DIAGNOSIS — I5033 Acute on chronic diastolic (congestive) heart failure: Secondary | ICD-10-CM | POA: Diagnosis not present

## 2014-03-11 DIAGNOSIS — Z8614 Personal history of Methicillin resistant Staphylococcus aureus infection: Secondary | ICD-10-CM

## 2014-03-11 DIAGNOSIS — T827XXA Infection and inflammatory reaction due to other cardiac and vascular devices, implants and grafts, initial encounter: Secondary | ICD-10-CM | POA: Diagnosis present

## 2014-03-11 DIAGNOSIS — Z8249 Family history of ischemic heart disease and other diseases of the circulatory system: Secondary | ICD-10-CM

## 2014-03-11 DIAGNOSIS — E11649 Type 2 diabetes mellitus with hypoglycemia without coma: Secondary | ICD-10-CM | POA: Diagnosis present

## 2014-03-11 DIAGNOSIS — R0902 Hypoxemia: Secondary | ICD-10-CM

## 2014-03-11 DIAGNOSIS — E669 Obesity, unspecified: Secondary | ICD-10-CM | POA: Diagnosis present

## 2014-03-11 DIAGNOSIS — I495 Sick sinus syndrome: Secondary | ICD-10-CM | POA: Diagnosis present

## 2014-03-11 DIAGNOSIS — Z794 Long term (current) use of insulin: Secondary | ICD-10-CM

## 2014-03-11 DIAGNOSIS — K219 Gastro-esophageal reflux disease without esophagitis: Secondary | ICD-10-CM | POA: Diagnosis present

## 2014-03-11 DIAGNOSIS — Z87891 Personal history of nicotine dependence: Secondary | ICD-10-CM

## 2014-03-11 DIAGNOSIS — E1140 Type 2 diabetes mellitus with diabetic neuropathy, unspecified: Secondary | ICD-10-CM | POA: Diagnosis present

## 2014-03-11 DIAGNOSIS — E875 Hyperkalemia: Secondary | ICD-10-CM | POA: Diagnosis present

## 2014-03-11 DIAGNOSIS — N400 Enlarged prostate without lower urinary tract symptoms: Secondary | ICD-10-CM | POA: Diagnosis present

## 2014-03-11 DIAGNOSIS — I2510 Atherosclerotic heart disease of native coronary artery without angina pectoris: Secondary | ICD-10-CM | POA: Diagnosis present

## 2014-03-11 DIAGNOSIS — E1151 Type 2 diabetes mellitus with diabetic peripheral angiopathy without gangrene: Secondary | ICD-10-CM | POA: Diagnosis present

## 2014-03-11 DIAGNOSIS — R079 Chest pain, unspecified: Secondary | ICD-10-CM

## 2014-03-11 DIAGNOSIS — N184 Chronic kidney disease, stage 4 (severe): Secondary | ICD-10-CM | POA: Diagnosis present

## 2014-03-11 DIAGNOSIS — D649 Anemia, unspecified: Secondary | ICD-10-CM | POA: Diagnosis present

## 2014-03-11 DIAGNOSIS — I70209 Unspecified atherosclerosis of native arteries of extremities, unspecified extremity: Secondary | ICD-10-CM

## 2014-03-11 DIAGNOSIS — L03115 Cellulitis of right lower limb: Secondary | ICD-10-CM | POA: Diagnosis present

## 2014-03-11 DIAGNOSIS — M179 Osteoarthritis of knee, unspecified: Secondary | ICD-10-CM | POA: Diagnosis present

## 2014-03-11 DIAGNOSIS — Z951 Presence of aortocoronary bypass graft: Secondary | ICD-10-CM

## 2014-03-11 DIAGNOSIS — R0989 Other specified symptoms and signs involving the circulatory and respiratory systems: Secondary | ICD-10-CM | POA: Diagnosis present

## 2014-03-11 DIAGNOSIS — Z992 Dependence on renal dialysis: Secondary | ICD-10-CM

## 2014-03-11 DIAGNOSIS — Y832 Surgical operation with anastomosis, bypass or graft as the cause of abnormal reaction of the patient, or of later complication, without mention of misadventure at the time of the procedure: Secondary | ICD-10-CM | POA: Diagnosis present

## 2014-03-11 DIAGNOSIS — J209 Acute bronchitis, unspecified: Secondary | ICD-10-CM | POA: Diagnosis present

## 2014-03-11 DIAGNOSIS — R600 Localized edema: Secondary | ICD-10-CM | POA: Diagnosis present

## 2014-03-11 DIAGNOSIS — I129 Hypertensive chronic kidney disease with stage 1 through stage 4 chronic kidney disease, or unspecified chronic kidney disease: Secondary | ICD-10-CM | POA: Diagnosis present

## 2014-03-11 DIAGNOSIS — Z885 Allergy status to narcotic agent status: Secondary | ICD-10-CM

## 2014-03-11 DIAGNOSIS — L7622 Postprocedural hemorrhage and hematoma of skin and subcutaneous tissue following other procedure: Secondary | ICD-10-CM | POA: Diagnosis present

## 2014-03-11 DIAGNOSIS — Z6834 Body mass index (BMI) 34.0-34.9, adult: Secondary | ICD-10-CM

## 2014-03-11 LAB — BASIC METABOLIC PANEL
Anion gap: 7 (ref 5–15)
BUN: 55 mg/dL — ABNORMAL HIGH (ref 6–23)
CO2: 24 mmol/L (ref 19–32)
Calcium: 8.8 mg/dL (ref 8.4–10.5)
Chloride: 107 mmol/L (ref 96–112)
Creatinine, Ser: 2.68 mg/dL — ABNORMAL HIGH (ref 0.50–1.35)
GFR calc non Af Amer: 24 mL/min — ABNORMAL LOW (ref >90)
GFR, EST AFRICAN AMERICAN: 27 mL/min — AB (ref >90)
GLUCOSE: 153 mg/dL — AB (ref 70–99)
Potassium: 5.1 mmol/L (ref 3.5–5.1)
Sodium: 138 mmol/L (ref 135–145)

## 2014-03-11 LAB — BRAIN NATRIURETIC PEPTIDE: B Natriuretic Peptide: 2715.9 pg/mL — ABNORMAL HIGH (ref 0.0–100.0)

## 2014-03-11 LAB — CBC
HCT: 24.6 % — ABNORMAL LOW (ref 39.0–52.0)
Hemoglobin: 7.5 g/dL — ABNORMAL LOW (ref 13.0–17.0)
MCH: 25.3 pg — AB (ref 26.0–34.0)
MCHC: 30.5 g/dL (ref 30.0–36.0)
MCV: 82.8 fL (ref 78.0–100.0)
Platelets: 310 10*3/uL (ref 150–400)
RBC: 2.97 MIL/uL — ABNORMAL LOW (ref 4.22–5.81)
RDW: 15.5 % (ref 11.5–15.5)
WBC: 8.8 10*3/uL (ref 4.0–10.5)

## 2014-03-11 LAB — I-STAT TROPONIN, ED: TROPONIN I, POC: 0.06 ng/mL (ref 0.00–0.08)

## 2014-03-11 NOTE — ED Notes (Signed)
Pt denies complaints at this time. PA from SNF sent pt to ED for concerns of potassium level (5.2 at the facility) and fluid overload. Pitting edema noted. Lung sounds diminished. PICC line noted to L upper arm. 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 Redness noted to R lower leg.

## 2014-03-11 NOTE — ED Provider Notes (Signed)
CSN: [id number]     Arrival date & time 03/11/14  2154 History  This chart was scribed for Lyanne CoKevin M Dario Yono, MD by Richarda Overlieichard Holland, ED Scribe. This patient was seen in room A11C/A11C and the patient's care was started 11:28 PM.    Chief Complaint  Patient presents with  . Wound Infection   HPI HPI Comments: [name] is a 64 y.o. male with a history of ASCVD, DM, HTN and CHF who presents to the Emergency Department for a wound infection in his right groin. Per history pt has a infected graft that he is on IV Abx for the next 6 weeks via PICC line. He says he thinks his right leg is improving but states it is more swollen than normal. Per history, on 01/03/14 pt underwent an aorta bifemoral graft. Pt was admitted to hospital on 02/12/14 for fever and found to have an abscess overlying his right femoral graft. He underwent I&D on 02/15/14. On 02/21/14 pt underwent gracills muscle flap procedure to close the right groin wound. Discharged on long term antibiotics on 03/03/14. Has been as the nursing home since discharge.   Pt also complains of central chest pain yesterday that he experienced for approximately 20 minutes. He states he was SOB during the episode. Pt states his pain did not radiate to any other locations.  States he is not experiencing CP at this time and that his SOB has been improving. He says that he saw a doctor yesterday and was told to come to ER. Nursing home was also concerned with patient's potassium level, it was 5.2 at facility. Pt states he feels normal right now. He says he may have a had a little fever the last few days. Pt reports he is feeling better today.   Past Medical History  Diagnosis Date  . Arteriosclerotic cardiovascular disease (ASCVD)     CABG in 2003 for 3 VD; 07/2007: Stress nuclear-inferior infarction without ischemia; normal EF by echo in 06/2005  . Diabetes mellitus type II   . Hyperlipidemia   . Degenerative joint disease     Left knee  . Cerebrovascular  disease 2003    Carotid ultrasound in 2010: Patent right CEA site, 60-79% left internal carotid artery  . Peripheral vascular disease 2007    aortobifemoral BPG-2007  . Anemia   . Peripheral neuropathy   . Hypertension   . Sick sinus syndrome 1996    permanent pacemaker-1996; generator change in 2010  . Chronic obstructive pulmonary disease   . Obesity   . Obstructive sleep apnea   . Benign prostatic hypertrophy   . Gastroesophageal reflux disease   . Tobacco abuse, in remission     50 pack years total consumption; Quit in 2007   Past Surgical History  Procedure Laterality Date  . Coronary artery bypass graft  2003    three-vessel disease  . Iliac artery stent      Percutaneous intervention-right external iliac; unclear whether stent was placed  . Pacemaker insertion  1996    [employer] Accent DR dual-chamber device, serial number [id number]   . Carotid endarterectomy  2003    Right  . Pacemaker generator change  2010     St. Jude Accent DR dual-chamber device, serial number [id number]   . Aorta - bilateral femoral artery bypass graft  2007  . Femoral-popliteal bypass graft  1998    bilateral  . Colonoscopy  2012    Negative screening study.  . Ileocolonoscopy  02/20/2007  AVW:[id number] rectum, colon, and terminal ileum  . Femoral-femoral bypass graft Bilateral 01/03/2014    Procedure: BYPASS GRAFT RIGHT FEMORAL-LEFT FEMORAL ARTERY ;  Surgeon: Chuck Hint, MD;  Location: [facility] OR;  Service: Vascular;  Laterality: Bilateral;  . Endarterectomy femoral Left 01/03/2014    Procedure: ENDARTERECTOMY FEMORAL;  Surgeon: Chuck Hint, MD;  Location: [facility] OR;  Service: Vascular;  Laterality: Left;  . Intraoperative arteriogram Left 01/03/2014    Procedure: INTRA OPERATIVE ARTERIOGRAM, LEFT LOWER LEG;  Surgeon: Chuck Hint, MD;  Location: [facility] OR;  Service: Vascular;  Laterality: Left;  . Abdominal angiogram N/A 01/02/2014    Procedure: ABDOMINAL ANGIOGRAM;  Surgeon:  Fransisco Hertz, MD;  Location: [facility] CATH LAB;  Service: Cardiovascular;  Laterality: N/A;  . Peripheral vascular catheterization  01/02/2014    Procedure: LOWER EXTREMITY ANGIOGRAPHY;  Surgeon: Fransisco Hertz, MD;  Location: [facility] CATH LAB;  Service: Cardiovascular;;  . Arch aortogram  01/02/2014    Procedure: ARCH AORTOGRAM;  Surgeon: Fransisco Hertz, MD;  Location: [facility] CATH LAB;  Service: Cardiovascular;;  . Wound exploration Right 02/15/2014    Procedure: Incision and Drainage Right Groin;  Surgeon: Sherren Kerns, MD;  Location: [name] OR;  Service: Vascular;  Laterality: Right;  . Left heart catheterization with coronary/graft angiogram N/A 02/20/2014    Procedure: LEFT HEART CATHETERIZATION WITH Isabel Caprice;  Surgeon: Micheline Chapman, MD;  Location: [facility] CATH LAB;  Service: Cardiovascular;  Laterality: N/A;  . Tram Right 02/21/2014    Procedure: RIGHT GRACILIS MUSCLE FLAP TO RIGHT GROIN ;  Surgeon: Glenna Fellows, MD;  Location: [facility] OR;  Service: Plastics;  Laterality: Right;   Family History  Problem Relation Age of Onset  . Dementia Mother   . Heart attack Father   . Coronary artery disease Father   . Peripheral vascular disease Sister   . Diabetes Brother   . Peripheral vascular disease Brother   . Hypertension Brother   . Coronary artery disease Brother    History  Substance Use Topics  . Smoking status: Former Smoker -- 2.00 packs/day for 25 years    Types: Cigarettes    Quit date: 01/22/2005  . Smokeless tobacco: Former Neurosurgeon    Quit date: 02/16/1996  . Alcohol Use: No    Review of Systems  A complete 10 system review of systems was obtained and all systems are negative except as noted in the HPI and PMH.    Allergies  Morphine and related  Home Medications   Prior to Admission medications   Medication Sig Start Date End Date Taking? Authorizing Provider  acetaminophen (TYLENOL) 500 MG tablet Take 2 tablets (1,000 mg total) by mouth every 6 (six) hours as needed. Pain.  05/08/13  Yes Salley Scarlet, MD  albuterol (PROVENTIL HFA;VENTOLIN HFA) 108 (90 BASE) MCG/ACT inhaler Inhale 2 puffs into the lungs every 4 (four) hours as needed. Patient taking differently: Inhale 2 puffs into the lungs every 4 (four) hours as needed for shortness of breath.  06/21/13  Yes Salley Scarlet, MD  aspirin 81 MG tablet Take 1 tablet (81 mg total) by mouth daily. 05/08/13  Yes Salley Scarlet, MD  atorvastatin (LIPITOR) 80 MG tablet Take 1 tablet (80 mg total) by mouth daily at 6 PM. 03/03/14  Yes Shanker Levora Dredge, MD  calcium carbonate (TUMS - DOSED IN MG ELEMENTAL CALCIUM) 500 MG chewable tablet Chew 1 tablet (200 mg of elemental calcium total) by mouth 2 (two) times daily  as needed for indigestion or heartburn. 03/03/14  Yes Shanker Levora Dredge, MD  cloNIDine (CATAPRES - DOSED IN MG/24 HR) 0.1 mg/24hr patch Place 0.1 mg onto the skin once a week.   Yes Historical Provider, MD  ferrous sulfate 325 (65 FE) MG tablet Take 325 mg by mouth 2 (two) times daily. 02/01/14  Yes Historical Provider, MD  furosemide (LASIX) 40 MG tablet Take 1 tablet (40 mg total) by mouth daily. 03/03/14  Yes Shanker Levora Dredge, MD  hydrALAZINE (APRESOLINE) 50 MG tablet Take 1 tablet (50 mg total) by mouth every 8 (eight) hours. 03/03/14  Yes Shanker Levora Dredge, MD  insulin aspart (NOVOLOG) 100 UNIT/ML injection insulin aspart (novoLOG) injection 0-20 Units 0-20 Units, Subcutaneous, 3 times daily with meals CBG < 70: implement hypoglycemia protocol CBG 70 - 120: 0 units CBG 121 - 150: 3 units CBG 151 - 200: 4 units CBG 201 - 250: 7 units CBG 251 - 300: 11 units CBG 301 - 350: 15 units CBG 351 - 400: 20 units CBG > 400: call MD Patient taking differently: Inject 0-20 Units into the skin See admin instructions. insulin aspart (novoLOG) injection 0-20 Units 0-20 Units, Subcutaneous, 3 times daily with meals CBG < 70: implement hypoglycemia protocol CBG 70 - 120: 0 units CBG 121 - 150: 3 units CBG 151 - 200: 4  units CBG 201 - 250: 7 units CBG 251 - 300: 11 units CBG 301 - 350: 15 units CBG 351 - 400: 20 units CBG > 400: call MD 03/03/14  Yes Shanker Levora Dredge, MD  Insulin Detemir (LEVEMIR FLEXTOUCH) 100 UNIT/ML Pen Inject 25 Units into the skin daily at 10 pm. 03/03/14  Yes Shanker Levora Dredge, MD  isosorbide mononitrate (IMDUR) 30 MG 24 hr tablet Take 1 tablet (30 mg total) by mouth daily. 03/03/14  Yes Shanker Levora Dredge, MD  metoprolol tartrate (LOPRESSOR) 25 MG tablet Take 2 tablets (50 mg total) by mouth 2 (two) times daily. 03/03/14  Yes Shanker Levora Dredge, MD  ondansetron (ZOFRAN) 4 MG tablet Take 1 tablet (4 mg total) by mouth every 6 (six) hours as needed for nausea. 03/03/14  Yes Shanker Levora Dredge, MD  polyethylene glycol (MIRALAX / GLYCOLAX) packet Take 17 g by mouth daily. 03/03/14  Yes Shanker Levora Dredge, MD  rifampin (RIFADIN) 300 MG capsule Take 1 capsule (300 mg total) by mouth every 12 (twelve) hours. 6 weeks from 02/28/14 03/03/14  Yes Shanker Levora Dredge, MD  traMADol (ULTRAM) 50 MG tablet Take 1 tablet (50 mg total) by mouth every 12 (twelve) hours as needed for moderate pain. 03/03/14  Yes Shanker Levora Dredge, MD  DAPTOmycin 600 mg in sodium chloride 0.9 % 100 mL Inject 600 mg into the vein every other day. For 6 weeks from 02/28/14. Next dose due on 03/06/14.Please follow electrolytes, once creatinine better dosage may need to be changed. Patient not taking: Reported on 03/11/2014 03/04/14   Maretta Bees, MD   BP 161/61 mmHg  Pulse 69  Temp(Src) 98.4 F (36.9 C) (Oral)  Resp 22  SpO2 89% Physical Exam  Constitutional: He is oriented to person, place, and time. He appears well-developed and well-nourished.  HENT:  Head: Normocephalic and atraumatic.  Eyes: EOM are normal.  Neck: Normal range of motion.  Cardiovascular: Normal rate, regular rhythm and intact distal pulses.  Exam reveals no gallop and no friction rub.   No murmur heard. Pulses:      Dorsalis pedis pulses are 2+ on  the  right side.       Posterior tibial pulses are 2+ on the right side.  Pulmonary/Chest: Effort normal and breath sounds normal. No respiratory distress.  Abdominal: Soft. He exhibits no distension. There is no tenderness.  Musculoskeletal: Normal range of motion.  Right leg: Healing incisions of his right posterior thigh, without secondary signs of infection. Surgical incisions appear to be healing well. Pt with 3+ pitting edema of right lower extremity compared to left lower extremity.  Neurological: He is alert and oriented to person, place, and time.  Skin: Skin is warm and dry.  Psychiatric: He has a normal mood and affect. Judgment normal.  Nursing note and vitals reviewed.   ED Course  Procedures   DIAGNOSTIC STUDIES: Oxygen Saturation is 89% on RA, normal by my interpretation.    COORDINATION OF CARE: 11:50 PM Discussed treatment plan with pt at bedside and pt agreed to plan.   Labs Review Labs Reviewed  CBC - Abnormal; Notable for the following:    RBC 2.97 (*)    Hemoglobin 7.5 (*)    HCT 24.6 (*)    MCH 25.3 (*)    All other components within normal limits  BASIC METABOLIC PANEL - Abnormal; Notable for the following:    Glucose, Bld 153 (*)    BUN 55 (*)    Creatinine, Ser 2.68 (*)    GFR calc non Af Amer 24 (*)    GFR calc Af Amer 27 (*)    All other components within normal limits  BRAIN NATRIURETIC PEPTIDE - Abnormal; Notable for the following:    B Natriuretic Peptide 2715.9 (*)    All other components within normal limits  TROPONIN I - Abnormal; Notable for the following:    Troponin I 0.09 (*)    All other components within normal limits  I-STAT TROPOININ, ED   No results found for: PROBNP  HEMOGLOBIN  Date Value Ref Range Status  03/11/2014 7.5* 13.0 - 17.0 g/dL Final  16/11/9602 8.1* 13.0 - 17.0 g/dL Final  54/10/8117 7.9* 13.0 - 17.0 g/dL Final  14/78/2956 8.6* 13.0 - 17.0 g/dL Final    BUN  Date Value Ref Range Status  03/11/2014 55* 6 - 23  mg/dL Final  21/30/8657 60* 6 - 23 mg/dL Final  84/69/6295 59* 6 - 23 mg/dL Final  28/41/3244 61* 6 - 23 mg/dL Final   CREAT  Date Value Ref Range Status  02/10/2014 1.12 0.50 - 1.35 mg/dL Final  02/16/7251 6.64 0.50 - 1.35 mg/dL Final  40/34/7425 9.56 0.50 - 1.35 mg/dL Final  38/75/6433 2.95 0.50 - 1.35 mg/dL Final   CREATININE, SER  Date Value Ref Range Status  03/11/2014 2.68* 0.50 - 1.35 mg/dL Final  18/84/1660 6.30* 0.50 - 1.35 mg/dL Final  16/02/930 3.55* 0.50 - 1.35 mg/dL Final  73/22/0254 2.70* 0.50 - 1.35 mg/dL Final       Imaging Review Dg Chest Port 1 View  03/11/2014   CLINICAL DATA:  Infected stent in the leg. Dialysis patient. Patient has missed 3 dialysis appointment due to lack of access. Concern for fluid overload. Short of breath.  EXAM: PORTABLE CHEST - 1 VIEW  COMPARISON:  02/27/2014  FINDINGS: Postoperative changes in the mediastinum. Cardiac pacemaker. Left PICC catheter. Appliances are unchanged since prior study. Cardiac enlargement with prominent pulmonary vascularity and perihilar infiltration consistent with edema. Small bilateral pleural effusions and basilar atelectasis, greater on the left, and increasing since prior study.  IMPRESSION: Cardiac enlargement with  pulmonary vascular congestion and edema. Small bilateral pleural effusions and basilar atelectasis, increasing since prior study.   Electronically Signed   By: Burman Nieves M.D.   On: 03/11/2014 22:34  I personally reviewed the imaging tests through PACS system I reviewed available ER/hospitalization records through the EMR    EKG Interpretation   Date/Time:  Tuesday March 11 2014 22:12:26 EST Ventricular Rate:  70 PR Interval:  200 QRS Duration: 81 QT Interval:  417 QTC Calculation: 450 R Axis:   88 Text Interpretation:  Sinus rhythm Atrial premature complex Consider left  atrial enlargement Probable anterior infarct, age indeterminate No  significant change since last tracing  Confirmed by HARRISON  MD, FORREST  ([id number]) on 03/11/2014 10:16:07 PM      MDM   Final diagnoses:  None   Patient presents with chest pain shortness of breath.  He has a very complex history of the past 6 weeks.  He is concerned about his increasing swelling in his legs and does appear to have increased vascular congestion on x-ray.  Patient has allergy saturations documented in the high 80s while here in the emergency department.  He'll be placed on 2 L nasal cannula.  I think he'll benefit from IV diuresis.  IV Lasix now.  Patient will likely need duplex ultrasound of his right lower extremity to evaluate for DVT as he feels as though there has been increased swelling.  I do not believe there is an arterial problem at this time.  No convincing signs of infection.  Admit to the hospitalist.   I personally performed the services described in this documentation, which was scribed in my presence. The recorded information has been reviewed and is accurate.        Lyanne Co, MD 03/12/14 [phone]

## 2014-03-11 NOTE — ED Notes (Signed)
Pt to ED from [facility] via [facility] c/o infected stent in leg. Pt is a Monday, Wednesday, Friday dialysis patient. Has missed three dialysis appointments due to lack of access. Pt has Picc to L upper arm, bilateral stents in lower extremities. PA at facility sent pt to Ed with concerns for fluid overload. BNP results 23,531. Pt denies complaints at this time; appears to be short of breath

## 2014-03-12 ENCOUNTER — Encounter (HOSPITAL_COMMUNITY): Payer: Self-pay | Admitting: Internal Medicine

## 2014-03-12 DIAGNOSIS — E1159 Type 2 diabetes mellitus with other circulatory complications: Secondary | ICD-10-CM | POA: Diagnosis not present

## 2014-03-12 DIAGNOSIS — R600 Localized edema: Secondary | ICD-10-CM | POA: Diagnosis present

## 2014-03-12 DIAGNOSIS — M7989 Other specified soft tissue disorders: Secondary | ICD-10-CM

## 2014-03-12 DIAGNOSIS — N184 Chronic kidney disease, stage 4 (severe): Secondary | ICD-10-CM

## 2014-03-12 DIAGNOSIS — J209 Acute bronchitis, unspecified: Secondary | ICD-10-CM | POA: Diagnosis not present

## 2014-03-12 DIAGNOSIS — I70209 Unspecified atherosclerosis of native arteries of extremities, unspecified extremity: Secondary | ICD-10-CM

## 2014-03-12 DIAGNOSIS — D649 Anemia, unspecified: Secondary | ICD-10-CM

## 2014-03-12 DIAGNOSIS — I739 Peripheral vascular disease, unspecified: Secondary | ICD-10-CM

## 2014-03-12 DIAGNOSIS — R0989 Other specified symptoms and signs involving the circulatory and respiratory systems: Secondary | ICD-10-CM | POA: Diagnosis not present

## 2014-03-12 LAB — CK TOTAL AND CKMB (NOT AT ARMC)
CK TOTAL: 130 U/L (ref 7–232)
CK, MB: 5.6 ng/mL — AB (ref 0.3–4.0)
CK, MB: 5.9 ng/mL — ABNORMAL HIGH (ref 0.3–4.0)
CK, MB: 7.1 ng/mL (ref 0.3–4.0)
RELATIVE INDEX: 4.4 — AB (ref 0.0–2.5)
Relative Index: 4.5 — ABNORMAL HIGH (ref 0.0–2.5)
Relative Index: 5.5 — ABNORMAL HIGH (ref 0.0–2.5)
Total CK: 127 U/L (ref 7–232)
Total CK: 129 U/L (ref 7–232)

## 2014-03-12 LAB — GLUCOSE, CAPILLARY
GLUCOSE-CAPILLARY: 131 mg/dL — AB (ref 70–99)
Glucose-Capillary: 127 mg/dL — ABNORMAL HIGH (ref 70–99)
Glucose-Capillary: 150 mg/dL — ABNORMAL HIGH (ref 70–99)
Glucose-Capillary: 151 mg/dL — ABNORMAL HIGH (ref 70–99)

## 2014-03-12 LAB — CBC WITH DIFFERENTIAL/PLATELET
BASOS ABS: 0.1 10*3/uL (ref 0.0–0.1)
BASOS PCT: 1 % (ref 0–1)
Eosinophils Absolute: 0.6 10*3/uL (ref 0.0–0.7)
Eosinophils Relative: 6 % — ABNORMAL HIGH (ref 0–5)
HCT: 25.1 % — ABNORMAL LOW (ref 39.0–52.0)
Hemoglobin: 7.5 g/dL — ABNORMAL LOW (ref 13.0–17.0)
LYMPHS ABS: 1.5 10*3/uL (ref 0.7–4.0)
Lymphocytes Relative: 18 % (ref 12–46)
MCH: 24.9 pg — AB (ref 26.0–34.0)
MCHC: 29.9 g/dL — ABNORMAL LOW (ref 30.0–36.0)
MCV: 83.4 fL (ref 78.0–100.0)
MONO ABS: 0.7 10*3/uL (ref 0.1–1.0)
MONOS PCT: 8 % (ref 3–12)
NEUTROS ABS: 5.8 10*3/uL (ref 1.7–7.7)
Neutrophils Relative %: 67 % (ref 43–77)
PLATELETS: 306 10*3/uL (ref 150–400)
RBC: 3.01 MIL/uL — AB (ref 4.22–5.81)
RDW: 15.5 % (ref 11.5–15.5)
WBC: 8.6 10*3/uL (ref 4.0–10.5)

## 2014-03-12 LAB — COMPREHENSIVE METABOLIC PANEL
ALBUMIN: 2.5 g/dL — AB (ref 3.5–5.2)
ALT: 17 U/L (ref 0–53)
AST: 22 U/L (ref 0–37)
Alkaline Phosphatase: 132 U/L — ABNORMAL HIGH (ref 39–117)
Anion gap: 12 (ref 5–15)
BUN: 53 mg/dL — ABNORMAL HIGH (ref 6–23)
CO2: 21 mmol/L (ref 19–32)
Calcium: 8.9 mg/dL (ref 8.4–10.5)
Chloride: 107 mmol/L (ref 96–112)
Creatinine, Ser: 2.88 mg/dL — ABNORMAL HIGH (ref 0.50–1.35)
GFR calc Af Amer: 25 mL/min — ABNORMAL LOW (ref >90)
GFR calc non Af Amer: 22 mL/min — ABNORMAL LOW (ref >90)
Glucose, Bld: 144 mg/dL — ABNORMAL HIGH (ref 70–99)
Potassium: 5.3 mmol/L — ABNORMAL HIGH (ref 3.5–5.1)
Sodium: 140 mmol/L (ref 135–145)
TOTAL PROTEIN: 7.1 g/dL (ref 6.0–8.3)
Total Bilirubin: 0.6 mg/dL (ref 0.3–1.2)

## 2014-03-12 LAB — TROPONIN I
TROPONIN I: 0.08 ng/mL — AB (ref <0.031)
Troponin I: 0.09 ng/mL — ABNORMAL HIGH (ref <0.031)
Troponin I: 0.09 ng/mL — ABNORMAL HIGH (ref <0.031)
Troponin I: 0.09 ng/mL — ABNORMAL HIGH (ref <0.031)

## 2014-03-12 LAB — HEMOGLOBIN A1C
Hgb A1c MFr Bld: 7.6 % — ABNORMAL HIGH (ref <5.7)
Mean Plasma Glucose: 171 mg/dL — ABNORMAL HIGH (ref <117)

## 2014-03-12 MED ORDER — ACETAMINOPHEN 325 MG PO TABS: 650.0000 mg | ORAL_TABLET | Freq: Four times a day (QID) | ORAL | Status: DC | PRN

## 2014-03-12 MED ORDER — ONDANSETRON HCL 4 MG PO TABS: 4.0000 mg | ORAL_TABLET | Freq: Four times a day (QID) | ORAL | Status: DC | PRN

## 2014-03-12 MED ORDER — ISOSORBIDE MONONITRATE ER 30 MG PO TB24
30.0000 mg | ORAL_TABLET | Freq: Every day | ORAL | Status: DC
  Administered 2014-03-12 – 2014-03-16 (×5): 30 mg via ORAL
  Filled 2014-03-12 (×5): qty 1

## 2014-03-12 MED ORDER — INSULIN ASPART 100 UNIT/ML ~~LOC~~ SOLN
0.0000 [IU] | Freq: Three times a day (TID) | SUBCUTANEOUS | Status: DC
  Administered 2014-03-12 – 2014-03-14 (×6): 1 [IU] via SUBCUTANEOUS
  Administered 2014-03-15: 2 [IU] via SUBCUTANEOUS
  Administered 2014-03-15 – 2014-03-16 (×2): 1 [IU] via SUBCUTANEOUS

## 2014-03-12 MED ORDER — ACETAMINOPHEN 650 MG RE SUPP: 650.0000 mg | Freq: Four times a day (QID) | RECTAL | Status: DC | PRN

## 2014-03-12 MED ORDER — POLYETHYLENE GLYCOL 3350 17 G PO PACK
17.0000 g | PACK | Freq: Every day | ORAL | Status: DC
  Administered 2014-03-12 – 2014-03-16 (×5): 17 g via ORAL
  Filled 2014-03-12 (×5): qty 1

## 2014-03-12 MED ORDER — HYDRALAZINE HCL 50 MG PO TABS
50.0000 mg | ORAL_TABLET | Freq: Three times a day (TID) | ORAL | Status: DC
  Administered 2014-03-12: 50 mg via ORAL
  Filled 2014-03-12 (×4): qty 1

## 2014-03-12 MED ORDER — INSULIN DETEMIR 100 UNIT/ML ~~LOC~~ SOLN
25.0000 [IU] | Freq: Every day | SUBCUTANEOUS | Status: DC
  Administered 2014-03-13 (×2): 25 [IU] via SUBCUTANEOUS
  Filled 2014-03-12 (×3): qty 0.25

## 2014-03-12 MED ORDER — LEVOFLOXACIN IN D5W 750 MG/150ML IV SOLN
750.0000 mg | INTRAVENOUS | Status: AC
  Administered 2014-03-12 – 2014-03-14 (×2): 750 mg via INTRAVENOUS
  Filled 2014-03-12 (×2): qty 150

## 2014-03-12 MED ORDER — FUROSEMIDE 10 MG/ML IJ SOLN
40.0000 mg | Freq: Two times a day (BID) | INTRAMUSCULAR | Status: DC
  Administered 2014-03-12: 40 mg via INTRAVENOUS
  Filled 2014-03-12: qty 4

## 2014-03-12 MED ORDER — RIFAMPIN 300 MG PO CAPS
300.0000 mg | ORAL_CAPSULE | Freq: Two times a day (BID) | ORAL | Status: DC
  Administered 2014-03-12 – 2014-03-16 (×9): 300 mg via ORAL
  Filled 2014-03-12 (×10): qty 1

## 2014-03-12 MED ORDER — CALCIUM CARBONATE ANTACID 500 MG PO CHEW
1.0000 | CHEWABLE_TABLET | Freq: Two times a day (BID) | ORAL | Status: DC | PRN
  Filled 2014-03-12: qty 1

## 2014-03-12 MED ORDER — ATORVASTATIN CALCIUM 80 MG PO TABS
80.0000 mg | ORAL_TABLET | Freq: Every day | ORAL | Status: DC
  Administered 2014-03-12 – 2014-03-15 (×4): 80 mg via ORAL
  Filled 2014-03-12 (×5): qty 1

## 2014-03-12 MED ORDER — ALBUTEROL SULFATE (2.5 MG/3ML) 0.083% IN NEBU: 3.0000 mL | INHALATION_SOLUTION | RESPIRATORY_TRACT | Status: DC | PRN

## 2014-03-12 MED ORDER — TRAMADOL HCL 50 MG PO TABS
50.0000 mg | ORAL_TABLET | Freq: Two times a day (BID) | ORAL | Status: DC | PRN
  Administered 2014-03-14: 50 mg via ORAL
  Filled 2014-03-12: qty 1

## 2014-03-12 MED ORDER — HYDRALAZINE HCL 50 MG PO TABS
100.0000 mg | ORAL_TABLET | Freq: Three times a day (TID) | ORAL | Status: DC
  Administered 2014-03-13 – 2014-03-16 (×10): 100 mg via ORAL
  Filled 2014-03-12 (×14): qty 2

## 2014-03-12 MED ORDER — LEVOFLOXACIN IN D5W 500 MG/100ML IV SOLN
500.0000 mg | Freq: Once | INTRAVENOUS | Status: AC
  Administered 2014-03-12: 500 mg via INTRAVENOUS
  Filled 2014-03-12: qty 100

## 2014-03-12 MED ORDER — ASPIRIN 81 MG PO CHEW
81.0000 mg | CHEWABLE_TABLET | Freq: Every day | ORAL | Status: DC
  Administered 2014-03-12 – 2014-03-16 (×5): 81 mg via ORAL
  Filled 2014-03-12 (×5): qty 1

## 2014-03-12 MED ORDER — INSULIN DETEMIR 100 UNIT/ML FLEXPEN: 25.0000 [IU] | PEN_INJECTOR | Freq: Every day | SUBCUTANEOUS | Status: DC

## 2014-03-12 MED ORDER — FERROUS SULFATE 325 (65 FE) MG PO TABS
325.0000 mg | ORAL_TABLET | Freq: Two times a day (BID) | ORAL | Status: DC
  Administered 2014-03-12 – 2014-03-16 (×9): 325 mg via ORAL
  Filled 2014-03-12 (×10): qty 1

## 2014-03-12 MED ORDER — FUROSEMIDE 10 MG/ML IJ SOLN
60.0000 mg | Freq: Once | INTRAMUSCULAR | Status: AC
  Administered 2014-03-12: 60 mg via INTRAVENOUS
  Filled 2014-03-12: qty 6

## 2014-03-12 MED ORDER — SODIUM CHLORIDE 0.9 % IV SOLN: 250.0000 mL | INTRAVENOUS | Status: DC | PRN

## 2014-03-12 MED ORDER — HYDRALAZINE HCL 50 MG PO TABS
75.0000 mg | ORAL_TABLET | Freq: Three times a day (TID) | ORAL | Status: DC
  Administered 2014-03-12: 75 mg via ORAL
  Filled 2014-03-12 (×3): qty 1

## 2014-03-12 MED ORDER — SODIUM CHLORIDE 0.9 % IJ SOLN
3.0000 mL | Freq: Two times a day (BID) | INTRAMUSCULAR | Status: DC
  Administered 2014-03-12 – 2014-03-15 (×2): 3 mL via INTRAVENOUS

## 2014-03-12 MED ORDER — ENOXAPARIN SODIUM 30 MG/0.3ML ~~LOC~~ SOLN
30.0000 mg | SUBCUTANEOUS | Status: DC
  Administered 2014-03-12 – 2014-03-16 (×5): 30 mg via SUBCUTANEOUS
  Filled 2014-03-12 (×5): qty 0.3

## 2014-03-12 MED ORDER — HYDRALAZINE HCL 20 MG/ML IJ SOLN
5.0000 mg | Freq: Four times a day (QID) | INTRAMUSCULAR | Status: DC | PRN
  Administered 2014-03-12 (×2): 5 mg via INTRAVENOUS
  Filled 2014-03-12 (×2): qty 1

## 2014-03-12 MED ORDER — CLONIDINE HCL 0.1 MG/24HR TD PTWK
0.1000 mg | MEDICATED_PATCH | TRANSDERMAL | Status: DC
  Administered 2014-03-12: 0.1 mg via TRANSDERMAL
  Filled 2014-03-12: qty 1

## 2014-03-12 MED ORDER — INSULIN ASPART 100 UNIT/ML ~~LOC~~ SOLN: 0.0000 [IU] | Freq: Every day | SUBCUTANEOUS | Status: DC

## 2014-03-12 MED ORDER — SODIUM CHLORIDE 0.9 % IV SOLN
600.0000 mg | INTRAVENOUS | Status: DC
  Administered 2014-03-13: 600 mg via INTRAVENOUS
  Filled 2014-03-12: qty 12

## 2014-03-12 MED ORDER — FUROSEMIDE 10 MG/ML IJ SOLN
80.0000 mg | Freq: Two times a day (BID) | INTRAMUSCULAR | Status: DC
  Administered 2014-03-12 – 2014-03-14 (×4): 80 mg via INTRAVENOUS
  Filled 2014-03-12 (×6): qty 8

## 2014-03-12 MED ORDER — SODIUM CHLORIDE 0.9 % IJ SOLN
3.0000 mL | Freq: Two times a day (BID) | INTRAMUSCULAR | Status: DC
  Administered 2014-03-13 – 2014-03-15 (×3): 3 mL via INTRAVENOUS

## 2014-03-12 MED ORDER — METOPROLOL TARTRATE 50 MG PO TABS
50.0000 mg | ORAL_TABLET | Freq: Two times a day (BID) | ORAL | Status: DC
  Administered 2014-03-12 – 2014-03-16 (×9): 50 mg via ORAL
  Filled 2014-03-12 (×10): qty 1

## 2014-03-12 MED ORDER — SODIUM CHLORIDE 0.9 % IJ SOLN: 3.0000 mL | INTRAMUSCULAR | Status: DC | PRN

## 2014-03-12 MED ORDER — SODIUM POLYSTYRENE SULFONATE 15 GM/60ML PO SUSP
45.0000 g | Freq: Once | ORAL | Status: AC
  Administered 2014-03-12: 45 g via ORAL
  Filled 2014-03-12: qty 180

## 2014-03-12 NOTE — Progress Notes (Signed)
Pt admitted to the unit. Placed on contact precautions for previous positive MRSA. Pt is stable, alert and oriented per baseline. Oriented to room, staff, and call bell. Educated to call for any assistance. Bed in lowest position, call bell within reach- will continue to monitor.

## 2014-03-12 NOTE — Evaluation (Signed)
Occupational Therapy Evaluation Patient Details Name: [name] MRN: [id number] DOB: [birth year] Today's Date: 03/12/2014    History of Present Illness Pt is a 64 y.o. Male admitted 03/11/14 from SNF ([facility]) with wound infection in his right groin. Per chart, pt has PICC line for IV antibiotics. PMH: s/p aorta bifemoral graft (01/03/14), admitted with fever and found to have absvess overlying Rt femoral graft (02/12/14), I&D (02/15/14), s/p gracilis muscle flap procedure to close R groin wound (02/21/14) and d/c on long term antiobiotics to SNF (03/03/14).    Clinical Impression   PTA pt was at SNF [facility]) and he reports that he needed assistance with bathing and dressing and primarily used w/c to get around. Pt plans to d/c back to SNF and would benefit from continued therapy there due to decreased strength and deconditioning. All further OT needs will be met in next venue of care.      Follow Up Recommendations  SNF;Supervision/Assistance - 24 hour    Equipment Recommendations  None recommended by OT    Recommendations for Other Services       Precautions / Restrictions Precautions Precautions: Fall Restrictions Weight Bearing Restrictions: No      Mobility Bed Mobility               General bed mobility comments: Pt sitting at EOB when OT returned  Transfers Overall transfer level: Needs assistance Equipment used: Rolling walker (2 wheeled) Transfers: Sit to/from [facility] Sit to Stand: Min guard Stand pivot transfers: Min guard       General transfer comment: Min guard for safety. No physical assist needed.          ADL Overall ADL's : Needs assistance/impaired Eating/Feeding: Independent;Sitting   Grooming: Sitting;Set up;Wash/dry hands   Upper Body Bathing: Set up;Sitting   Lower Body Bathing: Minimal assistance;Sit to/from stand   Upper Body Dressing : Set up;Sitting   Lower Body Dressing: Minimal assistance;Sit to/from  stand   Toilet Transfer: Min guard;Stand-pivot;RW;BSC   Toileting- Water quality scientist and Hygiene: Sit to/from stand;Min guard         General ADL Comments: Pt fatigues quickly and has decreased motivation.                Pertinent Vitals/Pain Pain Assessment: 0-10 Pain Score: 4  Pain Location: R leg Pain Intervention(s): Limited activity within patient's tolerance;Monitored during session;Repositioned     Hand Dominance Right   Extremity/Trunk Assessment Upper Extremity Assessment Upper Extremity Assessment: Overall WFL for tasks assessed   Lower Extremity Assessment Lower Extremity Assessment: Generalized weakness   Cervical / Trunk Assessment Cervical / Trunk Assessment: Normal   Communication Communication Communication: No difficulties (pt mumbles)   Cognition Arousal/Alertness: Awake/alert Behavior During Therapy: WFL for tasks assessed/performed Overall Cognitive Status: History of cognitive impairments - at baseline                 General Comments: Family reports pt at baseline.               Home Living Family/patient expects to be discharged to:: Skilled nursing facility                                 Additional Comments: pt from [facility] and plans to return      Prior Functioning/Environment Level of Independence: Needs assistance  Gait / Transfers Assistance Needed: pt reports that he transfers to w/c with RW and  pushes his w/c with his UEs. Pt reports he does not ambulate ADL's / Homemaking Assistance Needed: Pt reports that he has assistance for bathing and dressing at SNF        OT Diagnosis: Generalized weakness;Acute pain    End of Session Equipment Utilized During Treatment: Gait belt;Rolling walker  Activity Tolerance: Patient limited by fatigue Patient left: with call bell/phone within reach;with family/visitor present;Other (comment) (sitting EOB)   Time: 8466-5993 OT Time Calculation (min): 28  min Charges:  OT General Charges $OT Visit: 1 Procedure OT Evaluation $Initial OT Evaluation Tier I: 1 Procedure OT Treatments $Therapeutic Activity: 8-22 mins G-Codes: OT G-codes **NOT FOR INPATIENT CLASS** Functional Assessment Tool Used: clinical judgment Functional Limitation: Self care Self Care Current Status (T7017): At least 20 percent but less than 40 percent impaired, limited or restricted Self Care Goal Status (B9390): At least 20 percent but less than 40 percent impaired, limited or restricted Self Care Discharge Status [phone]): At least 20 percent but less than 40 percent impaired, limited or restricted  Juluis Rainier 03/12/2014, 6:25 PM  Cyndie Chime, OTR/L Occupational Therapist [phone] (pager)

## 2014-03-12 NOTE — ED Notes (Signed)
Report attempted, RN not available, she will call back in 10 min.

## 2014-03-12 NOTE — Progress Notes (Signed)
*  PRELIMINARY RESULTS* Vascular Ultrasound Right Lower Extremity Arterial Duplex has been completed.   Study was technically limited due to edema, poor patient cooperation, and involuntary movements. Patient has a history of an aorto-bifemoral bypass graft, however I was not able to visualize this due to technical limitations. There is significant edema/ interstitial fluid throughout the right lower extremity, making visualization of vasculature very limited. There is dampened monophasic flow visualized in the right posterior tibial and dorsalis pedis arteries.    Bilateral lower extremity venous duplex completed. Study was very difficult due to significant edema, poor patient cooperation, and involuntary movements. Visualized veins of bilateral lower extremities are negative for deep vein thrombosis. There is no evidence of Baker's cyst bilaterally.   Incidental finding: There is a 14 x 11cm heterogenous area of the right groin with fluid loculations. This area does not vascularized and cannot be connected to any surrounding vasculature. Suggestive of possible abscess versus etiology unknown.   03/12/2014 6:21 PM  Gertie FeyMichelle Seraj Dunnam, RVT, RDCS, RDMS

## 2014-03-12 NOTE — Progress Notes (Addendum)
ANTIBIOTIC CONSULT NOTE - INITIAL  Pharmacy Consult for daptomycin and levaquin Indication: MRSA groin infection, PNA/UTI  Allergies  Allergen Reactions  . Morphine And Related     Pt felt flushed and was diaphoretic     Patient Measurements: Height:  (165.1 cm) Weight: 214 lb 15.2 oz (97.5 kg) IBW/kg (Calculated) : 61.5  Vital Signs: Temp: 98.2 F (36.8 C) (01/27 0432) Temp Source: Oral (01/27 0549) BP: 186/83 mmHg (01/27 0852) Pulse Rate: 70 (01/27 0549) Intake/Output from previous day: 01/26 0701 - 01/27 0700 In: -  Out: 600 [Urine:600] Intake/Output from this shift: Total I/O In: 0  Out: 325 [Urine:325]  Labs:  Recent Labs  03/11/14 2225 03/12/14 0502  WBC 8.8 8.6  HGB 7.5* 7.5*  PLT 310 306  CREATININE 2.68* 2.88*   Estimated Creatinine Clearance: 27.8 mL/min (by C-G formula based on Cr of 2.88). No results for input(s): VANCOTROUGH, VANCOPEAK, VANCORANDOM, GENTTROUGH, GENTPEAK, GENTRANDOM, TOBRATROUGH, TOBRAPEAK, TOBRARND, AMIKACINPEAK, AMIKACINTROU, AMIKACIN in the last 72 hours.   Microbiology: Recent Results (from the past 720 hour(s))  Blood culture (routine x 2)     Status: None   Collection Time: 02/11/14  2:29 PM  Result Value Ref Range Status   Specimen Description BLOOD RIGHT HAND  Final   Special Requests BOTTLES DRAWN AEROBIC AND ANAEROBIC 4CC  Final   Culture NO GROWTH 5 DAYS  Final   Report Status 02/16/2014 FINAL  Final  Blood culture (routine x 2)     Status: None   Collection Time: 02/11/14  2:33 PM  Result Value Ref Range Status   Specimen Description BLOOD RIGHT ANTECUBITAL  Final   Special Requests BOTTLES DRAWN AEROBIC ONLY 6CC  Final   Culture NO GROWTH 5 DAYS  Final   Report Status 02/16/2014 FINAL  Final  Urine culture     Status: None   Collection Time: 02/11/14  2:56 PM  Result Value Ref Range Status   Specimen Description URINE, CLEAN CATCH  Final   Special Requests NONE  Final   Colony Count NO GROWTH Performed  at [facility]   Final   Culture NO GROWTH Performed at [facility]   Final   Report Status 02/12/2014 FINAL  Final  MRSA PCR Screening     Status: None   Collection Time: 02/11/14  6:40 PM  Result Value Ref Range Status   MRSA by PCR NEGATIVE NEGATIVE Final    Comment:        The GeneXpert MRSA Assay (FDA approved for NASAL specimens only), is one component of a comprehensive MRSA colonization surveillance program. It is not intended to diagnose MRSA infection nor to guide or monitor treatment for MRSA infections.   Wound culture     Status: None   Collection Time: 02/14/14 10:08 AM  Result Value Ref Range Status   Specimen Description WOUND GROIN RIGHT  Final   Special Requests NONE  Final   Gram Stain   Final    NO WBC SEEN NO SQUAMOUS EPITHELIAL CELLS SEEN NO ORGANISMS SEEN Performed at [facility]    Culture   Final    NO GROWTH 3 DAYS Performed at [facility]    Report Status 02/17/2014 FINAL  Final  Body fluid culture     Status: None   Collection Time: 02/15/14  9:49 AM  Result Value Ref Range Status   Specimen Description FLUID RIGHT DRAINAGE GROIN  Final   Special Requests NONE  Final  Gram Stain   Final    ABUNDANT WBC PRESENT, PREDOMINANTLY PMN RARE GRAM POSITIVE COCCI IN PAIRS [facility]formed at [facility]    Culture   Final    MODERATE METHICILLIN RESISTANT STAPHYLOCOCCUS AUREUS Note: RIFAMPIN AND GENTAMICIN SHOULD NOT BE USED AS SINGLE DRUGS FOR TREATMENT OF STAPH INFECTIONS. This organism DOES NOT demonstrate inducible Clindamycin resistance in vitro. CRITICAL RESULT CALLED TO, READ BACK BY AND VERIFIED WITH: CAROL@8 :58AM ON  02/17/13 BY DANTS Performed at [facility]    Report Status 02/17/2014 FINAL  Final   Organism ID, Bacteria METHICILLIN RESISTANT STAPHYLOCOCCUS AUREUS  Final      Susceptibility   Methicillin resistant staphylococcus aureus - MIC*    CLINDAMYCIN <=0.25 SENSITIVE Sensitive      ERYTHROMYCIN >=8 RESISTANT Resistant     GENTAMICIN <=0.5 SENSITIVE Sensitive     LEVOFLOXACIN >=8 RESISTANT Resistant     OXACILLIN >=4 RESISTANT Resistant     PENICILLIN >=0.5 RESISTANT Resistant     RIFAMPIN <=0.5 SENSITIVE Sensitive     TRIMETH/SULFA >=320 RESISTANT Resistant     VANCOMYCIN 1 SENSITIVE Sensitive     TETRACYCLINE <=1 SENSITIVE Sensitive     * MODERATE METHICILLIN RESISTANT STAPHYLOCOCCUS AUREUS  MRSA PCR Screening     Status: None   Collection Time: 02/21/14  4:11 AM  Result Value Ref Range Status   MRSA by PCR NEGATIVE NEGATIVE Final    Comment:        The GeneXpert MRSA Assay (FDA approved for NASAL specimens only), is one component of a comprehensive MRSA colonization surveillance program. It is not intended to diagnose MRSA infection nor to guide or monitor treatment for MRSA infections.   Clostridium Difficile by PCR     Status: None   Collection Time: 03/03/14  5:31 PM  Result Value Ref Range Status   C difficile by pcr NEGATIVE NEGATIVE Final    Medical History: Past Medical History  Diagnosis Date  . Arteriosclerotic cardiovascular disease (ASCVD)     CABG in 2003 for 3 VD; 07/2007: Stress nuclear-inferior infarction without ischemia; normal EF by echo in 06/2005  . Diabetes mellitus type II   . Hyperlipidemia   . Degenerative joint disease     Left knee  . Cerebrovascular disease 2003    Carotid ultrasound in 2010: Patent right CEA site, 60-79% left internal carotid artery  . Peripheral vascular disease 2007    aortobifemoral BPG-2007  . Anemia   . Peripheral neuropathy   . Hypertension   . Sick sinus syndrome 1996    permanent pacemaker-1996; generator change in 2010  . Chronic obstructive pulmonary disease   . Obesity   . Obstructive sleep apnea   . Benign prostatic hypertrophy   . Gastroesophageal reflux disease   . Tobacco abuse, in remission     50 pack years total consumption; Quit in 2007    Assessment: Patient is a 64  y.o M with hx of thrombectomy of the left femoral limb and subsequently developed right groin abscess with suspected right femoral graft infection.  S/p  I&D on 02/15/14 and cultures grew out MRSA.  He was on vancomycin but was switched to daptomycin d/t acute renal failure and acummulation of vancomycin.  He was discharged on 1/18 to [facility] with instructions to treat with daptomycin and rifampin for 6 weeks (start date 1/15).  Per [facility], PTA daptomycin 600mg  q48h with last dose given on 1/25 at 9PM and rifampin 300mg  PO q12h with last  dose given on 1/26. To resume PTA abx for groin infection and start levaquin for suspected PNA/UTI. - CK on 1/27 was 127 - scr 2.88 (crcl~28)  1/15 dapto (6 weeks of abx from 1/15, end date 2/19 )>>   ++ CK on 1/27 was 127 1/11 rifampin>> 1/27 LVQ>>    Plan:  - daptomycin  IV q48h - levaquin  IV q48h - weekly CK - f/u renal function  Samir Ishaq P 03/12/2014,11:23 AM   Adden (03/13/14 at 1106): Patient is currently on PTA daptomycin regimen of  IV q48h.  Since he has skin and soft tissue infection, will adjust dose  IV q48h.  Dorna Leitz, PharmD, BCPS

## 2014-03-12 NOTE — Progress Notes (Signed)
CRITICAL VALUE ALERT  Critical value received:  CK-MB  Date of notification:  03/12/14  Time of notification:  1740  Critical value read back:Yes.    Nurse who received alert:  Lysbeth GalasJesse Alfredia Desanctis, RN  MD notified (1st page):  Osvaldo ShipperGokul Krishnan  Time of first page:  1741  MD notified (2nd page):  Time of second page:  Responding MD:    Time MD responded:

## 2014-03-12 NOTE — Clinical Social Work Psychosocial (Signed)
Clinical Social Work Department BRIEF PSYCHOSOCIAL ASSESSMENT 03/12/2014  Patient:  [name], [name]     Account Number:  [id number]     Admit date:  03/11/2014  Clinical Social Worker:  Lovey Newcomer  Date/Time:  03/12/2014 11:09 AM  Referred by:  Physician  Date Referred:  03/12/2014 Referred for  SNF Placement   Other Referral:   NA   Interview type:  Patient Other interview type:   Patient alert and oriented at time of assessment.    PSYCHOSOCIAL DATA Living Status:  WIFE Admitted from facility:  [facility] Level of care:  [place] Primary support name:  Delores Primary support relationship to patient:  SPOUSE Degree of support available:   Support is good.    CURRENT CONCERNS Current Concerns  Post-Acute Placement   Other Concerns:   NA    SOCIAL WORK ASSESSMENT / PLAN CSW met with patient at bedside to complete assessment. Patient sitting upright on EOB. CSW introduced self and explained CSW's role. Patient confirms that he was admitted from [facility] and plans to return to the facility once discharged from the hospital. Patient ultimately plans to return home with wife once he is well enough. CSW will assist with DC once medically stable.   Assessment/plan status:  Psychosocial Support/Ongoing Assessment of Needs Other assessment/ plan:   Complete Fl2, Fax, PASRR, request facility begin [facility] Auth.   Information/referral to community resources:   CSW contact information given.    PATIENT'S/FAMILY'S RESPONSE TO PLAN OF CARE: Patient plans to return to [facility] once medically stable. CSW will assist.       Liz Beach MSW, [place], [place], [phone]

## 2014-03-12 NOTE — H&P (Addendum)
[name] is an 64 y.o. male.    Pcp: Vic Blackbird   Chief Complaint: ? pneuumonia HPI: 64 yo male with dm2, neuropathy, CAD s/p CABG, pvd s/p  01/03/2014 1. Thrombectomy of left limb of aortofemoral bypass graft 2. Extensive endarterectomy of the left common femoral artery and deep femoral artery 3. Bypass from the left limb of the aortofemoral bypass graft to the deep femoral artery using 8 mm Dacron graft 4. Intraoperative arteriogram of the left lower extremity 5. Thrombectomy of the right limb of the aortofemoral bypass graft 6. Endarterectomy of the right common femoral artery 7. Dacron patch angioplasty of distal right limb of aortofemoral bypass graft w later development of R groin infection s/p I and D on 02/15/2014 which grew out moderate MRSA, initially tx with vanco and cefepime and then later converted to daptomycin and rifampin per ID recommendations.  Pt had gracilis muscle flap R groin 02/21/2014.  Pt later discharge to SN[facility] for continuation of abx therapy.  Earlier today, pt apparently had CXR due to cough (dry), and CXR was read as pneumonia.  Pt apparently also had slightly high potassium and sent to ED for evaluation.  In ED CXR was read as vascular congestion and bil pleural effusion.  Pt was noted to have worsening anemia as well as some right distal lower ext swelling.  Pt will be admitted for vascular congestion, evaluation of right lower ext swelling as well as anemia.   Past Medical History  Diagnosis Date  . Arteriosclerotic cardiovascular disease (ASCVD)     CABG in 2003 for 3 VD; 07/2007: Stress nuclear-inferior infarction without ischemia; normal EF by echo in 06/2005  . Diabetes mellitus type II   . Hyperlipidemia   . Degenerative joint disease     Left knee  . Cerebrovascular disease 2003    Carotid ultrasound in 2010: Patent right CEA site, 60-79% left internal carotid artery  . Peripheral vascular disease 2007    aortobifemoral BPG-2007  .  Anemia   . Peripheral neuropathy   . Hypertension   . Sick sinus syndrome 1996    permanent pacemaker-1996; generator change in 2010  . Chronic obstructive pulmonary disease   . Obesity   . Obstructive sleep apnea   . Benign prostatic hypertrophy   . Gastroesophageal reflux disease   . Tobacco abuse, in remission     50 pack years total consumption; Quit in 2007    Past Surgical History  Procedure Laterality Date  . Coronary artery bypass graft  2003    three-vessel disease  . Iliac artery stent      Percutaneous intervention-right external iliac; unclear whether stent was placed  . Pacemaker insertion  Phillips DR dual-chamber device, serial number [id number]   . Carotid endarterectomy  2003    Right  . Pacemaker generator change  2010     [employer] Jude Accent DR dual-chamber device, serial number [id number]   . Aorta - bilateral femoral artery bypass graft  2007  . Femoral-popliteal bypass graft  1998    bilateral  . Colonoscopy  2012    Negative screening study.  . Ileocolonoscopy  02/20/2007    QJJ:HERDEY rectum, colon, and terminal ileum  . Femoral-femoral bypass graft Bilateral 01/03/2014    Procedure: BYPASS GRAFT RIGHT FEMORAL-LEFT FEMORAL ARTERY ;  Surgeon: Angelia Mould, MD;  Location: [place];  Service: Vascular;  Laterality: Bilateral;  . Endarterectomy femoral Left 01/03/2014    Procedure:  ENDARTERECTOMY FEMORAL;  Surgeon: Angelia Mould, MD;  Location: [place];  Service: Vascular;  Laterality: Left;  . Intraoperative arteriogram Left 01/03/2014    Procedure: INTRA OPERATIVE ARTERIOGRAM, LEFT LOWER LEG;  Surgeon: Angelia Mould, MD;  Location: [place];  Service: Vascular;  Laterality: Left;  . Abdominal angiogram N/A 01/02/2014    Procedure: ABDOMINAL ANGIOGRAM;  Surgeon: Conrad Fowlerville, MD;  Location: [facility] CATH LAB;  Service: Cardiovascular;  Laterality: N/A;  . Peripheral vascular catheterization  01/02/2014    Procedure: LOWER EXTREMITY  ANGIOGRAPHY;  Surgeon: Conrad Russell, MD;  Location: [facility] CATH LAB;  Service: Cardiovascular;;  . Arch aortogram  01/02/2014    Procedure: ARCH AORTOGRAM;  Surgeon: Conrad Wilson, MD;  Location[facility] C[facility] LAB;  Service: Cardiovascular;;  . Wound exploration Right 02/15/2014    Procedure: Incision and Drainage Right Groin;  Surgeon: Elam Dutch, MD;  Location: [place];  Service: Vascular;  Laterality: Right;  . Left heart catheterization with coronary/graft angiogram N/A 02/20/2014    Procedure: LEFT HEART CATHETERIZATION WITH Beatrix Fetters;  Surgeon: Blane Ohara, MD;  Location: [facility] CATH LAB;  Service: Cardiovascular;  Laterality: N/A;  . Tram Right 02/21/2014    Procedure: RIGHT GRACILIS MUSCLE FLAP TO RIGHT GROIN ;  Surgeon: Irene Limbo, MD;  Location: [place];  Service: Plastics;  Laterality: Right;    Family History  Problem Relation Age of Onset  . Dementia Mother   . Heart attack Father   . Coronary artery disease Father   . Peripheral vascular disease Sister   . Diabetes Brother   . Peripheral vascular disease Brother   . Hypertension Brother   . Coronary artery disease Brother    Social History:  reports that he quit smoking about 9 years ago. His smoking use included Cigarettes. He has a 50 pack-year smoking history. He quit smokeless tobacco use about 18 years ago. He reports that he does not drink alcohol or use illicit drugs.  Allergies:  Allergies  Allergen Reactions  . Morphine And Related     Pt felt flushed and was diaphoretic      (Not in a hospital admission)  Results for orders placed or performed during the hospital encounter of 03/11/14 (from the past 48 hour(s))  CBC     Status: Abnormal   Collection Time: 03/11/14 10:25 PM  Result Value Ref Range   WBC 8.8 4.0 - 10.5 K/uL   RBC 2.97 (L) 4.22 - 5.81 MIL/uL   Hemoglobin 7.5 (L) 13.0 - 17.0 g/dL   HCT 24.6 (L) 39.0 - 52.0 %   MCV 82.8 78.0 - 100.0 fL   MCH 25.3 (L) 26.0 - 34.0 pg   MCHC 30.5  30.0 - 36.0 g/dL   RDW 15.5 11.5 - 15.5 %   Platelets 310 150 - 400 K/uL  Basic metabolic panel     Status: Abnormal   Collection Time: 03/11/14 10:25 PM  Result Value Ref Range   Sodium 138 135 - 145 mmol/L   Potassium 5.1 3.5 - 5.1 mmol/L   Chloride 107 96 - 112 mmol/L   CO2 24 19 - 32 mmol/L   Glucose, Bld 153 (H) 70 - 99 mg/dL   BUN 55 (H) 6 - 23 mg/dL   Creatinine, Ser 2.68 (H) 0.50 - 1.35 mg/dL   Calcium 8.8 8.4 - 10.5 mg/dL   GFR calc non Af Amer 24 (L) >90 mL/min   GFR calc Af Amer 27 (L) >90 mL/min  Comment: (NOTE) The eGFR has been calculated using the CKD EPI equation. This calculation has not been validated in all clinical situations. eGFR's persistently <90 mL/min signify possible Chronic Kidney Disease.    Anion gap 7 5 - 15  BNP (order ONLY if patient complains of dyspnea/SOB AND you have documented it for THIS visit)     Status: Abnormal   Collection Time: 03/11/14 10:25 PM  Result Value Ref Range   B Natriuretic Peptide 2715.9 (H) 0.0 - 100.0 pg/mL  I-stat troponin, ED (not at [facility])     Status: None   Collection Time: 03/11/14 10:29 PM  Result Value Ref Range   Troponin i, poc 0.06 0.00 - 0.08 ng/mL   Comment 3            Comment: Due to the release kinetics of cTnI, a negative result within the first hours of the onset of symptoms does not rule out myocardial infarction with certainty. If myocardial infarction is still suspected, repeat the test at appropriate intervals.   Troponin I     Status: Abnormal   Collection Time: 03/11/14 11:42 PM  Result Value Ref Range   Troponin I 0.09 (H) <0.031 ng/mL    Comment:        PERSISTENTLY INCREASED TROPONIN VALUES IN THE RANGE OF 0.04-0.49 ng/mL CAN BE SEEN IN:       -UNSTABLE ANGINA       -CONGESTIVE HEART FAILURE       -MYOCARDITIS       -CHEST TRAUMA       -ARRYHTHMIAS       -LATE PRESENTING MYOCARDIAL INFARCTION       -COPD   CLINICAL FOLLOW-UP RECOMMENDED.    Dg Chest Port 1 View  03/11/2014    CLINICAL DATA:  Infected stent in the leg. Dialysis patient. Patient has missed 3 dialysis appointment due to lack of access. Concern for fluid overload. Short of breath.  EXAM: PORTABLE CHEST - 1 VIEW  COMPARISON:  02/27/2014  FINDINGS: Postoperative changes in the mediastinum. Cardiac pacemaker. Left PICC catheter. Appliances are unchanged since prior study. Cardiac enlargement with prominent pulmonary vascularity and perihilar infiltration consistent with edema. Small bilateral pleural effusions and basilar atelectasis, greater on the left, and increasing since prior study.  IMPRESSION: Cardiac enlargement with pulmonary vascular congestion and edema. Small bilateral pleural effusions and basilar atelectasis, increasing since prior study.   Electronically Signed   By: Lucienne Capers M.D.   On: 03/11/2014 22:34    Review of Systems  Constitutional: Negative for fever, chills, weight loss, malaise/fatigue and diaphoresis.  HENT: Negative for congestion, ear discharge, ear pain, hearing loss, nosebleeds, sore throat and tinnitus.   Eyes: Negative for blurred vision, double vision, photophobia, pain, discharge and redness.  Respiratory: Positive for cough. Negative for hemoptysis, sputum production, shortness of breath, wheezing and stridor.   Cardiovascular: Negative for chest pain, palpitations, orthopnea, claudication, leg swelling and PND.  Gastrointestinal: Negative for heartburn, nausea, vomiting, abdominal pain, diarrhea, constipation, blood in stool and melena.  Genitourinary: Negative for dysuria, urgency, frequency, hematuria and flank pain.  Musculoskeletal: Negative for myalgias, back pain, joint pain, falls and neck pain.  Skin: Negative for itching and rash.  Neurological: Negative for dizziness, tingling, tremors, sensory change, speech change, focal weakness, seizures, loss of consciousness, weakness and headaches.  Endo/Heme/Allergies: Negative for environmental allergies and  polydipsia. Does not bruise/bleed easily.  Psychiatric/Behavioral: Negative for depression, suicidal ideas, hallucinations, memory loss and substance abuse. The patient is  not nervous/anxious and does not have insomnia.     Blood pressure 186/89, pulse 70, temperature 98.4 F (36.9 C), temperature source Oral, resp. rate 25, SpO2 92 %. Physical Exam  Constitutional: He is oriented to person, place, and time. He appears well-developed and well-nourished.  HENT:  Head: Normocephalic and atraumatic.  Eyes: Conjunctivae and EOM are normal. Pupils are equal, round, and reactive to light. No scleral icterus.  Neck: Normal range of motion. Neck supple. No JVD present. No tracheal deviation present. No thyromegaly present.  Cardiovascular: Normal rate and regular rhythm.  Exam reveals no gallop and no friction rub.   No murmur heard. Midline scar, right upper chest pacer  Respiratory: Effort normal. No respiratory distress. He has no wheezes. He has rales.  Slight decrease in bs at bil bases, slight crackles right base  GI: Soft. Bowel sounds are normal. He exhibits no distension. There is no tenderness. There is no rebound and no guarding.  Musculoskeletal: Normal range of motion. He exhibits no edema or tenderness.  Lymphadenopathy:    He has no cervical adenopathy.  Neurological: He is alert and oriented to person, place, and time. He has normal reflexes. He displays normal reflexes. No cranial nerve deficit. He exhibits normal muscle tone. Coordination normal.  Skin: Skin is warm and dry. No rash noted. No erythema. No pallor.  + right pedal edema, and slight right distal LE edema, good capillary refil  Psychiatric: He has a normal mood and affect. His behavior is normal. Judgment and thought content normal.     Assessment/Plan Cough, ? Bronchitis Repeat CXR pa and lat Levaquin 510m iv x1  to cover atypicals til results of CXR return  Vascular congestion, ? Mild CHF, bil Pleural  effusion Check cardiac echo Lasix 461miv bid  Hx of R groin abscess w/ MRSA Cont daptomycin and rifampin  Anemia Repeat cbc in am, if hgb<7.5 then transfuse 2 units prbc  CKD stage 4 Check cmp in am  + trop  Likely secondary to renal insufficency Cycle cardiac marker Check echo  Dm2 fsbs ac and qhs, iss  [facility]27/2016, 2:15 AM

## 2014-03-12 NOTE — Progress Notes (Signed)
Patient refused CPAP at this time.

## 2014-03-12 NOTE — Progress Notes (Signed)
[facility] PROGRESS NOTE  [name] ZOX:[id number] DOB: Jun 10, 1950 DOA: 03/11/2014  PCP: Milinda Antis, MD  Brief HPI: 64 year old African-American male was sent over due to worsening edema and drainage from his right groin wound. He was also short of breath. He was recently hospitalized for infection of the right groin area after aortofemoral bypass graft surgery. He was discharged to skilled nursing facility on IV antibiotics.  Past medical history:  Past Medical History  Diagnosis Date  . Arteriosclerotic cardiovascular disease (ASCVD)     CABG in 2003 for 3 VD; 07/2007: Stress nuclear-inferior infarction without ischemia; normal EF by echo in 06/2005  . Diabetes mellitus type II   . Hyperlipidemia   . Degenerative joint disease     Left knee  . Cerebrovascular disease 2003    Carotid ultrasound in 2010: Patent right CEA site, 60-79% left internal carotid artery  . Peripheral vascular disease 2007    aortobifemoral BPG-2007  . Anemia   . Peripheral neuropathy   . Hypertension   . Sick sinus syndrome 1996    permanent pacemaker-1996; generator change in 2010  . Chronic obstructive pulmonary disease   . Obesity   . Obstructive sleep apnea   . Benign prostatic hypertrophy   . Gastroesophageal reflux disease   . Tobacco abuse, in remission     50 pack years total consumption; Quit in 2007    Consultants: None  Procedures:  Venous Doppler and arterial Dopplers of right lower extremity is pending.  Antibiotics: Daptomycin and rifampin for 6 weeks from 02/28/14.  Subjective: Patient complains of swelling of both his legs. Denies any chest pain or shortness of breath currently.  Objective: Vital Signs  Filed Vitals:   03/12/14 0719 03/12/14 0850 03/12/14 0852 03/12/14 1147  BP: 175/72  186/83 184/71  Pulse:      Temp:      TempSrc:      Resp:      Height:      Weight:      SpO2:  98% 97%     Intake/Output Summary (Last 24 hours) at 03/12/14  1427 Last data filed at 03/12/14 1228  Gross per 24 hour  Intake      0 ml  Output   1175 ml  Net  -1175 ml   Filed Weights   03/12/14 0432  Weight: 97.5 kg (214 lb 15.2 oz)    General appearance: alert, cooperative and no distress Head: Normocephalic, without obvious abnormality, atraumatic Resp: clear to auscultation bilaterally Cardio: regular rate and rhythm, S1, S2 normal, no murmur, click, rub or gallop GI: soft, non-tender; bowel sounds normal; no masses,  no organomegaly Extremities: To 3+ pitting edema bilateral lower extremities. Right groin wound was examined. No active drainage was noted. Neurologic: No focal deficits.  Lab Results:  Basic Metabolic Panel:  Recent Labs Lab 03/11/14 2225 03/12/14 0502  NA 138 140  K 5.1 5.3*  CL 107 107  CO2 24 21  GLUCOSE 153* 144*  BUN 55* 53*  CREATININE 2.68* 2.88*  CALCIUM 8.8 8.9   Liver Function Tests:  Recent Labs Lab 03/12/14 0502  AST 22  ALT 17  ALKPHOS 132*  BILITOT 0.6  PROT 7.1  ALBUMIN 2.5*   CBC:  Recent Labs Lab 03/11/14 2225 03/12/14 0502  WBC 8.8 8.6  NEUTROABS  --  5.8  HGB 7.5* 7.5*  HCT 24.6* 25.1*  MCV 82.8 83.4  PLT 310 306   Cardiac Enzymes:  Recent Labs Lab  03/11/14 2342 03/12/14 0502 03/12/14 1105  CKTOTAL  --  127 130  CKMB  --  5.6* 5.9*  TROPONINI 0.09* 0.09* 0.09*   CBG:  Recent Labs Lab 03/12/14 0803 03/12/14 1146  GLUCAP 131* 127*    Studies/Results: Dg Chest Port 1 View  03/11/2014   CLINICAL DATA:  Infected stent in the leg. Dialysis patient. Patient has missed 3 dialysis appointment due to lack of access. Concern for fluid overload. Short of breath.  EXAM: PORTABLE CHEST - 1 VIEW  COMPARISON:  02/27/2014  FINDINGS: Postoperative changes in the mediastinum. Cardiac pacemaker. Left PICC catheter. Appliances are unchanged since prior study. Cardiac enlargement with prominent pulmonary vascularity and perihilar infiltration consistent with edema. Small  bilateral pleural effusions and basilar atelectasis, greater on the left, and increasing since prior study.  IMPRESSION: Cardiac enlargement with pulmonary vascular congestion and edema. Small bilateral pleural effusions and basilar atelectasis, increasing since prior study.   Electronically Signed   By: Burman Nieves M.D.   On: 03/11/2014 22:34    Medications:  Scheduled: . aspirin  81 mg Oral Daily  . atorvastatin  80 mg Oral q1800  . cloNIDine  0.1 mg Transdermal Weekly  . DAPTOmycin (CUBICIN)  IV  600 mg Intravenous Q48H  . enoxaparin (LOVENOX) injection  30 mg Subcutaneous Q24H  . ferrous sulfate  325 mg Oral BID  . furosemide  80 mg Intravenous BID  . hydrALAZINE  75 mg Oral 3 times per day  . insulin aspart  0-5 Units Subcutaneous QHS  . insulin aspart  0-9 Units Subcutaneous TID WC  . insulin detemir  25 Units Subcutaneous QHS  . isosorbide mononitrate  30 mg Oral Daily  . levofloxacin (LEVAQUIN) IV  750 mg Intravenous Q48H  . metoprolol tartrate  50 mg Oral BID  . polyethylene glycol  17 g Oral Daily  . rifampin  300 mg Oral Q12H  . sodium chloride  3 mL Intravenous Q12H  . 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 Lungs appear to be clear at this time. Patient does not appear to be tachypneic or short of breath. No clinical evidence for pulmonary edema.  Pedal edema Significant edema noted on examination. His weight, however, is not much different from what it was last week when he was  discharged. Continue intravenous Lasix for now. Await venous Doppler studies.  Hx of R groin abscess w/ MRSA Cont daptomycin and rifampin. Wound was examined. No active drainage noted. Continue to monitor for now. Can be followed as an outpatient.  Normocytic Anemia Hemoglobin stable.    CKD stage 4 With hyperkalemia. Give Kayexalate. Check metabolic panel in the morning. He did have acute renal failure during his last hospitalization as well, which was thought to be secondary to contrast-induced nephropathy. Renal function actually better from what it was during that hospitalization. Monitor urine output.  Minimally elevated troponin  Likely secondary to renal insufficiency. No significant elevation noted. He had a cardiac catheterization on January 7, which showed patent grafts. Considering this recent evaluation no further testing is warranted.  Type 2 diabetes mellitus  Continue sliding scale coverage.  OSA Cpap  DVT Prophylaxis: Lovenox    Code Status: Full code  Family Communication: Discussed with the patient  Disposition Plan: Return to SNF when better. PT/OT.    LOS: 1 day   [facility] MonroeKRISHNAN,Dalten Ambrosino  [facility] Pager [phone] 03/12/2014, 2:27 PM  If 7PM-7AM, please contact night-coverage at [email or web address], password [email or web address]

## 2014-03-12 NOTE — Progress Notes (Signed)
Patient bed changed from 2west to 5west. Received report from Phillips GroutYasemia Lebron. Awaiting patient at this time

## 2014-03-13 DIAGNOSIS — Z6834 Body mass index (BMI) 34.0-34.9, adult: Secondary | ICD-10-CM | POA: Diagnosis not present

## 2014-03-13 DIAGNOSIS — Z794 Long term (current) use of insulin: Secondary | ICD-10-CM | POA: Diagnosis not present

## 2014-03-13 DIAGNOSIS — Z950 Presence of cardiac pacemaker: Secondary | ICD-10-CM | POA: Diagnosis not present

## 2014-03-13 DIAGNOSIS — E1151 Type 2 diabetes mellitus with diabetic peripheral angiopathy without gangrene: Secondary | ICD-10-CM | POA: Diagnosis present

## 2014-03-13 DIAGNOSIS — K219 Gastro-esophageal reflux disease without esophagitis: Secondary | ICD-10-CM | POA: Diagnosis present

## 2014-03-13 DIAGNOSIS — Z8614 Personal history of Methicillin resistant Staphylococcus aureus infection: Secondary | ICD-10-CM | POA: Diagnosis not present

## 2014-03-13 DIAGNOSIS — L7622 Postprocedural hemorrhage and hematoma of skin and subcutaneous tissue following other procedure: Secondary | ICD-10-CM | POA: Diagnosis present

## 2014-03-13 DIAGNOSIS — Y832 Surgical operation with anastomosis, bypass or graft as the cause of abnormal reaction of the patient, or of later complication, without mention of misadventure at the time of the procedure: Secondary | ICD-10-CM | POA: Diagnosis present

## 2014-03-13 DIAGNOSIS — E11649 Type 2 diabetes mellitus with hypoglycemia without coma: Secondary | ICD-10-CM | POA: Diagnosis present

## 2014-03-13 DIAGNOSIS — E875 Hyperkalemia: Secondary | ICD-10-CM | POA: Diagnosis present

## 2014-03-13 DIAGNOSIS — Z992 Dependence on renal dialysis: Secondary | ICD-10-CM | POA: Diagnosis not present

## 2014-03-13 DIAGNOSIS — Z951 Presence of aortocoronary bypass graft: Secondary | ICD-10-CM | POA: Diagnosis not present

## 2014-03-13 DIAGNOSIS — M179 Osteoarthritis of knee, unspecified: Secondary | ICD-10-CM | POA: Diagnosis present

## 2014-03-13 DIAGNOSIS — L03115 Cellulitis of right lower limb: Secondary | ICD-10-CM | POA: Diagnosis present

## 2014-03-13 DIAGNOSIS — Z8249 Family history of ischemic heart disease and other diseases of the circulatory system: Secondary | ICD-10-CM | POA: Diagnosis not present

## 2014-03-13 DIAGNOSIS — I5033 Acute on chronic diastolic (congestive) heart failure: Secondary | ICD-10-CM | POA: Diagnosis present

## 2014-03-13 DIAGNOSIS — Z885 Allergy status to narcotic agent status: Secondary | ICD-10-CM | POA: Diagnosis not present

## 2014-03-13 DIAGNOSIS — I495 Sick sinus syndrome: Secondary | ICD-10-CM | POA: Diagnosis present

## 2014-03-13 DIAGNOSIS — E1140 Type 2 diabetes mellitus with diabetic neuropathy, unspecified: Secondary | ICD-10-CM | POA: Diagnosis present

## 2014-03-13 DIAGNOSIS — G4733 Obstructive sleep apnea (adult) (pediatric): Secondary | ICD-10-CM | POA: Diagnosis present

## 2014-03-13 DIAGNOSIS — N400 Enlarged prostate without lower urinary tract symptoms: Secondary | ICD-10-CM | POA: Diagnosis present

## 2014-03-13 DIAGNOSIS — N184 Chronic kidney disease, stage 4 (severe): Secondary | ICD-10-CM | POA: Diagnosis present

## 2014-03-13 DIAGNOSIS — E785 Hyperlipidemia, unspecified: Secondary | ICD-10-CM | POA: Diagnosis present

## 2014-03-13 DIAGNOSIS — I70201 Unspecified atherosclerosis of native arteries of extremities, right leg: Secondary | ICD-10-CM | POA: Diagnosis present

## 2014-03-13 DIAGNOSIS — I129 Hypertensive chronic kidney disease with stage 1 through stage 4 chronic kidney disease, or unspecified chronic kidney disease: Secondary | ICD-10-CM | POA: Diagnosis present

## 2014-03-13 DIAGNOSIS — D649 Anemia, unspecified: Secondary | ICD-10-CM | POA: Diagnosis present

## 2014-03-13 DIAGNOSIS — I2510 Atherosclerotic heart disease of native coronary artery without angina pectoris: Secondary | ICD-10-CM | POA: Diagnosis present

## 2014-03-13 DIAGNOSIS — J209 Acute bronchitis, unspecified: Secondary | ICD-10-CM | POA: Diagnosis present

## 2014-03-13 DIAGNOSIS — Z87891 Personal history of nicotine dependence: Secondary | ICD-10-CM | POA: Diagnosis not present

## 2014-03-13 DIAGNOSIS — R0989 Other specified symptoms and signs involving the circulatory and respiratory systems: Secondary | ICD-10-CM | POA: Diagnosis present

## 2014-03-13 DIAGNOSIS — E669 Obesity, unspecified: Secondary | ICD-10-CM | POA: Diagnosis present

## 2014-03-13 LAB — GLUCOSE, CAPILLARY
GLUCOSE-CAPILLARY: 132 mg/dL — AB (ref 70–99)
Glucose-Capillary: 100 mg/dL — ABNORMAL HIGH (ref 70–99)
Glucose-Capillary: 127 mg/dL — ABNORMAL HIGH (ref 70–99)
Glucose-Capillary: 137 mg/dL — ABNORMAL HIGH (ref 70–99)

## 2014-03-13 LAB — BASIC METABOLIC PANEL
Anion gap: 5 (ref 5–15)
BUN: 51 mg/dL — ABNORMAL HIGH (ref 6–23)
CO2: 28 mmol/L (ref 19–32)
CREATININE: 2.81 mg/dL — AB (ref 0.50–1.35)
Calcium: 8.9 mg/dL (ref 8.4–10.5)
Chloride: 108 mmol/L (ref 96–112)
GFR calc non Af Amer: 22 mL/min — ABNORMAL LOW (ref >90)
GFR, EST AFRICAN AMERICAN: 26 mL/min — AB (ref >90)
Glucose, Bld: 125 mg/dL — ABNORMAL HIGH (ref 70–99)
Potassium: 4.6 mmol/L (ref 3.5–5.1)
SODIUM: 141 mmol/L (ref 135–145)

## 2014-03-13 LAB — CBC
HCT: 24.1 % — ABNORMAL LOW (ref 39.0–52.0)
HEMOGLOBIN: 7.3 g/dL — AB (ref 13.0–17.0)
MCH: 25.8 pg — AB (ref 26.0–34.0)
MCHC: 30.3 g/dL (ref 30.0–36.0)
MCV: 85.2 fL (ref 78.0–100.0)
PLATELETS: 290 10*3/uL (ref 150–400)
RBC: 2.83 MIL/uL — ABNORMAL LOW (ref 4.22–5.81)
RDW: 15.6 % — ABNORMAL HIGH (ref 11.5–15.5)
WBC: 7.7 10*3/uL (ref 4.0–10.5)

## 2014-03-13 MED ORDER — SODIUM CHLORIDE 0.9 % IV SOLN
500.0000 mg | INTRAVENOUS | Status: DC
  Administered 2014-03-15: 500 mg via INTRAVENOUS
  Filled 2014-03-13 (×3): qty 10

## 2014-03-13 NOTE — Progress Notes (Signed)
[facility] PROGRESS NOTE  [name] AVW:[id number] DOB: 06/26/1950 DOA: 03/11/2014  PCP: [name], [name], MD  Brief HPI: 64 year old African-American male was sent over due to worsening edema and drainage from his right groin wound. He was also short of breath. He was recently hospitalized for infection of the right groin area after aortofemoral bypass graft surgery. He was discharged to skilled nursing facility on IV antibiotics. He was admitted for further management.  Past medical history:  Past Medical History  Diagnosis Date  . Arteriosclerotic cardiovascular disease (ASCVD)     CABG in 2003 for 3 VD; 07/2007: Stress nuclear-inferior infarction without ischemia; normal EF by echo in 06/2005  . Diabetes mellitus type II   . Hyperlipidemia   . Degenerative joint disease     Left knee  . Cerebrovascular disease 2003    Carotid ultrasound in 2010: Patent right CEA site, 60-79% left internal carotid artery  . Peripheral vascular disease 2007    aortobifemoral BPG-2007  . Anemia   . Peripheral neuropathy   . Hypertension   . Sick sinus syndrome 1996    permanent pacemaker-1996; generator change in 2010  . Chronic obstructive pulmonary disease   . Obesity   . Obstructive sleep apnea   . Benign prostatic hypertrophy   . Gastroesophageal reflux disease   . Tobacco abuse, in remission     50 pack years total consumption; Quit in 2007    Consultants: None  Procedures:  Venous Doppler of lower extremities  No DVT noted.   Arterial Dopplers of right lower extremity Fluid collection noted in the right groin area. See report.  Antibiotics: Daptomycin and rifampin for 6 weeks from 02/28/14.  Subjective: Patient feels that the swelling is improving, although the legs are still quite swollen. Denies chest pain or shortness of breath.  Objective: Vital Signs  Filed Vitals:   03/12/14 2303 03/13/14 0314 03/13/14 0558 03/13/14 1355  BP: 169/66  167/71 176/70    Pulse:      Temp: 98.3 F (36.8 C)  98.1 F (36.7 C) 97.7 F (36.5 C)  TempSrc: Oral  Oral Oral  Resp: 20  20 16   Height:      Weight:  95.4 kg (210 lb 5.1 oz)    SpO2: 95%  90% 94%    Intake/Output Summary (Last 24 hours) at 03/13/14 1424 Last data filed at 03/13/14 1356  Gross per 24 hour  Intake    822 ml  Output    450 ml  Net    372 ml   Filed Weights   03/12/14 0432 03/13/14 0314  Weight: 97.5 kg (214 lb 15.2 oz) 95.4 kg (210 lb 5.1 oz)    General appearance: alert, cooperative and no distress Resp: clear to auscultation bilaterally Cardio: regular rate and rhythm, S1, S2 normal, no murmur, click, rub or gallop GI: soft, non-tender; bowel sounds normal; no masses,  no organomegaly Extremities: 2- 3+ pitting edema bilateral lower extremities. Right groin wound was examined. No active drainage was noted. Neurologic: No focal deficits.  Lab Results:  Basic Metabolic Panel:  Recent Labs Lab 03/11/14 2225 03/12/14 0502 03/13/14 0525  NA 138 140 141  K 5.1 5.3* 4.6  CL 107 107 108  CO2 24 21 28   GLUCOSE 153* 144* 125*  BUN 55* 53* 51*  CREATININE 2.68* 2.88* 2.81*  CALCIUM 8.8 8.9 8.9   Liver Function Tests:  Recent Labs Lab 03/12/14 0502  AST 22  ALT 17  ALKPHOS 132*  BILITOT 0.6  PROT 7.1  ALBUMIN 2.5*   CBC:  Recent Labs Lab 03/11/14 2225 03/12/14 0502 03/13/14 0525  WBC 8.8 8.6 7.7  NEUTROABS  --  5.8  --   HGB 7.5* 7.5* 7.3*  HCT 24.6* 25.1* 24.1*  MCV 82.8 83.4 85.2  PLT 310 306 290   Cardiac Enzymes:  Recent Labs Lab 03/11/14 2342 03/12/14 0502 03/12/14 1105 03/12/14 1617  CKTOTAL  --  127 130 129  CKMB  --  5.6* 5.9* 7.1*  TROPONINI 0.09* 0.09* 0.09* 0.08*   CBG:  Recent Labs Lab 03/12/14 1146 03/12/14 1726 03/12/14 2113 03/13/14 0754 03/13/14 1153  GLUCAP 127* 150* 151* 100* 127*    Studies/Results: Dg Chest Port 1 View  03/11/2014   CLINICAL DATA:  Infected stent in the leg. Dialysis patient. Patient has  missed 3 dialysis appointment due to lack of access. Concern for fluid overload. Short of breath.  EXAM: PORTABLE CHEST - 1 VIEW  COMPARISON:  02/27/2014  FINDINGS: Postoperative changes in the mediastinum. Cardiac pacemaker. Left PICC catheter. Appliances are unchanged since prior study. Cardiac enlargement with prominent pulmonary vascularity and perihilar infiltration consistent with edema. Small bilateral pleural effusions and basilar atelectasis, greater on the left, and increasing since prior study.  IMPRESSION: Cardiac enlargement with pulmonary vascular congestion and edema. Small bilateral pleural effusions and basilar atelectasis, increasing since prior study.   Electronically Signed   By: Burman Nieves M.D.   On: 03/11/2014 22:34    Medications:  Scheduled: . aspirin  81 mg Oral Daily  . atorvastatin  80 mg Oral q1800  . cloNIDine  0.1 mg Transdermal Weekly  . [START ON 03/14/2014] DAPTOmycin (CUBICIN)  IV  500 mg Intravenous Q48H  . enoxaparin (LOVENOX) injection  30 mg Subcutaneous Q24H  . ferrous sulfate  325 mg Oral BID  . furosemide  80 mg Intravenous BID  . hydrALAZINE  100 mg Oral 3 times per day  . insulin aspart  0-5 Units Subcutaneous QHS  . insulin aspart  0-9 Units Subcutaneous TID WC  . insulin detemir  25 Units Subcutaneous QHS  . isosorbide mononitrate  30 mg Oral Daily  . levofloxacin (LEVAQUIN) IV  750 mg Intravenous Q48H  . metoprolol tartrate  50 mg Oral BID  . polyethylene glycol  17 g Oral Daily  . rifampin  300 mg Oral Q12H  . sodium chloride  3 mL Intravenous Q12H  . 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 Lungs appear to be clear at this time. Patient does not appear to be tachypneic or short of breath. No clinical evidence for pulmonary edema.  Pedal edema/possible acute on chronic diastolic heart failure Significant edema noted on examination. His weight, however, is not much different from what it was last week when he was discharged. Continue intravenous Lasix for now. He seems to be diuresing. No DVT noted on venous Doppler. Echocardiogram from December was noted. Systolic function was normal. There was perhaps a component of diastolic dysfunction.   Hx of R groin abscess w/ MRSA Doppler studies done  yesterday revealed fluid collection in the right groin. Vascular surgery has been consulted. Cont daptomycin and rifampin.   Normocytic Anemia Hemoglobin stable.    CKD stage 4 with hyperkalemia Calcium level now normal. Renal function appears to be stable. He did have acute renal failure during his last hospitalization as well, which was thought to be secondary to contrast-induced nephropathy. Renal function actually better from what it was during that hospitalization. Monitor urine output.  Minimally elevated troponin  Likely secondary to renal insufficiency. No significant elevation noted. He had a cardiac catheterization on January 7, which showed patent grafts. Considering this recent evaluation no further testing is warranted.  Type 2 diabetes mellitus  Continue sliding scale coverage.  OSA Cpap  DVT Prophylaxis: Lovenox    Code Status: Full code  Family Communication: Discussed with the patient  Disposition Plan: Return to SNF when better. PT/OT. Await vascular surgery input.    LOS: 2 days   [facility]  [facility] Pager [phone] 03/13/2014, 2:24 PM  If 7PM-7AM, please contact night-coverage at [email or web address], password  [facility]

## 2014-03-13 NOTE — Evaluation (Signed)
Physical Therapy Evaluation Patient Details Name: [name] MRN: [id number] DOB: 01/22/1951 Today's Date: 03/13/2014   History of Present Illness  Pt is a 64 y.o. Male admitted 03/11/14 from SNF ([facility]) with wound infection in his right groin. Per chart, pt has PICC line for IV antibiotics. PMH: s/p aorta bifemoral graft (01/03/14), admitted with fever and found to have absvess overlying Rt femoral graft (02/12/14), I&D (02/15/14), s/p gracilis muscle flap procedure to close R groin wound (02/21/14) and d/c on long term antiobiotics to SNF (03/03/14).   Clinical Impression  Pt admitted with/for R groin infection.  Pt currently limited functionally due to the problems listed. ( See problems list.)   Pt will benefit from PT to maximize function and safety in order to get ready for next venue listed below.     Follow Up Recommendations SNF    Equipment Recommendations  None recommended by PT;Other (comment) (TBA)    Recommendations for Other Services       Precautions / Restrictions Precautions Precautions: Fall      Mobility  Bed Mobility Overal bed mobility: Needs Assistance Bed Mobility: Supine to Sit;Sit to Supine     Supine to sit: Supervision        Transfers Overall transfer level: Needs assistance Equipment used: Rolling walker (2 wheeled) Transfers: Sit to/from Stand Sit to Stand: Min guard (x3)         General transfer comment: Min guard for safety. No physical assist needed.   Ambulation/Gait             General Gait Details: pt refused to walk with or without RW  Stairs            Wheelchair Mobility    Modified Rankin (Stroke Patients Only)       Balance Overall balance assessment: Needs assistance Sitting-balance support: No upper extremity supported Sitting balance-Leahy Scale: Fair     Standing balance support: Bilateral upper extremity supported Standing balance-Leahy Scale: Poor Standing balance comment: pt stood x3 into  RW, worked on some w/shifting and marching in place.  Pt ready to progress, but is self limitingl                             Pertinent Vitals/Pain Pain Assessment: No/denies pain    Home Living Family/patient expects to be discharged to:: Skilled nursing facility Living Arrangements: Spouse/significant other Available Help at Discharge: Family;Available 24 hours/day Type of Home: House Home Access: Stairs to enter Entrance Stairs-Rails: None Entrance Stairs-Number of Steps: 1 Home Layout: One level Home Equipment: Walker - 2 wheels Additional Comments: pt from [facility] and plans to return    Prior Function Level of Independence: Needs assistance   Gait / Transfers Assistance Needed: pt reports that he transfers to w/c with RW and pushes his w/c with his UEs. Pt reports he does not ambulate  ADL's / Homemaking Assistance Needed: Pt reports that he has assistance for bathing and dressing at SNF  Comments: Wife reports patient just started using RW     Hand Dominance   Dominant Hand: Right    Extremity/Trunk Assessment   Upper Extremity Assessment: Defer to OT evaluation           Lower Extremity Assessment: Overall WFL for tasks assessed;Generalized weakness (proximal weakness bil and decreased ankle ROM bil)      Cervical / Trunk Assessment: Normal  Communication   Communication: No difficulties  Cognition Arousal/Alertness: Awake/alert  Behavior During Therapy: WFL for tasks assessed/performed Overall Cognitive Status: History of cognitive impairments - at baseline     Current Attention Level: Focused Memory: Decreased short-term memory Following Commands: Follows one step commands consistently;Follows one step commands with increased time     Problem Solving: Slow processing;Requires verbal cues;Decreased initiation      General Comments General comments (skin integrity, edema, etc.): Rt LE/foot > L LE/foot edema, but both are edematous     Exercises        Assessment/Plan    PT Assessment Patient needs continued PT services  PT Diagnosis Generalized weakness;Altered mental status   PT Problem List Decreased strength;Decreased activity tolerance;Decreased balance;Decreased mobility;Decreased knowledge of use of DME  PT Treatment Interventions DME instruction;Gait training;Functional mobility training;Therapeutic activities;Therapeutic exercise;Balance training;Patient/family education   PT Goals (Current goals can be found in the Care Plan section) Acute Rehab PT Goals Patient Stated Goal: None stated PT Goal Formulation: With patient Time For Goal Achievement: 03/20/14 Potential to Achieve Goals: Fair    Frequency Min 3X/week   Barriers to discharge        Co-evaluation               End of Session   Activity Tolerance: Patient tolerated treatment well Patient left: with call bell/phone within reach;in chair;with bed alarm set Nurse Communication: Mobility status    Functional Assessment Tool Used: clinical judgement Functional Limitation: Mobility: Walking and moving around Mobility: Walking and Moving Around Current Status (E4540): At least 1 percent but less than 20 percent impaired, limited or restricted Mobility: Walking and Moving Around Goal Status [phone]): At least 1 percent but less than 20 percent impaired, limited or restricted    Time: 0957-1018 PT Time Calculation (min) (ACUTE ONLY): 21 min   Charges:   PT Evaluation $Initial PT Evaluation Tier I: 1 Procedure     PT G Codes:   PT G-Codes **NOT FOR INPATIENT CLASS** Functional Assessment Tool Used: clinical judgement Functional Limitation: Mobility: Walking and moving around Mobility: Walking and Moving Around Current Status (J4782): At least 1 percent but less than 20 percent impaired, limited or restricted Mobility: Walking and Moving Around Goal Status [phone]): At least 1 percent but less than 20 percent impaired, limited or  restricted    Ether Goebel, Eliseo Gum 03/13/2014, 10:45 AM 03/13/2014  Marana Bing, PT [phone]  (pager)

## 2014-03-13 NOTE — Clinical Documentation Improvement (Signed)
Supporting Information: Question mild CHF per 01/27 progress notes. Pulmonary vascular congestion per 01/27 progress notes.  Labs: 01/27: BNP: 2715.9.    Possible Clinical Condition: . Document acuity --Acute --Chronic --Acute on Chronic . Document type --Diastolic --Systolic --Combined systolic and diastolic . Due to or associated with --Cardiac or other surgery --Hypertension --Valvular disease --Rheumatic heart disease Endocarditis (valvitis) Pericarditis Myocarditis --Other (specify)    Thank Gabriel CirriYou,  Annalucia Laino Mathews-Bethea,RN,BSN,Clinical Documentation Specialist [phone] Gaddiel [email or web address]

## 2014-03-13 NOTE — Progress Notes (Signed)
NP Schorr notified concerning patient's b/p this afternoon. No new orders at this time. Will monitor patient

## 2014-03-13 NOTE — Progress Notes (Addendum)
POST OPERATIVE NOTE    CC:  F/u for surgery  HPI:  This is a 64 y.o. male who is s/p  1. Thrombectomy of left limb of aortofemoral bypass graft 2. Extensive endarterectomy of the left common femoral artery and deep femoral artery 3. Bypass from the left limb of the aortofemoral bypass graft to the deep femoral artery using 8 mm Dacron graft 4. Intraoperative arteriogram of the left lower extremity 5. Thrombectomy of the right limb of the aortofemoral bypass graft 6. Endarterectomy of the right common femoral artery 7. Dacron patch angioplasty of distal right limb of aortofemoral bypass graft On 01/03/14.    He was readmitted the end of December with right groin wound infection.  He was taken to the OR and underwent Incision and drainage right groin on 02/15/14.  A plastic surgery consult was obtained and he underwent a Gracilis muscle flap to the right groin on 02/21/14.    He is being followed by ID and is on daptomycin and rifampin and most recently placed on Levaquin.  Pt was readmitted 2 days ago with PNA, bilateral pleural effusions, worsening anemia and right LE swelling.    [facility]VS is consulted to evaluate right groin wound.  Allergies  Allergen Reactions  . Morphine And Related     Pt felt flushed and was diaphoretic     Current Facility-Administered Medications  Medication Dose Route Frequency Provider Last Rate Last Dose  . 0.9 %  sodium chloride infusion  250 mL Intravenous PRN Pearson Grippe, MD      . acetaminophen (TYLENOL) tablet 650 mg  650 mg Oral Q6H PRN Pearson Grippe, MD       Or  . acetaminophen (TYLENOL) suppository 650 mg  650 mg Rectal Q6H PRN Pearson Grippe, MD      . albuterol (PROVENTIL) (2.5 MG/3ML) 0.083% nebulizer solution 3 mL  3 mL Inhalation Q4H PRN Pearson Grippe, MD      . aspirin chewable tablet 81 mg  81 mg Oral Daily Pearson Grippe, MD   81 mg at 03/12/14 0859  . atorvastatin (LIPITOR) tablet 80 mg  80 mg Oral q1800 Pearson Grippe, MD   80 mg at 03/12/14 1756  . calcium  carbonate (TUMS - dosed in mg elemental calcium) chewable tablet 200 mg of elemental calcium  1 tablet Oral BID PRN Pearson Grippe, MD      . cloNIDine (CATAPRES - Dosed in mg/24 hr) patch 0.1 mg  0.1 mg Transdermal Weekly Pearson Grippe, MD   0.1 mg at 03/12/14 0900  . DAPTOmycin (CUBICIN) 600 mg in sodium chloride 0.9 % IVPB  600 mg Intravenous Q48H Anh P Pham, RPH   600 mg at 03/13/14 0009  . enoxaparin (LOVENOX) injection 30 mg  30 mg Subcutaneous Q24H Pearson Grippe, MD   30 mg at 03/12/14 0900  . ferrous sulfate tablet 325 mg  325 mg Oral BID Pearson Grippe, MD   325 mg at 03/13/14 0012  . furosemide (LASIX) injection 80 mg  80 mg Intravenous BID Osvaldo Shipper, MD   80 mg at 03/12/14 1756  . hydrALAZINE (APRESOLINE) injection 5 mg  5 mg Intravenous Q6H PRN Pearson Grippe, MD   5 mg at 03/12/14 1246  . hydrALAZINE (APRESOLINE) tablet 100 mg  100 mg Oral 3 times per day Osvaldo Shipper, MD   100 mg at 03/13/14 0609  . insulin aspart (novoLOG) injection 0-5 Units  0-5 Units Subcutaneous QHS Pearson Grippe, MD   0 Units at 03/12/14  2200  . insulin aspart (novoLOG) injection 0-9 Units  0-9 Units Subcutaneous TID WC Pearson GrippeJames Kim, MD   1 Units at 03/12/14 1756  . insulin detemir (LEVEMIR) injection 25 Units  25 Units Subcutaneous QHS Pearson GrippeJames Kim, MD   25 Units at 03/13/14 0016  . isosorbide mononitrate (IMDUR) 24 hr tablet 30 mg  30 mg Oral Daily Pearson GrippeJames Kim, MD   30 mg at 03/12/14 0859  . levofloxacin (LEVAQUIN) IVPB 750 mg  750 mg Intravenous Q48H Anh P Pham, RPH   750 mg at 03/12/14 1244  . metoprolol (LOPRESSOR) tablet 50 mg  50 mg Oral BID Pearson GrippeJames Kim, MD   50 mg at 03/13/14 0003  . ondansetron (ZOFRAN) tablet 4 mg  4 mg Oral Q6H PRN Pearson GrippeJames Kim, MD      . polyethylene glycol (MIRALAX / GLYCOLAX) packet 17 g  17 g Oral Daily Pearson GrippeJames Kim, MD   17 g at 03/12/14 0900  . rifampin (RIFADIN) capsule 300 mg  300 mg Oral Q12H Pearson GrippeJames Kim, MD   300 mg at 03/13/14 0012  . sodium chloride 0.9 % injection 3 mL  3 mL Intravenous Q12H Pearson GrippeJames Kim, MD   3  mL at 03/12/14 0900  . sodium chloride 0.9 % injection 3 mL  3 mL Intravenous Q12H Pearson GrippeJames Kim, MD   3 mL at 03/12/14 1000  . sodium chloride 0.9 % injection 3 mL  3 mL Intravenous PRN Pearson GrippeJames Kim, MD      . traMADol Janean Sark(ULTRAM) tablet 50 mg  50 mg Oral Q12H PRN Pearson GrippeJames Kim, MD         ROS:  See HPI  Physical Exam:  Filed Vitals:   03/13/14 0558  BP: 167/71  Pulse:   Temp: 98.1 F (36.7 C)  Resp: 20    Incision:  Right groin with ~2-753mm opening.  There is no active drainage at this time. Extremities:  Significant edema RLE with erythema on the medial/anterior portion of the RLE.  Bilateral feet are warm.   Vascular US 03/12/14: Vascular Ultrasound Right Lower Extremity Arterial Duplex has been completed.  Study was technically limited due to edema, poor patient cooperation, and involuntary movements. Patient has a history of an aorto-bifemoral bypass graft, however I was not able to visualize this due to technical limitations. There is significant edema/ interstitial fluid throughout the right lower extremity, making visualization of vasculature very limited. There is dampened monophasic flow visualized in the right posterior tibial and dorsalis pedis arteries.    Bilateral lower extremity venous duplex completed. Study was very difficult due to significant edema, poor patient cooperation, and involuntary movements. Visualized veins of bilateral lower extremities are negative for deep vein thrombosis. There is no evidence of Baker's cyst bilaterally.   Incidental finding: There is a 14 x 11cm heterogenous area of the right groin with fluid loculations. This area does not vascularized and cannot be connected to any surrounding vasculature. Suggestive of possible abscess versus etiology unknown.    Assessment/Plan:  This is a 64 y.o. male who is s/p (see HPI)   -Vascular US performed yesterday reveals a 14 x 11 cm heterogenous area of the right groin with fluid loculations.  On CT scan  of abdomen and pelvis on 02/14/14, there was a complex fluid collection present at that time as well that measured 12 cm.   -he has had a muscle flap since that time by plastic surgery-would also consult plastics for evaluation -cellulitis RLE-pt is on Levaquin, Daptomycin, and  rifampin-treatment per primary team.   Doreatha Massed, PA-C [facility] [phone]   Addendum  I have independently interviewed and examined the patient, and I agree with the physician assistant's findings.  No evidence of crepitus in R groin or extensive ascending erythema.  Complex situation:   If abscess adjacent to R ABF limb, likely infected limb which will require removal and subsequent R AKA vs hip dysarticulation.  Fluid collection could be possibly sterile also though no way to check without exploration.  With a muscle flap in the potential space, there is always concern that the muscle flap might be compromised also, which would lead to additional infection from the dead tissue.  Patient is already at high risk for L ABF limb occlusion which would lead to L AKA vs hip dysarticulation   At this point, would continue with IV abx.  Pt already ate so can't go to OR for R groin exploration.  There is no time on the schedule tomorrow also.  Dr. Edilia Bo will be back on Monday.  Will have Dr. Darrick Penna see the patient tomorrow.    Leonides Sake, MD [facility] [place] Office: [phone] Pager: [phone]  03/13/2014, 1:11 PM

## 2014-03-14 DIAGNOSIS — D649 Anemia, unspecified: Secondary | ICD-10-CM | POA: Insufficient documentation

## 2014-03-14 DIAGNOSIS — J209 Acute bronchitis, unspecified: Secondary | ICD-10-CM

## 2014-03-14 LAB — GLUCOSE, CAPILLARY
GLUCOSE-CAPILLARY: 105 mg/dL — AB (ref 70–99)
Glucose-Capillary: 55 mg/dL — ABNORMAL LOW (ref 70–99)
Glucose-Capillary: 94 mg/dL (ref 70–99)
Glucose-Capillary: 123 mg/dL — ABNORMAL HIGH (ref 70–99)

## 2014-03-14 LAB — MRSA PCR SCREENING: MRSA by PCR: POSITIVE — AB

## 2014-03-14 LAB — BASIC METABOLIC PANEL
Anion gap: 4 — ABNORMAL LOW (ref 5–15)
BUN: 49 mg/dL — AB (ref 6–23)
CALCIUM: 8.6 mg/dL (ref 8.4–10.5)
CHLORIDE: 105 mmol/L (ref 96–112)
CO2: 30 mmol/L (ref 19–32)
Creatinine, Ser: 2.67 mg/dL — ABNORMAL HIGH (ref 0.50–1.35)
GFR calc Af Amer: 27 mL/min — ABNORMAL LOW (ref >90)
GFR, EST NON AFRICAN AMERICAN: 24 mL/min — AB (ref >90)
Glucose, Bld: 101 mg/dL — ABNORMAL HIGH (ref 70–99)
Potassium: 4.6 mmol/L (ref 3.5–5.1)
Sodium: 139 mmol/L (ref 135–145)

## 2014-03-14 MED ORDER — INSULIN DETEMIR 100 UNIT/ML ~~LOC~~ SOLN
20.0000 [IU] | Freq: Every day | SUBCUTANEOUS | Status: DC
  Administered 2014-03-14 – 2014-03-15 (×2): 20 [IU] via SUBCUTANEOUS
  Filled 2014-03-14 (×3): qty 0.2

## 2014-03-14 MED ORDER — LEVOFLOXACIN 750 MG PO TABS: 750.0000 mg | ORAL_TABLET | ORAL | Status: DC

## 2014-03-14 MED ORDER — CHLORHEXIDINE GLUCONATE CLOTH 2 % EX PADS
6.0000 | MEDICATED_PAD | Freq: Every day | CUTANEOUS | Status: DC
  Administered 2014-03-15 – 2014-03-16 (×2): 6 via TOPICAL

## 2014-03-14 MED ORDER — TRAZODONE HCL 50 MG PO TABS
50.0000 mg | ORAL_TABLET | Freq: Every evening | ORAL | Status: DC | PRN
  Administered 2014-03-14: 50 mg via ORAL
  Filled 2014-03-14 (×2): qty 1

## 2014-03-14 MED ORDER — MUPIROCIN 2 % EX OINT
1.0000 "application " | TOPICAL_OINTMENT | Freq: Two times a day (BID) | CUTANEOUS | Status: DC
  Administered 2014-03-14 – 2014-03-16 (×5): 1 via NASAL
  Filled 2014-03-14 (×2): qty 22

## 2014-03-14 MED ORDER — FUROSEMIDE 10 MG/ML IJ SOLN
80.0000 mg | Freq: Three times a day (TID) | INTRAMUSCULAR | Status: DC
  Administered 2014-03-14 – 2014-03-16 (×6): 80 mg via INTRAVENOUS
  Filled 2014-03-14 (×7): qty 8

## 2014-03-14 NOTE — Progress Notes (Signed)
Patient is 3 weeks post gracilis muscle flap for coverage of prosthetic graft. Admitted 3 days ago; I was made aware today. VVS has done US with thigh fluid collection. Has not been febrile, WBC normal. Continues on IV antibiotics.   Temp:  [97.7 F (36.5 C)-98.4 F (36.9 C)] 98.4 F (36.9 C) (01/29 0631) Pulse Rate:  [70-71] 71 (01/29 0631) Resp:  [16-20] 18 (01/29 0631) BP: (147-179)/(70-111) 153/74 mmHg (01/29 0840) SpO2:  [92 %-94 %] 94 % (01/29 0631) Weight:  [99.1 kg (218 lb 7.6 oz)] 99.1 kg (218 lb 7.6 oz) (01/29 0631)   Patient denies significant drainage.  PE:  BLE with 2+ pitting edema,  Right groin with few mm separation skin, does not probe to any depth and no drainage Right medial thigh: proximal incision intact, scar maturing; distal incision with opening and serous drainage. --From this latter incision able to express copious seroma/lymphatic fluid --no cellulits   A/P: S/p gracilis flap  Groin incision where flap is in place over graft is stable  Medial thigh donor site has seroma/lymphatic leak which is not surprising given the type of surgery, reoperation, and his overall edema/heart failure and. Will start compression to area with Ted hose and ACE wrap to try and collapse cavity . I anticipate he will have large amount of watery drainage consistent with lymphatic leak and may require multiple dressing changes per day to keep dry. No evidence of deeper infection/abscess and no urgent surgical intervention required. Wound care ordered.  Glenna FellowsBrinda Sahiti Joswick, MD [facility] Plastic [facility] Reconstructive Surgery [phone]

## 2014-03-14 NOTE — Progress Notes (Signed)
CARE MANAGEMENT NOTE 03/14/2014  Patient:  [name],[name]   Account Number:  [id number]  Date Initiated:  03/14/2014  Documentation initiated by:  Letha CapeAYLOR,Camden Mazzaferro  Subjective/Objective Assessment:   dx infected stent  admit- from [facility].     Action/Plan:   Anticipated DC Date:  03/15/2014   Anticipated DC Plan:  SKILLED NURSING FACILITY  In-house referral  Clinical Social Worker      DC Planning Services  CM consult      Choice offered to / List presented to:             Status of service:  In process, will continue to follow Medicare Important Message given?  YES (If response is "NO", the following Medicare IM given date fields will be blank) Date Medicare IM given:  03/14/2014 Medicare IM given by:  Letha CapeAYLOR,Jazlynne Milliner Date Additional Medicare IM given:   Additional Medicare IM given by:    Discharge Disposition:    Per UR Regulation:    If discussed at Long Length of Stay Meetings, dates discussed:    Comments:  03/14/14 1842 Letha CapeDeborah Cniyah Sproull RN,BSN [phone] patient is from [facility], CSW referral.

## 2014-03-14 NOTE — Progress Notes (Signed)
[facility] [place]  Subjective  - Feels ok   Objective 153/74 71 98.4 F (36.9 C) (Oral) 18 94%  Intake/Output Summary (Last 24 hours) at 03/14/14 1228 Last data filed at 03/14/14 [id number]  Gross per 24 hour  Intake   1178 ml  Output   1125 ml  Net     53 ml   Right groin no erthema no drainage no fluctuance, left mid thigh no erythema, incision healing slightly fluctuant Right foot warm well perfused no ulceration  Assessment/Planning: Recent muscle flap for right groin infection.  Fluid collection on US found incidentally.  Pt is afebrile with no pointing signs of significant groin infection. His creatinine is significantly elevated compared to early January when CT was done so I would not repeat it currently and would follow clinically for now.  Dr Edilia Boickson will see pt again on Monday if still inpt otherwise he can follow up with Edilia Boickson as outpt.  Nikolus Marczak E 03/14/2014 12:28 PM --  Laboratory Lab Results:  Recent Labs  03/12/14 0502 03/13/14 0525  WBC 8.6 7.7  HGB 7.5* 7.3*  HCT 25.1* 24.1*  PLT 306 290   BMET  Recent Labs  03/12/14 0502 03/13/14 0525  NA 140 141  K 5.3* 4.6  CL 107 108  CO2 21 28  GLUCOSE 144* 125*  BUN 53* 51*  CREATININE 2.88* 2.81*  CALCIUM 8.9 8.9    COAG Lab Results  Component Value Date   INR 1.17 02/12/2014   INR 1.02 01/03/2014   INR 1.01 12/31/2013   No results found for: PTT

## 2014-03-14 NOTE — Progress Notes (Signed)
Inpatient Diabetes Program Recommendations  AACE/ADA: New Consensus Statement on Inpatient Glycemic Control (2013)  Target Ranges:  Prepandial:   less than 140 mg/dL      Peak postprandial:   less than 180 mg/dL (1-2 hours)      Critically ill patients:  140 - 180 mg/dL     Results for [name], [name] (MRN [id number]) as of 03/14/2014 12:06  Ref. Range 03/13/2014 07:54 03/13/2014 11:53 03/13/2014 17:02 03/13/2014 21:17  Glucose-Capillary Latest Range: 70-99 mg/dL 132100 (H) 440127 (H) 102137 (H) 132 (H)    Results for [name], [name] (MRN [id number]) as of 03/14/2014 12:06  Ref. Range 03/14/2014 08:09 03/14/2014 08:37  Glucose-Capillary Latest Range: 70-99 mg/dL 55 (L) 94     Admitted with Bronchitis.  History of DM, CAD, CABG, CKD4, Groin Abscess.   Home DM Meds: Levemir 25 units QHS       Novolog SSI   Current Insulin Orders: Levemir 25 units QHS      Novolog Sensitive SSI tid ac + HS    **Mild Hypoglycemia this AM- CBG 55 mg/dl   MD- Please consider decreasing Levemir to 22 units QHS if patient continues to have early AM Hypoglycemia     Will follow Ambrose FinlandJeannine Johnston Shirle Provencal RN, MSN, CDE Diabetes Coordinator Inpatient Diabetes Program Team Pager: [phone] (8a-10p)

## 2014-03-14 NOTE — Progress Notes (Signed)
Pt refuses to wear CPAP tonight. Pt encouraged to call RT if pt changes mind. No distress noted. 

## 2014-03-14 NOTE — Progress Notes (Addendum)
[facility] PROGRESS NOTE  [name] WGN:[id number] DOB: 26-Jun-1950 DOA: 03/11/2014  PCP: Milinda Antis, MD  Brief HPI: 64 year old African-American male was recently hospitalized for infection of the right groin area after aortofemoral bypass graft surgery. He was discharged to skilled nursing facility on IV antibiotics. He was sent over due to worsening edema and drainage from his right groin wound. He was also short of breath. He was admitted for further management. He was found to have a fluid collection in his right groin. Vascular surgery was consulted.  Past medical history:  Past Medical History  Diagnosis Date  . Arteriosclerotic cardiovascular disease (ASCVD)     CABG in 2003 for 3 VD; 07/2007: Stress nuclear-inferior infarction without ischemia; normal EF by echo in 06/2005  . Diabetes mellitus type II   . Hyperlipidemia   . Degenerative joint disease     Left knee  . Cerebrovascular disease 2003    Carotid ultrasound in 2010: Patent right CEA site, 60-79% left internal carotid artery  . Peripheral vascular disease 2007    aortobifemoral BPG-2007  . Anemia   . Peripheral neuropathy   . Hypertension   . Sick sinus syndrome 1996    permanent pacemaker-1996; generator change in 2010  . Chronic obstructive pulmonary disease   . Obesity   . Obstructive sleep apnea   . Benign prostatic hypertrophy   . Gastroesophageal reflux disease   . Tobacco abuse, in remission     50 pack years total consumption; Quit in 2007    Consultants: None  Procedures:  Venous Doppler of lower extremities  No DVT noted.   Arterial Dopplers of right lower extremity Fluid collection noted in the right groin area. See report.  Antibiotics: Daptomycin and rifampin for 6 weeks from 02/28/14.  Subjective: Patient feels that the swelling is slowly improving, although the legs are still quite swollen. Denies chest pain or shortness of breath.  Objective: Vital Signs  Filed  Vitals:   03/13/14 2000 03/13/14 2004 03/14/14 0631 03/14/14 0840  BP:  179/76 147/111 153/74  Pulse:  70 71   Temp:  98.2 F (36.8 C) 98.4 F (36.9 C)   TempSrc:  Oral Oral   Resp: Height:      Weight:   99.1 kg (218 lb 7.6 oz)   SpO2:  92% 94%     Intake/Output Summary (Last 24 hours) at 03/14/14 1005 Last data filed at 03/14/14 0952  Gross per 24 hour  Intake   1178 ml  Output   1125 ml  Net     53 ml   Filed Weights   03/12/14 0432 03/13/14 0314 03/14/14 0631  Weight: 97.5 kg (214 lb 15.2 oz) 95.4 kg (210 lb 5.1 oz) 99.1 kg (218 lb 7.6 oz)    General appearance: alert, cooperative and no distress Resp: clear to auscultation bilaterally Cardio: regular rate and rhythm, S1, S2 normal, no murmur, click, rub or gallop GI: soft, non-tender; bowel sounds normal; no masses,  no organomegaly Extremities: 2- 3+ pitting edema bilateral lower extremities with some improvement. Right groin wound was examined. No active drainage was noted. Neurologic: No focal deficits.  Lab Results:  Basic Metabolic Panel:  Recent Labs Lab 03/11/14 2225 03/12/14 0502 03/13/14 0525  NA 138 140 141  K 5.1 5.3* 4.6  CL 107 107 108  CO2 GLUCOSE 153* 144* 125*  BUN 55* 53* 51*  CREATININE 2.68* 2.88* 2.81*  CALCIUM  8.8 8.9 8.9   Liver Function Tests:  Recent Labs Lab 03/12/14 0502  AST 22  ALT 17  ALKPHOS 132*  BILITOT 0.6  PROT 7.1  ALBUMIN 2.5*   CBC:  Recent Labs Lab 03/11/14 2225 03/12/14 0502 03/13/14 0525  WBC 8.8 8.6 7.7  NEUTROABS  --  5.8  --   HGB 7.5* 7.5* 7.3*  HCT 24.6* 25.1* 24.1*  MCV 82.8 83.4 85.2  PLT 310 306 290   Cardiac Enzymes:  Recent Labs Lab 03/11/14 2342 03/12/14 0502 03/12/14 1105 03/12/14 1617  CKTOTAL  --  127 130 129  CKMB  --  5.6* 5.9* 7.1*  TROPONINI 0.09* 0.09* 0.09* 0.08*   CBG:  Recent Labs Lab 03/13/14 1153 03/13/14 1702 03/13/14 2117 03/14/14 0809 03/14/14 0837  GLUCAP 127* 137* 132* 55* 94      Studies/Results: No results found.  Medications:  Scheduled: . aspirin  81 mg Oral Daily  . atorvastatin  80 mg Oral q1800  . cloNIDine  0.1 mg Transdermal Weekly  . DAPTOmycin (CUBICIN)  IV  500 mg Intravenous Q48H  . enoxaparin (LOVENOX) injection  30 mg Subcutaneous Q24H  . ferrous sulfate  325 mg Oral BID  . furosemide  80 mg Intravenous BID  . hydrALAZINE  100 mg Oral 3 times per day  . insulin aspart  0-5 Units Subcutaneous QHS  . insulin aspart  0-9 Units Subcutaneous TID WC  . insulin detemir  25 Units Subcutaneous QHS  . isosorbide mononitrate  30 mg Oral Daily  . levofloxacin (LEVAQUIN) IV  750 mg Intravenous Q48H  . metoprolol tartrate  50 mg Oral BID  . polyethylene glycol  17 g Oral Daily  . rifampin  300 mg Oral Q12H  . sodium chloride  3 mL Intravenous Q12H  . 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 We will change it to oral. Patient denies any respiratory symptoms.No clinical evidence for pulmonary edema.  Pedal edema/possible acute on chronic diastolic heart failure Significant edema noted on examination. His weight, however, is not much different from what it was last week when he was discharged. Continue intravenous Lasix for now. Will increase to every 8 hours. He seems to be diuresing though Ins and outs not being charted accurately. No DVT noted on venous Doppler. Echocardiogram from December was noted. Systolic function was normal. There was perhaps a component of diastolic  dysfunction.   Hx of R groin abscess w/ MRSA Doppler studies revealed fluid collection in the right groin. Vascular surgery has been consulted. Cont daptomycin and rifampin. Also notified plastic surgery.  Normocytic Anemia Hemoglobin stable.    CKD stage 4 with hyperkalemia Calcium level now normal. Renal function appears to be stable. He did have acute renal failure during his last hospitalization as well, which was thought to be secondary to contrast-induced nephropathy. Renal function actually better from what it was during that hospitalization. Monitor urine output.  Minimally elevated troponin  Likely secondary to renal insufficiency. No significant elevation noted. He had a cardiac catheterization on January 7, which showed patent grafts.  Considering this recent evaluation no further testing is warranted.  Type 2 diabetes mellitus  Continue sliding scale coverage. Hypoglycemic episode noted this morning. We will adjust Levemir dose.  OSA Cpap  DVT Prophylaxis: Lovenox    Code Status: Full code  Family Communication: Discussed with the patient  Disposition Plan: Return to SNF when better. PT/OT. Await further vascular surgery input.    LOS: 3 days   [facility] LLCKRISHNAN,Meylin Stenzel  [facility] Pager [phone] 03/14/2014, 10:05 AM  If 7PM-7AM, please contact night-coverage at [email or web address], password [email or web address]

## 2014-03-14 NOTE — Clinical Social Work Note (Signed)
CSW has left report for weekend CSW. [facility] able to accept patient over weekend if patient is ready.    Roddie McBryant Lesbia Ottaway MSW, MoabLCSWA, IvinsLCASA, [phone]

## 2014-03-15 LAB — CBC
HCT: 24.5 % — ABNORMAL LOW (ref 39.0–52.0)
Hemoglobin: 7.5 g/dL — ABNORMAL LOW (ref 13.0–17.0)
MCH: 26.3 pg (ref 26.0–34.0)
MCHC: 30.6 g/dL (ref 30.0–36.0)
MCV: 86 fL (ref 78.0–100.0)
Platelets: 300 10*3/uL (ref 150–400)
RBC: 2.85 MIL/uL — AB (ref 4.22–5.81)
RDW: 16.2 % — AB (ref 11.5–15.5)
WBC: 9.2 10*3/uL (ref 4.0–10.5)

## 2014-03-15 LAB — GLUCOSE, CAPILLARY
GLUCOSE-CAPILLARY: 92 mg/dL (ref 70–99)
Glucose-Capillary: 141 mg/dL — ABNORMAL HIGH (ref 70–99)
Glucose-Capillary: 152 mg/dL — ABNORMAL HIGH (ref 70–99)
Glucose-Capillary: 149 mg/dL — ABNORMAL HIGH (ref 70–99)
Glucose-Capillary: 149 mg/dL — ABNORMAL HIGH (ref 70–99)

## 2014-03-15 LAB — BASIC METABOLIC PANEL
ANION GAP: 6 (ref 5–15)
BUN: 51 mg/dL — ABNORMAL HIGH (ref 6–23)
CALCIUM: 8.5 mg/dL (ref 8.4–10.5)
CHLORIDE: 102 mmol/L (ref 96–112)
CO2: 29 mmol/L (ref 19–32)
CREATININE: 2.91 mg/dL — AB (ref 0.50–1.35)
GFR calc non Af Amer: 21 mL/min — ABNORMAL LOW (ref >90)
GFR, EST AFRICAN AMERICAN: 25 mL/min — AB (ref >90)
Glucose, Bld: 135 mg/dL — ABNORMAL HIGH (ref 70–99)
Potassium: 4.4 mmol/L (ref 3.5–5.1)
Sodium: 137 mmol/L (ref 135–145)

## 2014-03-15 MED ORDER — SODIUM CHLORIDE 0.9 % IJ SOLN
10.0000 mL | INTRAMUSCULAR | Status: DC | PRN
  Administered 2014-03-15: 10 mL
  Administered 2014-03-16: 20 mL
  Filled 2014-03-15: qty 40

## 2014-03-15 MED ORDER — LEVOFLOXACIN 500 MG PO TABS
500.0000 mg | ORAL_TABLET | ORAL | Status: DC
  Administered 2014-03-16: 500 mg via ORAL
  Filled 2014-03-15: qty 1

## 2014-03-15 NOTE — Progress Notes (Signed)
Pt. Refuses to wear ted hose after dressing change.

## 2014-03-15 NOTE — Progress Notes (Signed)
ANTIBIOTIC CONSULT NOTE - FOLLOW UP  Pharmacy Consult for Levofloxacin and Daptomycin Indication: pneumonia and MRSA groin abscess  Allergies  Allergen Reactions  . Morphine And Related     Pt felt flushed and was diaphoretic     Patient Measurements: Height:  (165.1 cm) Weight: 214 lb 4.6 oz (97.2 kg) IBW/kg (Calculated) : 61.5 Adjusted Body Weight:   Vital Signs: Temp: 98.4 F (36.9 C) (01/30 0622) Temp Source: Oral (01/30 0622) BP: 158/72 mmHg (01/30 1045) Pulse Rate: 74 (01/30 1045) Intake/Output from previous day: 01/29 0701 - 01/30 0700 In: 468 [P.O.:458; I.V.:10] Out: 1275 [Urine:1275] Intake/Output from this shift: Total I/O In: 240 [P.O.:240] Out: -   Labs:  Recent Labs  03/13/14 0525 03/14/14 0900 03/15/14 0525  WBC 7.7  --  9.2  HGB 7.3*  --  7.5*  PLT 290  --  300  CREATININE 2.81* 2.67* 2.91*   Estimated Creatinine Clearance: 27.5 mL/min (by C-G formula based on Cr of 2.91). No results for input(s): VANCOTROUGH, VANCOPEAK, VANCORANDOM, GENTTROUGH, GENTPEAK, GENTRANDOM, TOBRATROUGH, TOBRAPEAK, TOBRARND, AMIKACINPEAK, AMIKACINTROU, AMIKACIN in the last 72 hours.   Microbiology: Recent Results (from the past 720 hour(s))  Wound culture     Status: None   Collection Time: 02/14/14 10:08 AM  Result Value Ref Range Status   Specimen Description WOUND GROIN RIGHT  Final   Special Requests NONE  Final   Gram Stain   Final    NO WBC SEEN NO SQUAMOUS EPITHELIAL CELLS SEEN NO ORGANISMS SEEN Performed at [facility]    Culture   Final    NO GROWTH 3 DAYS Performed at [facility]    Report Status 02/17/2014 FINAL  Final  Body fluid culture     Status: None   Collection Time: 02/15/14  9:49 AM  Result Value Ref Range Status   Specimen Description FLUID RIGHT DRAINAGE GROIN  Final   Special Requests NONE  Final   Gram Stain   Final    ABUNDANT WBC PRESENT, PREDOMINANTLY PMN RARE GRAM POSITIVE COCCI IN PAIRS Performed at  [facility]    Culture   Final    MODERATE METHICILLIN RESISTANT STAPHYLOCOCCUS AUREUS Note: RIFAMPIN AND GENTAMICIN SHOULD NOT BE USED AS SINGLE DRUGS FOR TREATMENT OF STAPH INFECTIONS. This organism DOES NOT demonstrate inducible Clindamycin resistance in vitro. CRITICAL RESULT CALLED TO, READ BACK BY AND VERIFIED WITH: CAROL@8 :58AM ON  02/17/13 BY DANTS Performed at [facility]    Report Status 02/17/2014 FINAL  Final   Organism ID, Bacteria METHICILLIN RESISTANT STAPHYLOCOCCUS AUREUS  Final      Susceptibility   Methicillin resistant staphylococcus aureus - MIC*    CLINDAMYCIN <=0.25 SENSITIVE Sensitive     ERYTHROMYCIN >=8 RESISTANT Resistant     GENTAMICIN <=0.5 SENSITIVE Sensitive     LEVOFLOXACIN >=8 RESISTANT Resistant     OXACILLIN >=4 RESISTANT Resistant     PENICILLIN >=0.5 RESISTANT Resistant     RIFAMPIN <=0.5 SENSITIVE Sensitive     TRIMETH/SULFA >=320 RESISTANT Resistant     VANCOMYCIN 1 SENSITIVE Sensitive     TETRACYCLINE <=1 SENSITIVE Sensitive     * MODERATE METHICILLIN RESISTANT STAPHYLOCOCCUS AUREUS  MRSA PCR Screening     Status: None   Collection Time: 02/21/14  4:11 AM  Result Value Ref Range Status   MRSA by PCR NEGATIVE NEGATIVE Final    Comment:        The GeneXpert MRSA Assay (FDA approved for NASAL specimens only),  is one component of a comprehensive MRSA colonization surveillance program. It is not intended to diagnose MRSA infection nor to guide or monitor treatment for MRSA infections.   Clostridium Difficile by PCR     Status: None   Collection Time: 03/03/14  5:31 PM  Result Value Ref Range Status   C difficile by pcr NEGATIVE NEGATIVE Final  MRSA PCR Screening     Status: Abnormal   Collection Time: 03/14/14 10:05 AM  Result Value Ref Range Status   MRSA by PCR POSITIVE (A) NEGATIVE Final    Comment:        The GeneXpert MRSA Assay (FDA approved for NASAL specimens only), is one component of a comprehensive MRSA  colonization surveillance program. It is not intended to diagnose MRSA infection nor to guide or monitor treatment for MRSA infections. RESULT CALLED TO, READ BACK BY AND VERIFIED WITH: T. LUCAS RN 13:00 03/14/14 ([email or web address])     Anti-infectives    Start     Dose/Rate Route Frequency Ordered Stop   03/16/14 1200  levofloxacin (LEVAQUIN) tablet 750 mg     750 mg Oral Every 48 hours 03/14/14 1222     03/14/14 2200  DAPTOmycin (CUBICIN) 500 mg in sodium chloride 0.9 % IVPB     500 mg220 mL/hr over 30 Minutes Intravenous Every 48 hours 03/13/14 1110     03/12/14 2100  DAPTOmycin (CUBICIN) 600 mg in sodium chloride 0.9 % IVPB  Status:  Discontinued     600 mg224 mL/hr over 30 Minutes Intravenous Every 48 hours 03/12/14 1219 03/13/14 1110   03/12/14 1300  levofloxacin (LEVAQUIN) IVPB 750 mg     750 mg100 mL/hr over 90 Minutes Intravenous Every 48 hours 03/12/14 1219 03/14/14 1451   03/12/14 1000  rifampin (RIFADIN) capsule 300 mg     300 mg Oral Every 12 hours 03/12/14 0416     03/12/14 0430  levofloxacin (LEVAQUIN) IVPB 500 mg     500 mg100 mL/hr over 60 Minutes Intravenous  Once 03/12/14 0416 03/12/14 0628      Assessment: 64yo with (+)MRSA groin abscess continuing on 6 wks (thru 2/19) of Daptomycin + Rifampin, and on Levofloxacin day#4 for pna/UTI.  There is no new culture data this admission.  Pt is afebrile, WBC wnl and is on [place]-1L.  His Cr is up to 2.91 today, CrCl ~6922ml/min.  Levofloxacin has been changed to PO, but will require dosage adjustment for renal function.  Goal of Therapy:  Resolution of infection  Plan:  Continue Daptomycin 500mg  IV q48 with Rifampin 300mg  po bid Change Levofloxacin to 500mg  po q48, next dose 1/31 Continue to monitor weekly CKs on Dapto, next due 2/2 Watch renal function  Marisue HumbleKendra Shylo Dillenbeck, PharmD Clinical Pharmacist [facility]

## 2014-03-15 NOTE — Progress Notes (Signed)
Patient refused CPAP for the night  

## 2014-03-15 NOTE — Progress Notes (Signed)
[facility] PROGRESS NOTE  Collin LeitzWilliam F [name] ZOX:[id number] DOB: 10/06/1950 DOA: 03/11/2014  PCP: [name], [name], MD  Brief HPI: 64 year old African-American male was recently hospitalized for infection of the right groin area after aortofemoral bypass graft surgery. He was discharged to skilled nursing facility on IV antibiotics. He was sent over due to worsening edema and drainage from his right groin wound. He was also short of breath. He was admitted for further management. He was found to have a fluid collection in his right groin. Vascular surgery was consulted. No plans for intervention.  Past medical history:  Past Medical History  Diagnosis Date  . Arteriosclerotic cardiovascular disease (ASCVD)     CABG in 2003 for 3 VD; 07/2007: Stress nuclear-inferior infarction without ischemia; normal EF by echo in 06/2005  . Diabetes mellitus type II   . Hyperlipidemia   . Degenerative joint disease     Left knee  . Cerebrovascular disease 2003    Carotid ultrasound in 2010: Patent right CEA site, 60-79% left internal carotid artery  . Peripheral vascular disease 2007    aortobifemoral BPG-2007  . Anemia   . Peripheral neuropathy   . Hypertension   . Sick sinus syndrome 1996    permanent pacemaker-1996; generator change in 2010  . Chronic obstructive pulmonary disease   . Obesity   . Obstructive sleep apnea   . Benign prostatic hypertrophy   . Gastroesophageal reflux disease   . Tobacco abuse, in remission     50 pack years total consumption; Quit in 2007    Consultants: None  Procedures:  Venous Doppler of lower extremities  No DVT noted.   Arterial Dopplers of right lower extremity Fluid collection noted in the right groin area. See report.  Antibiotics: Daptomycin and rifampin for 6 weeks from 02/28/14.  Subjective: Patient feels better. He wants to go back to the skilled nursing facility. Swelling is improving. Denies any pain.   Objective: Vital  Signs  Filed Vitals:   03/14/14 1323 03/14/14 2155 03/15/14 0622 03/15/14 0622  BP: 180/78 160/75  151/70  Pulse: 69 68  70  Temp: 98.2 F (36.8 C) 98.3 F (36.8 C)  98.4 F (36.9 C)  TempSrc: Oral Oral  Oral  Resp:  15  18  Height:      Weight:   97.2 kg (214 lb 4.6 oz)   SpO2: 94% 92%  96%    Intake/Output Summary (Last 24 hours) at 03/15/14 0842 Last data filed at 03/15/14 0528  Gross per 24 hour  Intake    232 ml  Output   1275 ml  Net  -1043 ml   Filed Weights   03/13/14 0314 03/14/14 0631 03/15/14 0622  Weight: 95.4 kg (210 lb 5.1 oz) 99.1 kg (218 lb 7.6 oz) 97.2 kg (214 lb 4.6 oz)    General appearance: alert, cooperative and no distress Resp: clear to auscultation bilaterally Cardio: regular rate and rhythm, S1, S2 normal, no murmur, click, rub or gallop GI: soft, non-tender; bowel sounds normal; no masses,  no organomegaly Extremities: Improving 2+ pitting edema bilateral lower extremities. Right groin wound was examined. No active drainage was noted. Neurologic: No focal deficits.  Lab Results:  Basic Metabolic Panel:  Recent Labs Lab 03/11/14 2225 03/12/14 0502 03/13/14 0525 03/14/14 0900 03/15/14 0525  NA 138 140 141 139 137  K 5.1 5.3* 4.6 4.6 4.4  CL 107 107 108 105 102  CO2 24 21 28 30 29   GLUCOSE 153* 144* 125*  101* 135*  BUN 55* 53* 51* 49* 51*  CREATININE 2.68* 2.88* 2.81* 2.67* 2.91*  CALCIUM 8.8 8.9 8.9 8.6 8.5   Liver Function Tests:  Recent Labs Lab 03/12/14 0502  AST 22  ALT 17  ALKPHOS 132*  BILITOT 0.6  PROT 7.1  ALBUMIN 2.5*   CBC:  Recent Labs Lab 03/11/14 2225 03/12/14 0502 03/13/14 0525 03/15/14 0525  WBC 8.8 8.6 7.7 9.2  NEUTROABS  --  5.8  --   --   HGB 7.5* 7.5* 7.3* 7.5*  HCT 24.6* 25.1* 24.1* 24.5*  MCV 82.8 83.4 85.2 86.0  PLT 310 306 290 300   Cardiac Enzymes:  Recent Labs Lab 03/11/14 2342 03/12/14 0502 03/12/14 1105 03/12/14 1617  CKTOTAL  --  127 130 129  CKMB  --  5.6* 5.9* 7.1*   TROPONINI 0.09* 0.09* 0.09* 0.08*   CBG:  Recent Labs Lab 03/14/14 0837 03/14/14 1207 03/14/14 1653 03/14/14 2151 03/15/14 0756  GLUCAP 94 105* 123* 141* 92    Studies/Results: No results found.  Medications:  Scheduled: . aspirin  81 mg Oral Daily  . atorvastatin  80 mg Oral q1800  . Chlorhexidine Gluconate Cloth  6 each Topical Q0600  . cloNIDine  0.1 mg Transdermal Weekly  . DAPTOmycin (CUBICIN)  IV  500 mg Intravenous Q48H  . enoxaparin (LOVENOX) injection  30 mg Subcutaneous Q24H  . ferrous sulfate  325 mg Oral BID  . furosemide  80 mg Intravenous 3 times per day  . hydrALAZINE  100 mg Oral 3 times per day  . insulin aspart  0-5 Units Subcutaneous QHS  . insulin aspart  0-9 Units Subcutaneous TID WC  . insulin detemir  20 Units Subcutaneous QHS  . isosorbide mononitrate  30 mg Oral Daily  . [START ON 03/16/2014] levofloxacin  750 mg Oral Q48H  . metoprolol tartrate  50 mg Oral BID  . mupirocin ointment  1 application Nasal BID  . polyethylene glycol  17 g Oral Daily  . rifampin  300 mg Oral Q12H  . sodium chloride  3 mL Intravenous Q12H  . 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 Patient no longer has any respiratory symptoms.No clinical evidence for pulmonary edema.  Pedal edema/possible acute on chronic diastolic heart failure Seems to be diuresing more with higher dose of Lasix. This will be continued for  today. Edema is improving. There seems to be discrepancy in his weight measurements. Strict ins and outs. No DVT noted on venous Doppler. Echocardiogram from December was noted. Systolic function was normal. There was perhaps a component of diastolic dysfunction.   Hx of R groin abscess w/ MRSA Doppler studies revealed fluid collection in the right groin. Vascular surgery and plastic surgery was consulted. It was felt that this fluid collection was likely a seroma or a lymphatic leak. Considering that he is afebrile and has a normal WBC this does not appear to be a recurrence of his abscess. There is no need for further surgical intervention.  Continue with his daptomycin and rifampin as before.   Normocytic Anemia Hemoglobin remains stable.    CKD stage 4 Renal function appears to be stable. Creatinine did climb slightly which is likely due to diuresis. Monitor daily. Potassium is corrected. He did have acute renal failure during his last hospitalization as well, which was thought to be secondary to contrast-induced nephropathy. Renal function actually better from what it was during that hospitalization. Monitor urine output.  Minimally elevated troponin  Likely secondary to renal insufficiency. No significant elevation noted. He had a cardiac catheterization on January 7, which showed patent grafts. Considering this recent evaluation no further testing is warranted.  Type 2 diabetes mellitus  Continue sliding scale coverage. No further hypoglycemic episodes. Continue current dose of insulin.   OSA Cpap  DVT Prophylaxis: Lovenox    Code Status: Full code  Family Communication: Discussed with the patient  Disposition Plan: Return to SNF when better. Anticipate discharge in 1-2 days.    LOS: 4 days   [facility]  [facility] Pager [phone] 03/15/2014, 8:42 AM  If 7PM-7AM, please contact night-coverage at [email or web address], password [facility]

## 2014-03-16 LAB — BASIC METABOLIC PANEL
Anion gap: 11 (ref 5–15)
BUN: 56 mg/dL — ABNORMAL HIGH (ref 6–23)
CO2: 26 mmol/L (ref 19–32)
CREATININE: 2.82 mg/dL — AB (ref 0.50–1.35)
Calcium: 8.6 mg/dL (ref 8.4–10.5)
Chloride: 101 mmol/L (ref 96–112)
GFR calc Af Amer: 26 mL/min — ABNORMAL LOW (ref >90)
GFR calc non Af Amer: 22 mL/min — ABNORMAL LOW (ref >90)
GLUCOSE: 102 mg/dL — AB (ref 70–99)
Potassium: 4.1 mmol/L (ref 3.5–5.1)
Sodium: 138 mmol/L (ref 135–145)

## 2014-03-16 LAB — GLUCOSE, CAPILLARY
GLUCOSE-CAPILLARY: 68 mg/dL — AB (ref 70–99)
Glucose-Capillary: 90 mg/dL (ref 70–99)
Glucose-Capillary: 137 mg/dL — ABNORMAL HIGH (ref 70–99)

## 2014-03-16 MED ORDER — SODIUM CHLORIDE 0.9 % IV SOLN: 600.0000 mg | INTRAVENOUS | Status: AC

## 2014-03-16 MED ORDER — ALBUTEROL SULFATE 0.63 MG/3ML IN NEBU: 1.0000 | INHALATION_SOLUTION | RESPIRATORY_TRACT | Status: AC | PRN

## 2014-03-16 MED ORDER — ALTEPLASE 2 MG IJ SOLR
2.0000 mg | Freq: Once | INTRAMUSCULAR | Status: AC
  Administered 2014-03-16: 2 mg
  Filled 2014-03-16: qty 2

## 2014-03-16 MED ORDER — TRAMADOL HCL 50 MG PO TABS: 50.0000 mg | ORAL_TABLET | Freq: Two times a day (BID) | ORAL | Status: AC | PRN

## 2014-03-16 MED ORDER — LEVOFLOXACIN 500 MG PO TABS: 500.0000 mg | ORAL_TABLET | ORAL | Status: AC

## 2014-03-16 MED ORDER — HYDRALAZINE HCL 50 MG PO TABS: 100.0000 mg | ORAL_TABLET | Freq: Three times a day (TID) | ORAL | Status: AC

## 2014-03-16 MED ORDER — HEPARIN SOD (PORK) LOCK FLUSH 100 UNIT/ML IV SOLN
250.0000 [IU] | INTRAVENOUS | Status: AC | PRN
  Administered 2014-03-16: 250 [IU]

## 2014-03-16 MED ORDER — INSULIN ASPART 100 UNIT/ML ~~LOC~~ SOLN: SUBCUTANEOUS | Status: AC

## 2014-03-16 MED ORDER — FUROSEMIDE 40 MG PO TABS: ORAL_TABLET | ORAL | Status: AC

## 2014-03-16 MED ORDER — INSULIN DETEMIR 100 UNIT/ML FLEXPEN: 20.0000 [IU] | PEN_INJECTOR | Freq: Every day | SUBCUTANEOUS | Status: DC

## 2014-03-16 MED ORDER — INSULIN DETEMIR 100 UNIT/ML FLEXPEN: 16.0000 [IU] | PEN_INJECTOR | Freq: Every day | SUBCUTANEOUS | Status: AC

## 2014-03-16 NOTE — Discharge Instructions (Signed)
Chronic Kidney Disease °Chronic kidney disease occurs when the kidneys are damaged over a long period. The kidneys are two organs that lie on either side of the spine between the middle of the back and the front of the abdomen. The kidneys:  °· Remove wastes and extra water from the blood.   °· Produce important hormones. These help keep bones strong, regulate blood pressure, and help create red blood cells.   °· Balance the fluids and chemicals in the blood and tissues. °A small amount of kidney damage may not cause problems, but a large amount of damage may make it difficult or impossible for the kidneys to work the way they should. If steps are not taken to slow down the kidney damage or stop it from getting worse, the kidneys may stop working permanently. Most of the time, chronic kidney disease does not go away. However, it can often be controlled, and those with the disease can usually live normal lives. °CAUSES  °The most common causes of chronic kidney disease are diabetes and high blood pressure (hypertension). Chronic kidney disease may also be caused by:  °· Diseases that cause the kidneys' filters to become inflamed.   °· Diseases that affect the immune system.   °· Genetic diseases.   °· Medicines that damage the kidneys, such as anti-inflammatory medicines.   °· Poisoning or exposure to toxic substances.   °· A reoccurring kidney or urinary infection.   °· A problem with urine flow. This may be caused by:   °¨ Cancer.   °¨ Kidney stones.   °¨ An enlarged prostate in males. °SIGNS AND SYMPTOMS  °Because the kidney damage in chronic kidney disease occurs slowly, symptoms develop slowly and may not be obvious until the kidney damage becomes severe. A person may have a kidney disease for years without showing any symptoms. Symptoms can include:  °· Swelling (edema) of the legs, ankles, or feet.   °· Tiredness (lethargy).   °· Nausea or vomiting.   °· Confusion.   °· Problems with urination, such as:    °¨ Decreased urine production.   °¨ Frequent urination, especially at night.   °¨ Frequent accidents in children who are potty trained.   °· Muscle twitches and cramps.   °· Shortness of breath.  °· Weakness.   °· Persistent itchiness.   °· Loss of appetite. °· Metallic taste in the mouth. °· Trouble sleeping. °· Slowed development in children. °· Short stature in children. °DIAGNOSIS  °Chronic kidney disease may be detected and diagnosed by tests, including blood, urine, imaging, or kidney biopsy tests.  °TREATMENT  °Most chronic kidney diseases cannot be cured. Treatment usually involves relieving symptoms and preventing or slowing the progression of the disease. Treatment may include:  °· A special diet. You may need to avoid alcohol and foods that are salty and high in potassium.   °· Medicines. These may:   °¨ Lower blood pressure.   °¨ Relieve anemia.   °¨ Relieve swelling.   °¨ Protect the bones. °HOME CARE INSTRUCTIONS  °· Follow your prescribed diet.   °· Take medicines only as directed by your health care provider. Do not take any new medicines (prescription, over-the-counter, or nutritional supplements) unless approved by your health care provider. Many medicines can worsen your kidney damage or need to have the dose adjusted.   °· Quit smoking if you smoke. Talk to your health care provider about a smoking cessation program.   °· Keep all follow-up visits as directed by your health care provider. °SEEK IMMEDIATE MEDICAL CARE IF: °· Your symptoms get worse or you develop new symptoms.   °· You develop symptoms of end-stage kidney disease. These   include:   °¨ Headaches.   °¨ Abnormally dark or light skin.   °¨ Numbness in the hands or feet.   °¨ Easy bruising.   °¨ Frequent hiccups.   °¨ Menstruation stops.   °· You have a fever.   °· You have decreased urine production.   °· You have pain or bleeding when urinating. °MAKE SURE YOU: °· Understand these instructions. °· Will watch your condition. °· Will  get help right away if you are not doing well or get worse. °FOR MORE INFORMATION  °· American Association of Kidney Patients: www.aakp.org °· National Kidney Foundation: www.kidney.org °· American Kid[employer] Fund: www.akfinc.org °· [employer] Program: www.lifeoptions.org and www.kidneyschool.org °Document Released: 11/10/2007 Document Revised: 06/17/2013 Document Reviewed: 09/30/2011 °ExitCare® Patient Information ©2015 [employer]. This information is not intended to replace advice given to you by your health care provider. Make sure you discuss any questions you have with your health care provider. ° °

## 2014-03-16 NOTE — Progress Notes (Signed)
Collin LeitzWilliam F [name] to be [facility] per MD order.  Discussed with the patient and all questions fully answered.      Medication List    TAKE these medications        acetaminophen 500 MG tablet  Commonly known as:  TYLENOL  Take 2 tablets (1,000 mg total) by mouth every 6 (six) hours as needed. Pain.     albuterol 108 (90 BASE) MCG/ACT inhaler  Commonly known as:  PROVENTIL HFA;VENTOLIN HFA  Inhale 2 puffs into the lungs every 4 (four) hours as needed.     albuterol 0.63 MG/3ML nebulizer solution  Commonly known as:  ACCUNEB  Take 3 mLs (0.63 mg total) by nebulization every 4 (four) hours as needed for wheezing.     aspirin 81 MG tablet  Take 1 tablet (81 mg total) by mouth daily.     atorvastatin 80 MG tablet  Commonly known as:  LIPITOR  Take 1 tablet (80 mg total) by mouth daily at 6 PM.     calcium carbonate 500 MG chewable tablet  Commonly known as:  TUMS - dosed in mg elemental calcium  Chew 1 tablet (200 mg of elemental calcium total) by mouth 2 (two) times daily as needed for indigestion or heartburn.     cloNIDine 0.1 mg/24hr patch  Commonly known as:  CATAPRES - Dosed in mg/24 hr  Place 0.1 mg onto the skin once a week.     DAPTOmycin 600 mg in sodium chloride 0.9 % 100 mL  Inject 600 mg into the vein every other day. For 6 weeks from 02/28/14. Please follow electrolytes, once creatinine better dosage may need to be changed.     feeding supplement (PRO-STAT SUGAR FREE 64) Liqd  Take 30 mLs by mouth daily.     ferrous sulfate 325 (65 FE) MG tablet  Take 325 mg by mouth 2 (two) times daily.     furosemide 40 MG tablet  Commonly known as:  LASIX  Take 120mg  twice a day for 5 days, then take 80mg  twice daily.     hydrALAZINE 50 MG tablet  Commonly known as:  APRESOLINE  Take 2 tablets (100 mg total) by mouth every 8 (eight) hours.     insulin aspart 100 UNIT/ML injection  Commonly known as:  novoLOG  - insulin aspart (novoLOG)  injection 0-20 Units  - 0-20 Units, Subcutaneous, 3 times daily with meals  - CBG < 70: implement hypoglycemia protocol  - CBG 70 - 120: 0 units  - CBG 121 - 150: 3 units  - CBG 151 - 200: 4 units  - CBG 201 - 250: 7 units  - CBG 251 - 300: 11 units  - CBG 301 - 350: 15 units  - CBG 351 - 400: 20 units  - CBG > 400: call MD     Insulin Detemir 100 UNIT/ML Pen  Commonly known as:  LEVEMIR FLEXTOUCH  Inject 16 Units into the skin daily at 10 pm.     isosorbide mononitrate 30 MG 24 hr tablet  Commonly known as:  IMDUR  Take 1 tablet (30 mg total) by mouth daily.     levofloxacin 500 MG tablet  Commonly known as:  LEVAQUIN  Take 1 tablet (500 mg total) by mouth every other day. For 2 more doses.     metoprolol tartrate 25 MG tablet  Commonly known as:  LOPRESSOR  Take 2 tablets (50 mg total) by mouth 2 (  two) times daily.     ondansetron 4 MG tablet  Commonly known as:  ZOFRAN  Take 1 tablet (4 mg total) by mouth every 6 (six) hours as needed for nausea.     polyethylene glycol packet  Commonly known as:  MIRALAX / GLYCOLAX  Take 17 g by mouth daily.     rifampin 300 MG capsule  Commonly known as:  RIFADIN  Take 1 capsule (300 mg total) by mouth every 12 (twelve) hours. 6 weeks from 02/28/14     traMADol 50 MG tablet  Commonly known as:  ULTRAM  Take 1 tablet (50 mg total) by mouth every 12 (twelve) hours as needed for moderate pain.     vitamin C 500 MG tablet  Commonly known as:  ASCORBIC ACID  Take 500 mg by mouth 2 (two) times daily.     vitamin D (CHOLECALCIFEROL) 400 UNITS tablet  Take 400 Units by mouth 2 (two) times daily.        VVS, Right groin wound dressed with Foam Dressing & right medial thigh wound dressed with ABD pads and Kerlex.  Left upper Arm PICC line capped and intact. Site without signs and symptoms of complications.   An After Visit Summary was printed and given to EMS.   Patient instructed to return to ED, call 911, or call MD for  any changes in condition.   Patient escorted via EMS to [facility].  Ruthy Dick A 03/16/2014 3:44 PM

## 2014-03-16 NOTE — Discharge Summary (Signed)
[facility]  Physician Discharge Summary   Patient ID: Collin LeitzWilliam F Demaria MRN: [id number] DOB/AGE: 65/09/1950 65 y.o.  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 BMET in 3 days. 2. Needs follow with a Nephrologist either at [place] or in [place]. 3. Ted stockings to lower extremities. 4. Wound care to right groin as before. May need frequent dressing changes. Compression with TED to the area to evacuate fluid.  DISCHARGE CONDITION: fair  Diet recommendation: Renal/Carb Modified  Filed Weights   03/14/14 0631 03/15/14 0622 03/16/14 0646  Weight: 99.1 kg (218 lb 7.6 oz) 97.2 kg (214 lb 4.6 oz) 94.53 kg (208 lb 6.4 oz)    INITIAL HISTORY: 64 year old African-American male was recently hospitalized for infection of the right groin area after aortofemoral bypass graft surgery. He was discharged to skilled nursing facility on IV antibiotics. He was sent over due to worsening edema and drainage from his right groin wound. He was also short of breath. He was admitted for further management. He was found to have a fluid collection in his right groin. Vascular surgery was consulted. No plans for intervention.  Consultants: Vascular, Plastics  Procedures:  Venous Doppler of lower extremities  No DVT noted.   Arterial Dopplers of right lower extremity Fluid collection noted in the right groin area. See report.  Antibiotics: Daptomycin and rifampin for 6 weeks from 02/28/14.  HOSPITAL COURSE:   Questionable Bronchitis Patient quickly improved with nebs and antibiotics. CXR raised  possibility of pulmonary edema but clinically it did not appear to be so. Continue Levaquin orally for now. Patient no longer has any respiratory symptoms.  Pedal edema/possible acute on chronic diastolic heart failure He was started on high dose lasix IV. This was increased to three times daily. His edema is improving though slowly. His weight has decreased significantly. He will be discharged on high dose lasix with close monitoring of his renal function. No DVT noted on venous Doppler. Echocardiogram from December was noted. Systolic function was normal. There was perhaps a component of diastolic dysfunction.   Hx of R groin abscess w/ MRSA Doppler studies revealed fluid collection in the right groin. Vascular surgery and plastic surgery was consulted. It was felt that this fluid collection was likely a seroma or a lymphatic leak. Considering that he is afebrile and has a normal WBC this does not appear to be a recurrence of his abscess. There is no need for further surgical intervention. Continue with his daptomycin and rifampin as before (for 6 weeks from 02/28/14).   Normocytic Anemia Hemoglobin remains stable.   CKD stage 4/Hyperkalemia Renal function appears to be stable. Potassium was corrected. He did have acute renal failure during his last hospitalization as well, which was thought to be secondary to contrast-induced nephropathy. Renal function actually better from what it was during that hospitalization. Monitor urine output. [facility]  Minimally elevated troponin  Likely secondary to renal insufficiency. No significant elevation noted. He had a cardiac catheterization on January 7, which showed patent grafts. Considering this recent evaluation no further testing was warranted.  Type 2 diabetes mellitus  His CBG's were noted to be low at times. Insulin dose was adjusted.   OSA Cpap at night.  Overall stable. Ok for return to SNF for further  care.   PERTINENT LABS:  The results of significant diagnostics from this hospitalization (including imaging, microbiology, ancillary and laboratory) are listed below for reference.    Microbiology: Recent Results (from the past 240 hour(s))  MRSA PCR Screening     Status: Abnormal   Collection Time: 03/14/14 10:05 AM  Result Value Ref Range Status   MRSA by PCR POSITIVE (A) NEGATIVE Final    Comment:        The GeneXpert MRSA Assay (FDA approved for NASAL specimens only), is one component of a comprehensive MRSA colonization surveillance program. It is not intended to diagnose MRSA infection nor to guide or monitor treatment for MRSA infections. RESULT CALLED TO, READ BACK BY AND VERIFIED WITH: T. LUCAS RN 13:00 03/14/14 ([email or web address])      Labs: Basic Metabolic Panel:  Recent Labs Lab 03/12/14 0502 03/13/14 0525 03/14/14 0900 03/15/14 0525 03/16/14 0440  NA 140 141 139 137 138  K 5.3* 4.6 4.6 4.4 4.1  CL 107 108 105 102 101  CO2 GLUCOSE 144* 125* 101* 135* 102*  BUN 53* 51* 49* 51* 56*  CREATININE 2.88* 2.81* 2.67* 2.91* 2.82*  CALCIUM 8.9 8.9 8.6 8.5 8.6   Liver Function Tests:  Recent Labs Lab 03/12/14 0502  AST 22  ALT 17  ALKPHOS 132*  BILITOT 0.6  PROT 7.1  ALBUMIN 2.5*   CBC:  Recent Labs Lab 03/11/14 2225 03/12/14 0502 03/13/14 0525 03/15/14 0525  WBC 8.8 8.6 7.7 9.2  NEUTROABS  --  5.8  --   --   HGB 7.5* 7.5* 7.3* 7.5*  HCT 24.6* 25.1* 24.1* 24.5*  MCV 82.8 83.4 85.2 86.0  PLT 310 306 290 300   Cardiac Enzymes:  Recent Labs Lab 03/11/14 2342 03/12/14 0502 03/12/14 1105 03/12/14 1617  CKTOTAL  --  127 130 129  CKMB  --  5.6* 5.9* 7.1*  TROPONINI 0.09* 0.09* 0.09* 0.08*   CBG:  Recent Labs Lab 03/15/14 1150 03/15/14 1704 03/15/14 2237 03/16/14 0749 03/16/14 0828  GLUCAP 152* 149* 149* 68* 90     IMAGING STUDIES Dg Chest Port 1 View  03/11/2014   CLINICAL DATA:  Infected stent in the leg. Dialysis  patient. Patient has missed 3 dialysis appointment due to lack of access. Concern for fluid overload. Short of breath.  EXAM: PORTABLE CHEST - 1 VIEW  COMPARISON:  02/27/2014  FINDINGS: Postoperative changes in the mediastinum. Cardiac pacemaker. Left PICC catheter. Appliances are unchanged since prior study. Cardiac enlargement with prominent pulmonary vascularity and perihilar infiltration consistent with edema. Small bilateral pleural effusions and basilar atelectasis, greater on the left, and increasing since prior study.  IMPRESSION: Cardiac enlargement with pulmonary vascular congestion and edema. Small bilateral pleural effusions and basilar atelectasis, increasing since prior study.   Electronically Signed   By: Burman Nieves M.D.   On: 03/11/2014 22:34     DISCHARGE EXAMINATION: Filed Vitals:   03/15/14 1539 03/15/14 2318 03/16/14 0530 03/16/14 0646  BP: 130/83 148/70 158/70   Pulse: 71 70 65   Temp: 98.6 F (37 C) 98.7 F (37.1 C) 99 F (37.2 C)   TempSrc: Oral Oral Oral   Resp: Height:      Weight:    94.53  kg (208 lb 6.4 oz)  SpO2: 90% 84% 97%    General appearance: alert, cooperative, appears stated age and no distress Head: Normocephalic, without obvious abnormality, atraumatic Resp: decreased air entry at bases. No wheezing. Cardio: regular rate and rhythm, S1, S2 normal, no murmur, click, rub or gallop GI: soft, non-tender; bowel sounds normal; no masses,  no organomegaly Extremities: edema 2+ in bil LE. But improving. Wound in right groin covered with dressing.  DISPOSITION: SNF  Discharge Instructions    Call MD for:  difficulty breathing, headache or visual disturbances    Complete by:  As directed      Call MD for:  extreme fatigue    Complete by:  As directed      Call MD for:  persistant dizziness or light-headedness    Complete by:  As directed      Call MD for:  severe uncontrolled pain    Complete by:  As directed      Call MD for:   temperature >100.4    Complete by:  As directed      Diet - low sodium heart healthy    Complete by:  As directed      Discharge instructions    Complete by:  As directed   Please check CBG's per protocol. Please check BMET in 3 days to check renal function. Please make sure patient has follow up with a nephrologist either at [facility] or in [place] within next 2 weeks.  Earlier if renal function is noted to get worse. TED stockings to both legs, thigh high. Wound care and dressing changes to right upper thigh as before. PICC care.     Increase activity slowly    Complete by:  As directed            ALLERGIES:  Allergies  Allergen Reactions  . Morphine And Related     Pt felt flushed and was diaphoretic      Current Discharge Medication List    START taking these medications   Details  levofloxacin (LEVAQUIN) 500 MG tablet Take 1 tablet (500 mg total) by mouth every other day. For 2 more doses.      CONTINUE these medications which have CHANGED   Details  albuterol (ACCUNEB) 0.63 MG/3ML nebulizer solution Take 3 mLs (0.63 mg total) by nebulization every 4 (four) hours as needed for wheezing. Qty: 75 mL, Refills: 12    DAPTOmycin 600 mg in sodium chloride 0.9 % 100 mL Inject 600 mg into the vein every other day. For 6 weeks from 02/28/14. Please follow electrolytes, once creatinine better dosage may need to be changed.    furosemide (LASIX) 40 MG tablet Take  twice a day for 5 days, then take  twice daily.    hydrALAZINE (APRESOLINE) 50 MG tablet Take 2 tablets (100 mg total) by mouth every 8 (eight) hours.    insulin aspart (NOVOLOG) 100 UNIT/ML injection insulin aspart (novoLOG) injection 0-20 Units 0-20 Units, Subcutaneous, 3 times daily with meals CBG < 70: implement hypoglycemia protocol CBG 70 - 120: 0 units CBG 121 - 150: 3 units CBG 151 - 200: 4 units CBG 201 - 250: 7 units CBG 251 - 300: 11 units CBG 301 - 350: 15 units CBG 351 - 400: 20 units CBG > 400:  call MD Qty: 10 mL, Refills: 11    Insulin Detemir (LEVEMIR FLEXTOUCH) 100 UNIT/ML Pen Inject 16 Units into the skin daily at 10 pm. Qty: 15 mL, Refills:  11    traMADol (ULTRAM) 50 MG tablet Take 1 tablet (50 mg total) by mouth every 12 (twelve) hours as needed for moderate pain. Qty: 30 tablet, Refills: 0      CONTINUE these medications which have NOT CHANGED   Details  acetaminophen (TYLENOL) 500 MG tablet Take 2 tablets (1,000 mg total) by mouth every 6 (six) hours as needed. Pain. Qty: 90 tablet, Refills: 3    albuterol (PROVENTIL HFA;VENTOLIN HFA) 108 (90 BASE) MCG/ACT inhaler Inhale 2 puffs into the lungs every 4 (four) hours as needed. Qty: 18 g, Refills: 3    Amino Acids-Protein Hydrolys (FEEDING SUPPLEMENT, PRO-STAT SUGAR FREE 64,) LIQD Take 30 mLs by mouth daily.    aspirin 81 MG tablet Take 1 tablet (81 mg total) by mouth daily. Qty: 90 tablet, Refills: 3    atorvastatin (LIPITOR) 80 MG tablet Take 1 tablet (80 mg total) by mouth daily at 6 PM.    calcium carbonate (TUMS - DOSED IN MG ELEMENTAL CALCIUM) 500 MG chewable tablet Chew 1 tablet (200 mg of elemental calcium total) by mouth 2 (two) times daily as needed for indigestion or heartburn.    cloNIDine (CATAPRES - DOSED IN MG/24 HR) 0.1 mg/24hr patch Place 0.1 mg onto the skin once a week.    ferrous sulfate 325 (65 FE) MG tablet Take 325 mg by mouth 2 (two) times daily. Refills: 0    isosorbide mononitrate (IMDUR) 30 MG 24 hr tablet Take 1 tablet (30 mg total) by mouth daily.    metoprolol tartrate (LOPRESSOR) 25 MG tablet Take 2 tablets (50 mg total) by mouth 2 (two) times daily. Qty: 180 tablet, Refills: 3    ondansetron (ZOFRAN) 4 MG tablet Take 1 tablet (4 mg total) by mouth every 6 (six) hours as needed for nausea. Qty: 20 tablet, Refills: 0    polyethylene glycol (MIRALAX / GLYCOLAX) packet Take 17 g by mouth daily. Qty: 14 each, Refills: 0    rifampin (RIFADIN) 300 MG capsule Take 1 capsule (300 mg  total) by mouth every 12 (twelve) hours. 6 weeks from 02/28/14    vitamin C (ASCORBIC ACID) 500 MG tablet Take 500 mg by mouth 2 (two) times daily.    vitamin D, CHOLECALCIFEROL, 400 UNITS tablet Take 400 Units by mouth 2 (two) times daily.       Follow-up Information    Follow up with Milinda Antis, MD. Schedule an appointment as soon as possible for a visit in 1 week.   Specialty:  Family Medicine   Why:  post hospitalization follow up   Contact information:   [address] [place] [address] [phone]       Follow up with [facility], Irean Hong, MD. Schedule an appointment as soon as possible for a visit in 2 weeks.   Specialty:  Plastic Surgery   Why:  For wound re-check   Contact information:   [address] [place] [address] [phone]       Follow up with DICKSON,CHRISTOPHER S, MD. Schedule an appointment as soon as possible for a visit in 2 weeks.   Specialty:  Vascular Surgery   Why:  post hospitalization follow up, For wound re-check   Contact information:   [address] [place] [address] [phone]       TOTAL DISCHARGE TIME: 35 mins  [facility]  [facility] Pager [phone]  03/16/2014, 9:02 AM

## 2014-03-18 ENCOUNTER — Encounter: Payer: Self-pay | Admitting: Vascular Surgery

## 2014-03-19 ENCOUNTER — Encounter: Payer: Medicare HMO | Admitting: Vascular Surgery

## 2014-03-19 ENCOUNTER — Telehealth: Payer: Self-pay | Admitting: *Deleted

## 2014-03-19 NOTE — Telephone Encounter (Signed)
Very good 

## 2014-03-19 NOTE — Telephone Encounter (Signed)
I've now seen them and looks like his creatinine is stable. If there is specific concern re labs

## 2014-03-19 NOTE — Telephone Encounter (Signed)
No. She just wanted to make sure that you were included on the labs, as he is to follow up at [facility].

## 2014-03-19 NOTE — Telephone Encounter (Signed)
April, LPN supervisor at [facility] [place] called to see if Dr. Daiva EvesVan Dam has been getting lab work on the patient.  She states that the patient was [facility]hospital[facility], but came back to the SNF without direct orders from Dr. Daiva EvesVan Dam. She wants to make sure he is getting the labs.  RN confirmed fax number, upcoming appointment on 2/18 with Dr. Lennox LaityVan Dam. Casten Floren, Zachary GeorgeMichelle M, RN

## 2014-03-21 ENCOUNTER — Emergency Department (HOSPITAL_COMMUNITY): Payer: Medicare HMO

## 2014-03-21 ENCOUNTER — Encounter (HOSPITAL_COMMUNITY): Payer: Self-pay | Admitting: *Deleted

## 2014-03-21 ENCOUNTER — Inpatient Hospital Stay (HOSPITAL_COMMUNITY)
Admission: EM | Admit: 2014-03-21 | Discharge: 2014-04-15 | DRG: 871 | Disposition: E | Payer: Medicare HMO | Attending: Internal Medicine | Admitting: Internal Medicine

## 2014-03-21 DIAGNOSIS — I70209 Unspecified atherosclerosis of native arteries of extremities, unspecified extremity: Secondary | ICD-10-CM | POA: Diagnosis present

## 2014-03-21 DIAGNOSIS — I2510 Atherosclerotic heart disease of native coronary artery without angina pectoris: Secondary | ICD-10-CM | POA: Diagnosis present

## 2014-03-21 DIAGNOSIS — J9601 Acute respiratory failure with hypoxia: Secondary | ICD-10-CM | POA: Diagnosis present

## 2014-03-21 DIAGNOSIS — J449 Chronic obstructive pulmonary disease, unspecified: Secondary | ICD-10-CM | POA: Diagnosis present

## 2014-03-21 DIAGNOSIS — G9340 Encephalopathy, unspecified: Secondary | ICD-10-CM | POA: Diagnosis present

## 2014-03-21 DIAGNOSIS — Z794 Long term (current) use of insulin: Secondary | ICD-10-CM

## 2014-03-21 DIAGNOSIS — I679 Cerebrovascular disease, unspecified: Secondary | ICD-10-CM | POA: Diagnosis present

## 2014-03-21 DIAGNOSIS — Z885 Allergy status to narcotic agent status: Secondary | ICD-10-CM

## 2014-03-21 DIAGNOSIS — G4733 Obstructive sleep apnea (adult) (pediatric): Secondary | ICD-10-CM | POA: Diagnosis present

## 2014-03-21 DIAGNOSIS — E875 Hyperkalemia: Secondary | ICD-10-CM | POA: Diagnosis present

## 2014-03-21 DIAGNOSIS — Z950 Presence of cardiac pacemaker: Secondary | ICD-10-CM

## 2014-03-21 DIAGNOSIS — Z951 Presence of aortocoronary bypass graft: Secondary | ICD-10-CM | POA: Diagnosis not present

## 2014-03-21 DIAGNOSIS — Z87891 Personal history of nicotine dependence: Secondary | ICD-10-CM | POA: Diagnosis not present

## 2014-03-21 DIAGNOSIS — E785 Hyperlipidemia, unspecified: Secondary | ICD-10-CM | POA: Diagnosis present

## 2014-03-21 DIAGNOSIS — N179 Acute kidney failure, unspecified: Secondary | ICD-10-CM | POA: Diagnosis present

## 2014-03-21 DIAGNOSIS — N184 Chronic kidney disease, stage 4 (severe): Secondary | ICD-10-CM | POA: Diagnosis present

## 2014-03-21 DIAGNOSIS — G629 Polyneuropathy, unspecified: Secondary | ICD-10-CM | POA: Diagnosis present

## 2014-03-21 DIAGNOSIS — I509 Heart failure, unspecified: Secondary | ICD-10-CM | POA: Insufficient documentation

## 2014-03-21 DIAGNOSIS — I5031 Acute diastolic (congestive) heart failure: Secondary | ICD-10-CM | POA: Diagnosis present

## 2014-03-21 DIAGNOSIS — D72829 Elevated white blood cell count, unspecified: Secondary | ICD-10-CM | POA: Diagnosis present

## 2014-03-21 DIAGNOSIS — Z6833 Body mass index (BMI) 33.0-33.9, adult: Secondary | ICD-10-CM

## 2014-03-21 DIAGNOSIS — E43 Unspecified severe protein-calorie malnutrition: Secondary | ICD-10-CM | POA: Diagnosis present

## 2014-03-21 DIAGNOSIS — Z8249 Family history of ischemic heart disease and other diseases of the circulatory system: Secondary | ICD-10-CM

## 2014-03-21 DIAGNOSIS — A419 Sepsis, unspecified organism: Secondary | ICD-10-CM | POA: Diagnosis present

## 2014-03-21 DIAGNOSIS — I130 Hypertensive heart and chronic kidney disease with heart failure and stage 1 through stage 4 chronic kidney disease, or unspecified chronic kidney disease: Secondary | ICD-10-CM | POA: Diagnosis present

## 2014-03-21 DIAGNOSIS — I10 Essential (primary) hypertension: Secondary | ICD-10-CM

## 2014-03-21 DIAGNOSIS — M179 Osteoarthritis of knee, unspecified: Secondary | ICD-10-CM | POA: Diagnosis present

## 2014-03-21 DIAGNOSIS — Z9981 Dependence on supplemental oxygen: Secondary | ICD-10-CM

## 2014-03-21 DIAGNOSIS — N400 Enlarged prostate without lower urinary tract symptoms: Secondary | ICD-10-CM | POA: Diagnosis present

## 2014-03-21 DIAGNOSIS — D638 Anemia in other chronic diseases classified elsewhere: Secondary | ICD-10-CM | POA: Diagnosis present

## 2014-03-21 DIAGNOSIS — I495 Sick sinus syndrome: Secondary | ICD-10-CM | POA: Diagnosis present

## 2014-03-21 DIAGNOSIS — I5033 Acute on chronic diastolic (congestive) heart failure: Secondary | ICD-10-CM | POA: Diagnosis present

## 2014-03-21 DIAGNOSIS — J189 Pneumonia, unspecified organism: Secondary | ICD-10-CM | POA: Diagnosis present

## 2014-03-21 DIAGNOSIS — E1159 Type 2 diabetes mellitus with other circulatory complications: Secondary | ICD-10-CM

## 2014-03-21 DIAGNOSIS — Z79891 Long term (current) use of opiate analgesic: Secondary | ICD-10-CM

## 2014-03-21 DIAGNOSIS — Z833 Family history of diabetes mellitus: Secondary | ICD-10-CM

## 2014-03-21 DIAGNOSIS — R410 Disorientation, unspecified: Secondary | ICD-10-CM | POA: Insufficient documentation

## 2014-03-21 DIAGNOSIS — K219 Gastro-esophageal reflux disease without esophagitis: Secondary | ICD-10-CM | POA: Diagnosis present

## 2014-03-21 DIAGNOSIS — Z79899 Other long term (current) drug therapy: Secondary | ICD-10-CM

## 2014-03-21 DIAGNOSIS — Z7982 Long term (current) use of aspirin: Secondary | ICD-10-CM | POA: Diagnosis not present

## 2014-03-21 DIAGNOSIS — R0602 Shortness of breath: Secondary | ICD-10-CM

## 2014-03-21 DIAGNOSIS — R918 Other nonspecific abnormal finding of lung field: Secondary | ICD-10-CM

## 2014-03-21 DIAGNOSIS — J811 Chronic pulmonary edema: Secondary | ICD-10-CM

## 2014-03-21 DIAGNOSIS — E1151 Type 2 diabetes mellitus with diabetic peripheral angiopathy without gangrene: Secondary | ICD-10-CM | POA: Diagnosis present

## 2014-03-21 DIAGNOSIS — E119 Type 2 diabetes mellitus without complications: Secondary | ICD-10-CM | POA: Diagnosis present

## 2014-03-21 LAB — CBC
HCT: 26.3 % — ABNORMAL LOW (ref 39.0–52.0)
HEMOGLOBIN: 7.9 g/dL — AB (ref 13.0–17.0)
MCH: 25.2 pg — AB (ref 26.0–34.0)
MCHC: 30 g/dL (ref 30.0–36.0)
MCV: 83.8 fL (ref 78.0–100.0)
PLATELETS: 391 10*3/uL (ref 150–400)
RBC: 3.14 MIL/uL — ABNORMAL LOW (ref 4.22–5.81)
RDW: 16.8 % — ABNORMAL HIGH (ref 11.5–15.5)
WBC: 13.5 10*3/uL — AB (ref 4.0–10.5)

## 2014-03-21 LAB — BASIC METABOLIC PANEL
ANION GAP: 8 (ref 5–15)
BUN: 70 mg/dL — ABNORMAL HIGH (ref 6–23)
CO2: 28 mmol/L (ref 19–32)
Calcium: 8.4 mg/dL (ref 8.4–10.5)
Chloride: 100 mmol/L (ref 96–112)
Creatinine, Ser: 2.92 mg/dL — ABNORMAL HIGH (ref 0.50–1.35)
GFR calc Af Amer: 25 mL/min — ABNORMAL LOW (ref >90)
GFR calc non Af Amer: 21 mL/min — ABNORMAL LOW (ref >90)
GLUCOSE: 230 mg/dL — AB (ref 70–99)
POTASSIUM: 4 mmol/L (ref 3.5–5.1)
Sodium: 136 mmol/L (ref 135–145)

## 2014-03-21 LAB — I-STAT TROPONIN, ED: Troponin i, poc: 0.09 ng/mL (ref 0.00–0.08)

## 2014-03-21 LAB — URINALYSIS, ROUTINE W REFLEX MICROSCOPIC
BILIRUBIN URINE: NEGATIVE
Glucose, UA: NEGATIVE mg/dL
HGB URINE DIPSTICK: NEGATIVE
KETONES UR: NEGATIVE mg/dL
Nitrite: NEGATIVE
PH: 5 (ref 5.0–8.0)
Protein, ur: 100 mg/dL — AB
Specific Gravity, Urine: 1.016 (ref 1.005–1.030)
UROBILINOGEN UA: 1 mg/dL (ref 0.0–1.0)

## 2014-03-21 LAB — URINE MICROSCOPIC-ADD ON

## 2014-03-21 LAB — TROPONIN I: TROPONIN I: 0.15 ng/mL — AB (ref <0.031)

## 2014-03-21 LAB — I-STAT CG4 LACTIC ACID, ED: LACTIC ACID, VENOUS: 0.62 mmol/L (ref 0.5–2.0)

## 2014-03-21 LAB — MRSA PCR SCREENING: MRSA by PCR: NEGATIVE

## 2014-03-21 LAB — BRAIN NATRIURETIC PEPTIDE: B NATRIURETIC PEPTIDE 5: 2224.3 pg/mL — AB (ref 0.0–100.0)

## 2014-03-21 MED ORDER — POLYETHYLENE GLYCOL 3350 17 G PO PACK: 17.0000 g | PACK | Freq: Every day | ORAL | Status: DC | PRN

## 2014-03-21 MED ORDER — ACETAMINOPHEN 325 MG PO TABS
650.0000 mg | ORAL_TABLET | Freq: Four times a day (QID) | ORAL | Status: DC | PRN
  Administered 2014-03-22: 650 mg via ORAL
  Filled 2014-03-21 (×2): qty 2

## 2014-03-21 MED ORDER — SODIUM CHLORIDE 0.9 % IJ SOLN: 3.0000 mL | INTRAMUSCULAR | Status: DC | PRN

## 2014-03-21 MED ORDER — ALBUTEROL SULFATE (2.5 MG/3ML) 0.083% IN NEBU
0.7500 mL | INHALATION_SOLUTION | RESPIRATORY_TRACT | Status: DC | PRN
  Administered 2014-03-24: 3 mL via RESPIRATORY_TRACT
  Filled 2014-03-21: qty 3

## 2014-03-21 MED ORDER — SODIUM CHLORIDE 0.9 % IV SOLN
600.0000 mg | INTRAVENOUS | Status: DC
  Administered 2014-03-21: 600 mg via INTRAVENOUS
  Filled 2014-03-21: qty 12

## 2014-03-21 MED ORDER — INSULIN ASPART 100 UNIT/ML ~~LOC~~ SOLN
0.0000 [IU] | Freq: Every day | SUBCUTANEOUS | Status: DC
  Administered 2014-03-21 – 2014-03-30 (×3): 2 [IU] via SUBCUTANEOUS

## 2014-03-21 MED ORDER — DAPTOMYCIN 500 MG IV SOLR
400.0000 mg | INTRAVENOUS | Status: DC
  Administered 2014-03-23 – 2014-03-29 (×4): 400 mg via INTRAVENOUS
  Filled 2014-03-21 (×5): qty 8

## 2014-03-21 MED ORDER — RIFAMPIN 300 MG PO CAPS
300.0000 mg | ORAL_CAPSULE | Freq: Two times a day (BID) | ORAL | Status: DC
  Administered 2014-03-21 – 2014-03-31 (×18): 300 mg via ORAL
  Filled 2014-03-21 (×23): qty 1

## 2014-03-21 MED ORDER — FUROSEMIDE 10 MG/ML IJ SOLN
4.0000 mg/h | INTRAVENOUS | Status: DC
  Administered 2014-03-21: 4 mg/h via INTRAVENOUS
  Filled 2014-03-21: qty 25

## 2014-03-21 MED ORDER — FUROSEMIDE 10 MG/ML IJ SOLN: 40.0000 mg | INTRAMUSCULAR | Status: DC

## 2014-03-21 MED ORDER — FUROSEMIDE 10 MG/ML IJ SOLN
40.0000 mg | INTRAMUSCULAR | Status: AC
  Administered 2014-03-21: 40 mg via INTRAVENOUS
  Filled 2014-03-21: qty 4

## 2014-03-21 MED ORDER — ATORVASTATIN CALCIUM 40 MG PO TABS
80.0000 mg | ORAL_TABLET | Freq: Every day | ORAL | Status: DC
  Administered 2014-03-22 – 2014-03-30 (×9): 80 mg via ORAL
  Filled 2014-03-21 (×12): qty 2

## 2014-03-21 MED ORDER — ONDANSETRON HCL 4 MG/2ML IJ SOLN: 4.0000 mg | Freq: Four times a day (QID) | INTRAMUSCULAR | Status: DC | PRN

## 2014-03-21 MED ORDER — METOPROLOL TARTRATE 50 MG PO TABS
50.0000 mg | ORAL_TABLET | Freq: Two times a day (BID) | ORAL | Status: DC
  Administered 2014-03-21 – 2014-03-31 (×18): 50 mg via ORAL
  Filled 2014-03-21 (×2): qty 1
  Filled 2014-03-21 (×2): qty 2
  Filled 2014-03-21 (×2): qty 1
  Filled 2014-03-21: qty 2
  Filled 2014-03-21 (×7): qty 1
  Filled 2014-03-21: qty 2
  Filled 2014-03-21 (×4): qty 1

## 2014-03-21 MED ORDER — ACETAMINOPHEN 650 MG RE SUPP: 650.0000 mg | Freq: Four times a day (QID) | RECTAL | Status: DC | PRN

## 2014-03-21 MED ORDER — SODIUM CHLORIDE 0.9 % IJ SOLN
3.0000 mL | Freq: Two times a day (BID) | INTRAMUSCULAR | Status: DC
  Administered 2014-03-23: 3 mL via INTRAVENOUS

## 2014-03-21 MED ORDER — ONDANSETRON HCL 4 MG PO TABS: 4.0000 mg | ORAL_TABLET | Freq: Four times a day (QID) | ORAL | Status: DC | PRN

## 2014-03-21 MED ORDER — VITAMIN C 500 MG PO TABS
500.0000 mg | ORAL_TABLET | Freq: Two times a day (BID) | ORAL | Status: DC
  Administered 2014-03-21 – 2014-03-31 (×18): 500 mg via ORAL
  Filled 2014-03-21 (×19): qty 1

## 2014-03-21 MED ORDER — POLYETHYLENE GLYCOL 3350 17 G PO PACK
17.0000 g | PACK | Freq: Every day | ORAL | Status: DC
  Administered 2014-03-22 – 2014-03-30 (×7): 17 g via ORAL
  Filled 2014-03-21 (×10): qty 1

## 2014-03-21 MED ORDER — INSULIN DETEMIR 100 UNIT/ML FLEXPEN: 16.0000 [IU] | PEN_INJECTOR | Freq: Every day | SUBCUTANEOUS | Status: DC

## 2014-03-21 MED ORDER — ISOSORB DINITRATE-HYDRALAZINE 20-37.5 MG PO TABS
1.0000 | ORAL_TABLET | Freq: Two times a day (BID) | ORAL | Status: DC
  Administered 2014-03-21 – 2014-03-26 (×11): 1 via ORAL
  Filled 2014-03-21 (×12): qty 1

## 2014-03-21 MED ORDER — SODIUM CHLORIDE 0.9 % IV SOLN: 250.0000 mL | INTRAVENOUS | Status: DC | PRN

## 2014-03-21 MED ORDER — INSULIN ASPART 100 UNIT/ML ~~LOC~~ SOLN
0.0000 [IU] | Freq: Three times a day (TID) | SUBCUTANEOUS | Status: DC
  Administered 2014-03-22: 2 [IU] via SUBCUTANEOUS
  Administered 2014-03-22 – 2014-03-23 (×4): 4 [IU] via SUBCUTANEOUS
  Administered 2014-03-24: 3 [IU] via SUBCUTANEOUS
  Administered 2014-03-25 (×2): 4 [IU] via SUBCUTANEOUS
  Administered 2014-03-27: 3 [IU] via SUBCUTANEOUS
  Administered 2014-03-29 (×2): 4 [IU] via SUBCUTANEOUS
  Administered 2014-03-30: 3 [IU] via SUBCUTANEOUS
  Administered 2014-03-30: 4 [IU] via SUBCUTANEOUS
  Administered 2014-03-30: 3 [IU] via SUBCUTANEOUS

## 2014-03-21 MED ORDER — ASPIRIN EC 81 MG PO TBEC
81.0000 mg | DELAYED_RELEASE_TABLET | Freq: Every day | ORAL | Status: DC
  Administered 2014-03-22 – 2014-03-29 (×8): 81 mg via ORAL
  Filled 2014-03-21 (×9): qty 1

## 2014-03-21 MED ORDER — SODIUM CHLORIDE 0.9 % IJ SOLN
3.0000 mL | Freq: Two times a day (BID) | INTRAMUSCULAR | Status: DC
  Administered 2014-03-24 – 2014-03-29 (×3): 3 mL via INTRAVENOUS

## 2014-03-21 MED ORDER — LEVOFLOXACIN 500 MG PO TABS
500.0000 mg | ORAL_TABLET | ORAL | Status: DC
  Administered 2014-03-21: 500 mg via ORAL
  Filled 2014-03-21 (×2): qty 1

## 2014-03-21 MED ORDER — TRAMADOL HCL 50 MG PO TABS
50.0000 mg | ORAL_TABLET | Freq: Two times a day (BID) | ORAL | Status: DC | PRN
  Administered 2014-03-25 – 2014-03-29 (×2): 50 mg via ORAL
  Filled 2014-03-21 (×2): qty 1

## 2014-03-21 MED ORDER — CALCIUM CARBONATE ANTACID 500 MG PO CHEW: 1.0000 | CHEWABLE_TABLET | Freq: Two times a day (BID) | ORAL | Status: DC | PRN

## 2014-03-21 MED ORDER — INSULIN DETEMIR 100 UNIT/ML ~~LOC~~ SOLN
16.0000 [IU] | Freq: Every day | SUBCUTANEOUS | Status: DC
Start: 2014-03-21 — End: 2014-03-22
  Administered 2014-03-21: 16 [IU] via SUBCUTANEOUS
  Filled 2014-03-21: qty 0.16

## 2014-03-21 MED ORDER — PRO-STAT SUGAR FREE PO LIQD
30.0000 mL | Freq: Every day | ORAL | Status: DC
  Administered 2014-03-22 – 2014-03-29 (×5): 30 mL via ORAL
  Filled 2014-03-21 (×18): qty 30

## 2014-03-21 NOTE — Progress Notes (Signed)
  CARE MANAGEMENT ED NOTE 04/05/2014  Patient:  Bonney LeitzWITHERS,Layth F   Account Number:  [id number]  Date Initiated:  01-27-15  Documentation initiated by:  Radford PaxFERRERO,Shariah Assad  Subjective/Objective Assessment:   Patient presents to Ed with lethargy, fever, hypoxia, shortness of breath for two days.     Subjective/Objective Assessment Detail:   Patient with pmhx of DM, anemia, HTN,obstructive sleep apnea, PVD, DJD,  Room air pulse ox 86% placed on 4 liters via nasal cannula  Patient noted to have been admitted and discharged from 01/26 to 01/31.     Action/Plan:   Action/Plan Detail:   Anticipated DC Date:       Status Recommendation to Physician:   Result of Recommendation:    Other ED Services  Consult Working Plan    DC Planning Services  Other  PCP issues    Choice offered to / List presented to:            Status of service:  Completed, signed off  ED Comments:   ED Comments Detail:  EDCM spoke to patient's family in the ED.  Patient having foley catheter placed at this time.  Patient currently at [facility] in [place] for short term rehab per patient's wife and sister.  Patient sees Dr. Selena BattenKim at the facility but [name].  Patient's wife reports patient last saw Dr. Jeanice Limurham on Dec 9th.  Patient's wife reports upcoming appointments with Dr. Edilia Boickson 2/17, Dr. Daiva EvesVan Dam 02/18and Dr. Leta Baptisthimmappa, plasctic surgeon [facility] on Feb 10th. This information was placed in readmission focuus note.  No further EDCM needs at this time.

## 2014-03-21 NOTE — Progress Notes (Signed)
ANTIBIOTIC CONSULT NOTE - INITIAL  Pharmacy Consult for Daptomycin Indication: MRSA groin abscess  Allergies  Allergen Reactions  . Morphine And Related     Pt felt flushed and was diaphoretic     Patient Measurements: Height:  (165.1 cm) Weight: 207 lb 0.2 oz (93.9 kg) IBW/kg (Calculated) : 61.5  Vital Signs: Temp: 98.3 F (36.8 C) (02/05 2010) Temp Source: Oral (02/05 2010) BP: 155/99 mmHg (02/05 1900) Pulse Rate: 70 (02/05 1843) Intake/Output from previous day:   Intake/Output from this shift:    Labs:  Recent Labs  04/04/2014 1509  WBC 13.5*  HGB 7.9*  PLT 391  CREATININE 2.92*   Estimated Creatinine Clearance: 26.9 mL/min (by C-G formula based on Cr of 2.92). No results for input(s): VANCOTROUGH, VANCOPEAK, VANCORANDOM, GENTTROUGH, GENTPEAK, GENTRANDOM, TOBRATROUGH, TOBRAPEAK, TOBRARND, AMIKACINPEAK, AMIKACINTROU, AMIKACIN in the last 72 hours.   Microbiology: Recent Results (from the past 720 hour(s))  MRSA PCR Screening     Status: None   Collection Time: 02/21/14  4:11 AM  Result Value Ref Range Status   MRSA by PCR NEGATIVE NEGATIVE Final    Comment:        The GeneXpert MRSA Assay (FDA approved for NASAL specimens only), is one component of a comprehensive MRSA colonization surveillance program. It is not intended to diagnose MRSA infection nor to guide or monitor treatment for MRSA infections.   Clostridium Difficile by PCR     Status: None   Collection Time: 03/03/14  5:31 PM  Result Value Ref Range Status   C difficile by pcr NEGATIVE NEGATIVE Final  MRSA PCR Screening     Status: Abnormal   Collection Time: 03/14/14 10:05 AM  Result Value Ref Range Status   MRSA by PCR POSITIVE (A) NEGATIVE Final    Comment:        The GeneXpert MRSA Assay (FDA approved for NASAL specimens only), is one component of a comprehensive MRSA colonization surveillance program. It is not intended to diagnose MRSA infection nor to guide or monitor  treatment for MRSA infections. RESULT CALLED TO, READ BACK BY AND VERIFIED WITH: T. LUCAS RN 13:00 03/14/14 ([email or web address])     Medical History: Past Medical History  Diagnosis Date  . Arteriosclerotic cardiovascular disease (ASCVD)     CABG in 2003 for 3 VD; 07/2007: Stress nuclear-inferior infarction without ischemia; normal EF by echo in 06/2005  . Diabetes mellitus type II   . Hyperlipidemia   . Degenerative joint disease     Left knee  . Cerebrovascular disease 2003    Carotid ultrasound in 2010: Patent right CEA site, 60-79% left internal carotid artery  . Peripheral vascular disease 2007    aortobifemoral BPG-2007  . Anemia   . Peripheral neuropathy   . Hypertension   . Sick sinus syndrome 1996    permanent pacemaker-1996; generator change in 2010  . Chronic obstructive pulmonary disease   . Obesity   . Obstructive sleep apnea   . Benign prostatic hypertrophy   . Gastroesophageal reflux disease   . Tobacco abuse, in remission     50 pack years total consumption; Quit in 2007    Medications:  Scheduled:  . [START ON 03/22/2014] aspirin EC  81 mg Oral Daily  . [START ON 03/22/2014] atorvastatin  80 mg Oral q1800  . DAPTOmycin (CUBICIN)  IV  600 mg Intravenous Q48H  . [START ON 03/22/2014] feeding supplement (PRO-STAT SUGAR FREE 64)  30 mL Oral Daily  . furosemide  40  mg Intravenous STAT  . insulin detemir  16 Units Subcutaneous QHS  . isosorbide-hydrALAZINE  1 tablet Oral BID  . levofloxacin  500 mg Oral Q48H  . metoprolol tartrate  50 mg Oral BID  . [START ON 03/22/2014] polyethylene glycol  17 g Oral Daily  . rifampin  300 mg Oral Q12H  . sodium chloride  3 mL Intravenous Q12H  . sodium chloride  3 mL Intravenous Q12H  . vitamin C  500 mg Oral BID   Infusions:  . furosemide (LASIX) infusion     PRN: sodium chloride, acetaminophen **OR** acetaminophen, albuterol, calcium carbonate, ondansetron **OR** ondansetron (ZOFRAN) IV, polyethylene glycol, sodium chloride,  traMADol  Assessment: 64 y.o M with hx of thrombectomy of the left femoral limb and subsequently developed right groin abscess with suspected right femoral graft infection. S/p I&D on 02/15/14 and cultures grew out MRSA. He was on vancomycin but was switched to daptomycin d/t acute renal failure and acummulation of vancomycin. Plan is to treat with daptomycin and rifampin for 6 weeks, start date 02/28/14 (stop date would be 04/11/14). Pharmacy is consulted to continue dosing daptomycin during this admission.  He is also on levaquin day 10 of 14 for pneumonia.  [facility] with worsening SOB with inability to lie flat to sleep, likely acute diastolic heart failure.  Goal of Therapy:  Eradication of infection Dose appropriate for indication, renal function  Plan:   Continue with daptomycin 600mg  IV q48h for tonight as ordered by MD  On 2/7, decrease daptomycin to 400mg  (4 mg/kg for soft tissue infection) IV q48h  Check ck in AM and weekly  Continue to monitor renal function and adjust dosing interval as needed  Loralee PacasErin Birney Belshe, PharmD, BCPS Pager: [phone] 03/30/2014,8:49 PM

## 2014-03-21 NOTE — ED Notes (Signed)
Patient transported to X-ray 

## 2014-03-21 NOTE — ED Notes (Addendum)
West, E. Made aware of patient troponin results.

## 2014-03-21 NOTE — ED Notes (Signed)
Bed: WA16 Expected date:  Expected time:  Means of arrival:  Comments: EMS-low O 2 

## 2014-03-21 NOTE — ED Notes (Signed)
Attempted to call report x1. Unit secretary reports RN is in a room and charge RN is at a RR call and she is unsure if the patient is staying in the assigned bed. Sts she will have RN/Charge RN call back.

## 2014-03-21 NOTE — H&P (Signed)
[facility] History and Physical  [name] ZOX:[id number] DOB: 08-20-1950 DOA: 04/07/2014  Referring physician: PCP: Milinda Antis, MD   Chief Complaint: SOB  HPI: [name] is a 64 y.o. 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 For the last 2 days he has not been able to lay flat to sleep he can walk to the bathroom without getting short of breath and was not able to a flat to sleep. He denies any chest pain, fever, diarrhea or vomiting. He relates his leg has been swollen up.  In the ED: A brain natruretic peptide was done that was elevated, , chest x-ray showed bilateral pulmonary interstitial edema and we were consulted for further evaluation.  Review of Systems:  Constitutional:  No weight loss, night sweats, Fevers, chills, fatigue.  HEENT:  No headaches, Difficulty swallowing,Tooth/dental problems,Sore throat,  No sneezing, itching, ear ache, nasal congestion, post nasal drip,  Cardio-vascular:  No chest pain, Orthopnea, PND, swelling in lower extremities, anasarca, dizziness, palpitations  GI:  No heartburn, indigestion, abdominal pain, nausea, vomiting, diarrhea, change in bowel habits, loss of appetite  Resp:  No excess mucus, no productive cough, No non-productive cough, No coughing up of blood.No change in color of mucus.No wheezing.No chest wall deformity  Skin:  no rash or lesions.  GU:  no dysuria, change  in color of urine, no urgency or frequency. No flank pain.  Musculoskeletal:  No joint pain or swelling. No decreased range of motion. No back pain.  Psych:  No change in mood or affect. No depression or anxiety. No memory loss.   Past Medical History  Diagnosis Date  . Arteriosclerotic cardiovascular disease (ASCVD)     CABG in 2003 for 3 VD; 07/2007: Stress nuclear-inferior infarction without ischemia; normal EF by echo in 06/2005  . Diabetes mellitus type II   . Hyperlipidemia   . Degenerative joint disease     Left knee  . Cerebrovascular disease 2003    Carotid ultrasound in 2010: Patent right CEA site, 60-79% left internal carotid artery  . Peripheral vascular disease 2007    aortobifemoral BPG-2007  . Anemia   . Peripheral neuropathy   . Hypertension   . Sick sinus syndrome 1996    permanent pacemaker-1996; generator change in 2010  . Chronic obstructive pulmonary disease   . Obesity   . Obstructive sleep apnea   . Benign prostatic hypertrophy   . Gastroesophageal reflux disease   . Tobacco abuse, in remission     50 pack years total consumption; Quit in 2007   Past Surgical History  Procedure Laterality Date  . Coronary artery bypass graft  2003    three-vessel disease  . Iliac artery stent      Percutaneous intervention-right external iliac; unclear whether stent was placed  . Pacemaker insertion  1996    [employer] Jude Accent DR dual-chamber device, serial number [id number]   . Carotid endarterectomy  2003    Right  . Pacemaker generator  change  2010     St. Jude Accent DR dual-chamber device, serial number [id number]   . Aorta - bilateral femoral artery bypass graft  2007  . Femoral-popliteal bypass graft  1998    bilateral  . Colonoscopy  2012    Negative screening study.  . Ileocolonoscopy  02/20/2007    [facility]:QIONGE rectum, colon, and terminal ileum  . Femoral-femoral bypass graft Bilateral 01/03/2014    Procedure: BYPASS GRAFT RIGHT FEMORAL-LEFT FEMORAL ARTERY ;   Surgeon: Chuck Hint, MD;  Location: [facility] OR;  Service: Vascular;  Laterality: Bilateral;  . Endarterectomy femoral Left 01/03/2014    Procedure: ENDARTERECTOMY FEMORAL;  Surgeon: Chuck Hint, MD;  Location: [facility] OR;  Service: Vascular;  Laterality: Left;  . Intraoperative arteriogram Left 01/03/2014    Procedure: INTRA OPERATIVE ARTERIOGRAM, LEFT LOWER LEG;  Surgeon: Chuck Hint, MD;  Location: [facility] OR;  Service: Vascular;  Laterality: Left;  . Abdominal angiogram N/A 01/02/2014    Procedure: ABDOMINAL ANGIOGRAM;  Surgeon: Fransisco Hertz, MD;  Location: [facility] CATH LAB;  Service: Cardiovascular;  Laterality: N/A;  . Peripheral vascular catheterization  01/02/2014    Procedure: LOWER EXTREMITY ANGIOGRAPHY;  Surgeon: Fransisco Hertz, MD;  Location: [facility] CATH LAB;  Service: Cardiovascular;;  . Arch aortogram  01/02/2014    Procedure: ARCH AORTOGRAM;  Surgeon: Fransisco Hertz, MD;  Location: [facility] CATH LAB;  Service: Cardiovascular;;  . Wound exploration Right 02/15/2014    Procedure: Incision and Drainage Right Groin;  Surgeon: Sherren Kerns, MD;  Location: [facility] OR;  Service: Vascular;  Laterality: Right;  . Left heart catheterization with coronary/graft angiogram N/A 02/20/2014    Procedure: LEFT HEART CATHETERIZATION WITH Isabel Caprice;  Surgeon: Micheline Chapman, MD;  Location: [facility] CATH LAB;  Service: Cardiovascular;  Laterality: N/A;  . Tram Right 02/21/2014    Procedure: RIGHT GRACILIS MUSCLE FLAP TO RIGHT GROIN ;  Surgeon: Glenna Fellows, MD;  Location: [facility] OR;  Service: Plastics;  Laterality: Right;   Social History:  reports that he quit smoking about 9 years ago. His smoking use included Cigarettes. He has a 50 pack-year smoking history. He quit smokeless tobacco use about 18 years ago. He reports that he does not drink alcohol or use illicit drugs.  Allergies  Allergen Reactions  . Morphine And Related     Pt felt flushed and was diaphoretic     Family History  Problem  Relation Age of Onset  . Dementia Mother   . Heart attack Father   . Coronary artery disease Father   . Peripheral vascular disease Sister   . Diabetes Brother   . Peripheral vascular disease Brother   . Hypertension Brother   . Coronary artery disease Brother      Prior to Admission medications   Medication Sig Start Date End Date Taking? Authorizing Provider  acetaminophen (TYLENOL) 500 MG tablet Take 2 tablets (1,000 mg total) by mouth every 6 (six) hours as needed. Pain. 05/08/13  Yes Salley Scarlet, MD  albuterol (ACCUNEB) 0.63 MG/3ML nebulizer solution Take 3 mLs (0.63 mg total) by nebulization every 4 (four) hours as needed for wheezing. 03/16/14  Yes Osvaldo Shipper, MD  albuterol (PROVENTIL HFA;VENTOLIN HFA) 108 (90 BASE) MCG/ACT inhaler Inhale 2 puffs into the lungs every 4 (four) hours as needed. Patient taking differently: Inhale 2 puffs into the lungs every 4 (four) hours as needed for shortness of breath.  06/21/13  Yes Salley Scarlet, MD  Amino Acids-Protein Hydrolys (  FEEDING SUPPLEMENT, PRO-STAT SUGAR FREE 64,) LIQD Take 30 mLs by mouth daily.   Yes Historical Provider, MD  aspirin 81 MG tablet Take 1 tablet (81 mg total) by mouth daily. 05/08/13  Yes Salley ScarletKawanta F Harris, MD  atorvastatin (LIPITOR) 80 MG tablet Take 1 tablet (80 mg total) by mouth daily at 6 PM. 03/03/14  Yes Shanker Levora DredgeM Ghimire, MD  calcium carbonate (TUMS - DOSED IN MG ELEMENTAL CALCIUM) 500 MG chewable tablet Chew 1 tablet (200 mg of elemental calcium total) by mouth 2 (two) times daily as needed for indigestion or heartburn. 03/03/14  Yes Shanker Levora DredgeM Ghimire, MD  cloNIDine (CATAPRES - DOSED IN MG/24 HR) 0.1 mg/24hr patch Place 0.1 mg onto the skin once a week.   Yes Historical Provider, MD  ferrous sulfate 325 (65 FE) MG tablet Take 325 mg by mouth 2 (two) times daily. 02/01/14  Yes Historical Provider, MD  furosemide (LASIX) 40 MG tablet Take 120mg  twice a day for 5 days, then take 80mg  twice daily. 03/16/14  Yes  Osvaldo ShipperGokul Krishnan, MD  hydrALAZINE (APRESOLINE) 50 MG tablet Take 2 tablets (100 mg total) by mouth every 8 (eight) hours. 03/16/14  Yes Osvaldo ShipperGokul Krishnan, MD  insulin aspart (NOVOLOG) 100 UNIT/ML injection insulin aspart (novoLOG) injection 0-20 Units 0-20 Units, Subcutaneous, 3 times daily with meals CBG < 70: implement hypoglycemia protocol CBG 70 - 120: 0 units CBG 121 - 150: 3 units CBG 151 - 200: 4 units CBG 201 - 250: 7 units CBG 251 - 300: 11 units CBG 301 - 350: 15 units CBG 351 - 400: 20 units CBG > 400: call MD 03/16/14  Yes Osvaldo ShipperGokul Krishnan, MD  Insulin Detemir (LEVEMIR FLEXTOUCH) 100 UNIT/ML Pen Inject 16 Units into the skin daily at 10 pm. 03/16/14  Yes Osvaldo ShipperGokul Krishnan, MD  isosorbide mononitrate (IMDUR) 30 MG 24 hr tablet Take 1 tablet (30 mg total) by mouth daily. 03/03/14  Yes Shanker Levora DredgeM Ghimire, MD  metoprolol tartrate (LOPRESSOR) 25 MG tablet Take 2 tablets (50 mg total) by mouth 2 (two) times daily. 03/03/14  Yes Shanker Levora DredgeM Ghimire, MD  ondansetron (ZOFRAN) 4 MG tablet Take 1 tablet (4 mg total) by mouth every 6 (six) hours as needed for nausea. 03/03/14  Yes Shanker Levora DredgeM Ghimire, MD  polyethylene glycol (MIRALAX / GLYCOLAX) packet Take 17 g by mouth daily. 03/03/14  Yes Shanker Levora DredgeM Ghimire, MD  rifampin (RIFADIN) 300 MG capsule Take 1 capsule (300 mg total) by mouth every 12 (twelve) hours. 6 weeks from 02/28/14 03/03/14  Yes Shanker Levora DredgeM Ghimire, MD  traMADol (ULTRAM) 50 MG tablet Take 1 tablet (50 mg total) by mouth every 12 (twelve) hours as needed for moderate pain. 03/16/14  Yes Osvaldo ShipperGokul Krishnan, MD  tuberculin 5 UNIT/0.1ML injection Inject 0.1 mLs into the skin once.   Yes Historical Provider, MD  vitamin C (ASCORBIC ACID) 500 MG tablet Take 500 mg by mouth 2 (two) times daily.   Yes Historical Provider, MD  vitamin D, CHOLECALCIFEROL, 400 UNITS tablet Take 400 Units by mouth 2 (two) times daily.   Yes Historical Provider, MD  DAPTOmycin 600 mg in sodium chloride 0.9 % 100 mL Inject 600 mg into  the vein every other day. For 6 weeks from 02/28/14. Please follow electrolytes, once creatinine better dosage may need to be changed. 03/16/14   Osvaldo ShipperGokul Krishnan, MD  levofloxacin (LEVAQUIN) 500 MG tablet Take 1 tablet (500 mg total) by mouth every other day. For 2 more doses. 03/16/14   Gokul  Rito Ehrlich, MD   Physical Exam: Filed Vitals:   2014-03-26 1435 2014-03-26 1436 03/26/14 1529 March 26, 2014 1634  BP: 150/74  181/67 115/56  Pulse: 70  77 75  Temp: 99.5 F (37.5 C)   99.7 F (37.6 C)  TempSrc: Oral   Rectal  Resp: 18  26 26   SpO2: 88% 97% 97% 98%    Wt Readings from Last 3 Encounters:  03/16/14 94.53 kg (208 lb 6.4 oz)  03/04/14 98.113 kg (216 lb 4.8 oz)  02/10/14 89.812 kg (198 lb)    General:  Appears calm and comfortable Eyes: PERRL, normal lids, irises & conjunctiva ENT: grossly normal hearing, lips & tongue Neck: no LAD, masses or thyromegalyPositive JVD  Cardiovascular: RRR, no m/r/g.Edema in his sacrum his scrotum is edematous  Telemetry: SR, no arrhythmias  Respiratory: CTA bilaterally, no w/r/r. Normal respiratory effort. Abdomen: soft, ntnd Skin:Has some small drainage from the right leg, clear nonpurulent, has a PICC line on the left arm.  Musculoskeletal: grossly normal tone BUE/BLE Psychiatric: grossly normal mood and affect, speech fluent and appropriate Neurologic: grossly non-focal.          Labs on Admission:  Basic Metabolic Panel:  Recent Labs Lab 03/15/14 0525 03/16/14 0440 03/26/14 1509  NA 137 138 136  K 4.4 4.1 4.0  CL 102 101 100  CO2 29 26 28   GLUCOSE 135* 102* 230*  BUN 51* 56* 70*  CREATININE 2.91* 2.82* 2.92*  CALCIUM 8.5 8.6 8.4   Liver Function Tests: No results for input(s): AST, ALT, ALKPHOS, BILITOT, PROT, ALBUMIN in the last 168 hours. No results for input(s): LIPASE, AMYLASE in the last 168 hours. No results for input(s): AMMONIA in the last 168 hours. CBC:  Recent Labs Lab 03/15/14 0525 26-Mar-2014 1509  WBC 9.2 13.5*  HGB 7.5*  7.9*  HCT 24.5* 26.3*  MCV 86.0 83.8  PLT 300 391   Cardiac Enzymes:  Recent Labs Lab 26-Mar-2014 1542  TROPONINI 0.15*    BNP (last 3 results)  Recent Labs  02/17/14 1030 03/11/14 2225 2014-03-26 1610  BNP 1410.9* 2715.9* 2224.3*    ProBNP (last 3 results) No results for input(s): PROBNP in the last 8760 hours.  CBG:  Recent Labs Lab 03/15/14 1704 03/15/14 2237 03/16/14 0749 03/16/14 0828 03/16/14 1209  GLUCAP 149* 149* 68* 90 137*    Radiological Exams on Admission: Dg Chest 2 View (if Patient Has Fever And/or Copd)  03/26/2014   CLINICAL DATA:  Shortness of breath for 2 days, fever  EXAM: CHEST  2 VIEW  COMPARISON:  03/11/2014  FINDINGS: Dual lead right-sided pacer in place. Evidence of CABG noted. Left-sided PICC line in place with tip over the mid SVC. Hazy perihilar airspace opacities are noted in the setting of cardiomegaly, suggesting edema. Small pleural effusions are present.  IMPRESSION: Cardiomegaly with perihilar opacities suggesting edema, increased since previously.  Small pleural effusions.   Electronically Signed   By: Christiana Pellant M.D.   On: Mar 26, 2014 15:34    EKG: Independently reviewed. Atrial paced rhythm  Assessment/Plan Acute respiratory failure with hypoxia due to   Acute diastolic heart failure, NYHA class 3/minimal elevation in troponins: - I will go ahead and start him on Lasix infusion, measure strict I's and O's daily weights. - Restricted his fluid input and put him on a low-sodium diet. - Monitor his electrolytes closely. - His mild elevation of his troponins and  EKG does not show any sort ST segment abnormalities. The elevation of his troponins  in the setting of chronic renal disease without any chest pain makes a acute coronary syndrome very low likelihood.    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   AKI on chronic kidney see stage  IV (acute kidney injury) - This most likely due to acute decompensated diastolic heart failure. I am concern about cardiorenal syndrome. - Start him on IV diuresis and recheck a basic metabolic panel in the morning.  Leukocytosis: - Unclear etiology, has remained afebrile, he is on daptomycin, rifampin and he is also on Levaquin. - He has not spiked a fever here in the emergency room, I do not see source of infection, he does have an opening on the right leg which is draining serous fluid, nonpurulent non-malodorous, not erythematous. And his clinical picture does not appear to be pneumonia.  Essential hypertension - His blood pressure is significantly elevated we'll continue his beta blocker, add BiDil DC hydralazine, continue IV Lasix. - I will DC his clonidine as his offers him no cardiac protection. - We'll need to titrate heart failure medications as tolerated.  Right groin  femoral graft infection: - Continue IV daptomycin and oral rifampin for 6 weeks the starting date was 02/28/2014.  Normocytic anemia: - His hemoglobin has remained around 7-8 last several admissions. I will continue to monitor it.  Code Status: full (must indicate code status--if unknown or must be presumed, indicate so) DVT Prophylaxis:heparin Family Communication: none (indicate person spoken with, if applicable, with phone number if by telephone) Disposition Plan: inpatient  Time spent: 85 min.  Marinda Elk [facility] Pager [phone]

## 2014-03-21 NOTE — ED Notes (Signed)
Pt returned from chest xray.

## 2014-03-21 NOTE — ED Provider Notes (Addendum)
Face-to-face evaluation   History: Sent here for evaluation of fever and SOB. O2 sat. Low on RA. Not on O2 at home. Poor historian   Physical exam: Alert, calm and cooperative. Lungs with rales 1/2 way up. 3+ leg edema  Assessment: Dyspnea consistent with congestive heart failure, with secondary myocardial strain indicated by elevation of troponin, which is mild.  He has had elevated troponins in the past associated with his chronic renal disease.  He has ongoing anemia and complicated vascular status.  He is hypoxic on room air.  He is not currently on oxygen at his living setting.   CRITICAL CARE Performed by: Flint MelterWENTZ,Shem Plemmons L Total critical care time: 35 minutes Critical care time was exclusive of separately billable procedures and treating other patients. Critical care was necessary to treat or prevent imminent or life-threatening deterioration. Critical care was time spent personally by me on the following activities: development of treatment plan with patient and/or surrogate as well as nursing, discussions with consultants, evaluation of patient's response to treatment, examination of patient, obtaining history from patient or surrogate, ordering and performing treatments and interventions, ordering and review of laboratory studies, ordering and review of radiographic studies, pulse oximetry and re-evaluation of patient's condition.   Dg Chest 2 View (if Patient Has Fever And/or Copd)  04/13/2014   CLINICAL DATA:  Shortness of breath for 2 days, fever  EXAM: CHEST  2 VIEW  COMPARISON:  03/11/2014  FINDINGS: Dual lead right-sided pacer in place. Evidence of CABG noted. Left-sided PICC line in place with tip over the mid SVC. Hazy perihilar airspace opacities are noted in the setting of cardiomegaly, suggesting edema. Small pleural effusions are present.  IMPRESSION: Cardiomegaly with perihilar opacities suggesting edema, increased since previously.  Small pleural effusions.    Electronically Signed   By: Christiana PellantGretchen  Green M.D.   On: 04/04/2014 15:34   Results for orders placed or performed during the hospital encounter of 04/04/2014  MRSA PCR Screening  Result Value Ref Range   MRSA by PCR NEGATIVE NEGATIVE  CBC  Result Value Ref Range   WBC 13.5 (H) 4.0 - 10.5 K/uL   RBC 3.14 (L) 4.22 - 5.81 MIL/uL   Hemoglobin 7.9 (L) 13.0 - 17.0 g/dL   HCT 16.126.3 (L) 09.639.0 - 04.552.0 %   MCV 83.8 78.0 - 100.0 fL   MCH 25.2 (L) 26.0 - 34.0 pg   MCHC 30.0 30.0 - 36.0 g/dL   RDW 40.916.8 (H) 81.111.5 - 91.415.5 %   Platelets 391 150 - 400 K/uL  Basic metabolic panel  Result Value Ref Range   Sodium 136 135 - 145 mmol/L   Potassium 4.0 3.5 - 5.1 mmol/L   Chloride 100 96 - 112 mmol/L   CO2 28 19 - 32 mmol/L   Glucose, Bld 230 (H) 70 - 99 mg/dL   BUN 70 (H) 6 - 23 mg/dL   Creatinine, Ser 7.822.92 (H) 0.50 - 1.35 mg/dL   Calcium 8.4 8.4 - 95.610.5 mg/dL   GFR calc non Af Amer 21 (L) >90 mL/min   GFR calc Af Amer 25 (L) >90 mL/min   Anion gap 8 5 - 15  Troponin I  Result Value Ref Range   Troponin I 0.15 (H) <0.031 ng/mL  Brain natriuretic peptide  Result Value Ref Range   B Natriuretic Peptide 2224.3 (H) 0.0 - 100.0 pg/mL  Urinalysis, Routine w reflex microscopic  Result Value Ref Range   Color, Urine AMBER (A) YELLOW   APPearance CLOUDY (A)  CLEAR   Specific Gravity, Urine 1.016 1.005 - 1.030   pH 5.0 5.0 - 8.0   Glucose, UA NEGATIVE NEGATIVE mg/dL   Hgb urine dipstick NEGATIVE NEGATIVE   Bilirubin Urine NEGATIVE NEGATIVE   Ketones, ur NEGATIVE NEGATIVE mg/dL   Protein, ur 782 (A) NEGATIVE mg/dL   Urobilinogen, UA 1.0 0.0 - 1.0 mg/dL   Nitrite NEGATIVE NEGATIVE   Leukocytes, UA TRACE (A) NEGATIVE  Urine microscopic-add on  Result Value Ref Range   WBC, UA 3-6 <3 WBC/hpf   RBC / HPF 3-6 <3 RBC/hpf   Bacteria, UA FEW (A) RARE   Casts GRANULAR CAST (A) NEGATIVE   Urine-Other AMORPHOUS URATES/PHOSPHATES   Basic metabolic panel  Result Value Ref Range   Sodium 138 135 - 145 mmol/L    Potassium 4.0 3.5 - 5.1 mmol/L   Chloride 104 96 - 112 mmol/L   CO2 28 19 - 32 mmol/L   Glucose, Bld 206 (H) 70 - 99 mg/dL   BUN 69 (H) 6 - 23 mg/dL   Creatinine, Ser 9.56 (H) 0.50 - 1.35 mg/dL   Calcium 8.1 (L) 8.4 - 10.5 mg/dL   GFR calc non Af Amer 22 (L) >90 mL/min   GFR calc Af Amer 26 (L) >90 mL/min   Anion gap 6 5 - 15  CBC  Result Value Ref Range   WBC 10.8 (H) 4.0 - 10.5 K/uL   RBC 2.93 (L) 4.22 - 5.81 MIL/uL   Hemoglobin 7.4 (L) 13.0 - 17.0 g/dL   HCT 21.3 (L) 08.6 - 57.8 %   MCV 83.6 78.0 - 100.0 fL   MCH 25.3 (L) 26.0 - 34.0 pg   MCHC 30.2 30.0 - 36.0 g/dL   RDW 46.9 (H) 62.9 - 52.8 %   Platelets 344 150 - 400 K/uL  CK  Result Value Ref Range   Total CK 90 7 - 232 U/L  Glucose, capillary  Result Value Ref Range   Glucose-Capillary 214 (H) 70 - 99 mg/dL  Glucose, capillary  Result Value Ref Range   Glucose-Capillary 246 (H) 70 - 99 mg/dL  Glucose, capillary  Result Value Ref Range   Glucose-Capillary 200 (H) 70 - 99 mg/dL  Lactic acid, plasma  Result Value Ref Range   Lactic Acid, Venous 0.8 0.5 - 2.0 mmol/L  I-Stat Troponin, ED (not at [facility])  Result Value Ref Range   Troponin i, poc 0.09 (HH) 0.00 - 0.08 ng/mL   Comment NOTIFIED PHYSICIAN    Comment 3          I-Stat CG4 Lactic Acid, ED  Result Value Ref Range   Lactic Acid, Venous 0.62 0.5 - 2.0 mmol/L    Medical screening examination/treatment/procedure(s) were conducted as a shared visit with non-physician practitioner(s) and myself.  I personally evaluated the patient during the encounter  Flint Melter, MD 03/22/14 1304  Flint Melter, MD 03/22/14 [phone]

## 2014-03-21 NOTE — ED Provider Notes (Signed)
CSN: [id number]     Arrival date & time 04/10/2014  1421 History   First MD Initiated Contact with Patient 03/17/2014 1517     Chief Complaint  Patient presents with  . Shortness of Breath  . Fever     (Consider location/radiation/quality/duration/timing/severity/associated sxs/prior Treatment) HPI Comments: Patient is a 64 year old male with an extensive past medical history including diabetes, hypertension, SSS with St. Jude pacemaker in place, CAD with CABG in 2003, OSA, and CKD who presents with SOB for the past 2 days. Symptoms started gradually and progressively worsened since the onset. Patient sent to the ED via EMS from a nursing home in [place]. Staff reports patient appearing "lethargic" today. When they checked his vitals, he had a temp of 101.36F. Patient was given 1g tylenol. Oxygen was 86% on room air at the time. Patient is not oxygen dependent at baseline. Patient denies chest pain or any other associated symptoms. No aggravating/alleviating factors. Patient has recent hospital admissions for left limb ischemia requiring aortofemoral thrombectomy and femoral-femoral bypass graft by Dr. August Albino on 01/03/2014.   Cardiologist: Dr. Sharrell Ku   Past Medical History  Diagnosis Date  . Arteriosclerotic cardiovascular disease (ASCVD)     CABG in 2003 for 3 VD; 07/2007: Stress nuclear-inferior infarction without ischemia; normal EF by echo in 06/2005  . Diabetes mellitus type II   . Hyperlipidemia   . Degenerative joint disease     Left knee  . Cerebrovascular disease 2003    Carotid ultrasound in 2010: Patent right CEA site, 60-79% left internal carotid artery  . Peripheral vascular disease 2007    aortobifemoral BPG-2007  . Anemia   . Peripheral neuropathy   . Hypertension   . Sick sinus syndrome 1996    permanent pacemaker-1996; generator change in 2010  . Chronic obstructive pulmonary disease   . Obesity   . Obstructive sleep apnea   . Benign prostatic  hypertrophy   . Gastroesophageal reflux disease   . Tobacco abuse, in remission     50 pack years total consumption; Quit in 2007   Past Surgical History  Procedure Laterality Date  . Coronary artery bypass graft  2003    three-vessel disease  . Iliac artery stent      Percutaneous intervention-right external iliac; unclear whether stent was placed  . Pacemaker insertion  1996    St. Jude Accent DR dual-chamber device, serial number [id number]   . Carotid endarterectomy  2003    Right  . Pacemaker generator change  2010     St. Jude Accent DR dual-chamber device, serial number [id number]   . Aorta - bilateral femoral artery bypass graft  2007  . Femoral-popliteal bypass graft  1998    bilateral  . Colonoscopy  2012    Negative screening study.  . Ileocolonoscopy  02/20/2007    WUJ:WJXBJY rectum, colon, and terminal ileum  . Femoral-femoral bypass graft Bilateral 01/03/2014    Procedure: BYPASS GRAFT RIGHT FEMORAL-LEFT FEMORAL ARTERY ;  Surgeon: Chuck Hint, MD;  Location: [facility] OR;  Service: Vascular;  Laterality: Bilateral;  . Endarterectomy femoral Left 01/03/2014    Procedure: ENDARTERECTOMY FEMORAL;  Surgeon: Chuck Hint, MD;  Location: [facility] OR;  Service: Vascular;  Laterality: Left;  . Intraoperative arteriogram Left 01/03/2014    Procedure: INTRA OPERATIVE ARTERIOGRAM, LEFT LOWER LEG;  Surgeon: Chuck Hint, MD;  Location: [facility] OR;  Service: Vascular;  Laterality: Left;  . Abdominal angiogram N/A 01/02/2014  Procedure: ABDOMINAL ANGIOGRAM;  Surgeon: Fransisco Hertz, MD;  Location: [facility] CATH LAB;  Service: Cardiovascular;  Laterality: N/A;  . Peripheral vascular catheterization  01/02/2014    Procedure: LOWER EXTREMITY ANGIOGRAPHY;  Surgeon: Fransisco Hertz, MD;  Location: [facility] CATH LAB;  Service: Cardiovascular;;  . Arch aortogram  01/02/2014    Procedure: ARCH AORTOGRAM;  Surgeon: Fransisco Hertz, MD;  Location: [facility] CATH LAB;  Service: Cardiovascular;;  . Wound  exploration Right 02/15/2014    Procedure: Incision and Drainage Right Groin;  Surgeon: Sherren Kerns, MD;  Location: [facility] OR;  Service: Vascular;  Laterality: Right;  . Left heart catheterization with coronary/graft angiogram N/A 02/20/2014    Procedure: LEFT HEART CATHETERIZATION WITH Isabel Caprice;  Surgeon: Micheline Chapman, MD;  Location: [facility] CATH LAB;  Service: Cardiovascular;  Laterality: N/A;  . Tram Right 02/21/2014    Procedure: RIGHT GRACILIS MUSCLE FLAP TO RIGHT GROIN ;  Surgeon: Glenna Fellows, MD;  Location: [facility] OR;  Service: Plastics;  Laterality: Right;   Family History  Problem Relation Age of Onset  . Dementia Mother   . Heart attack Father   . Coronary artery disease Father   . Peripheral vascular disease Sister   . Diabetes Brother   . Peripheral vascular disease Brother   . Hypertension Brother   . Coronary artery disease Brother    History  Substance Use Topics  . Smoking status: Former Smoker -- 2.00 packs/day for 25 years    Types: Cigarettes    Quit date: 01/22/2005  . Smokeless tobacco: Former Neurosurgeon    Quit date: 02/16/1996  . Alcohol Use: No    Review of Systems  Constitutional: Positive for fever. Negative for chills and fatigue.  HENT: Negative for trouble swallowing.   Eyes: Negative for visual disturbance.  Respiratory: Positive for shortness of breath.   Cardiovascular: Negative for chest pain and palpitations.  Gastrointestinal: Negative for nausea, vomiting, abdominal pain and diarrhea.  Genitourinary: Negative for dysuria and difficulty urinating.  Musculoskeletal: Negative for arthralgias and neck pain.  Skin: Negative for color change.  Neurological: Negative for dizziness and weakness.  Psychiatric/Behavioral: Negative for dysphoric mood.      Allergies  Morphine and related  Home Medications   Prior to Admission medications   Medication Sig Start Date End Date Taking? Authorizing Provider  acetaminophen (TYLENOL) 500 MG  tablet Take 2 tablets (1,000 mg total) by mouth every 6 (six) hours as needed. Pain. 05/08/13   Salley Scarlet, MD  albuterol (ACCUNEB) 0.63 MG/3ML nebulizer solution Take 3 mLs (0.63 mg total) by nebulization every 4 (four) hours as needed for wheezing. 03/16/14   Osvaldo Shipper, MD  albuterol (PROVENTIL HFA;VENTOLIN HFA) 108 (90 BASE) MCG/ACT inhaler Inhale 2 puffs into the lungs every 4 (four) hours as needed. Patient taking differently: Inhale 2 puffs into the lungs every 4 (four) hours as needed for shortness of breath.  06/21/13   Salley Scarlet, MD  Amino Acids-Protein Hydrolys (FEEDING SUPPLEMENT, PRO-STAT SUGAR FREE 64,) LIQD Take 30 mLs by mouth daily.    Historical Provider, MD  aspirin 81 MG tablet Take 1 tablet (81 mg total) by mouth daily. 05/08/13   Salley Scarlet, MD  atorvastatin (LIPITOR) 80 MG tablet Take 1 tablet (80 mg total) by mouth daily at 6 PM. 03/03/14   Maretta Bees, MD  calcium carbonate (TUMS - DOSED IN MG ELEMENTAL CALCIUM) 500 MG chewable tablet Chew 1 tablet (200 mg of elemental calcium total)  by mouth 2 (two) times daily as needed for indigestion or heartburn. Patient taking differently: Chew 2 tablets by mouth 2 (two) times daily as needed for indigestion or heartburn.  03/03/14   Shanker Levora Dredge, MD  cloNIDine (CATAPRES - DOSED IN MG/24 HR) 0.1 mg/24hr patch Place 0.1 mg onto the skin once a week.    Historical Provider, MD  DAPTOmycin 600 mg in sodium chloride 0.9 % 100 mL Inject 600 mg into the vein every other day. For 6 weeks from 02/28/14. Please follow electrolytes, once creatinine better dosage may need to be changed. 03/16/14   Osvaldo Shipper, MD  ferrous sulfate 325 (65 FE) MG tablet Take 325 mg by mouth 2 (two) times daily. 02/01/14   Historical Provider, MD  furosemide (LASIX) 40 MG tablet Take  twice a day for 5 days, then take  twice daily. 03/16/14   Osvaldo Shipper, MD  hydrALAZINE (APRESOLINE) 50 MG tablet Take 2 tablets (100 mg total) by  mouth every 8 (eight) hours. 03/16/14   Osvaldo Shipper, MD  insulin aspart (NOVOLOG) 100 UNIT/ML injection insulin aspart (novoLOG) injection 0-20 Units 0-20 Units, Subcutaneous, 3 times daily with meals CBG < 70: implement hypoglycemia protocol CBG 70 - 120: 0 units CBG 121 - 150: 3 units CBG 151 - 200: 4 units CBG 201 - 250: 7 units CBG 251 - 300: 11 units CBG 301 - 350: 15 units CBG 351 - 400: 20 units CBG > 400: call MD 03/16/14   Osvaldo Shipper, MD  Insulin Detemir (LEVEMIR FLEXTOUCH) 100 UNIT/ML Pen Inject 16 Units into the skin daily at 10 pm. 03/16/14   Osvaldo Shipper, MD  isosorbide mononitrate (IMDUR) 30 MG 24 hr tablet Take 1 tablet (30 mg total) by mouth daily. 03/03/14   Shanker Levora Dredge, MD  levofloxacin (LEVAQUIN) 500 MG tablet Take 1 tablet (500 mg total) by mouth every other day. For 2 more doses. 03/16/14   Osvaldo Shipper, MD  metoprolol tartrate (LOPRESSOR) 25 MG tablet Take 2 tablets (50 mg total) by mouth 2 (two) times daily. 03/03/14   Shanker Levora Dredge, MD  ondansetron (ZOFRAN) 4 MG tablet Take 1 tablet (4 mg total) by mouth every 6 (six) hours as needed for nausea. 03/03/14   Shanker Levora Dredge, MD  polyethylene glycol (MIRALAX / GLYCOLAX) packet Take 17 g by mouth daily. 03/03/14   Shanker Levora Dredge, MD  rifampin (RIFADIN) 300 MG capsule Take 1 capsule (300 mg total) by mouth every 12 (twelve) hours. 6 weeks from 02/28/14 03/03/14   Maretta Bees, MD  traMADol (ULTRAM) 50 MG tablet Take 1 tablet (50 mg total) by mouth every 12 (twelve) hours as needed for moderate pain. 03/16/14   Osvaldo Shipper, MD  vitamin C (ASCORBIC ACID) 500 MG tablet Take 500 mg by mouth 2 (two) times daily.    Historical Provider, MD  vitamin D, CHOLECALCIFEROL, 400 UNITS tablet Take 400 Units by mouth 2 (two) times daily.    Historical Provider, MD   BP 150/74 mmHg  Pulse 70  Temp(Src) 99.5 F (37.5 C) (Oral)  Resp 18  SpO2 97% Physical Exam  Constitutional: He is oriented to person, place, and  time. He appears well-developed and well-nourished. No distress.  HENT:  Head: Normocephalic and atraumatic.  Eyes: Conjunctivae and EOM are normal.  Neck: Normal range of motion.  Cardiovascular: Normal rate and regular rhythm.  Exam reveals no gallop and no friction rub.   No murmur heard. Pulmonary/Chest: Breath sounds normal.  He has no wheezes. He has no rales. He exhibits no tenderness.  Bibasilar rales noted on lung auscultation. Increased breathing effort. Patient wearing 2L oxygen nasal cannula.  Abdominal: Soft. He exhibits no distension. There is no tenderness. There is no rebound and no guarding.  Musculoskeletal: Normal range of motion.  2+ lower extremity pitting edema. No calf tenderness to palpation.   Neurological: He is alert and oriented to person, place, and time. Coordination normal.  Speech is goal-oriented. Moves limbs without ataxia.   Skin: Skin is warm and dry.  Psychiatric: He has a normal mood and affect. His behavior is normal.  Nursing note and vitals reviewed.   ED Course  Procedures (including critical care time) Labs Review Labs Reviewed  CBC - Abnormal; Notable for the following:    WBC 13.5 (*)    RBC 3.14 (*)    Hemoglobin 7.9 (*)    HCT 26.3 (*)    MCH 25.2 (*)    RDW 16.8 (*)    All other components within normal limits  BASIC METABOLIC PANEL - Abnormal; Notable for the following:    Glucose, Bld 230 (*)    BUN 70 (*)    Creatinine, Ser 2.92 (*)    GFR calc non Af Amer 21 (*)    GFR calc Af Amer 25 (*)    All other components within normal limits  TROPONIN I - Abnormal; Notable for the following:    Troponin I 0.15 (*)    All other components within normal limits  BRAIN NATRIURETIC PEPTIDE - Abnormal; Notable for the following:    B Natriuretic Peptide 2224.3 (*)    All other components within normal limits  URINALYSIS, ROUTINE W REFLEX MICROSCOPIC - Abnormal; Notable for the following:    Color, Urine AMBER (*)    APPearance CLOUDY  (*)    Protein, ur 100 (*)    Leukocytes, UA TRACE (*)    All other components within normal limits  URINE MICROSCOPIC-ADD ON - Abnormal; Notable for the following:    Bacteria, UA FEW (*)    Casts GRANULAR CAST (*)    All other components within normal limits  I-STAT TROPOININ, ED - Abnormal; Notable for the following:    Troponin i, poc 0.09 (*)    All other components within normal limits  I-STAT CG4 LACTIC ACID, ED    Imaging Review Dg Chest 2 View (if Patient Has Fever And/or Copd)  03/18/2014   CLINICAL DATA:  Shortness of breath for 2 days, fever  EXAM: CHEST  2 VIEW  COMPARISON:  03/11/2014  FINDINGS: Dual lead right-sided pacer in place. Evidence of CABG noted. Left-sided PICC line in place with tip over the mid SVC. Hazy perihilar airspace opacities are noted in the setting of cardiomegaly, suggesting edema. Small pleural effusions are present.  IMPRESSION: Cardiomegaly with perihilar opacities suggesting edema, increased since previously.  Small pleural effusions.   Electronically Signed   By: Christiana PellantGretchen  Green M.D.   On: Sep 28, 2014 15:34    Date: Sep 28, 2014  Rate: 77  Rhythm: indeterminate  QRS Axis: normal  Intervals: QT prolonged  ST/T Wave abnormalities: indeterminate  Conduction Disutrbances:none  Narrative Interpretation: atrial paced rhythm without acute changes  Old EKG Reviewed: unchanged     EKG Interpretation None      MDM   Final diagnoses:  SOB (shortness of breath)  Dependence on supplemental oxygen    3:24 PM  Patient has a low grade temp of 99.5 and currently  oxygen dependent on 2L nasal cannula. Patient 86% on room air at rest. Labs and chest xray pending.    6:58 PM Patient admitted for CHF exacerbation and oxygen dependence.   Emilia Beck, PA-C 03-25-2014 1901  Emilia Beck, PA-C 03/25/14 1928  Flint Melter, MD 03/22/14 1304

## 2014-03-21 NOTE — ED Notes (Addendum)
Per family pt began shaking and desat to 69% with NS. Family asked if pt has hx of seizures and stated pt does not. MD paged and attempted to call ICU RN.

## 2014-03-21 NOTE — ED Notes (Signed)
[place] Staff noted lethargy today. VS at 1030, 101.66F, gave 1000mg  tylenol, O2 sat 86%, placed on O2 2L. At 1300, temp 100F, O2 sat still low, sent for eval. Pt on 4L  O2 with ems, stayed 98% en route. Pt reports sob x2 days. Denies any pain.

## 2014-03-21 NOTE — Progress Notes (Signed)
Utilization Review completed.  Yoana Staib RN CM  

## 2014-03-22 LAB — BASIC METABOLIC PANEL
ANION GAP: 6 (ref 5–15)
BUN: 69 mg/dL — ABNORMAL HIGH (ref 6–23)
CO2: 28 mmol/L (ref 19–32)
Calcium: 8.1 mg/dL — ABNORMAL LOW (ref 8.4–10.5)
Chloride: 104 mmol/L (ref 96–112)
Creatinine, Ser: 2.82 mg/dL — ABNORMAL HIGH (ref 0.50–1.35)
GFR calc Af Amer: 26 mL/min — ABNORMAL LOW (ref >90)
GFR calc non Af Amer: 22 mL/min — ABNORMAL LOW (ref >90)
Glucose, Bld: 206 mg/dL — ABNORMAL HIGH (ref 70–99)
Potassium: 4 mmol/L (ref 3.5–5.1)
Sodium: 138 mmol/L (ref 135–145)

## 2014-03-22 LAB — GLUCOSE, CAPILLARY
GLUCOSE-CAPILLARY: 246 mg/dL — AB (ref 70–99)
Glucose-Capillary: 214 mg/dL — ABNORMAL HIGH (ref 70–99)
Glucose-Capillary: 200 mg/dL — ABNORMAL HIGH (ref 70–99)
Glucose-Capillary: 148 mg/dL — ABNORMAL HIGH (ref 70–99)

## 2014-03-22 LAB — CBC
HEMATOCRIT: 24.5 % — AB (ref 39.0–52.0)
HEMOGLOBIN: 7.4 g/dL — AB (ref 13.0–17.0)
MCH: 25.3 pg — ABNORMAL LOW (ref 26.0–34.0)
MCHC: 30.2 g/dL (ref 30.0–36.0)
MCV: 83.6 fL (ref 78.0–100.0)
Platelets: 344 10*3/uL (ref 150–400)
RBC: 2.93 MIL/uL — ABNORMAL LOW (ref 4.22–5.81)
RDW: 16.4 % — ABNORMAL HIGH (ref 11.5–15.5)
WBC: 10.8 10*3/uL — ABNORMAL HIGH (ref 4.0–10.5)

## 2014-03-22 LAB — CK: CK TOTAL: 90 U/L (ref 7–232)

## 2014-03-22 LAB — LACTIC ACID, PLASMA
LACTIC ACID, VENOUS: 0.8 mmol/L (ref 0.5–2.0)
Lactic Acid, Venous: 0.6 mmol/L (ref 0.5–2.0)

## 2014-03-22 LAB — PROCALCITONIN: Procalcitonin: 1.69 ng/mL

## 2014-03-22 MED ORDER — FUROSEMIDE 10 MG/ML IJ SOLN
40.0000 mg | Freq: Two times a day (BID) | INTRAMUSCULAR | Status: DC
  Administered 2014-03-22 – 2014-03-26 (×8): 40 mg via INTRAVENOUS
  Filled 2014-03-22 (×9): qty 4

## 2014-03-22 MED ORDER — INSULIN DETEMIR 100 UNIT/ML ~~LOC~~ SOLN
20.0000 [IU] | Freq: Every day | SUBCUTANEOUS | Status: DC
  Administered 2014-03-22 – 2014-03-30 (×8): 20 [IU] via SUBCUTANEOUS
  Filled 2014-03-22 (×11): qty 0.2

## 2014-03-22 MED ORDER — CETYLPYRIDINIUM CHLORIDE 0.05 % MT LIQD
7.0000 mL | Freq: Two times a day (BID) | OROMUCOSAL | Status: DC
  Administered 2014-03-22 – 2014-03-30 (×15): 7 mL via OROMUCOSAL

## 2014-03-22 MED ORDER — SALINE SPRAY 0.65 % NA SOLN
1.0000 | NASAL | Status: DC | PRN
  Administered 2014-03-22: 1 via NASAL
  Filled 2014-03-22: qty 44

## 2014-03-22 NOTE — Progress Notes (Signed)
Patient ID: [name], male   DOB: [birth year], 64 y.o.   MRN: [id number]  [facility] PROGRESS NOTE  NAVDEEP HALT MWU:[id number] DOB: 1950/06/22 DOA: 04/11/2014 PCP: Vic Blackbird, MD   Brief narrative:    64 y.o. male 65 year old male with past medical history of diastolic heart failure, thrombectomy of the left limb of the aorto femoral bypass graft, extensive endarterectomy of the left common femoral artery, Dacron patch angioplasty of the right limb, right groin infection and graft infection status post I and D of abscess on 02/15/2014 which grew MRSA, on daptomycin and rifampin, uncontrolled diabetes type 2 with last hemoglobin A1c of 7.6, chronic renal disease with baseline creatinine of 1.7-2.0 that comes in for shortness of breath that has progressively gotten worse for the last 3 weeks. For the last 2 days he has not been able to lay flat to sleep and can not walk to the bathroom without getting short of breath. He denies any chest pain, fever, diarrhea or vomiting. He relates his leg has been swollen up.  In the ED: Elevated BNP, CXR with bilateral pulmonary interstitial edema and [facility] asked to admit for further management, SDU requested.   Assessment/Plan:    Acute respiratory failure with hypoxia due to Acute diastolic heart failure, NYHA class 3/minimal elevation in troponins: - pt started on Lasix infusion in ED and was continued here in SDU, weight is trending down from 207 lbs --> 200 lbs - will stop Lasix infusion and will place on Lasix 40 mg IV BID for now - continue to monitor daily weights, strict I's and O's, monitor renal function as well  - not sure who lungs sounds as pt did not want me to examine him this AM, will try later  - Restrict fluid intake and allow low-sodium diet. - Has mild elevation in troponins and EKG does not show any sort ST segment abnormalities. - The elevation of troponins in the setting of chronic renal disease without any chest  pain makes ACS very low likelihood - no need for repeat 2 D ECHO as the last one done 01/2014 with RF 72% and diastolic heart failure  - in addition, no need for cycling troponins unless pt reports chest pain    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, CKD - started on Levemir with SSI for additional coverage - will increase Levemir from 16 U to 20 U QHS for better diabetic control   Sepsis  - criteria met for sepsis on admission with T 100F, RR 24 - 29 bpm, oxygen sat 86% on RA, WBC 13.5, source unclear at this time - CXR with no sign of PNA, UA with small leukocytes so possible source, but more worrisome given recent right groin and graft infection and s/p I&D of abscess  02/15/2014 which grew MRSA, pt started on daptomycin and rifampin at that time 02/28/2014 and to continue for 6 weeks from that date, again I wanted to examine pt but he asked me to leave and will let me known when he is ready  - pt afebrile this AM and WBC is trending down  - will continue Daptomycin, Rifampin - will repeat blood culture, also check urine culture, procalcitonin level per ICU protocol  - repeat CBC in AM, consider ID consult if persistent fevers  - wound care consult   Acute encephalopathy - confusion this AM secondary to the above illness, sepsis, acute CHD diastolic - keep monitoring in SDU   AKI  on chronic kidney disease stage IV (acute kidney injury) - This most likely due to acute decompensated diastolic heart failure with concern of ? cardiorenal syndrome. - started on Lasix infusion but will change to Lasix 40 mg BID IV - Cr trending down: 2.92 --> 2.82  Essential hypertension - BP stable this AM, continue  - continue Metoprolol 50 mg BID PO, Lasix 40 mg IV BID, isosorbide -hydralazine BID  Right groin femoral graft infection: - Continue IV daptomycin and oral rifampin for 6 weeks the starting date was 02/28/2014. 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  Family Communication:  plan of care discussed with the patient Disposition Plan: Needs to stay in SDU until mental status improves   IV access:  Peripheral IV  Procedures and diagnostic studies:    Dg Chest 2 View 04/07/2014  Cardiomegaly with perihilar opacities suggesting edema, increased since previously.  Small pleural effusions.     Ct Head Wo Contrast  02/25/2014  No acute intracranial process. 2. Partially visualized right ethmoid sinus disease.      Dg Chest Port 1 View  02/27/2014  Congestive heart failure with pulmonary interstitial edema and small bilateral pleural effusions and persistent left lower lobe atelectasis. There has not been significant interval change since yesterday's study.     Medical Consultants:  None  Other Consultants:  None  IAnti-Infectives:   Daptomycin 1/15 --> Rifampin 1/15 --> Levaquin 2/5 -->  Faye Ramsay, MD  [facility] Pager [phone]  If 7PM-7AM, please contact night-coverage [email or web address] Password [email or web address] 03/22/2014, 10:52 AM   LOS: 1 day   HPI/Subjective: No events overnight. Pt confused this AM and not following commands, very rude.  Objective: Filed Vitals:   03/22/14 0512 03/22/14 0740 03/22/14 0800 03/22/14 1000  BP:  132/53 134/57 113/62  Pulse:  80 69 73  Temp: 100 F (37.8 C)     TempSrc:      Resp:  $Remo'24 17 20  'HSkCQ$ Height:      Weight: 91 kg (200 lb 9.9 oz)     SpO2:  99% 88% 99%    Intake/Output Summary (Last 24 hours) at 03/22/14 1052 Last data filed at 03/22/14 1000  Gross per 24 hour  Intake     521 ml  Output    890 ml  Net   -369 ml    Exam:   General:  Pt is alert, does not want to participate in exam, refusing to be examined says he is in too much pain   Neuro: Pt is alert and moving all 4 extremities but does not was to be examined, asked me to leave   Data Reviewed: Basic Metabolic Panel:  Recent Labs Lab 03/16/14 0440 03/29/2014 1509 03/22/14 0352  NA 138 136 138  K 4.1 4.0 4.0  CL 101 100 104  CO2 $Re'26 28 28  'XjF$ GLUCOSE 102* 230* 206*  BUN 56* 70* 69*  CREATININE 2.82* 2.92* 2.82*  CALCIUM 8.6 8.4 8.1*   CBC:  Recent Labs Lab 03/20/2014 1509 03/22/14 0352  WBC 13.5* 10.8*  HGB 7.9* 7.4*  HCT 26.3* 24.5*  MCV 83.8 83.6  PLT 391 344   Cardiac Enzymes:  Recent Labs Lab 03/24/2014 1542 03/22/14 0352  CKTOTAL  --  90  TROPONINI 0.15*  --    CBG:  Recent Labs Lab 03/16/14 0828 03/16/14 1209 04/12/2014 2136 03/28/2014 2320 03/22/14 0800  GLUCAP 90 137* 214* 246* 200*    Recent Results (from the past 240 hour(s))  MRSA PCR Screening     Status: Abnormal   Collection Time: 03/14/14 10:05 AM  Result Value Ref Range Status   MRSA by PCR POSITIVE (A) NEGATIVE Final  MRSA PCR Screening     Status: None   Collection Time: 03/24/2014  8:30 PM  Result Value Ref Range Status   MRSA by PCR NEGATIVE NEGATIVE Final     Scheduled Meds: . aspirin EC  81 mg Oral Daily  . atorvastatin  80 mg Oral q1800  . DAPTOmycin (CUBICIN)  IV  400 mg Intravenous Q48H  . DAPTOmycin (CUBICIN)  IV  600 mg Intravenous Q48H  . furosemide  40 mg Intravenous STAT  . insulin aspart  0-20 Units Subcutaneous TID WC  . insulin aspart  0-5 Units Subcutaneous QHS  . insulin detemir  16 Units Subcutaneous QHS  . isosorbide-hydrALAZINE  1 tablet Oral BID  . levofloxacin  500 mg Oral Q48H  . metoprolol tartrate  50 mg Oral BID  . polyethylene glycol  17 g Oral Daily  . rifampin  300 mg Oral Q12H   Continuous Infusions: . furosemide (LASIX) infusion 4 mg/hr (03/22/14 1000)

## 2014-03-23 LAB — CBC
HEMATOCRIT: 23.6 % — AB (ref 39.0–52.0)
Hemoglobin: 7.2 g/dL — ABNORMAL LOW (ref 13.0–17.0)
MCH: 25.4 pg — ABNORMAL LOW (ref 26.0–34.0)
MCHC: 30.5 g/dL (ref 30.0–36.0)
MCV: 83.1 fL (ref 78.0–100.0)
Platelets: 324 10*3/uL (ref 150–400)
RBC: 2.84 MIL/uL — AB (ref 4.22–5.81)
RDW: 16.3 % — ABNORMAL HIGH (ref 11.5–15.5)
WBC: 11 10*3/uL — ABNORMAL HIGH (ref 4.0–10.5)

## 2014-03-23 LAB — GLUCOSE, CAPILLARY
GLUCOSE-CAPILLARY: 115 mg/dL — AB (ref 70–99)
GLUCOSE-CAPILLARY: 180 mg/dL — AB (ref 70–99)
Glucose-Capillary: 147 mg/dL — ABNORMAL HIGH (ref 70–99)
Glucose-Capillary: 153 mg/dL — ABNORMAL HIGH (ref 70–99)
Glucose-Capillary: 188 mg/dL — ABNORMAL HIGH (ref 70–99)
Glucose-Capillary: 209 mg/dL — ABNORMAL HIGH (ref 70–99)

## 2014-03-23 LAB — BASIC METABOLIC PANEL
ANION GAP: 6 (ref 5–15)
BUN: 72 mg/dL — AB (ref 6–23)
CALCIUM: 8.2 mg/dL — AB (ref 8.4–10.5)
CO2: 29 mmol/L (ref 19–32)
Chloride: 102 mmol/L (ref 96–112)
Creatinine, Ser: 2.96 mg/dL — ABNORMAL HIGH (ref 0.50–1.35)
GFR calc Af Amer: 24 mL/min — ABNORMAL LOW (ref >90)
GFR calc non Af Amer: 21 mL/min — ABNORMAL LOW (ref >90)
Glucose, Bld: 140 mg/dL — ABNORMAL HIGH (ref 70–99)
POTASSIUM: 4.4 mmol/L (ref 3.5–5.1)
Sodium: 137 mmol/L (ref 135–145)

## 2014-03-23 LAB — PROCALCITONIN: Procalcitonin: 1.39 ng/mL

## 2014-03-23 LAB — ABO/RH: ABO/RH(D): B POS

## 2014-03-23 MED ORDER — SODIUM CHLORIDE 0.9 % IJ SOLN
10.0000 mL | INTRAMUSCULAR | Status: DC | PRN
  Administered 2014-03-23 – 2014-03-27 (×4): 10 mL
  Filled 2014-03-23 (×4): qty 40

## 2014-03-23 MED ORDER — LORAZEPAM 1 MG PO TABS
1.0000 mg | ORAL_TABLET | Freq: Once | ORAL | Status: AC
  Administered 2014-03-23: 1 mg via ORAL
  Filled 2014-03-23: qty 1

## 2014-03-23 MED ORDER — SODIUM CHLORIDE 0.9 % IV SOLN
Freq: Once | INTRAVENOUS | Status: AC
Start: 2014-03-23 — End: 2014-03-23
  Administered 2014-03-23: 12:00:00 via INTRAVENOUS

## 2014-03-23 MED ORDER — RESOURCE INSTANT PROTEIN PO PWD PACKET
1.0000 | Freq: Two times a day (BID) | ORAL | Status: DC
  Administered 2014-03-24 – 2014-03-30 (×14): 6 g via ORAL
  Filled 2014-03-23 (×19): qty 6

## 2014-03-23 NOTE — Progress Notes (Signed)
Patient ID: [name], male   DOB: 1950/11/30, 64 y.o.   MRN: [id number]  [facility] PROGRESS NOTE  EXZAVIER RUDERMAN NWG:[id number] DOB: 30-Jul-1950 DOA: 04/03/2014 PCP: Vic Blackbird, MD  Brief narrative:    64 y.o. 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 For the last 2 days he has not been able to lay flat to sleep and can not walk to the bathroom without getting short of breath. He denies any chest pain, fever, diarrhea or vomiting. He relates his leg has been swollen up.  In the ED: Elevated BNP, CXR with bilateral pulmonary interstitial edema and TRH asked to admit for further management, SDU requested.   Assessment/Plan:    Acute respiratory failure with hypoxia due to Acute diastolic heart failure, NYHA class 3/minimal elevation in troponins: - pt started on Lasix infusion in ED and was continued in SDU, weight is trending down from 207 lbs --> 200 lbs - stopped Lasix infusion 2/6 and transitioned to Lasix 40 mg IV BID  - continue to monitor daily weights, strict I's and O's, monitor renal function as well  - Restrict fluid intake and allow low-sodium diet. - Had mild elevation in troponins on admission but EKG did not show any ST segment abnormalities. 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, CKD - started on Levemir with SSI for additional coverage - increased Levemir to 20 U QHS for better diabetic control   Sepsis  - criteria met for sepsis on admission with T 100F, RR 24 - 29 bpm, oxygen sat 86% on RA, WBC 13.5, source unclear at this time - CXR with no sign of PNA, UA with small leukocytes so possible source, but more worrisome given recent right groin and graft infection and s/p I&D of abscess 02/15/2014 which grew MRSA, pt started on daptomycin and rifampin at that time 02/28/2014 and to continue for 6 weeks from that date - pt with low grade fever and WBC 11 this AM - will continue Daptomycin, Rifampin, not clear why on Levaquin, so will stop  - repeat blood culture pending  - repeat CBC in AM, consider ID consult if persistent fevers  - wound care consult   Acute encephalopathy - clearer mental status this AM but still somewhat confused   AKI on chronic kidney disease stage IV (acute kidney injury) - most likely due to acute decompensated diastolic heart failure with concern of ? cardiorenal syndrome. - started on Lasix infusion and changed to Lasix 40 mg BID IV - Cr trend: 2.92 --> 2.82 --> 2.96  Essential hypertension - BP  stable this AM, continue  - continue Metoprolol 50 mg BID PO, Lasix 40 mg IV BID, isosorbide -hydralazine BID  Right groin femoral graft infection: - Continue IV daptomycin and oral rifampin for 6 weeks the starting date was 02/28/2014. 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  Family Communication: plan of care discussed with the patient Disposition Plan: transfer to telemetry bed   IV access:  Peripheral IV  Procedures and diagnostic studies:   Dg Chest 2 View 03/19/2014 Cardiomegaly with perihilar opacities suggesting edema, increased since previously. Small pleural effusions.   Ct Head Wo Contrast 02/25/2014 No acute intracranial process. 2. Partially visualized right ethmoid sinus disease.   Dg Chest Port 1 View 02/27/2014 Congestive heart failure with pulmonary interstitial edema and small bilateral pleural effusions and persistent left lower lobe atelectasis. There has not been significant interval change since yesterday's study.   Medical Consultants:  None  Other Consultants:  None  IAnti-Infectives:   Daptomycin 1/15 --> Rifampin 1/15 --> Levaquin 2/5 --> 2/7   Faye Ramsay, MD  [facility] Pager [phone]  If 7PM-7AM, please contact night-coverage [email or web address] Password [facility] 03/23/2014, 10:41 AM   LOS: 2 days   HPI/Subjective: No events overnight.   Objective: Filed Vitals:   03/23/14 0200 03/23/14 0402 03/23/14 0800 03/23/14 0958  BP: 158/67 123/57 136/66 149/68  Pulse: 70 70 70 79  Temp:  97.6 F (36.4 C)    TempSrc:  Oral    Resp: $Remo'20 26 24   'SOJHC$ Height:      Weight:  91 kg (200 lb 9.9 oz)    SpO2: 98% 97% 90%     Intake/Output Summary (Last 24 hours) at 03/23/14 1041 Last data filed at 03/23/14 0800  Gross per 24 hour  Intake  729.8 ml  Output    675 ml  Net   54.8 ml    Exam:   General:  Pt is alert, follows commands appropriately, not in acute distress  Cardiovascular: Regular rate and rhythm,  no rubs, no gallops  Respiratory: Clear to auscultation bilaterally, no wheezing, crackles at bases   Abdomen: Soft, non tender, non  distended, bowel sounds present, no guarding  Extremities: +2 bilateral LE pitting edema, pulses DP and PT palpable bilaterally  Neuro: Grossly nonfocal  Data Reviewed: Basic Metabolic Panel:  Recent Labs Lab 03/22/2014 1509 03/22/14 0352 03/23/14 0440  NA 136 138 137  K 4.0 4.0 4.4  CL 100 104 102  CO2 $Re'28 28 29  'AHx$ GLUCOSE 230* 206* 140*  BUN 70* 69* 72*  CREATININE 2.92* 2.82* 2.96*  CALCIUM 8.4 8.1* 8.2*   CBC:  Recent Labs Lab 03/22/2014 1509 03/22/14 0352 03/23/14 0440  WBC 13.5* 10.8* 11.0*  HGB 7.9* 7.4* 7.2*  HCT 26.3* 24.5* 23.6*  MCV 83.8 83.6 83.1  PLT 391 344 324   Cardiac Enzymes:  Recent Labs Lab 03/27/2014 1542 03/22/14 0352  CKTOTAL  --  90  TROPONINI 0.15*  --    CBG:  Recent Labs Lab 04/08/2014 2320 03/22/14 0800 03/22/14 1332 03/22/14 1621 03/22/14 2113  GLUCAP 246* 200*  115* 148* 147*    Recent Results (from the past 240 hour(s))  MRSA PCR Screening     Status: Abnormal   Collection Time: 03/14/14 10:05 AM  Result Value Ref Range Status   MRSA by PCR POSITIVE (A) NEGATIVE Final    Comment:        The GeneXpert MRSA Assay (FDA approved for NASAL specimens only), is one component of a comprehensive MRSA colonization surveillance program. It is not intended to diagnose MRSA infection nor to guide or monitor treatment for MRSA infections. RESULT CALLED TO, READ BACK BY AND VERIFIED WITH: T. LUCAS RN 13:00 03/14/14 ([email or web address])   MRSA PCR Screening     Status: None   Collection Time: 03/19/2014  8:30 PM  Result Value Ref Range Status   MRSA by PCR NEGATIVE NEGATIVE Final    Comment:        The GeneXpert MRSA Assay (FDA approved for NASAL specimens only), is one component of a comprehensive MRSA colonization surveillance program. It is not intended to diagnose MRSA infection nor to guide or monitor treatment for MRSA infections.   Culture, blood (routine x 2)     Status: None (Preliminary result)   Collection Time: 03/22/14 12:06  PM  Result Value Ref Range Status   Specimen Description BLOOD RIGHT ARM  10 ML IN [facility] BOTTLE  Final   Special Requests NONE  Final   Culture   Final           BLOOD CULTURE RECEIVED NO GROWTH TO DATE CULTURE WILL BE HELD FOR 5 DAYS BEFORE ISSUING A FINAL NEGATIVE REPORT Performed at [facility]    Report Status PENDING  Incomplete     Scheduled Meds: . aspirin EC  81 mg Oral Daily  . atorvastatin  80 mg Oral q1800  . DAPTOmycin   400 mg Intravenous Q48H  . furosemide  40 mg Intravenous BID  . insulin aspart  0-20 Units Subcutaneous TID WC  . insulin aspart  0-5 Units Subcutaneous QHS  . insulin detemir  20 Units Subcutaneous QHS  . isosorbide-hydrALAZINE  1 tablet Oral BID  . levofloxacin  500 mg Oral Q48H  . metoprolol tartrate  50 mg Oral BID  . rifampin  300 mg Oral Q12H   Continuous Infusions:

## 2014-03-23 NOTE — Progress Notes (Signed)
RN notified the PCP on call that the patient was on a CIWA scale but there were no medication orders.  New orders were placed,

## 2014-03-23 NOTE — Progress Notes (Signed)
INITIAL NUTRITION ASSESSMENT  DOCUMENTATION CODES Per approved criteria  -Obesity Unspecified   INTERVENTION: Continue Prostat liquid protein PO 30 ml daily, each supplement provides 100 kcal, 15 grams protein. Provide Beneprotein powder with meals BID, per scoop provides 25 kcal and 6g protein. Encourage PO intake  NUTRITION DIAGNOSIS: Increased nutrient (protein) needs related to wound healing as evidenced by groin infection.   Goal: Pt to meet >/= 90% of their estimated nutrition needs   Monitor:  PO and supplemental intake, weight, labs, I/O's  Reason for Assessment: Consult for nutritional supplement  Admitting Dx: Acute respiratory failure with hypoxia  ASSESSMENT: 64 y.o. male 64 year old male with past medical history of diastolic heart failure, thrombectomy of the left limb of the aorto femoral bypass graft, extensive endarterectomy of the left common femoral artery, Dacron patch angioplasty of the right limb, right groin infection and graft infection status post I and D of abscess on 02/15/2014 which grew MRSA,  uncontrolled diabetes type 2 with last hemoglobin A1c of 7.6, chronic renal disease with baseline creatinine of 1.7-2.0 that comes in for shortness of breath that has progressively gotten worse for the last 3 weeks.   Spoke with pt at bedside, pt eating grapes from fruit basket he received. Pt reports good appetite with no changes. Pt reports UBW of 280 lb, unable to state when he last weighed this.   Pt re-admitted, last admitted and discharged from 01/26 to 01/31.  Pt with CHF, weight loss most likely d/t fluid loss from Lasix use. Fluid may be masking malnutrition status. Nutrition focused physical exam shows no sign of depletion of muscle mass or body fat.  Pt is currently ordered Prostat 30 ml daily. States he doesn't mind the supplement, encouraged pt to chase supplement with a beverage. RD to order Beneprotein in addition to Prostat.  Labs  reviewed: Elevated BUN & Creatinine  Height: Ht Readings from Last 1 Encounters:  2014-10-10 5\' 5"  (1.651 m)    Weight: Wt Readings from Last 1 Encounters:  03/23/14 200 lb 9.9 oz (91 kg)    Ideal Body Weight: 136 lb  % Ideal Body Weight: 147%  Wt Readings from Last 10 Encounters:  03/23/14 200 lb 9.9 oz (91 kg)  03/16/14 208 lb 6.4 oz (94.53 kg)  03/04/14 216 lb 4.8 oz (98.113 kg)  02/10/14 198 lb (89.812 kg)  01/22/14 201 lb (91.173 kg)  01/07/14 205 lb 14.4 oz (93.396 kg)  10/30/13 197 lb (89.359 kg)  08/28/13 195 lb (88.451 kg)  05/07/13 194 lb (87.998 kg)  02/11/13 197 lb (89.359 kg)    Usual Body Weight: 280 lb -per pt  % Usual Body Weight: 71%  BMI:  Body mass index is 33.38 kg/(m^2).  Estimated Nutritional Needs: Kcal: 1700-1900 Protein: 85-95g Fluid: Per MD -fluid restriction  Skin: groin incision  Diet Order: Diet Carb Modified  EDUCATION NEEDS: -No education needs identified at this time   Intake/Output Summary (Last 24 hours) at 03/23/14 0936 Last data filed at 03/23/14 0800  Gross per 24 hour  Intake  793.8 ml  Output    780 ml  Net   13.8 ml    Last BM: 2/6  Labs:   Recent Labs Lab 2014-10-10 1509 03/22/14 0352 03/23/14 0440  NA 136 138 137  K 4.0 4.0 4.4  CL 100 104 102  CO2 28 28 29   BUN 70* 69* 72*  CREATININE 2.92* 2.82* 2.96*  CALCIUM 8.4 8.1* 8.2*  GLUCOSE 230* 206* 140*  CBG (last 3)   Recent Labs  03/22/14 1332 03/22/14 1621 03/22/14 2113  GLUCAP 115* 148* 147*    Scheduled Meds: . antiseptic oral rinse  7 mL Mouth Rinse BID  . aspirin EC  81 mg Oral Daily  . atorvastatin  80 mg Oral q1800  . DAPTOmycin (CUBICIN)  IV  400 mg Intravenous Q48H  . feeding supplement (PRO-STAT SUGAR FREE 64)  30 mL Oral Daily  . furosemide  40 mg Intravenous BID  . insulin aspart  0-20 Units Subcutaneous TID WC  . insulin aspart  0-5 Units Subcutaneous QHS  . insulin detemir  20 Units Subcutaneous QHS  .  isosorbide-hydrALAZINE  1 tablet Oral BID  . levofloxacin  500 mg Oral Q48H  . metoprolol tartrate  50 mg Oral BID  . polyethylene glycol  17 g Oral Daily  . rifampin  300 mg Oral Q12H  . sodium chloride  3 mL Intravenous Q12H  . sodium chloride  3 mL Intravenous Q12H  . vitamin C  500 mg Oral BID    Continuous Infusions:   Past Medical History  Diagnosis Date  . Arteriosclerotic cardiovascular disease (ASCVD)     CABG in 2003 for 3 VD; 07/2007: Stress nuclear-inferior infarction without ischemia; normal EF by echo in 06/2005  . Diabetes mellitus type II   . Hyperlipidemia   . Degenerative joint disease     Left knee  . Cerebrovascular disease 2003    Carotid ultrasound in 2010: Patent right CEA site, 60-79% left internal carotid artery  . Peripheral vascular disease 2007    aortobifemoral BPG-2007  . Anemia   . Peripheral neuropathy   . Hypertension   . Sick sinus syndrome 1996    permanent pacemaker-1996; generator change in 2010  . Chronic obstructive pulmonary disease   . Obesity   . Obstructive sleep apnea   . Benign prostatic hypertrophy   . Gastroesophageal reflux disease   . Tobacco abuse, in remission     50 pack years total consumption; Quit in 2007    Past Surgical History  Procedure Laterality Date  . Coronary artery bypass graft  2003    three-vessel disease  . Iliac artery stent      Percutaneous intervention-right external iliac; unclear whether stent was placed  . Pacemaker insertion  1996    [employer] Accent DR dual-chamber device, serial number [id number]   . Carotid endarterectomy  2003    Right  . Pacemaker generator change  2010     [employer]. Jude Accent DR dual-chamber device, serial number [id number]   . Aorta - bilateral femoral artery bypass graft  2007  . Femoral-popliteal bypass graft  1998    bilateral  . Colonoscopy  2012    Negative screening study.  . Ileocolonoscopy  02/20/2007    WUJ:WJXBJY rectum, colon, and terminal ileum  . Femoral-femoral  bypass graft Bilateral 01/03/2014    Procedure: BYPASS GRAFT RIGHT FEMORAL-LEFT FEMORAL ARTERY ;  Surgeon: Chuck Hint, MD;  Location: [facility] OR;  Service: Vascular;  Laterality: Bilateral;  . Endarterectomy femoral Left 01/03/2014    Procedure: ENDARTERECTOMY FEMORAL;  Surgeon: Chuck Hint, MD;  Location: [facility] OR;  Service: Vascular;  Laterality: Left;  . Intraoperative arteriogram Left 01/03/2014    Procedure: INTRA OPERATIVE ARTERIOGRAM, LEFT LOWER LEG;  Surgeon: Chuck Hint, MD;  Location: [facility] OR;  Service: Vascular;  Laterality: Left;  . Abdominal angiogram N/A 01/02/2014    Procedure: ABDOMINAL ANGIOGRAM;  Surgeon: Arlys John  Rolena Infante, MD;  Location: [facility] CATH LAB;  Service: Cardiovascular;  Laterality: N/A;  . Peripheral vascular catheterization  01/02/2014    Procedure: LOWER EXTREMITY ANGIOGRAPHY;  Surgeon: Fransisco Hertz, MD;  Location: [facility] CATH LAB;  Service: Cardiovascular;;  . Arch aortogram  01/02/2014    Procedure: ARCH AORTOGRAM;  Surgeon: Fransisco Hertz, MD;  Location: [facility] CATH LAB;  Service: Cardiovascular;;  . Wound exploration Right 02/15/2014    Procedure: Incision and Drainage Right Groin;  Surgeon: Sherren Kerns, MD;  Location: [facility] OR;  Service: Vascular;  Laterality: Right;  . Left heart catheterization with coronary/graft angiogram N/A 02/20/2014    Procedure: LEFT HEART CATHETERIZATION WITH Isabel Caprice;  Surgeon: Micheline Chapman, MD;  Location: [facility] CATH LAB;  Service: Cardiovascular;  Laterality: N/A;  . Tram Right 02/21/2014    Procedure: RIGHT GRACILIS MUSCLE FLAP TO RIGHT GROIN ;  Surgeon: Glenna Fellows, MD;  Location: [facility] OR;  Service: Plastics;  Laterality: Right;    Tilda Franco, MS, RD, LDN Pager: [phone] After Hours Pager: [phone]

## 2014-03-24 LAB — CBC
HCT: 32.9 % — ABNORMAL LOW (ref 39.0–52.0)
Hemoglobin: 10.4 g/dL — ABNORMAL LOW (ref 13.0–17.0)
MCH: 27 pg (ref 26.0–34.0)
MCHC: 31.6 g/dL (ref 30.0–36.0)
MCV: 85.5 fL (ref 78.0–100.0)
Platelets: 362 10*3/uL (ref 150–400)
RBC: 3.85 MIL/uL — ABNORMAL LOW (ref 4.22–5.81)
RDW: 16.3 % — ABNORMAL HIGH (ref 11.5–15.5)
WBC: 12.9 10*3/uL — ABNORMAL HIGH (ref 4.0–10.5)

## 2014-03-24 LAB — TYPE AND SCREEN
ABO/RH(D): B POS
ANTIBODY SCREEN: NEGATIVE
UNIT DIVISION: 0
Unit division: 0

## 2014-03-24 LAB — URINE CULTURE
Colony Count: NO GROWTH
Culture: NO GROWTH

## 2014-03-24 LAB — BASIC METABOLIC PANEL
Anion gap: 7 (ref 5–15)
BUN: 72 mg/dL — ABNORMAL HIGH (ref 6–23)
CHLORIDE: 98 mmol/L (ref 96–112)
CO2: 26 mmol/L (ref 19–32)
CREATININE: 2.73 mg/dL — AB (ref 0.50–1.35)
Calcium: 8.1 mg/dL — ABNORMAL LOW (ref 8.4–10.5)
GFR calc Af Amer: 27 mL/min — ABNORMAL LOW (ref >90)
GFR, EST NON AFRICAN AMERICAN: 23 mL/min — AB (ref >90)
GLUCOSE: 123 mg/dL — AB (ref 70–99)
Potassium: 4.5 mmol/L (ref 3.5–5.1)
Sodium: 131 mmol/L — ABNORMAL LOW (ref 135–145)

## 2014-03-24 LAB — PREPARE RBC (CROSSMATCH)

## 2014-03-24 LAB — GLUCOSE, CAPILLARY: Glucose-Capillary: 96 mg/dL (ref 70–99)

## 2014-03-24 LAB — PROCALCITONIN: PROCALCITONIN: 1.22 ng/mL

## 2014-03-24 MED ORDER — FUROSEMIDE 10 MG/ML IJ SOLN
40.0000 mg | Freq: Once | INTRAMUSCULAR | Status: AC
  Administered 2014-03-24: 40 mg via INTRAVENOUS

## 2014-03-24 NOTE — Progress Notes (Addendum)
Patient was assessed and found to have dyspnea at rest,increased work of breathing and some fine crackles. Vitals were: 98.55F;HR74;RR24: 138/81:97% 2 L Kirk.  Respiratory was notified to administer a breathing treatment.  PCP was notified. Will continue to monitor the patient.

## 2014-03-24 NOTE — Progress Notes (Signed)
   03/24/14 1643  What Happened  Was fall witnessed? Yes  Who witnessed fall? [email or web address]  Patients activity before fall other (comment) (sitting)  Point of contact hip/leg;other (comment) (left)  Was patient injured? No  Follow Up  MD notified Dr. Izola PriceMyers  Time MD notified 1700  Family notified Yes-comment (Doloris Laviolette-wife)  Time family notified 1705  Additional tests No  Progress note created (see row info) Yes  Vitals  Temp 98.8 F (37.1 C)  Temp Source Oral  BP (!) 162/66 mmHg  BP Location Right Arm  BP Method Automatic  Patient Position (if appropriate) Lying  Pulse Rate 70  Pulse Rate Source Dinamap  Resp 20  Oxygen Therapy  SpO2 95 %  O2 Device Nasal Cannula  O2 Flow Rate (L/min) 2 L/min

## 2014-03-24 NOTE — Consult Note (Signed)
WOC wound consult note Reason for Consult:Wound care consult for lymphatic drainage following procedure in early January. Seen by Dr. Leta Baptisthimmappa (Plastics) three hours earlier today. Wound type: Dehisced surgical wound Pressure Ulcer POA: No: Dressing procedure/placement/frequency:Dressing orders are provided by Dr. Leta Baptisthimmappa (Plastics).  I have provided supplemental guidance to the nursing staff (adding a saline cleansing prior to the dressing change and use of a moisture barrier ointment to prevent maceration). WOC nursing team will not follow, but will remain available to this patient, the nursing and medical team.  Please re-consult if needed. Thanks, Ladona MowLaurie Billy Rocco, MSN, RN, GNP, [place], CWON-AP [phone])

## 2014-03-24 NOTE — Progress Notes (Signed)
RN re-de-dressed the dressing on the right upper thigh.   Lasix was given. Condom Catheter was placed.  Family continues to ask many.many questions.  Will continue to monitor the patient.

## 2014-03-24 NOTE — Progress Notes (Signed)
Patient is 4.5 weeks post gracilis muscle flap for coverage of prosthetic graft. Admitted 3 days ago for CHF exacerbation; I was made aware today. Per primary service, patient represented from facility without any compression or dressings in place. He was noted on my last exam 1.5 weeks ago to have a lymphatic leak/seroma that was self expressing through right medial thigh donor site.  No report of drainage from groin wound. Patient is poor historian and reports "not much drainage" but also no dressings done in facility. States he cannot wear TED hose as "too tight" and "cutting off my circulation"  Temp:  [98.1 F (36.7 C)-99 F (37.2 C)] 98.2 F (36.8 C) (02/08 1019) Pulse Rate:  [68-83] 70 (02/08 1019) Resp:  [20-23] 20 (02/08 1019) BP: (107-169)/(69-90) 157/84 mmHg (02/08 1019) SpO2:  [88 %-100 %] 93 % (02/08 1019) Weight:  [92.035 kg (202 lb 14.4 oz)] 92.035 kg (202 lb 14.4 oz) (02/08 0500)   PE:  Right groin : incision healed, soft  Right medial thigh: proximal incision intact now with two open areas; distal incision which was opened on last exam is now a pinpoint opening but still able to express copious clear seroma/lymphatic fluid --no cellulits   A/P: S/p gracilis flap with lymphatic leak that is spontaneously draining from donor site incision  Groin incision where flap is in place over graft is stable  Medial thigh donor site has seroma/lymphatic leak.: rec compression over area, which does not seem to have been done since his discharge from hospital. Will start dry dressing ACE wraps again. Pt refuses TED hose. Improve nutrition, on supplements, prealbumin pending.   Glenna FellowsBrinda Shelitha Magley, MD [facility] Plastic [facility] Reconstructive Surgery [phone]

## 2014-03-24 NOTE — Progress Notes (Signed)
Heared the chair alarm, came running to patients room, patient  was already sitting sideways and  about to stand up.  I was  2 steps away from the patient when he slid from the chair, left hip hit floor first..Patient assessed, no injury noted, no complaints of any pain or discomfort. Wife Doloris made aware. MD made aware.

## 2014-03-24 NOTE — Progress Notes (Signed)
Student RN in the room called my attention, right inner thigh leaking , bed soaking wet with clear yellow liquid, wound noted to be dehisced, dressing applied.  Patient no complaints of any pain  Or discomfort. at this time. MD made aware.

## 2014-03-24 NOTE — Progress Notes (Signed)
Patient ID: [name], male   DOB: 14-Aug-1950, 64 y.o.   MRN: [id number]  [facility] PROGRESS NOTE  [name] NLZ:[id number] DOB: 10-27-1950 DOA: 04/02/2014 PCP: Vic Blackbird, MD  Brief narrative:    64 y.o. 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 For the last 2 days he has not been able to lay flat to sleep and can not walk to the bathroom without getting short of breath. He denies any chest pain, fever, diarrhea or vomiting. He relates his leg has been swollen up.  In the ED: Elevated BNP, CXR with bilateral pulmonary interstitial edema and TR[facility] asked to admit for further management, SDU requested.   Assessment/Plan:    Acute respiratory failure with hypoxia due to Acute diastolic heart failure, NYHA class 3/minimal elevation in troponins: - pt started on Lasix infusion in ED and was continued in SDU, weight is trending down from 207 lbs --> 202 lbs - stopped Lasix infusion 2/6 and transitioned to Lasix 40 mg IV BID  - continue to monitor daily weights, strict I's and O's, monitor renal function as well  - Restrict fluid intake and allow low-sodium diet. - Had mild elevation in troponins on admission but EKG did not show any ST segment abnormalities. 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, CKD - started on Levemir with SSI for additional coverage - increased Levemir to 20 U QHS for better diabetic control   Sepsis  - criteria met for sepsis on admission with T 100F, RR 24 - 29 bpm, oxygen sat 86% on RA, WBC 13.5, source unclear at this time - CXR with no sign of PNA, UA with small leukocytes so possible source, but more worrisome given recent right groin and graft infection and s/p I&D of abscess 02/15/2014 which grew MRSA, pt started on daptomycin and rifampin at that time 02/28/2014 and to continue for 6 weeks from that date - pt with low grade fevers and WBC 12.9 this AM - will continue Daptomycin, Rifampin, pt has completed Levaquin  - repeat blood culture pending  - repeat CBC in AM, consider ID consult if persistent fevers  - wound care consult   Acute encephalopathy - clearer mental status this AM but still somewhat confused   AKI on chronic kidney disease stage IV (acute kidney injury) - most likely due to acute decompensated diastolic heart failure with concern of ? cardiorenal syndrome. - started on Lasix infusion and changed to Lasix 40 mg BID IV - Cr trend: 2.92 --> 2.82 --> 2.96 --> 2.72  Essential hypertension - BP stable this  AM, continue  - continue Metoprolol 50 mg BID PO, Lasix 40 mg IV BID, isosorbide -hydralazine BID  Right groin femoral graft infection: - s/p gracilis flap with lymphatic leak, spontaneous draining  - Continue IV daptomycin and oral rifampin for 6 weeks the starting date was 02/28/2014. - Right medial thigh:proximal incision intact with two open areas; distal incision with small opening with serous drainage, no cellulitis  - continue with dry dressing ACE wraps.   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  Family Communication: plan of care discussed with the patient Disposition Plan: not ready for d/c    IV access:  Peripheral IV  Procedures and diagnostic studies:   Dg Chest 2 View 03/17/2014 Cardiomegaly with perihilar opacities suggesting edema, increased since previously. Small pleural effusions.   Ct Head Wo Contrast 02/25/2014 No acute intracranial process. 2. Partially visualized right ethmoid sinus disease.   Dg Chest Port 1 View 02/27/2014 Congestive heart failure with pulmonary interstitial edema and small bilateral pleural effusions and persistent left lower lobe atelectasis. There has not been significant interval change since yesterday's study.   Medical Consultants:  None  Other Consultants:  None  IAnti-Infectives:   Daptomycin 1/15 --> Rifampin 1/15 --> Levaquin 2/5 --> 2/7   Faye Ramsay, MD  [facility] Pager [phone]  If 7PM-7AM, please contact night-coverage [email or web address] Password [email or web address] 03/24/2014, 10:35 AM   LOS: 3 days   HPI/Subjective: No events overnight.   Objective: Filed Vitals:   03/24/14 0629 03/24/14 0830 03/24/14 0918 03/24/14 1019  BP: 107/83 163/75 163/75 157/84  Pulse: 83 70 70 70  Temp: 98.1 F (36.7 C) 98.4 F (36.9 C)  98.2 F (36.8 C)  TempSrc:  Oral  Oral  Resp: $Remo'20 22  20  'uNdWu$ Height:      Weight:      SpO2: 93% 96%  93%    Intake/Output Summary (Last 24 hours) at 03/24/14 1035 Last data filed at 03/24/14 0139  Gross per 24 hour  Intake    923 ml  Output    400 ml  Net    523 ml    Exam:   General:  Pt is  alert, follows commands appropriately, not in acute distress  Cardiovascular: Regular rate and rhythm, S1/S2, no murmurs, no rubs, no gallops  Respiratory: Clear to auscultation bilaterally, no wheezing, mild crackles at bases   Abdomen: Soft, non tender, non distended, bowel sounds present, no guarding  Neuro: Grossly nonfocal  Data Reviewed: Basic Metabolic Panel:  Recent Labs Lab 04/12/2014 1509 03/22/14 0352 03/23/14 0440 03/24/14 0507  NA 136 138 137 131*  K 4.0 4.0 4.4 4.5  CL 100 104 102 98  CO2 $Re'28 28 29 26  'msD$ GLUCOSE 230* 206* 140* 123*  BUN 70* 69* 72* 72*  CREATININE 2.92* 2.82* 2.96* 2.73*  CALCIUM 8.4 8.1* 8.2* 8.1*   CBC:  Recent Labs Lab 03/24/2014 1509 03/22/14 0352 03/23/14 0440 03/24/14 0507  WBC 13.5* 10.8* 11.0* 12.9*  HGB 7.9* 7.4* 7.2* 10.4*  HCT 26.3* 24.5* 23.6* 32.9*  MCV 83.8 83.6 83.1 85.5  PLT 391 344 324 362   Cardiac  Enzymes:  Recent Labs Lab 03/18/2014 1542 03/22/14 0352  CKTOTAL  --  90  TROPONINI 0.15*  --    BNP: Invalid input(s): POCBNP CBG:  Recent Labs Lab 03/23/14 0759 03/23/14 1202 03/23/14 1714 03/23/14 2203 03/24/14 0736  GLUCAP 153* 188* 180* 209* 96    Recent Results (from the past 240 hour(s))  MRSA PCR Screening     Status: None   Collection Time: 04/05/2014  8:30 PM  Result Value Ref Range Status   MRSA by PCR NEGATIVE NEGATIVE Final    Comment:        The GeneXpert MRSA Assay (FDA approved for NASAL specimens only), is one component of a comprehensive MRSA colonization surveillance program. It is not intended to diagnose MRSA infection nor to guide or monitor treatment for MRSA infections.   Culture, blood (routine x 2)     Status: None (Preliminary result)   Collection Time: 03/22/14 12:06 PM  Result Value Ref Range Status   Specimen Description BLOOD RIGHT ARM  10 ML IN [facility] BOTTLE  Final   Special Requests NONE  Final   Culture   Final           BLOOD CULTURE RECEIVED NO GROWTH TO DATE  CULTURE WILL BE HELD FOR 5 DAYS BEFORE ISSUING A FINAL NEGATIVE REPORT Performed at [facility]    Report Status PENDING  Incomplete  Urine culture     Status: None   Collection Time: 03/22/14  3:24 PM  Result Value Ref Range Status   Specimen Description URINE, CATHETERIZED  Final   Special Requests NONE  Final   Colony Count NO GROWTH Performed at [facility]   Final   Culture NO GROWTH Performed at [facility]   Final   Report Status 03/24/2014 FINAL  Final  Culture, blood (routine x 2)     Status: None (Preliminary result)   Collection Time: 03/22/14  3:30 PM  Result Value Ref Range Status   Specimen Description BLOOD RIGHT HAND  Final   Special Requests BOTTLES DRAWN AEROBIC AND ANAEROBIC 5CC EACH  Final   Culture   Final           BLOOD CULTURE RECEIVED NO GROWTH TO DATE CULTURE WILL BE HELD FOR 5 DAYS BEFORE ISSUING A FINAL NEGATIVE REPORT Performed at [facility]    Report Status PENDING  Incomplete     Scheduled Meds: . antiseptic oral rinse  7 mL Mouth Rinse BID  . aspirin EC  81 mg Oral Daily  . atorvastatin  80 mg Oral q1800  . DAPTOmycin (CUBICIN)  IV  400 mg Intravenous Q48H  . feeding supplement (PRO-STAT SUGAR FREE 64)  30 mL Oral Daily  . furosemide  40 mg Intravenous BID  . insulin aspart  0-20 Units Subcutaneous TID WC  . insulin aspart  0-5 Units Subcutaneous QHS  . insulin detemir  20 Units Subcutaneous QHS  . isosorbide-hydrALAZINE  1 tablet Oral BID  . metoprolol tartrate  50 mg Oral BID  . polyethylene glycol  17 g Oral Daily  . protein supplement  1 scoop Oral BID WC  . rifampin  300 mg Oral Q12H  . sodium chloride  3 mL Intravenous Q12H  . sodium chloride  3 mL Intravenous Q12H  . vitamin C  500 mg Oral BID   Continuous Infusions:

## 2014-03-24 NOTE — Consult Note (Signed)
[facility]  Subjective  - Pt admitted with CHF/pneumonia.  Known to us as my partner Dr Edilia Boickson revised his aortobifem a few months ago.  Recent groin infection treated with muscle flap to right groin.  Pt with drainage right mid thigh not groin   Objective 138/81 74 98.2 F (36.8 C) (Oral) 24 97%  Intake/Output Summary (Last 24 hours) at 03/24/14 2130 Last data filed at 03/24/14 1500  Gross per 24 hour  Intake    954 ml  Output    450 ml  Net    504 ml   Right groin well healed no erythema or fluctuance Right mid thigh continuous serous drainage no purulence, no erythema  Assessment/Planning: Agree with plastic surgery regarding wound assessment No current evidence to suggest worsening graft infection Pt has follow up with Dr Edilia Boickson Feb 18. If continued problems with wounds or needing further eval related to [facility] graft [facility]  FIELDS,CHARLES E 03/24/2014 9:30 PM --  Laboratory Lab Results:  Recent Labs  03/23/14 0440 03/24/14 0507  WBC 11.0* 12.9*  HGB 7.2* 10.4*  HCT 23.6* 32.9*  PLT 324 362   BMET  Recent Labs  03/23/14 0440 03/24/14 0507  NA 137 131*  K 4.4 4.5  CL 102 98  CO2 29 26  GLUCOSE 140* 123*  BUN 72* 72*  CREATININE 2.96* 2.73*  CALCIUM 8.2* 8.1*    COAG Lab Results  Component Value Date   INR 1.17 02/12/2014   INR 1.02 01/03/2014   INR 1.01 12/31/2013   No results found for: PTT

## 2014-03-25 LAB — CBC
HEMATOCRIT: 32.6 % — AB (ref 39.0–52.0)
HEMOGLOBIN: 10 g/dL — AB (ref 13.0–17.0)
MCH: 26.4 pg (ref 26.0–34.0)
MCHC: 30.7 g/dL (ref 30.0–36.0)
MCV: 86 fL (ref 78.0–100.0)
Platelets: 369 10*3/uL (ref 150–400)
RBC: 3.79 MIL/uL — ABNORMAL LOW (ref 4.22–5.81)
RDW: 16.9 % — ABNORMAL HIGH (ref 11.5–15.5)
WBC: 12.2 10*3/uL — ABNORMAL HIGH (ref 4.0–10.5)

## 2014-03-25 LAB — GLUCOSE, CAPILLARY
GLUCOSE-CAPILLARY: 118 mg/dL — AB (ref 70–99)
GLUCOSE-CAPILLARY: 159 mg/dL — AB (ref 70–99)
Glucose-Capillary: 145 mg/dL — ABNORMAL HIGH (ref 70–99)
Glucose-Capillary: 158 mg/dL — ABNORMAL HIGH (ref 70–99)
Glucose-Capillary: 129 mg/dL — ABNORMAL HIGH (ref 70–99)
Glucose-Capillary: 119 mg/dL — ABNORMAL HIGH (ref 70–99)
Glucose-Capillary: 133 mg/dL — ABNORMAL HIGH (ref 70–99)

## 2014-03-25 LAB — BASIC METABOLIC PANEL
Anion gap: 10 (ref 5–15)
BUN: 84 mg/dL — AB (ref 6–23)
CO2: 27 mmol/L (ref 19–32)
Calcium: 8.6 mg/dL (ref 8.4–10.5)
Chloride: 100 mmol/L (ref 96–112)
Creatinine, Ser: 2.88 mg/dL — ABNORMAL HIGH (ref 0.50–1.35)
GFR calc Af Amer: 25 mL/min — ABNORMAL LOW (ref >90)
GFR, EST NON AFRICAN AMERICAN: 22 mL/min — AB (ref >90)
GLUCOSE: 155 mg/dL — AB (ref 70–99)
POTASSIUM: 5.2 mmol/L — AB (ref 3.5–5.1)
SODIUM: 137 mmol/L (ref 135–145)

## 2014-03-25 LAB — PREALBUMIN: PREALBUMIN: 4.2 mg/dL — AB (ref 17.0–34.0)

## 2014-03-25 NOTE — Progress Notes (Signed)
ANTIBIOTIC CONSULT NOTE - Follow up  Pharmacy Consult for Daptomycin Indication: MRSA groin abscess  Allergies  Allergen Reactions  . Morphine And Related     Pt felt flushed and was diaphoretic     Patient Measurements: Height:  (165.1 cm) Weight: 207 lb 14.3 oz (94.3 kg) IBW/kg (Calculated) : 61.5  Vital Signs: Temp: 98.6 F (37 C) (02/09 0642) Temp Source: Oral (02/09 0642) BP: 149/84 mmHg (02/09 0642) Pulse Rate: 68 (02/09 0642) Intake/Output from previous day: 02/08 0701 - 02/09 0700 In: 376 [P.O.:360; I.V.:16] Out: 600 [Urine:600] Intake/Output from this shift:    Labs:  Recent Labs  03/23/14 0440 03/24/14 0507 03/25/14 0430  WBC 11.0* 12.9* 12.2*  HGB 7.2* 10.4* 10.0*  PLT 324 362 369  CREATININE 2.96* 2.73* 2.88*   Estimated Creatinine Clearance: 27.3 mL/min (by C-G formula based on Cr of 2.88). No results for input(s): VANCOTROUGH, VANCOPEAK, VANCORANDOM, GENTTROUGH, GENTPEAK, GENTRANDOM, TOBRATROUGH, TOBRAPEAK, TOBRARND, AMIKACINPEAK, AMIKACINTROU, AMIKACIN in the last 72 hours.   Microbiology: Recent Results (from the past 720 hour(s))  Clostridium Difficile by PCR     Status: None   Collection Time: 03/03/14  5:31 PM  Result Value Ref Range Status   C difficile by pcr NEGATIVE NEGATIVE Final  MRSA PCR Screening     Status: Abnormal   Collection Time: 03/14/14 10:05 AM  Result Value Ref Range Status   MRSA by PCR POSITIVE (A) NEGATIVE Final    Comment:        The GeneXpert MRSA Assay (FDA approved for NASAL specimens only), is one component of a comprehensive MRSA colonization surveillance program. It is not intended to diagnose MRSA infection nor to guide or monitor treatment for MRSA infections. RESULT CALLED TO, READ BACK BY AND VERIFIED WITH: T. LUCAS RN 13:00 03/14/14 ([email or web address])   MRSA PCR Screening     Status: None   Collection Time: 19-Apr-2014  8:30 PM  Result Value Ref Range Status   MRSA by PCR NEGATIVE NEGATIVE Final     Comment:        The GeneXpert MRSA Assay (FDA approved for NASAL specimens only), is one component of a comprehensive MRSA colonization surveillance program. It is not intended to diagnose MRSA infection nor to guide or monitor treatment for MRSA infections.   Culture, blood (routine x 2)     Status: None (Preliminary result)   Collection Time: 03/22/14 12:06 PM  Result Value Ref Range Status   Specimen Description BLOOD RIGHT ARM  10 ML IN [facility] BOTTLE  Final   Special Requests NONE  Final   Culture   Final           BLOOD CULTURE RECEIVED NO GROWTH TO DATE CULTURE WILL BE HELD FOR 5 DAYS BEFORE ISSUING A FINAL NEGATIVE REPORT Performed at [facility]    Report Status PENDING  Incomplete  Urine culture     Status: None   Collection Time: 03/22/14  3:24 PM  Result Value Ref Range Status   Specimen Description URINE, CATHETERIZED  Final   Special Requests NONE  Final   Colony Count NO GROWTH Performed at [facility]   Final   Culture NO GROWTH Performed at [facility]   Final   Report Status 03/24/2014 FINAL  Final  Culture, blood (routine x 2)     Status: None (Preliminary result)   Collection Time: 03/22/14  3:30 PM  Result Value Ref Range Status   Specimen Description BLOOD RIGHT HAND  Final   Special Requests BOTTLES DRAWN AEROBIC AND ANAEROBIC 5CC EACH  Final   Culture   Final           BLOOD CULTURE RECEIVED NO GROWTH TO DATE CULTURE WILL BE HELD FOR 5 DAYS BEFORE ISSUING A FINAL NEGATIVE REPORT [facility]    Report Status PENDING  Incomplete    Medical History: Past Medical History  Diagnosis Date  . Arteriosclerotic cardiovascular disease (ASCVD)     CABG in 2003 for 3 VD; 07/2007: Stress nuclear-inferior infarction without ischemia; normal EF by echo in 06/2005  . Diabetes mellitus type II   . Hyperlipidemia   . Degenerative joint disease     Left knee  . Cerebrovascular disease 2003    Carotid ultrasound in  2010: Patent right CEA site, 60-79% left internal carotid artery  . Peripheral vascular disease 2007    aortobifemoral BPG-2007  . Anemia   . Peripheral neuropathy   . Hypertension   . Sick sinus syndrome 1996    permanent pacemaker-1996; generator change in 2010  . Chronic obstructive pulmonary disease   . Obesity   . Obstructive sleep apnea   . Benign prostatic hypertrophy   . Gastroesophageal reflux disease   . Tobacco abuse, in remission     50 pack years total consumption; Quit in 2007    Medications:  Scheduled:  . antiseptic oral rinse  7 mL Mouth Rinse BID  . aspirin EC  81 mg Oral Daily  . atorvastatin  80 mg Oral q1800  . DAPTOmycin (CUBICIN)  IV  400 mg Intravenous Q48H  . feeding supplement (PRO-STAT SUGAR FREE 64)  30 mL Oral Daily  . furosemide  40 mg Intravenous BID  . insulin aspart  0-20 Units Subcutaneous TID WC  . insulin aspart  0-5 Units Subcutaneous QHS  . insulin detemir  20 Units Subcutaneous QHS  . isosorbide-hydrALAZINE  1 tablet Oral BID  . metoprolol tartrate  50 mg Oral BID  . polyethylene glycol  17 g Oral Daily  . protein supplement  1 scoop Oral BID WC  . rifampin  300 mg Oral Q12H  . sodium chloride  3 mL Intravenous Q12H  . vitamin C  500 mg Oral BID   Infusions:    PRN: acetaminophen **OR** acetaminophen, albuterol, calcium carbonate, ondansetron **OR** ondansetron (ZOFRAN) IV, polyethylene glycol, sodium chloride, sodium chloride, traMADol  Assessment: 64 y.o M with hx of thrombectomy of the left femoral limb and subsequently developed right groin abscess with suspected right femoral graft infection. S/p I&D on 02/15/14 and cultures grew out MRSA. He was on vancomycin but was switched to daptomycin d/t acute renal failure and acummulation of vancomycin. Plan is to treat with daptomycin and rifampin for 6 weeks, start date 02/28/14 (stop date would be 04/11/14). Pharmacy is consulted to continue dosing daptomycin during this  admission.  He is also on levaquin day 10 of 14 for pneumonia.  [facility] with worsening SOB with inability to lie flat to sleep, likely acute diastolic heart failure.  2/6 ck 90 2/9 Scr 2.88, CrCl ~4227mls/min  Goal of Therapy:  Eradication of infection Dose appropriate for indication, renal function  Plan:   Continue daptomycin 400mg  (4 mg/kg for soft tissue infection) IV q48h  Check ck weekly  Continue to monitor renal function and adjust dosing interval as needed  Arley Phenixllen Cordie Beazley RPh 03/25/2014, 10:49 AM Pager [phone]

## 2014-03-25 NOTE — Progress Notes (Addendum)
Patient ID: [name], male   DOB: February 05, 1951, 64 y.o.   MRN: [id number]  [facility] PROGRESS NOTE  [name] JQB:[id number] DOB: 11-Aug-1950 DOA: 04/05/2014 PCP: Vic Blackbird, MD  Brief narrative:    64 y.o. 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 For the last 2 days he has not been able to lay flat to sleep and can not walk to the bathroom without getting short of breath. He denies any chest pain, fever, diarrhea or vomiting. He relates his leg has been swollen up.  In the ED: Elevated BNP, CXR with bilateral pulmonary interstitial edema and TRH asked to admit for further management, SDU requested.   Assessment/Plan:    Acute respiratory failure with hypoxia due to Acute diastolic heart failure, NYHA class 3/minimal elevation in troponins: - pt started on Lasix infusion in ED and was continued in SDU, weight is trending down from 207 lbs --> 202 lbs --> 207 lbs  - stopped Lasix infusion 2/6 and transitioned to Lasix 40 mg IV BID  - pt may need increasing dose of Lasix if weight continues trending up and based on respiratory status  - continue to monitor daily weights, strict I's and O's, monitor renal function as well  - Restrict fluid intake and allow low-sodium diet. - Had mild elevation in troponins on admission but EKG did not show any ST segment abnormalities. 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 Oneida Alar VS, no signs of graft infection and per plastic surgery team, will need frequent wound dressing changes  - pt afebrile over the past 24 hours and WBC 12.9 --> 12.2 - will continue Daptomycin, Rifampin - repeat blood culture pending  - repeat CBC in AM  Acute encephalopathy - clearer mental status this AM  AKI on chronic kidney disease stage IV (acute kidney injury) - most likely due to acute decompensated diastolic heart failure with concern of ? cardiorenal syndrome. 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 - Right medial thigh:proximal incision intact with two open areas; distal incision with small opening with serous drainage, no cellulitis  - continue with dry dressing ACE wraps.  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  Family Communication: plan of care discussed with the patient Disposition Plan: not ready for d/c as requires more diuresis, possible in 1-2 days   IV access:  Peripheral IV  Procedures and diagnostic studies:   Dg Chest 2 View 03/22/2014 Cardiomegaly with perihilar opacities suggesting edema, increased since previously. Small pleural effusions.   Ct Head Wo Contrast 02/25/2014 No acute intracranial process. 2. Partially visualized right ethmoid sinus disease.   Dg Chest Port 1 View 02/27/2014 Congestive heart failure with pulmonary interstitial edema and small bilateral pleural effusions and persistent left lower lobe atelectasis. There has not been significant interval change since yesterday's study.   Medical Consultants:  Vascular surgery Plastic surgery   Other Consultants:  None  IAnti-Infectives:   Daptomycin 1/15 --> Rifampin 1/15 --> Levaquin 2/5 --> 2/7    Faye Ramsay, MD  [facility] Pager [phone]  If 7PM-7AM, please contact night-coverage [email or web address] Password [facility] 03/25/2014, 9:14 PM   LOS: 4 days    HPI/Subjective: No events overnight.   Objective: Filed Vitals:   03/24/14 2254 03/25/14 0642 03/25/14 1428 03/25/14 2014  BP: 160/77 149/84 110/47 155/78  Pulse: 70 68 73 72  Temp:  98.6 F (37 C)  97.7 F (36.5 C)  TempSrc:  Oral  Oral  Resp:  _0 Height:      Weight:  94.3 kg (207 lb 14.3 oz)    SpO2:  97% 90% 97%    Intake/Output Summary (Last 24 hours) at 03/25/14 2114 Last data filed at 03/25/14 1300  Gross per 24 hour  Intake     10 ml  Output    600 ml  Net   -590 ml    Exam:   General:  Pt is alert, follows commands appropriately, not in acute distress  Cardiovascular: Regular rate and rhythm,  no rubs, no gallops  Respiratory: Clear to auscultation bilaterally, no wheezing, bibasilar crackles   Abdomen: Soft, non tender, non distended, bowel sounds present, no guarding  Extremities:  Right groin well healed no erythema or fluctuance, R mid thigh continuous serous drainage no purulence, no erythema  Neuro:  Grossly nonfocal  Data Reviewed: Basic Metabolic Panel:  Recent Labs Lab 03/28/2014 1509 03/22/14 0352 03/23/14 0440 03/24/14 0507 03/25/14 0430  NA 136 138 137 131* 137  K 4.0 4.0 4.4 4.5 5.2*  CL 100 104 102 98 100  CO2 _0 GLUCOSE 230* 206* 140* 123* 155*  BUN 70* 69* 72* 72* 84*  CREATININE 2.92* 2.82* 2.96* 2.73* 2.88*  CALCIUM 8.4 8.1* 8.2* 8.1* 8.6   CBC:  Recent Labs Lab 03/24/2014 1509 03/22/14 0352 03/23/14 0440 03/24/14 0507 03/25/14 0430  WBC 13.5* 10.8* 11.0* 12.9* 12.2*  HGB 7.9* 7.4* 7.2* 10.4* 10.0*  HCT 26.3* 24.5* 23.6* 32.9* 32.6*  MCV 83.8 83.6 83.1 85.5 86.0  PLT 391 344 324 362 369   Cardiac Enzymes:  Recent Labs Lab 04/07/2014 1542 03/22/14 0352  CKTOTAL  --  90  TROPONINI 0.15*  --    BNP: Invalid input(s): POCBNP CBG:  Recent Labs Lab 03/24/14 1708 03/24/14 2107 03/25/14 0800 03/25/14 1234 03/25/14 1619  GLUCAP 145* 158* 129* 159* 119*    Recent Results (from the past  240 hour(s))  MRSA PCR Screening     Status: None   Collection Time: 04/02/2014  8:30 PM  Result Value Ref Range Status   MRSA by PCR NEGATIVE NEGATIVE Final    Comment:        The GeneXpert MRSA Assay (FDA approved for NASAL specimens only), is one component of a comprehensive MRSA colonization surveillance program. It is not intended to diagnose MRSA infection nor to guide or monitor treatment for MRSA infections.   Culture, blood (routine x 2)     Status: None (Preliminary result)   Collection Time: 03/22/14 12:06 PM  Result Value Ref Range Status   Specimen Description BLOOD RIGHT ARM  10 ML IN [facility] BOTTLE  Final   Special Requests NONE  Final   Culture   Final           BLOOD CULTURE RECEIVED NO GROWTH TO DATE CULTURE WILL BE HELD FOR 5 DAYS BEFORE ISSUING A FINAL NEGATIVE REPORT Performed at [facility]    Report Status PENDING  Incomplete  Urine culture     Status: None   Collection Time: 03/22/14  3:24 PM  Result Value Ref Range Status   Specimen Description URINE, CATHETERIZED  Final   Special Requests NONE  Final   Colony Count NO GROWTH Performed at [facility]   Final   Culture NO GROWTH Performed at [facility]   Final   Report Status 03/24/2014 FINAL  Final  Culture, blood (routine x 2)     Status: None (Preliminary result)   Collection Time: 03/22/14  3:30 PM  Result Value Ref Range Status   Specimen Description BLOOD RIGHT HAND  Final   Special Requests BOTTLES DRAWN AEROBIC AND ANAEROBIC 5CC EACH  Final   Culture   Final           BLOOD CULTURE RECEIVED NO GROWTH TO DATE CULTURE WILL BE HELD FOR 5 DAYS BEFORE ISSUING A FINAL NEGATIVE REPORT Performed at [facility]    Report Status PENDING  Incomplete     Scheduled Meds: . antiseptic oral rinse  7 mL Mouth Rinse BID  . aspirin EC  81 mg Oral Daily  . atorvastatin  80 mg Oral q1800  . DAPTOmycin (CUBICIN)  IV  400 mg Intravenous Q48H  . feeding supplement  (PRO-STAT SUGAR FREE 64)  30 mL  Oral Daily  . furosemide  40 mg Intravenous BID  . insulin aspart  0-20 Units Subcutaneous TID WC  . insulin aspart  0-5 Units Subcutaneous QHS  . insulin detemir  20 Units Subcutaneous QHS  . isosorbide-hydrALAZINE  1 tablet Oral BID  . metoprolol tartrate  50 mg Oral BID  . polyethylene glycol  17 g Oral Daily  . protein supplement  1 scoop Oral BID WC  . rifampin  300 mg Oral Q12H  . sodium chloride  3 mL Intravenous Q12H  . vitamin C  500 mg Oral BID   Continuous Infusions:

## 2014-03-25 NOTE — Progress Notes (Signed)
Clinical Social Work Department BRIEF PSYCHOSOCIAL ASSESSMENT 03/25/2014  Patient:  Bonney LeitzWITHERS,Marcelis F     Account Number:  [id number]     Admit date:  2014-04-21  Clinical Social Worker:  Hampton AbbotGRANT,Brysan Mcevoy, CLINICAL SOCIAL WORKER  Date/Time:  03/25/2014 10:36 AM  Referred by:  Physician  Date Referred:  03/25/2014 Referred for  SNF Placement   Other Referral:   Interview type:  Family Other interview type:   Spouse-Delores Boyington    PSYCHOSOCIAL DATA Living Status:  FACILITY Admitted from facility:  [facility] Level of care:  Skilled Nursing Facility Primary support name:  Rikki SpearingDelores Fernholz Primary support relationship to patient:  SPOUSE Degree of support available:   Adequate    CURRENT CONCERNS Current Concerns  Post-Acute Placement   Other Concerns:    SOCIAL WORK ASSESSMENT / PLAN [facility]   Assessment/plan status:  [facility] Other assessment/ plan:   Information/referral to community resources:   CSW to contact [facility] to make sure they are able to accept patient back.    PATIENT'S/FAMILY'S RESPONSE TO PLAN OF CARE: CSW spoke with wife Delores via phone, introduced self and explained role. Patient is a little confused at the time. Wife confirmed of patient being from [facility] of  and plans for patient return to SNF when medically stable and ready for DC. CSW informed wife that we are awaiting PT evaluation and will keep family posted with further DC plans. Family appreciative. CSW will continue to follow.       Hampton AbbotKadijah Gianny Sabino BSW Intern ]

## 2014-03-26 ENCOUNTER — Encounter: Payer: Self-pay | Admitting: Internal Medicine

## 2014-03-26 ENCOUNTER — Inpatient Hospital Stay (HOSPITAL_COMMUNITY): Payer: Medicare HMO

## 2014-03-26 LAB — CBC
HEMATOCRIT: 32.4 % — AB (ref 39.0–52.0)
Hemoglobin: 10.1 g/dL — ABNORMAL LOW (ref 13.0–17.0)
MCH: 26.6 pg (ref 26.0–34.0)
MCHC: 31.2 g/dL (ref 30.0–36.0)
MCV: 85.5 fL (ref 78.0–100.0)
Platelets: 352 10*3/uL (ref 150–400)
RBC: 3.79 MIL/uL — ABNORMAL LOW (ref 4.22–5.81)
RDW: 17 % — AB (ref 11.5–15.5)
WBC: 10.3 10*3/uL (ref 4.0–10.5)

## 2014-03-26 LAB — BASIC METABOLIC PANEL
ANION GAP: 12 (ref 5–15)
BUN: 96 mg/dL — ABNORMAL HIGH (ref 6–23)
CALCIUM: 8.5 mg/dL (ref 8.4–10.5)
CO2: 24 mmol/L (ref 19–32)
Chloride: 101 mmol/L (ref 96–112)
Creatinine, Ser: 3.21 mg/dL — ABNORMAL HIGH (ref 0.50–1.35)
GFR calc non Af Amer: 19 mL/min — ABNORMAL LOW (ref >90)
GFR, EST AFRICAN AMERICAN: 22 mL/min — AB (ref >90)
Glucose, Bld: 122 mg/dL — ABNORMAL HIGH (ref 70–99)
Potassium: 5.6 mmol/L — ABNORMAL HIGH (ref 3.5–5.1)
SODIUM: 137 mmol/L (ref 135–145)

## 2014-03-26 LAB — GLUCOSE, CAPILLARY
Glucose-Capillary: 105 mg/dL — ABNORMAL HIGH (ref 70–99)
Glucose-Capillary: 120 mg/dL — ABNORMAL HIGH (ref 70–99)
Glucose-Capillary: 113 mg/dL — ABNORMAL HIGH (ref 70–99)
Glucose-Capillary: 183 mg/dL — ABNORMAL HIGH (ref 70–99)

## 2014-03-26 MED ORDER — CLONIDINE HCL 0.1 MG PO TABS
0.2000 mg | ORAL_TABLET | Freq: Four times a day (QID) | ORAL | Status: DC | PRN
  Administered 2014-03-27 – 2014-03-28 (×2): 0.2 mg via ORAL
  Filled 2014-03-26 (×3): qty 2

## 2014-03-26 MED ORDER — FUROSEMIDE 10 MG/ML IJ SOLN
40.0000 mg | Freq: Three times a day (TID) | INTRAMUSCULAR | Status: DC
  Administered 2014-03-26 – 2014-03-27 (×5): 40 mg via INTRAVENOUS
  Filled 2014-03-26 (×5): qty 4

## 2014-03-26 MED ORDER — CLONIDINE HCL 0.1 MG PO TABS: 0.1000 mg | ORAL_TABLET | Freq: Four times a day (QID) | ORAL | Status: DC | PRN

## 2014-03-26 NOTE — Progress Notes (Addendum)
[facility] Progress Note   [name] UXL:[id number] DOB: 02-24-1950 DOA: 03/18/2014 PCP: Milinda Antis, MD  Brief narrative: [name] is a 64 y.o. 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  He comes in for shortness of breath and hypoxia. For the last 2 days he has not been able to lay flat to sleep and can not walk to the bathroom without getting short of breath. CXR reveals pulm edema. Has chronic pedal edema that was unchanged.  Found to have a fever in the ER as well. Temp was 101 on 2/6.   Subjective: Non communicative today- alert- oxygen around forehead instead of nose.   Assessment/Plan: Principal Problem:   Acute respiratory failure with hypoxia- acute on chronic diastolic CHF- mild troponin elevation without chest pain - suspect acute diastolic CHF- started on lasix infusion, transitioned to BID Lasix - repeat CXR shows continued edema- increased Lasix to TID - try to obtain strict control of BP  - repeat ECHO today as last one is from last year  Active Problems: Sepsis- fever on admission, tachypnea (due to CHF likely), leukocytosis  - source uncertain- cont antibiotics for right groin / graft infection- Dapto and Rifampin - fever, leukocytosis and tachypnea have all resolved    Acute encephalopathy - cont to follow- cause uncertain - check CO2 level with ABG, check ammonia    Essential hypertension - d/c Bidil- start higher dose of Hydralazine and Imdur at 30 mg,  - add Norvasc - cont Metoprolol -  added Clonidine on 2/10    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 - cont Levemir and sliding scale insulin    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 - BUN rising but Cr improved today with diuresis- follow  Normocytic anemia: - hemoglobin has remained around 7-8 last several admissions - given 2 U PRBC as Hg was 7.2 (2/7), appropriate increase in Hg post transfusion  Morbid obesity, Body mass index is 33.38   Code Status: full code Family Communication:  Disposition Plan:  will go to SNF DVT prophylaxis: SCDs  Consultants: Vas sx  Procedures:   Antibiotics: Anti-infectives    Start     Dose/Rate Route Frequency Ordered Stop   03/23/14 2200  DAPTOmycin (CUBICIN) 400 mg in sodium chloride 0.9 % IVPB     400 mg 216 mL/hr over 30 Minutes Intravenous Every 48 hours 04/08/2014 2106     04/01/2014 2200  rifampin (RIFADIN) capsule 300 mg     300 mg Oral Every 12 hours 03/22/2014 2007     03/20/2014 2100  DAPTOmycin (CUBICIN) 600 mg in sodium chloride 0.9 % IVPB  Status:  Discontinued     600 mg 224 mL/hr over 30 Minutes Intravenous Every 48 hours 03/30/2014 2007 03/22/14 1106   04/11/2014 2100  levofloxacin (LEVAQUIN) tablet 500 mg  Status:  Discontinued     500 mg Oral Every 48 hours 03/29/2014 2007 03/23/14 1236         Objective: Filed Weights   03/24/14 0500 03/25/14 0642 03/26/14 0533  Weight: 92.035  kg (202 lb 14.4 oz) 94.3 kg (207 lb 14.3 oz) 92.897 kg (204 lb 12.8 oz)    Intake/Output Summary (Last 24 hours) at 03/26/14 0816 Last data filed at 03/26/14 0747  Gross per 24 hour  Intake      0 ml  Output    500 ml  Net   -500 ml     Vitals Filed Vitals:   03/25/14 0642 03/25/14 1428 03/25/14 2014 03/26/14 0533  BP: 149/84 110/47 155/78 165/97  Pulse: 68 73 72 70  Temp: 98.6 F (37 C)  97.7 F (36.5 C) 97.5 F (36.4 C)  TempSrc: Oral  Oral Oral  Resp: Height:      Weight: 94.3 kg (207 lb 14.3 oz)   92.897 kg (204 lb  12.8 oz)  SpO2: 97% 90% 97% 99%    Exam: General: Awake and alert, confused, No acute respiratory distress Lungs: CTA b/l  Cardiovascular: Regular rate and rhythm without murmur gallop or rub normal S1 and S2 Abdomen: Nontender, nondistended, soft, bowel sounds positive, no rebound, no ascites, no appreciable mass Extremities: No significant cyanosis, clubbing, +2 edema bilateral lower extremities  Data Reviewed: Basic Metabolic Panel:  Recent Labs Lab 03/22/14 0352 03/23/14 0440 03/24/14 0507 03/25/14 0430 03/26/14 0505  NA 138 137 131* 137 137  K 4.0 4.4 4.5 5.2* 5.6*  CL 104 102 98 100 101  CO2 GLUCOSE 206* 140* 123* 155* 122*  BUN 69* 72* 72* 84* 96*  CREATININE 2.82* 2.96* 2.73* 2.88* 3.21*  CALCIUM 8.1* 8.2* 8.1* 8.6 8.5   Liver Function Tests: No results for input(s): AST, ALT, ALKPHOS, BILITOT, PROT, ALBUMIN in the last 168 hours. No results for input(s): LIPASE, AMYLASE in the last 168 hours. No results for input(s): AMMONIA in the last 168 hours. CBC:  Recent Labs Lab 03/22/14 0352 03/23/14 0440 03/24/14 0507 03/25/14 0430 03/26/14 0505  WBC 10.8* 11.0* 12.9* 12.2* 10.3  HGB 7.4* 7.2* 10.4* 10.0* 10.1*  HCT 24.5* 23.6* 32.9* 32.6* 32.4*  MCV 83.6 83.1 85.5 86.0 85.5  PLT 344 324 362 369 352   Cardiac Enzymes:  Recent Labs Lab 03/28/2014 1542 03/22/14 0352  CKTOTAL  --  90  TROPONINI 0.15*  --    BNP (last 3 results)  Recent Labs  02/17/14 1030 03/11/14 2225 03/17/2014 1610  BNP 1410.9* 2715.9* 2224.3*    ProBNP (last 3 results) No results for input(s): PROBNP in the last 8760 hours.  CBG:  Recent Labs Lab 03/25/14 0800 03/25/14 1234 03/25/14 1619 03/25/14 2121 03/26/14 0745  GLUCAP 129* 159* 119* 133* 105*    Recent Results (from the past 240 hour(s))  MRSA PCR Screening     Status: None   Collection Time: 03/28/2014  8:30 PM  Result Value Ref Range Status   MRSA by PCR NEGATIVE NEGATIVE Final    Comment:         The GeneXpert MRSA Assay (FDA approved for NASAL specimens only), is one component of a comprehensive MRSA colonization surveillance program. It is not intended to diagnose MRSA infection nor to guide or monitor treatment for MRSA infections.   Culture, blood (routine x 2)     Status: None (Preliminary result)   Collection Time: 03/22/14 12:06 PM  Result Value Ref Range Status   Specimen Description BLOOD RIGHT ARM  10 ML IN [facility] BOTTLE  Final   Special Requests NONE  Final   Culture   Final  BLOOD CULTURE RECEIVED NO GROWTH TO DATE CULTURE WILL BE HELD FOR 5 DAYS BEFORE ISSUING A FINAL NEGATIVE REPORT [facility]formed [facility]    Report Status PENDING  Incomplete  Urine culture     Status: None   Collection Time: 03/22/14  3:24 PM  Result Value Ref Range Status   Specimen Description URINE, CATHETERIZED  Final   Special Requests NONE  Final   Colony Count NO GROWTH [facility]formed at [facility]   Final   Culture NO GROWTH Performed at [facility]   Final   Report Status 03/24/2014 FINAL  Final  Culture, blood (routine x 2)     Status: None (Preliminary result)   Collection Time: 03/22/14  3:30 PM  Result Value Ref Range Status   Specimen Description BLOOD RIGHT HAND  Final   Special Requests BOTTLES DRAWN AEROBIC AND ANAEROBIC 5CC EACH  Final   Culture   Final           BLOOD CULTURE RECEIVED NO GROWTH TO DATE CULTURE WILL BE HELD FOR 5 DAYS BEFORE ISSUING A FINAL NEGATIVE REPORT Performed at [facility]    Report Status PENDING  Incomplete     Studies:  Recent x-ray studies have been reviewed in detail by the Attending Physician  Scheduled Meds:  Scheduled Meds: . antiseptic oral rinse  7 mL Mouth Rinse BID  . aspirin EC  81 mg Oral Daily  . atorvastatin  80 mg Oral q1800  . DAPTOmycin (CUBICIN)  IV  400 mg Intravenous Q48H  . feeding supplement (PRO-STAT SUGAR FREE 64)  30 mL Oral Daily  . furosemide  40 mg  Intravenous BID  . insulin aspart  0-20 Units Subcutaneous TID WC  . insulin aspart  0-5 Units Subcutaneous QHS  . insulin detemir  20 Units Subcutaneous QHS  . isosorbide-hydrALAZINE  1 tablet Oral BID  . metoprolol tartrate  50 mg Oral BID  . polyethylene glycol  17 g Oral Daily  . protein supplement  1 scoop Oral BID WC  . rifampin  300 mg Oral Q12H  . sodium chloride  3 mL Intravenous Q12H  . vitamin C  500 mg Oral BID   Continuous Infusions:   Time spent on care of this patient: 35 min   Hazelyn Kallen, MD 03/26/2014, 8:16 AM  LOS: 5 days   [facility] Office  [phone] Pager - Text Page per [email or web address]  If 7PM-7AM, please contact night-coverage [email or web address]

## 2014-03-26 NOTE — Evaluation (Signed)
Occupational Therapy Evaluation Patient Details Name: [name] MRN: [id number] DOB: 03/22/1950 Today's Date: 03/26/2014    History of Present Illness Pt is a 64 y.o. 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    Clinical Impression   No family present to determine cognitive baseline. Pt at times a little agitated during session and he abruptly pulled covers back over himself when OT requesting to look at this armband. Pt with BM either from bed level or when he sat up with OT and pt was not aware of this. He also did not inform OT that he needed to void and use urinal (catheter came off during session) even when OT explained there was a urinal available. Assisted with change of bed linens and pt requesting to lie down and "get warm." Will follow on acute to progress ADL. No family to report ADL baseline either.    Follow Up Recommendations  SNF;Supervision/Assistance - 24 hour    Equipment Recommendations       Recommendations for Other Services       Precautions / Restrictions Precautions Precautions: Fall      Mobility Bed Mobility Overal bed mobility: Needs Assistance       Supine to sit: Mod assist Sit to supine: Min assist   General bed mobility comments: decresed initiation. verbal cues required throughout.   Transfers Overall transfer level: Needs assistance Equipment used:  (pt refused walker) Transfers: Sit to/from Stand Sit to Stand: +2  safety/equipment;Min assist         General transfer comment: +2 for safety as pt confused and unpredictable at times    Balance                                            ADL Overall ADL's : Needs assistance/impaired Eating/Feeding: Independent;Bed level   Grooming: Wash/dry hands;Set up;Bed level   Upper Body Bathing: Minimal assitance;Sitting   Lower Body Bathing: Sit to/from stand;Maximal assistance;+2 for physical assistance   Upper Body Dressing : Minimal assistance;Sitting   Lower Body Dressing: Total assistance;Sit to/from stand;+2 for physical assistance Lower Body Dressing Details (indicate cue type and reason): min assist for sit to stand. second person to perform hygiene as pt requring support of OT to stand. pt refused walker.  Toilet Transfer: Minimal assistance;+2 for safety/equipment Toilet Transfer Details (indicate cue type and reason): sit to stand only. pt refused walker. stood to have periarea cleaned after BM sitting EOB. Toileting- Clothing Manipulation and Hygiene: Total assistance;Sit to/from stand;+2 for physical assistance         General ADL Comments: Pt not aware that he had had a BM and when OT assisted pt to stand to straighten pads, noticed BM on bed pad. Pts catheter came off during time sitting EOB and pt starting to urinate at EOB. Brought urinal to pt but pt requiring assist with placement of urinal. Demonstrated decreased initiation with tasks. Later pt  voiding at EOB into the floor and did not indicate the need for urinal even though he had attempted to use it earlier. No family present to confirm baseline function. pt stating he performed all ADL but with difficulty. NOt sure of accuracy of all info. pt at times agitated and pulling covers up over himself abruptly when therapist asked to look at his armband at start of session.       Vision     Perception     Praxis      Pertinent Vitals/Pain Pain Assessment:  0-10 Pain Score: 3  Pain Location: R leg Pain Descriptors / Indicators: Sore Pain Intervention(s): Repositioned     Hand Dominance     Extremity/Trunk Assessment             Communication Communication Communication: Other (comment) (pt mumbles. difficult to understand at times)   Cognition Arousal/Alertness: Awake/alert Behavior During Therapy: Agitated (agitated at times during session) Overall Cognitive Status: No family/caregiver present to determine baseline cognitive functioning Area of Impairment: Memory;Safety/judgement;Awareness Orientation Level: Place;Situation;Time   Memory: Decreased short-term memory Following Commands: Follows one step commands with increased time     Problem Solving: Requires verbal cues;Requires tactile cues     General Comments       Exercises       Shoulder Instructions      Home Living Family/patient expects to be discharged to:: Skilled nursing facility                                 Additional Comments: pt from [facility] and plans to return      Prior Functioning/Environment      ADL's / Homemaking Assistance Needed: unsure of exact level of function--pt vague about how he completed ADL at SNF. He states he did not use a walker but per chart from previous admission, wife reporting he was starting to use a walker.         OT Diagnosis: Generalized weakness   OT Problem List: Decreased strength;Decreased knowledge of use of DME or AE;Decreased cognition   OT Treatment/Interventions: Self-care/ADL training;Patient/family education;DME and/or AE instruction;Therapeutic activities;Cognitive remediation/compensation    OT Goals(Current goals can be found in the care plan section) Acute Rehab OT Goals Patient Stated Goal: None stated OT Goal Formulation: With patient Time For Goal Achievement: 04/09/14 Potential to Achieve Goals: Good  OT Frequency: Min 2X/week   Barriers to D/C:             Co-evaluation              End of Session Equipment Utilized During Treatment: Gait belt  Activity Tolerance: Patient tolerated treatment well Patient left: in bed;with call bell/phone within reach;with bed alarm set   Time: 1200-1245 OT Time Calculation (min): 45 min Charges:  OT General Charges $OT Visit: 1 Procedure OT Evaluation $Initial OT Evaluation Tier I: 1 Procedure OT Treatments $Self Care/Home Management : 8-22 mins $Therapeutic Activity: 8-22 mins G-Codes:    Lennox Laity  [phone] 03/26/2014, 1:11 PM

## 2014-03-26 NOTE — Progress Notes (Signed)
PT Cancellation Note  Patient Details Name: [name] MRN: [id number] DOB: 04/23/1950   Cancelled Treatment:    Reason Eval/Treat Not Completed: Patient declined, no reason specified  Pt refusing mobility at this time.  Pt nonsensical at times talking about BBQ and when asked if he knew where he was going upon d/c he stated [place].  Pt appears confused and unwilling to participate at this time.  Will check back as schedule permits.   Tyreece Gelles,KATHrine E 03/26/2014, 1:36 PM Zenovia JarredKati Raylee Strehl, PT, DPT 03/26/2014 Pager: [phone]

## 2014-03-27 ENCOUNTER — Encounter: Payer: Medicare HMO | Admitting: Adult Health

## 2014-03-27 DIAGNOSIS — I472 Ventricular tachycardia: Secondary | ICD-10-CM

## 2014-03-27 DIAGNOSIS — Z9981 Dependence on supplemental oxygen: Secondary | ICD-10-CM

## 2014-03-27 LAB — BLOOD GAS, ARTERIAL
Acid-Base Excess: 0.2 mmol/L (ref 0.0–2.0)
Bicarbonate: 25.5 mEq/L — ABNORMAL HIGH (ref 20.0–24.0)
DRAWN BY: 42253
O2 Saturation: 80.1 %
PATIENT TEMPERATURE: 98.2
PO2 ART: 48.2 mmHg — AB (ref 80.0–100.0)
TCO2: 23.9 mmol/L (ref 0–100)
pCO2 arterial: 46.8 mmHg — ABNORMAL HIGH (ref 35.0–45.0)
pH, Arterial: 7.355 (ref 7.350–7.450)

## 2014-03-27 LAB — GLUCOSE, CAPILLARY
GLUCOSE-CAPILLARY: 123 mg/dL — AB (ref 70–99)
GLUCOSE-CAPILLARY: 82 mg/dL (ref 70–99)
Glucose-Capillary: 84 mg/dL (ref 70–99)
Glucose-Capillary: 61 mg/dL — ABNORMAL LOW (ref 70–99)
Glucose-Capillary: 162 mg/dL — ABNORMAL HIGH (ref 70–99)

## 2014-03-27 LAB — BASIC METABOLIC PANEL
Anion gap: 10 (ref 5–15)
BUN: 100 mg/dL — AB (ref 6–23)
CHLORIDE: 103 mmol/L (ref 96–112)
CO2: 25 mmol/L (ref 19–32)
CREATININE: 2.86 mg/dL — AB (ref 0.50–1.35)
Calcium: 8.4 mg/dL (ref 8.4–10.5)
GFR calc Af Amer: 25 mL/min — ABNORMAL LOW (ref >90)
GFR calc non Af Amer: 22 mL/min — ABNORMAL LOW (ref >90)
GLUCOSE: 171 mg/dL — AB (ref 70–99)
Potassium: 4.9 mmol/L (ref 3.5–5.1)
Sodium: 138 mmol/L (ref 135–145)

## 2014-03-27 LAB — MAGNESIUM: Magnesium: 2.3 mg/dL (ref 1.5–2.5)

## 2014-03-27 LAB — AMMONIA: Ammonia: 39 umol/L — ABNORMAL HIGH (ref 11–32)

## 2014-03-27 MED ORDER — ISOSORBIDE MONONITRATE ER 30 MG PO TB24
30.0000 mg | ORAL_TABLET | Freq: Every day | ORAL | Status: DC
  Administered 2014-03-27 – 2014-03-29 (×3): 30 mg via ORAL
  Filled 2014-03-27 (×4): qty 1

## 2014-03-27 MED ORDER — HYDRALAZINE HCL 50 MG PO TABS
100.0000 mg | ORAL_TABLET | Freq: Three times a day (TID) | ORAL | Status: DC
  Administered 2014-03-27 – 2014-03-31 (×11): 100 mg via ORAL
  Filled 2014-03-27 (×2): qty 2
  Filled 2014-03-27: qty 4
  Filled 2014-03-27: qty 10
  Filled 2014-03-27 (×3): qty 2
  Filled 2014-03-27: qty 4
  Filled 2014-03-27: qty 2
  Filled 2014-03-27 (×2): qty 4
  Filled 2014-03-27: qty 10
  Filled 2014-03-27 (×4): qty 2
  Filled 2014-03-27 (×2): qty 4
  Filled 2014-03-27 (×2): qty 2
  Filled 2014-03-27 (×2): qty 4
  Filled 2014-03-27: qty 10
  Filled 2014-03-27: qty 4
  Filled 2014-03-27 (×2): qty 2
  Filled 2014-03-27: qty 10

## 2014-03-27 MED ORDER — AMLODIPINE BESYLATE 10 MG PO TABS
10.0000 mg | ORAL_TABLET | Freq: Every day | ORAL | Status: DC
  Administered 2014-03-27 – 2014-03-29 (×3): 10 mg via ORAL
  Filled 2014-03-27 (×4): qty 1

## 2014-03-27 MED ORDER — CLONIDINE HCL 0.2 MG/24HR TD PTWK
0.2000 mg | MEDICATED_PATCH | TRANSDERMAL | Status: DC
  Filled 2014-03-27: qty 1

## 2014-03-27 MED ORDER — ZOLPIDEM TARTRATE 5 MG PO TABS
5.0000 mg | ORAL_TABLET | Freq: Once | ORAL | Status: AC
  Administered 2014-03-27: 5 mg via ORAL
  Filled 2014-03-27: qty 1

## 2014-03-27 NOTE — Progress Notes (Signed)
CRITICAL VALUE ALERT  Critical value received:  Glucose   Date of notification:  03/27/18  Time of notification:  1700  Critical value read back:Yes.    Nurse who received alert:  Debroah LoopKatie Caiden Arteaga   MD notified (1st page):  Rizwan  Time of first page:  1739  MD notified (2nd page):  Time of second page:  Responding MD:  Butler Denmarkizwan  Time MD responded:

## 2014-03-27 NOTE — Progress Notes (Signed)
Echocardiogram 2D Echocardiogram has been performed.  [name] 03/27/2014, 3:23 PM

## 2014-03-27 NOTE — Evaluation (Signed)
Physical Therapy Evaluation Patient Details Name: [name] MRN: [id number] DOB: 11/23/1950 Today's Date: 03/27/2014   History of Present Illness  Pt is a 64 y.o. 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   Clinical Impression  On eval, pt required Mod assist for mobility-able to stand and pivot from bed to recliner with RW. Mod encouragement for participation. Unsteady and at risk for falls even with minimal mobilization. Attempted ambulation but pt refused. Recommend SNF.     Follow Up Recommendations SNF;Supervision/Assistance - 24 hour    Equipment Recommendations  None recommended by PT    Recommendations for Other Services       Precautions / Restrictions Precautions Precautions: Fall Restrictions Weight Bearing Restrictions: No      Mobility  Bed Mobility Overal bed mobility: Needs Assistance Bed Mobility: Supine to Sit     Supine to sit: Min assist     General bed mobility comments: decresed initiation. verbal cues required throughout. Assist for trunks and LEs. Increased time.  Transfers Overall transfer level: Needs assistance Equipment used: Rolling walker (2 wheeled) Transfers: Sit to/from Stand Sit to Stand: Mod assist Stand pivot transfers: Min assist       General transfer comment: Assist to rise, stabilize, control descent. Stand pivot bed>recliner with RW.   Ambulation/Gait             General Gait Details: NT-pt refused to  walk  Stairs            Wheelchair Mobility    Modified Rankin (Stroke Patients Only)       Balance           Standing balance support: Bilateral upper extremity supported;During functional activity Standing balance-Leahy Scale: Poor                               Pertinent Vitals/Pain Pain Assessment: No/denies pain    Home Living Family/patient expects to be discharged to:: Skilled nursing facility                      Prior Function           Comments: no family present to provide PLOF/home environment info     Hand Dominance        Extremity/Trunk Assessment   Upper Extremity Assessment: Defer to OT evaluation           Lower Extremity Assessment: Generalized weakness      Cervical / Trunk Assessment: Kyphotic  Communication      Cognition Arousal/Alertness: Awake/alert       Orientation Level: Disoriented to;Place;Time;Situation Current Attention Level: Focused   Following Commands: Follows one step commands with increased time     Problem Solving: Requires verbal cues;Requires tactile cues;Decreased initiation      General Comments      Exercises        Assessment/Plan    PT Assessment Patient needs continued PT services  PT Diagnosis Difficulty walking;Generalized weakness;Altered  mental status   PT Problem List Decreased strength;Decreased activity tolerance;Decreased balance;Decreased mobility;Decreased knowledge of use of DME;Decreased safety awareness;Decreased cognition  PT Treatment Interventions DME instruction;Gait training;Functional mobility training;Therapeutic activities;Therapeutic exercise;Patient/family education;Balance training   PT Goals (Current goals can be found in the Care Plan section) Acute Rehab PT Goals Patient Stated Goal: None stated PT Goal Formulation: Patient unable to participate in goal setting Time For Goal Achievement: 04/03/14 Potential to Achieve Goals:  Fair    Frequency Min 3X/week   Barriers to discharge        Co-evaluation               End of Session Equipment Utilized During Treatment: Gait belt Activity Tolerance: Patient tolerated treatment well Patient left: in chair;with call bell/phone within reach;with chair alarm set           Time: [id number] PT Time Calculation (min) (ACUTE ONLY): 15 min   Charges:   PT Evaluation $Initial PT Evaluation Tier I: 1 Procedure     PT G Codes:        Rebeca AlertJannie Aniesha Haughn, MPT Pager: [phone]

## 2014-03-27 NOTE — Progress Notes (Signed)
ABG drawn on room air.  Placed back on patients oxygen at 2lpm after draw.   RR, + Allens test, site held for 5 mintues

## 2014-03-28 ENCOUNTER — Inpatient Hospital Stay (HOSPITAL_COMMUNITY): Payer: Medicare HMO

## 2014-03-28 DIAGNOSIS — N184 Chronic kidney disease, stage 4 (severe): Secondary | ICD-10-CM

## 2014-03-28 LAB — GLUCOSE, CAPILLARY
GLUCOSE-CAPILLARY: 95 mg/dL (ref 70–99)
Glucose-Capillary: 86 mg/dL (ref 70–99)
Glucose-Capillary: 103 mg/dL — ABNORMAL HIGH (ref 70–99)
Glucose-Capillary: 77 mg/dL (ref 70–99)

## 2014-03-28 LAB — BASIC METABOLIC PANEL
ANION GAP: 10 (ref 5–15)
BUN: 88 mg/dL — ABNORMAL HIGH (ref 6–23)
CO2: 29 mmol/L (ref 19–32)
CREATININE: 2.54 mg/dL — AB (ref 0.50–1.35)
Calcium: 8.6 mg/dL (ref 8.4–10.5)
Chloride: 103 mmol/L (ref 96–112)
GFR calc Af Amer: 29 mL/min — ABNORMAL LOW (ref >90)
GFR calc non Af Amer: 25 mL/min — ABNORMAL LOW (ref >90)
Glucose, Bld: 99 mg/dL (ref 70–99)
Potassium: 4.6 mmol/L (ref 3.5–5.1)
Sodium: 142 mmol/L (ref 135–145)

## 2014-03-28 LAB — CULTURE, BLOOD (ROUTINE X 2): Culture: NO GROWTH

## 2014-03-28 MED ORDER — FUROSEMIDE 10 MG/ML IJ SOLN
80.0000 mg | Freq: Two times a day (BID) | INTRAMUSCULAR | Status: DC
  Administered 2014-03-28 – 2014-03-29 (×2): 80 mg via INTRAVENOUS
  Filled 2014-03-28 (×2): qty 8

## 2014-03-28 MED ORDER — FUROSEMIDE 10 MG/ML IJ SOLN: 80.0000 mg | Freq: Two times a day (BID) | INTRAMUSCULAR | Status: DC

## 2014-03-28 MED ORDER — FUROSEMIDE 40 MG PO TABS
80.0000 mg | ORAL_TABLET | Freq: Three times a day (TID) | ORAL | Status: DC
  Administered 2014-03-28: 80 mg via ORAL
  Filled 2014-03-28: qty 2

## 2014-03-28 MED ORDER — SODIUM CHLORIDE 0.9 % IJ SOLN
10.0000 mL | INTRAMUSCULAR | Status: DC | PRN
  Administered 2014-03-29: 10 mL
  Filled 2014-03-28: qty 40

## 2014-03-28 NOTE — Progress Notes (Signed)
[facility] Progress Note   [name] ZOX:[id number] DOB: Aug 16, 1950 DOA: 03/26/2014 PCP: Milinda Antis, MD  Brief narrative: [name] is a 64 y.o. 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  He comes in for shortness of breath and hypoxia. For the last 2 days he has not been able to lay flat to sleep and can not walk to the bathroom without getting short of breath. CXR reveals pulm edema. Has chronic pedal edema that was unchanged.  Found to have a fever in the ER as well. Temp was 101 on 2/6.   Subjective: Alert, no complaints.   Assessment/Plan: Principal Problem:   Acute respiratory failure with hypoxia- acute on chronic diastolic CHF- mild troponin elevation without chest pain - suspect acute diastolic CHF- started on lasix infusion, transitioned to BID Lasix - repeat CXR shows continued edema- increased Lasix to TID - obtain strict control of BP  - repeat ECHO - mod LVH with elevated LV filling pressure- cont to give Lasix- has pulled PICC- give oral Lasix until PICC replaced  Active Problems: Sepsis- fever on admission, tachypnea (due to CHF likely), leukocytosis  - source uncertain- cont antibiotics for right groin / graft infection- Dapto and Rifampin - fever, leukocytosis and tachypnea have all resolved    Acute encephalopathy - cont to follow- cause uncertain - check CO2 level with ABG, check ammonia    Essential hypertension - d/c Bidil- start higher dose of Hydralazine and Imdur at 30 mg,  - add  Norvasc - cont Metoprolol - added Clonidine on 2/10    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 - cont Levemir and sliding scale insulin    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 - BUN rising but Cr improved today with diuresis- follow  Normocytic anemia: - hemoglobin has remained around 7-8 last several admissions - given 2 U PRBC as Hg was 7.2 (2/7), appropriate increase in Hg post transfusion  Morbid obesity, Body mass index is 33.38   Code Status: full code Family Communication:  Disposition Plan:  will go to SNF DVT prophylaxis: SCDs  Consultants: Vas sx  Procedures:   Antibiotics: Anti-infectives    Start     Dose/Rate Route Frequency Ordered Stop   03/23/14 2200  DAPTOmycin (CUBICIN) 400 mg in sodium chloride 0.9 % IVPB     400 mg 216 mL/hr over 30 Minutes Intravenous Every 48 hours 03/27/2014 2106     04/06/2014 2200  rifampin (RIFADIN) capsule 300 mg     300 mg Oral Every 12 hours 03/23/2014 2007     04/09/2014 2100  DAPTOmycin (CUBICIN) 600 mg in sodium chloride 0.9 % IVPB  Status:  Discontinued     600 mg 224 mL/hr over 30 Minutes Intravenous Every 48 hours 04/03/2014 2007 03/22/14 1106   04/13/2014 2100  levofloxacin (LEVAQUIN) tablet 500 mg  Status:  Discontinued     500 mg Oral Every 48 hours 03/20/2014 2007 03/23/14 1236     Objective: Filed Weights   03/26/14 0533 03/27/14 0459 03/28/14 0417  Weight: 92.897  kg (204 lb 12.8 oz) 90.719 kg (200 lb) 88.4 kg (194 lb 14.2 oz)    Intake/Output Summary (Last 24 hours) at 03/28/14 1414 Last data filed at 03/28/14 1210  Gross per 24 hour  Intake    480 ml  Output   1700 ml  Net  -1220 ml     Vitals Filed Vitals:   03/28/14 1023 03/28/14 1025 03/28/14 1100 03/28/14 1102  BP:      Pulse:      Temp:      TempSrc:      Resp:      Height:      Weight:      SpO2: 80% 92% 70% 95%    Exam: General: Awake and alert, confused, No acute respiratory distress Lungs: CTA b/l   Cardiovascular: Regular rate and rhythm without murmur gallop or rub normal S1 and S2 Abdomen: Nontender, nondistended, soft, bowel sounds positive, no rebound, no ascites, no appreciable mass Extremities: No significant cyanosis, clubbing, +2 edema bilateral lower extremities  Data Reviewed: Basic Metabolic Panel:  Recent Labs Lab 03/24/14 0507 03/25/14 0430 03/26/14 0505 03/27/14 0421 03/28/14 0455  NA 131* 137 137 138 142  K 4.5 5.2* 5.6* 4.9 4.6  CL 98 100 101 103 103  CO2 GLUCOSE 123* 155* 122* 171* 99  BUN 72* 84* 96* 100* 88*  CREATININE 2.73* 2.88* 3.21* 2.86* 2.54*  CALCIUM 8.1* 8.6 8.5 8.4 8.6  MG  --   --   --  2.3  --    Liver Function Tests: No results for input(s): AST, ALT, ALKPHOS, BILITOT, PROT, ALBUMIN in the last 168 hours. No results for input(s): LIPASE, AMYLASE in the last 168 hours.  Recent Labs Lab 03/27/14 1125  AMMONIA 39*   CBC:  Recent Labs Lab 03/22/14 0352 03/23/14 0440 03/24/14 0507 03/25/14 0430 03/26/14 0505  WBC 10.8* 11.0* 12.9* 12.2* 10.3  HGB 7.4* 7.2* 10.4* 10.0* 10.1*  HCT 24.5* 23.6* 32.9* 32.6* 32.4*  MCV 83.6 83.1 85.5 86.0 85.5  PLT 344 324 362 369 352   Cardiac Enzymes:  Recent Labs Lab 03/24/2014 1542 03/22/14 0352  CKTOTAL  --  90  TROPONINI 0.15*  --    BNP (last 3 results)  Recent Labs  02/17/14 1030 03/11/14 2225 03/23/2014 1610  BNP 1410.9* 2715.9* 2224.3*    ProBNP (last 3 results) No results for input(s): PROBNP in the last 8760 hours.  CBG:  Recent Labs Lab 03/27/14 1654 03/27/14 1718 03/27/14 2128 03/28/14 0744 03/28/14 1153  GLUCAP 61* 82 162* 86 103*    Recent Results (from the past 240 hour(s))  MRSA PCR Screening     Status: None   Collection Time: 04/12/2014  8:30 PM  Result Value Ref Range Status   MRSA by PCR NEGATIVE NEGATIVE Final    Comment:        The GeneXpert MRSA Assay (FDA approved for NASAL specimens only), is one component of a comprehensive  MRSA colonization surveillance program. It is not intended to diagnose MRSA infection nor to guide or monitor treatment for MRSA infections.   Culture, blood (routine x 2)     Status: None   Collection Time: 03/22/14 12:06 PM  Result Value Ref Range Status   Specimen Description BLOOD RIGHT ARM  10 ML IN [facility] BOTTLE  Final   Special Requests NONE  Final   Culture   Final    NO GROWTH 5 DAYS Performed at [facility]    Report Status 03/28/2014 FINAL  Final  Urine culture     Status: None   Collection Time: 03/22/14  3:24 PM  Result Value Ref Range Status   Specimen Description URINE, CATHETERIZED  Final   Special Requests NONE  Final   Colony Count NO GROWTH [facility]formed at [facility]   Final   Culture NO GROWTH Performed at [facility]   Final   Report Status 03/24/2014 FINAL  Final  Culture, blood (routine x 2)     Status: None (Preliminary result)   Collection Time: 03/22/14  3:30 PM  Result Value Ref Range Status   Specimen Description BLOOD RIGHT HAND  Final   Special Requests BOTTLES DRAWN AEROBIC AND ANAEROBIC 5CC EACH  Final   Culture   Final           BLOOD CULTURE RECEIVED NO GROWTH TO DATE CULTURE WILL BE HELD FOR 5 DAYS BEFORE ISSUING A FINAL NEGATIVE REPORT Performed at [facility]    Report Status PENDING  Incomplete     Studies:  Recent x-ray studies have been reviewed in detail by the Attending Physician  Scheduled Meds:  Scheduled Meds: . amLODipine  10 mg Oral Daily  . antiseptic oral rinse  7 mL Mouth Rinse BID  . aspirin EC  81 mg Oral Daily  . atorvastatin  80 mg Oral q1800  . DAPTOmycin (CUBICIN)  IV  400 mg Intravenous Q48H  . feeding supplement (PRO-STAT SUGAR FREE 64)  30 mL Oral Daily  . furosemide  80 mg Oral TID  . hydrALAZINE  100 mg Oral 3 times per day  . insulin aspart  0-20 Units Subcutaneous TID WC  . insulin aspart  0-5 Units Subcutaneous QHS  . insulin detemir  20 Units Subcutaneous QHS  .  isosorbide mononitrate  30 mg Oral Daily  . metoprolol tartrate  50 mg Oral BID  . polyethylene glycol  17 g Oral Daily  . protein supplement  1 scoop Oral BID WC  . rifampin  300 mg Oral Q12H  . sodium chloride  3 mL Intravenous Q12H  . vitamin C  500 mg Oral BID   Continuous Infusions:   Time spent on care of this patient: 35 min   Collin Willadsen, MD 03/28/2014, 2:14 PM  LOS: 7 days   [facility] Office  [phone] Pager - Text Page per [email or web address]  If 7PM-7AM, please contact night-coverage [email or web address]

## 2014-03-28 NOTE — Progress Notes (Signed)
Inpatient Diabetes Program Recommendations  AACE/ADA: New Consensus Statement on Inpatient Glycemic Control (2013)  Target Ranges:  Prepandial:   less than 140 mg/dL      Peak postprandial:   less than 180 mg/dL (1-2 hours)      Critically ill patients:  140 - 180 mg/dL     Results for [name], [name] (MRN [id number]) as of 03/28/2014 08:58  Ref. Range 03/27/2014 08:03 03/27/2014 11:25 03/27/2014 16:54 03/27/2014 17:18 03/27/2014 21:28  Glucose-Capillary Latest Range: 70-99 mg/dL 409123 (H) 84 61 (L) 82 811162 (H)    Results for [name], [name] (MRN [id number]) as of 03/28/2014 08:58  Ref. Range 03/28/2014 07:44  Glucose-Capillary Latest Range: 70-99 mg/dL 86     Home DM Meds: Levemir 16 units QHS       Novolog SSI   Current Orders: Levemir 20 units QHS      Novolog Resistant SSI tid ac + HS    **Patient with Hypoglycemia yesterday at supper time.  **Note patient's creatinine level elevated and patient has History of CKD.    MD- Please consider the following:   1) Reduce Levemir to 16 units QHS (home dose) 2) Reduce Novolog SSI to MODERATE scale (currently ordered as RESISTANT scale)    Will follow Ambrose FinlandJeannine Johnston Lorcan Shelp RN, MSN, CDE Diabetes Coordinator Inpatient Diabetes Program Team Pager: [phone] (8a-10p)

## 2014-03-28 NOTE — Consult Note (Signed)
CARDIOLOGY CONSULT NOTE   Patient ID: [name] MRN: [id number], DOB/AGE: 11/09/1950   Admit date: 03/22/2014 Date of Consult: 03/28/2014   Primary Physician: Milinda Antis, MD Primary Cardiologist: Dr. Ladona Ridgel  Pt. Profile  64 yo AA male with PMH of COPD, OSA, HTN, HLD, DM, PVD, SSS s/p PPM 1996 with device change out 2010 and CAD s/p 3v CABG in 2003 who came in with SOB for several weeks after recent vascular procedure for infected PVD grafts  Problem List  Past Medical History  Diagnosis Date  . Arteriosclerotic cardiovascular disease (ASCVD)     CABG in 2003 for 3 VD; 07/2007: Stress nuclear-inferior infarction without ischemia; normal EF by echo in 06/2005  . Diabetes mellitus type II   . Hyperlipidemia   . Degenerative joint disease     Left knee  . Cerebrovascular disease 2003    Carotid ultrasound in 2010: Patent right CEA site, 60-79% left internal carotid artery  . Peripheral vascular disease 2007    aortobifemoral BPG-2007  . Anemia   . Peripheral neuropathy   . Hypertension   . Sick sinus syndrome 1996    permanent pacemaker-1996; generator change in 2010  . Chronic obstructive pulmonary disease   . Obesity   . Obstructive sleep apnea   . Benign prostatic hypertrophy   . Gastroesophageal reflux disease   . Tobacco abuse, in remission     50 pack years total consumption; Quit in 2007    Past Surgical History  Procedure Laterality Date  . Coronary artery bypass graft  2003    three-vessel disease  . Iliac artery stent      Percutaneous intervention-right external iliac; unclear whether stent was placed  . Pacemaker insertion  1996    [employer] Jude Accent DR dual-chamber device, serial number [id number]   . Carotid endarterectomy  2003    Right  . Pacemaker generator change  2010     St. Jude Accent DR dual-chamber device, serial number [id number]   . Aorta - bilateral femoral artery bypass graft  2007  . Femoral-popliteal bypass graft  1998    bilateral    . Colonoscopy  2012    Negative screening study.  . Ileocolonoscopy  02/20/2007    WUJ:WJXBJY rectum, colon, and terminal ileum  . Femoral-femoral bypass graft Bilateral 01/03/2014    Procedure: BYPASS GRAFT RIGHT FEMORAL-LEFT FEMORAL ARTERY ;  Surgeon: Chuck Hint, MD;  Location: [facility] OR;  Service: Vascular;  Laterality: Bilateral;  . Endarterectomy femoral Left 01/03/2014    Procedure: ENDARTERECTOMY FEMORAL;  Surgeon: Chuck Hint, MD;  Location: [facility] OR;  Service: Vascular;  Laterality: Left;  . Intraoperative arteriogram Left 01/03/2014    Procedure: INTRA OPERATIVE ARTERIOGRAM, LEFT LOWER LEG;  Surgeon: Chuck Hint, MD;  Location: [facility] OR;  Service: Vascular;  Laterality: Left;  . Abdominal angiogram N/A 01/02/2014    Procedure: ABDOMINAL ANGIOGRAM;  Surgeon: Fransisco Hertz, MD;  Location: [facility] CATH LAB;  Service: Cardiovascular;  Laterality: N/A;  . Peripheral vascular catheterization  01/02/2014    Procedure: LOWER EXTREMITY ANGIOGRAPHY;  Surgeon: Fransisco Hertz, MD;  Location: [facility] CATH LAB;  Service: Cardiovascular;;  . Arch aortogram  01/02/2014    Procedure: ARCH AORTOGRAM;  Surgeon: Fransisco Hertz, MD;  Location: [facility] CATH LAB;  Service: Cardiovascular;;  . Wound exploration Right 02/15/2014    Procedure: Incision and Drainage Right Groin;  Surgeon: Sherren Kerns, MD;  Location: [facility] OR;  Service: Vascular;  Laterality: Right;  .  Left heart catheterization with coronary/graft angiogram N/A 02/20/2014    Procedure: LEFT HEART CATHETERIZATION WITH Isabel CapriceORONARY/GRAFT ANGIOGRAM;  Surgeon: Micheline ChapmanMichael D Cooper, MD;  [facility];  Service: Cardiovascular;  Laterality: N/A;  . Tram Right 02/21/2014    Procedure: RIGHT GRACILIS MUSCLE FLAP TO RIGHT GROIN ;  Surgeon: Glenna FellowsBrinda Thimmappa, MD;  Location: [facility] OR;  Service: Plastics;  Laterality: Right;     Allergies  Allergies  Allergen Reactions  . Morphine And Related     Pt felt flushed and was diaphoretic     HPI   64 yo  AA male with PMH of COPD, OSA, HTN, HLD, DM, PVD, SSS s/p PPM 1996 with device change out 2010 and CAD s/p 3v CABG in 2003. Patient is currently confused and is unable to provide most of the history. Pulses history of present illness has been obtained from the prior record. It seems the patient has been decompensating ever since November 2015. He was admitted in November 2015 for ischemic limb and underwent right common femoral artery cannulization by vascular surgery on 12/31/2013. On 11/20, he underwent thrombectomy of the bilateral limb of aortal femoral bypass graft, and endarterectomy of bilateral common femoral artery and deep femoral artery, bypass from left limb of aortal femoral bypass graft to deep femoral artery, Dacron patch angioplasty of distal right limb aortofemoral bypass graft. He returned in 02/11/2014 with acute hypoxic respiratory failure, sepsis and elevated cardiac enzyme. CT of the chest done in the ED did not show any PE. Groin ultrasound showed abscess, vascular surgery was consulted and recommended CT of abdomen and pelvis with contrast on 02/14/2014 which showed multiple loculated abscess and a possible infected native graft. He underwent incision and drainage on 02/15/2014. Wound culture was positive for MRSA, which is concerning for infected graft. As part of the preoperative workup, he underwent stress test which was positive for ischemia. He underwent cardiac catheterization on 02/20/2014 which showed severe triple vessel native disease, patent grafts. Plastic surgery performed Gracilis muscle flap to the right groin 02/21/2014. Postop, he developed acute respiratory failure requiring short-term supportive care with when necessary BiPAP. He was eventually discharged on 03/04/2014 to SNF on IV daptomycin and oral rifampin. He was readmitted near the end of January for worsening edema and drainage from his right groin wound. Vascular surgery was consulted at the time, who recommended supportive  care without intervention.  [facility] complaining of worsening dyspnea that has been progressively gotten worse for the past 3 weeks. The shortness breath is worse with exertion and laying flat. He denies any chest discomfort, fever or chill. On initial arrival, significant laboratory finding include creatinine of 2.92. Mildly elevated troponin. BNP was 2224. White blood cell count of 13.5. Hemoglobin 7.9. Urinalysis showed a few bacteria and negative nitrite. Chest x-ray suggested bilateral perihilar edema which has increased since previous chest x-ray. He was placed on IV diuresis. Blood culture has been obtained on 2/6 which has been negative to date. Urine culture is also negative for bacteria. It is unclear why his white blood cell count was elevated as there is no obvious source of infection. However in the morning of 2/6, patient's temperature was 101. This hospitalization course was complicated acute encephalopathy. Both plastic surgery and vascular surgery has been consulted. Vascular surgery recommended start dry dressing with Ace wrap to help with wound drainage/seroma/lymphatic leak. Echocardiogram was obtained on 03/27/2014 which showed EF 50-55%, moderate LVH, PA peak pressure 45, some  septal flattening suggestive elevated PA pressure. Cardiology has been consulted for heart failure.  Inpatient Medications  . amLODipine  10 mg Oral Daily  . antiseptic oral rinse  7 mL Mouth Rinse BID  . aspirin EC  81 mg Oral Daily  . atorvastatin  80 mg Oral q1800  . DAPTOmycin (CUBICIN)  IV  400 mg Intravenous Q48H  . feeding supplement (PRO-STAT SUGAR FREE 64)  30 mL Oral Daily  . furosemide  80 mg Oral TID  . hydrALAZINE  100 mg Oral 3 times per day  . insulin aspart  0-20 Units Subcutaneous TID WC  . insulin aspart  0-5 Units Subcutaneous QHS  . insulin detemir  20 Units Subcutaneous QHS  . isosorbide mononitrate  30 mg Oral Daily  . metoprolol tartrate  50  mg Oral BID  . polyethylene glycol  17 g Oral Daily  . protein supplement  1 scoop Oral BID WC  . rifampin  300 mg Oral Q12H  . sodium chloride  3 mL Intravenous Q12H  . vitamin C  500 mg Oral BID    Family History Family History  Problem Relation Age of Onset  . Dementia Mother   . Heart attack Father   . Coronary artery disease Father   . Peripheral vascular disease Sister   . Diabetes Brother   . Peripheral vascular disease Brother   . Hypertension Brother   . Coronary artery disease Brother      Social History History   Social History  . Marital Status: Married    Spouse Name: N/A  . Number of Children: N/A  . Years of Education: 9   Occupational History  . 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 Smoking status: Former Smoker -- 2.00 packs/day for 25 years    Types: Cigarettes    Quit date: 01/22/2005  . Smokeless tobacco: Former Neurosurgeon    Quit date: 02/16/1996  . Alcohol Use: No  . Drug Use: No  . Sexual Activity: Not Currently   Other Topics Concern  . Not on file   Social History Narrative   Lives with wife   "slacker in school" - up to 9th grade at [employer]     Review of Systems  Patient is a poor historian with altered mental status. Was able to answer who the president, however when asked which hospital he is in, his answer was [facility] instead of [facility]. When asked what year it is, his answer was 27.  He does follow command. He denies any significant chest discomfort, shortness of breath. He is unable to tell me why he came to the hospital.  He denies any recent fever, chill, cough, dizziness.  All other systems reviewed and are otherwise negative except as noted above.  Physical Exam  Blood pressure 157/74, pulse 82, temperature 98.1 F (36.7 C), temperature source Oral, resp. rate 18, height 5\' 5"  (1.651 m), weight 194 lb 14.2 oz (88.4 kg), SpO2 95 %.    General: Pleasant, NAD Psych: Normal affect. Neuro: Alert and oriented X 1. Moves all extremities spontaneously. HEENT: Normal  Neck: Supple without bruits. Mild JVD noted on R side. Lungs:  Resp regular and unlabored. Rale on anterior exam, was only able to complete half of posterior lung exam when the pt suddenly lay back down.  Heart: RRR no s3, s4, or murmurs. Abdomen: Soft, non-tender, BS +  x 4. Mildly distended Extremities: No clubbing, cyanosis. DP/PT/Radials 2+ and equal bilaterally. 1+ pitting edema in LE  Labs  No results for input(s): CKTOTAL, CKMB, TROPONINI in the last 72 hours. Lab Results  Component Value Date   WBC 10.3 03/26/2014   HGB 10.1* 03/26/2014   HCT 32.4* 03/26/2014   MCV 85.5 03/26/2014   PLT 352 03/26/2014    Recent Labs Lab 03/28/14 0455  NA 142  K 4.6  CL 103  CO2 29  BUN 88*  CREATININE 2.54*  CALCIUM 8.6  GLUCOSE 99   Lab Results  Component Value Date   CHOL 125 12/03/2013   HDL 23* 12/03/2013   LDLCALC 39 12/03/2013   TRIG 317* 12/03/2013    Radiology/Studies  Dg Chest 2 View (if Patient Has Fever And/or Copd)  04-16-2014   CLINICAL DATA:  Shortness of breath for 2 days, fever  EXAM: CHEST  2 VIEW  COMPARISON:  03/11/2014  FINDINGS: Dual lead right-sided pacer in place. Evidence of CABG noted. Left-sided PICC line in place with tip over the mid SVC. Hazy perihilar airspace opacities are noted in the setting of cardiomegaly, suggesting edema. Small pleural effusions are present.  IMPRESSION: Cardiomegaly with perihilar opacities suggesting edema, increased since previously.  Small pleural effusions.   Electronically Signed   By: Christiana Pellant M.D.   On: 2014/04/16 15:34   Ct Chest Wo Contrast  03/28/2014   CLINICAL DATA:  Shortness of breath and bilateral pulmonary infiltrates  EXAM: CT CHEST WITHOUT CONTRAST  TECHNIQUE: Multidetector CT imaging of the chest was performed following the standard protocol without IV contrast.   COMPARISON:  Chest x-ray from earlier in the same day.  FINDINGS: The lungs demonstrate bilateral confluent infiltrates with air bronchograms most prominent in the upper lobes bilaterally. Bilateral pleural effusions right greater than left are seen. The overall appearance is similar to that noted on the recent chest x-ray. This likely represents a combination of CHF and superimposed multifocal pneumonia.  Aortic calcifications are noted as well as changes of prior coronary bypass grafting. No thoracic aortic aneurysm is noted. No significant hilar or mediastinal adenopathy is noted. Heavy coronary calcifications are seen.  The visualized upper abdomen reveals no acute abnormality. Degenerative change of the thoracic spine is noted.  IMPRESSION: Multifocal bilateral infiltrates likely representing a combination of pulmonary edema and superimposed multifocal pneumonia.  Bilateral pleural effusions.   Electronically Signed   By: Alcide Clever M.D.   On: 03/28/2014 11:12   Dg Chest Port 1 View  03/28/2014   CLINICAL DATA:  Pulmonary edema and shortness of breath.  EXAM: PORTABLE CHEST - 1 VIEW  COMPARISON:  03/26/2014  FINDINGS: Mild enlargement of heart is unchanged. Stable position of the dual chamber right cardiac pacemaker. Bilateral patchy airspace disease is unchanged. Stable densities at the left lung base could represent pleural fluid. Negative for pneumothorax.  IMPRESSION: Stable chest radiograph findings. Bilateral airspace disease could represent pulmonary edema.  Probable left pleural effusion.   Electronically Signed   By: Richarda Overlie M.D.   On: 03/28/2014 08:43   Dg Chest Port 1 View  03/26/2014   CLINICAL DATA:  64 year old male with pulmonary edema, cough and congestion  EXAM: PORTABLE CHEST - 1 VIEW  COMPARISON:  Prior chest x-ray Apr 16, 2014  FINDINGS: Stable right subclavian approach cardiac rhythm maintenance device. Leads project over the right atrium and right ventricle. Stable cardiomegaly.  Surgical changes of prior median sternotomy for multivessel CABG. Persistent pulmonary edema with  perhaps slight interval improvement. Suspect small bilateral layering effusions. The tip of the left upper extremity PICC overlies the superior cavoatrial junction. No acute osseous abnormality.  IMPRESSION: Persistent pulmonary edema with perhaps slight interval improvement.  Small bilateral pleural effusions.   Electronically Signed   By: Malachy Moan M.D.   On: 03/26/2014 08:53   Dg Chest Port 1 View  03/11/2014   CLINICAL DATA:  Infected stent in the leg. Dialysis patient. Patient has missed 3 dialysis appointment due to lack of access. Concern for fluid overload. Short of breath.  EXAM: PORTABLE CHEST - 1 VIEW  COMPARISON:  02/27/2014  FINDINGS: Postoperative changes in the mediastinum. Cardiac pacemaker. Left PICC catheter. Appliances are unchanged since prior study. Cardiac enlargement with prominent pulmonary vascularity and perihilar infiltration consistent with edema. Small bilateral pleural effusions and basilar atelectasis, greater on the left, and increasing since prior study.  IMPRESSION: Cardiac enlargement with pulmonary vascular congestion and edema. Small bilateral pleural effusions and basilar atelectasis, increasing since prior study.   Electronically Signed   By: Burman Nieves M.D.   On: 03/11/2014 22:34   Dg Chest Port 1 View  02/27/2014   CLINICAL DATA:  Respiratory failure  EXAM: PORTABLE CHEST - 1 VIEW  COMPARISON:  Portable chest x-ray of February 26, 2014  FINDINGS: The lungs remain mildly hypoinflated. The cardiopericardial silhouette remains enlarged. The retrocardiac region remains dense and the left hemidiaphragm obscured. There is engorgement of central pulmonary vascularity. There are 8 intact sternal wires from previous CABG. The permanent pacemaker is in stable position radiographically.  IMPRESSION: Congestive heart failure with pulmonary interstitial edema and small  bilateral pleural effusions and persistent left lower lobe atelectasis. There has not been significant interval change since yesterday's study.   Electronically Signed   By: David  [place]   On: 02/27/2014 07:35    ECG  Atrial paced rhythm  ASSESSMENT AND PLAN  1. Acute on chronic diastolic HF  - patient still appear to be fluid overload on exam and CXR  - low suspicion for infection given the fact he has been on Abx  - will check back to IV lasix for aggressive diuresis. He was on  IV BID, however less ideal diuresis given his renal dysfunction and the fact he was on  BID of PO lasix at home which equate to  BID IV lasix  - Echo EF 50-55%, elevated PA peak pressure  - will need to be diuresed aggressively. He did respond to  TID of lasix. After discussing with Dr. [place], we will restart IV lasix at  BID. Close monitoring of his renal function   2. Acute respiratory failure 2/2 acute on chronic diastolic HF and possibly PNA (elevated WBC, fever, although pt has been on IV abx)  3. Acute encephalopathy  4. Anemia: stable 5. CAD s/p 3v CABG in 2003  - cath 02/20/2014 which showed severe triple vessel native disease, patent grafts 6. SSS s/p PPM 1996 with device change out 2010  7. COPD 8. OSA 9. HTN 10. HLD 11. DM 12. PVD: recent graft infection, on 2 abx  Signed, Azalee Course, [place] 03/28/2014, 2:25 PM   Primary dx: acute on chronic diastolic CHF.  Patient seen and examined and history reviewed. Agree with above findings and plan. 64 yo BM with very complex history as noted above. Patient is unable to give reliable history due to encephalopathy. On exam he has JVD, diffuse bilateral coarse rales and 1-2+ edema. BNP is elevated and CXR  demonstrates significant pulmonary edema. He did receive IV lasix for several days but really did not get an effective diuresis until the last day-then he was switched to po. It is unclear what his dry weight is but he is clearly volume  overloaded. I would resume lasix 80 mg IV bid. Monitor renal function, weight, and I/O carefully. Given his stage 4 CKD he may well need higher doses of diuretics to get a good clinical response. Will repeat CXR and BNP on Monday. If he does not respond to 80 mg IV lasix I would increase to 120 mg in the am and consider adding metolazone.   Peter [place], MDFACC 03/28/2014 3:27 PM

## 2014-03-28 NOTE — Progress Notes (Signed)
Peripherally Inserted Central Catheter/Midline Placement  The IV Nurse has discussed with the patient and/or persons authorized to consent for the patient, the purpose of this procedure and the potential benefits and risks involved with this procedure.  The benefits include less needle sticks, lab draws from the catheter and patient may be discharged home with the catheter.  Risks include, but not limited to, infection, bleeding, blood clot (thrombus formation), and puncture of an artery; nerve damage and irregular heat beat.  Alternatives to this procedure were also discussed.  PICC/Midline Placement Documentation        Lisabeth DevoidGibbs, Catalaya Garr Jeanette 03/28/2014, 1:43 PM Phone consent obtained from wife

## 2014-03-28 NOTE — Progress Notes (Signed)
Plastic Surgery  Temp:  [97.9 F (36.6 C)-98.2 F (36.8 C)] 98.1 F (36.7 C) (02/12 1425) Pulse Rate:  [69-82] 71 (02/12 1425) Resp:  [18-22] 18 (02/12 0735) BP: (157-187)/(60-136) 166/89 mmHg (02/12 1546) SpO2:  [70 %-98 %] 98 % (02/12 1425) Weight:  [88.4 kg (194 lb 14.2 oz)] 88.4 kg (194 lb 14.2 oz) (02/12 0417)   PE:  Dressing change just done-exam deferred Dressings removed with similar clear drainage, significant amount  A/P: S/p gracilis flap with lymphatic leak that is spontaneously draining from donor site incision  Sister at bedside- reviewed problem. Purpose of graft for coverage vascular graft appears to be stable. Donor site is problem. Discussed options continue compression and observation, possible surgical intervention for closure over drain or VAC. I am hesitant for latter option given significant protein malnutrition.  Will return 2/15 for exam.   Glenna FellowsBrinda Joye Wesenberg, MD [facility] Plastic & Reconstructive Surgery [phone]

## 2014-03-28 NOTE — Progress Notes (Signed)
Pt is confused and has pulled out his PICC line. There's no bleeding at site. Will continue to monitor

## 2014-03-28 NOTE — Progress Notes (Signed)
ANTIBIOTIC CONSULT NOTE - Follow up  Pharmacy Consult for Daptomycin Indication: MRSA groin abscess  Allergies  Allergen Reactions  . Morphine And Related     Pt felt flushed and was diaphoretic     Patient Measurements: Height:  (165.1 cm) Weight: 194 lb 14.2 oz (88.4 kg) IBW/kg (Calculated) : 61.5  Vital Signs: Temp: 98.1 F (36.7 C) (02/12 0735) Temp Source: Oral (02/12 0417) BP: 157/74 mmHg (02/12 0735) Pulse Rate: 82 (02/12 0735) Intake/Output from previous day: 02/11 0701 - 02/12 0700 In: 360 [P.O.:360] Out: 2600 [Urine:2600] Intake/Output from this shift: Total I/O In: 120 [P.O.:120] Out: 200 [Urine:200]  Labs:  Recent Labs  03/26/14 0505 03/27/14 0421 03/28/14 0455  WBC 10.3  --   --   HGB 10.1*  --   --   PLT 352  --   --   CREATININE 3.21* 2.86* 2.54*   Estimated Creatinine Clearance: 30 mL/min (by C-G formula based on Cr of 2.54). No results for input(s): VANCOTROUGH, VANCOPEAK, VANCORANDOM, GENTTROUGH, GENTPEAK, GENTRANDOM, TOBRATROUGH, TOBRAPEAK, TOBRARND, AMIKACINPEAK, AMIKACINTROU, AMIKACIN in the last 72 hours.   Microbiology: Recent Results (from the past 720 hour(s))  Clostridium Difficile by PCR     Status: None   Collection Time: 03/03/14  5:31 PM  Result Value Ref Range Status   C difficile by pcr NEGATIVE NEGATIVE Final  MRSA PCR Screening     Status: Abnormal   Collection Time: 03/14/14 10:05 AM  Result Value Ref Range Status   MRSA by PCR POSITIVE (A) NEGATIVE Final    Comment:        The GeneXpert MRSA Assay (FDA approved for NASAL specimens only), is one component of a comprehensive MRSA colonization surveillance program. It is not intended to diagnose MRSA infection nor to guide or monitor treatment for MRSA infections. RESULT CALLED TO, READ BACK BY AND VERIFIED WITH: T. LUCAS RN 13:00 03/14/14 ([email or web address])   MRSA PCR Screening     Status: None   Collection Time: 04/10/2014  8:30 PM  Result Value Ref Range Status   MRSA by PCR NEGATIVE NEGATIVE Final    Comment:        The GeneXpert MRSA Assay (FDA approved for NASAL specimens only), is one component of a comprehensive MRSA colonization surveillance program. It is not intended to diagnose MRSA infection nor to guide or monitor treatment for MRSA infections.   Culture, blood (routine x 2)     Status: None   Collection Time: 03/22/14 12:06 PM  Result Value Ref Range Status   Specimen Description BLOOD RIGHT ARM  10 ML IN [facility] BOTTLE  Final   Special Requests NONE  Final   Culture   Final    NO GROWTH 5 DAYS Performed at [facility]    Report Status 03/28/2014 FINAL  Final  Urine culture     Status: None   Collection Time: 03/22/14  3:24 PM  Result Value Ref Range Status   Specimen Description URINE, CATHETERIZED  Final   Special Requests NONE  Final   Colony Count NO GROWTH Performed at [facility]   Final   Culture NO GROWTH Performed at [facility]   Final   Report Status 03/24/2014 FINAL  Final  Culture, blood (routine x 2)     Status: None (Preliminary result)   Collection Time: 03/22/14  3:30 PM  Result Value Ref Range Status   Specimen Description BLOOD RIGHT HAND  Final   Special Requests BOTTLES DRAWN  AEROBIC AND ANAEROBIC 5CC EACH  Final   Culture   Final           BLOOD CULTURE RECEIVED NO GROWTH TO DATE CULTURE WILL BE HELD FOR 5 DAYS BEFORE ISSUING A FINAL NEGATIVE REPORT [facility]    Report Status PENDING  Incomplete    Medical History: Past Medical History  Diagnosis Date  . Arteriosclerotic cardiovascular disease (ASCVD)     CABG in 2003 for 3 VD; 07/2007: Stress nuclear-inferior infarction without ischemia; normal EF by echo in 06/2005  . Diabetes mellitus type II   . Hyperlipidemia   . Degenerative joint disease     Left knee  . Cerebrovascular disease 2003    Carotid ultrasound in 2010: Patent right CEA site, 60-79% left internal carotid artery  .  Peripheral vascular disease 2007    aortobifemoral BPG-2007  . Anemia   . Peripheral neuropathy   . Hypertension   . Sick sinus syndrome 1996    permanent pacemaker-1996; generator change in 2010  . Chronic obstructive pulmonary disease   . Obesity   . Obstructive sleep apnea   . Benign prostatic hypertrophy   . Gastroesophageal reflux disease   . Tobacco abuse, in remission     50 pack years total consumption; Quit in 2007    Medications:  Scheduled:  . amLODipine  10 mg Oral Daily  . antiseptic oral rinse  7 mL Mouth Rinse BID  . aspirin EC  81 mg Oral Daily  . atorvastatin  80 mg Oral q1800  . DAPTOmycin (CUBICIN)  IV  400 mg Intravenous Q48H  . feeding supplement (PRO-STAT SUGAR FREE 64)  30 mL Oral Daily  . furosemide  80 mg Oral TID  . hydrALAZINE  100 mg Oral 3 times per day  . insulin aspart  0-20 Units Subcutaneous TID WC  . insulin aspart  0-5 Units Subcutaneous QHS  . insulin detemir  20 Units Subcutaneous QHS  . isosorbide mononitrate  30 mg Oral Daily  . metoprolol tartrate  50 mg Oral BID  . polyethylene glycol  17 g Oral Daily  . protein supplement  1 scoop Oral BID WC  . rifampin  300 mg Oral Q12H  . sodium chloride  3 mL Intravenous Q12H  . 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 S/p I&D on 02/15/14 and cultures grew out MRSA. He was on vancomycin but was switched to daptomycin d/t acute renal failure and acummulation of vancomycin. Plan is to treat with daptomycin and rifampin for 6 weeks, start date 02/28/14 (stop date would be 04/11/14). Pharmacy is consulted to continue dosing daptomycin during this  admission.  [facility] with worsening SOB with inability to lie flat to sleep, likely acute diastolic heart failure.  2/6 ck 90 2/12 Scr 2.54, CrCl ~4630mls/min  Goal of Therapy:  Eradication of infection Dose appropriate for indication, renal function  Plan:   Continue daptomycin 400mg  (4 mg/kg for soft tissue infection) IV q48h  Check ck weekly, next 2-13  Continue to monitor renal function and adjust dosing interval as needed  Arley Phenixllen Hailley Byers RPh 03/28/2014, 10:24 AM Pager [phone]

## 2014-03-29 LAB — BASIC METABOLIC PANEL
Anion gap: 8 (ref 5–15)
BUN: 83 mg/dL — AB (ref 6–23)
CHLORIDE: 103 mmol/L (ref 96–112)
CO2: 31 mmol/L (ref 19–32)
Calcium: 8.3 mg/dL — ABNORMAL LOW (ref 8.4–10.5)
Creatinine, Ser: 2.31 mg/dL — ABNORMAL HIGH (ref 0.50–1.35)
GFR calc Af Amer: 33 mL/min — ABNORMAL LOW (ref >90)
GFR calc non Af Amer: 28 mL/min — ABNORMAL LOW (ref >90)
Glucose, Bld: 156 mg/dL — ABNORMAL HIGH (ref 70–99)
POTASSIUM: 4.5 mmol/L (ref 3.5–5.1)
Sodium: 142 mmol/L (ref 135–145)

## 2014-03-29 LAB — GLUCOSE, CAPILLARY
Glucose-Capillary: 158 mg/dL — ABNORMAL HIGH (ref 70–99)
Glucose-Capillary: 177 mg/dL — ABNORMAL HIGH (ref 70–99)
Glucose-Capillary: 118 mg/dL — ABNORMAL HIGH (ref 70–99)
Glucose-Capillary: 175 mg/dL — ABNORMAL HIGH (ref 70–99)

## 2014-03-29 LAB — CK: Total CK: 117 U/L (ref 7–232)

## 2014-03-29 MED ORDER — LABETALOL HCL 5 MG/ML IV SOLN
10.0000 mg | INTRAVENOUS | Status: DC
  Administered 2014-03-29 – 2014-03-30 (×8): 10 mg via INTRAVENOUS
  Filled 2014-03-29 (×13): qty 4

## 2014-03-29 MED ORDER — FUROSEMIDE 10 MG/ML IJ SOLN
80.0000 mg | Freq: Four times a day (QID) | INTRAMUSCULAR | Status: DC
  Administered 2014-03-29 – 2014-03-30 (×6): 80 mg via INTRAVENOUS
  Filled 2014-03-29 (×7): qty 8

## 2014-03-29 NOTE — Progress Notes (Signed)
[facility] Progress Note   Collin LeitzWilliam F Leaman YQM:[id number] DOB: 10/10/1950 DOA: 04/06/2014 PCP: [name], [name], MD  Brief narrative: [name] is a 64 y.o. 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  He comes in for shortness of breath and hypoxia. For the last 2 days he has not been able to lay flat to sleep and can not walk to the bathroom without getting short of breath. CXR reveals pulm edema. Has chronic pedal edema that was unchanged.  Found to have a fever in the ER as well. Temp was 101 on 2/6.   Subjective: Alert, no complaints. Remains confused on and off.   Assessment/Plan: Principal Problem:   Acute respiratory failure with hypoxia- acute on chronic diastolic CHF- mild troponin elevation without chest pain - suspect acute diastolic CHF- started on lasix infusion, transitioned to BID Lasix - repeat CXR shows continued edema- increased Lasix to TID - obtain strict control of BP  - repeated ECHO - mod LVH with elevated LV filling pressure - cont to give Lasix-  pulled PICC out on 2/12- have replaced- cont IV Lasix   Active Problems: Sepsis- fever on admission, tachypnea (due to CHF likely), leukocytosis  - source uncertain- cont antibiotics for right groin / graft infection- Dapto and Rifampin - fever, leukocytosis and tachypnea have all resolved    Acute encephalopathy - cont to follow- cause uncertain - checked CO2 level with ABG  Ammonia which were both normal    Essential hypertension - d/c Bidil- start higher  dose of Hydralazine and Imdur at 30 mg,  - add Norvasc - cont Metoprolol - added Clonidine on 2/10    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 - cont Levemir and sliding scale insulin    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 -cont to follow BUN and Cr with diuresis-  Normocytic anemia: - hemoglobin has remained around 7-8 last several admissions - given 2 U PRBC as Hg was 7.2 (2/7), appropriate increase in Hg post transfusion  Morbid obesity, Body mass index is 33.38   Code Status: full code Family Communication:  Disposition Plan:  will go to SNF DVT prophylaxis: SCDs  Consultants: Vas sx  Procedures:   Antibiotics: Anti-infectives    Start     Dose/Rate Route Frequency Ordered Stop   03/23/14 2200  DAPTOmycin (CUBICIN) 400 mg in sodium chloride 0.9 % IVPB     400 mg 216 mL/hr over 30 Minutes Intravenous Every 48 hours 04-06-14 2106     04-06-14 2200  rifampin (RIFADIN) capsule 300 mg     300 mg Oral Every 12 hours 04-06-14 2007     04-06-14 2100  DAPTOmycin (CUBICIN) 600 mg in sodium chloride 0.9 % IVPB  Status:  Discontinued     600 mg 224 mL/hr over 30 Minutes Intravenous Every 48 hours 04-06-14 2007 03/22/14 1106   04-06-14 2100  levofloxacin (LEVAQUIN) tablet 500 mg  Status:  Discontinued     500 mg Oral Every 48 hours 04-06-14 2007 03/23/14 1236     Objective: [employer]  03/27/14 0459 03/28/14 0417 03/29/14 0541  Weight: 90.719 kg (200 lb) 88.4 kg (194 lb 14.2 oz) 87.8 kg (193 lb 9 oz)    Intake/Output Summary (Last 24 hours) at 03/29/14 1041 Last data filed at 03/29/14 0449  Gross per 24 hour  Intake    480 ml  Output   1075 ml  Net   -595 ml     Vitals Filed Vitals:   03/28/14 1546 03/28/14 1634 03/28/14 2019 03/29/14 0541  BP: 166/89 130/94 151/75 179/83  Pulse:   74 70  Temp:   98.4 F (36.9 C) 98.6 F (37 C)  TempSrc:   Oral Oral  Resp:   20 20  Height:      Weight:    87.8 kg (193 lb 9 oz)  SpO2:    98% 97%    Exam: General: Awake and alert, confused, No acute respiratory distress Lungs: CTA b/l  Cardiovascular: Regular rate and rhythm without murmur gallop or rub normal S1 and S2 Abdomen: Nontender, nondistended, soft, bowel sounds positive, no rebound, no ascites, no appreciable mass Extremities: No significant cyanosis, clubbing, +2 edema bilateral lower extremities  Data Reviewed: Basic Metabolic Panel:  Recent Labs Lab 03/25/14 0430 03/26/14 0505 03/27/14 0421 03/28/14 0455 03/29/14 0500  NA 137 137 138 142 142  K 5.2* 5.6* 4.9 4.6 4.5  CL 100 101 103 103 103  CO2 GLUCOSE 155* 122* 171* 99 156*  BUN 84* 96* 100* 88* 83*  CREATININE 2.88* 3.21* 2.86* 2.54* 2.31*  CALCIUM 8.6 8.5 8.4 8.6 8.3*  MG  --   --  2.3  --   --    Liver Function Tests: No results for input(s): AST, ALT, ALKPHOS, BILITOT, PROT, ALBUMIN in the last 168 hours. No results for input(s): LIPASE, AMYLASE in the last 168 hours.  Recent Labs Lab 03/27/14 1125  AMMONIA 39*   CBC:  Recent Labs Lab 03/23/14 0440 03/24/14 0507 03/25/14 0430 03/26/14 0505  WBC 11.0* 12.9* 12.2* 10.3  HGB 7.2* 10.4* 10.0* 10.1*  HCT 23.6* 32.9* 32.6* 32.4*  MCV 83.1 85.5 86.0 85.5  PLT 324 362 369 352   Cardiac Enzymes:  Recent Labs Lab 03/29/14 0500  CKTOTAL 117   BNP (last 3 results)  Recent Labs  02/17/14 1030 03/11/14 2225 2014/04/16 1610  BNP 1410.9* 2715.9* 2224.3*    ProBNP (last 3 results) No results for input(s): PROBNP in the last 8760 hours.  CBG:  Recent Labs Lab 03/28/14 0744 03/28/14 1153 03/28/14 1701 03/28/14 2140 03/29/14 0756  GLUCAP 86 103* 77 95 158*    Recent Results (from the past 240 hour(s))  MRSA PCR Screening     Status: None   Collection Time: 04/16/2014  8:30 PM  Result Value Ref Range Status   MRSA by PCR NEGATIVE NEGATIVE Final    Comment:        The GeneXpert MRSA Assay (FDA approved for NASAL specimens only), is one component of  a comprehensive MRSA colonization surveillance program. It is not intended to diagnose MRSA infection nor to guide or monitor treatment for MRSA infections.   Culture, blood (routine x 2)     Status: None   Collection Time: 03/22/14 12:06 PM  Result Value Ref Range Status   Specimen Description BLOOD RIGHT ARM  10 ML IN [facility] BOTTLE  Final   Special Requests NONE  Final   Culture   Final    NO GROWTH 5 DAYS Performed  at [facility]    Report Status 03/28/2014 FINAL  Final  Urine culture     Status: None   Collection Time: 03/22/14  3:24 PM  Result Value Ref Range Status   Specimen Description URINE, CATHETERIZED  Final   Special Requests NONE  Final   Colony Count NO GROWTH Performed at [facility]   Final   Culture NO GROWTH Performed at [facility]   Final   Report Status 03/24/2014 FINAL  Final  Culture, blood (routine x 2)     Status: None (Preliminary result)   Collection Time: 03/22/14  3:30 PM  Result Value Ref Range Status   Specimen Description BLOOD RIGHT HAND  Final   Special Requests BOTTLES DRAWN AEROBIC AND ANAEROBIC 5CC EACH  Final   Culture   Final           BLOOD CULTURE RECEIVED NO GROWTH TO DATE CULTURE WILL BE HELD FOR 5 DAYS BEFORE ISSUING A FINAL NEGATIVE REPORT Performed at [facility]    Report Status PENDING  Incomplete     Studies:  Recent x-ray studies have been reviewed in detail by the Attending Physician  Scheduled Meds:  Scheduled Meds: . amLODipine  10 mg Oral Daily  . antiseptic oral rinse  7 mL Mouth Rinse BID  . aspirin EC  81 mg Oral Daily  . atorvastatin  80 mg Oral q1800  . DAPTOmycin (CUBICIN)  IV  400 mg Intravenous Q48H  . feeding supplement (PRO-STAT SUGAR FREE 64)  30 mL Oral Daily  . furosemide  80 mg Intravenous Q6H  . hydrALAZINE  100 mg Oral 3 times per day  . insulin aspart  0-20 Units Subcutaneous TID WC  . insulin aspart  0-5 Units Subcutaneous QHS  . insulin detemir  20  Units Subcutaneous QHS  . isosorbide mononitrate  30 mg Oral Daily  . labetalol  10 mg Intravenous Q4H  . metoprolol tartrate  50 mg Oral BID  . polyethylene glycol  17 g Oral Daily  . protein supplement  1 scoop Oral BID WC  . rifampin  300 mg Oral Q12H  . sodium chloride  3 mL Intravenous Q12H  . vitamin C  500 mg Oral BID   Continuous Infusions:   Time spent on care of this patient: 35 min   Quade Ramirez, MD 03/29/2014, 10:41 AM  LOS: 8 days   [facility] Office  [phone] Pager - Text Page per [email or web address]  If 7PM-7AM, please contact night-coverage [email or web address]

## 2014-03-29 NOTE — Progress Notes (Addendum)
Plastic Surgery  Temp:  [98.1 F (36.7 C)-98.6 F (37 C)] 98.6 F (37 C) (02/13 0541) Pulse Rate:  [70-74] 70 (02/13 0541) Resp:  [20] 20 (02/13 0541) BP: (130-187)/(75-136) 179/83 mmHg (02/13 0541) SpO2:  [70 %-98 %] 97 % (02/13 0541) Weight:  [87.8 kg (193 lb 9 oz)] 87.8 kg (193 lb 9 oz) (02/13 0541)   PE:  Dressing change done Groin incision intact, soft, moist from maceration skin folds Dressings clear yellow drainage, significant amount with undermining donor site medial thigh skin flaps at least 10cm  A/P: S/p gracilis flap with lymphatic leak that is spontaneously draining from donor site incision  This fluid is tracking from groin into thigh. No real progress over last week in hospital with compression, though compression also limited due to pt body habitus and immobility. Will discuss with wife surgical intervention for debridement, VAC placement this week if clinically suitable for OR and anesthesia.  Glenna FellowsBrinda Piccola [place], MD [facility] Plastic & Reconstructive Surgery [phone]  ADDENDUM: Spoke with wife Alfonse RasDolores. Discussed current condition and plan for surgical debridement, VAC placement. Counseled in this setting will not go back through groin incision, suspect leakage tracking from groin. Will plan OR for 04/01/14 if other issues stable. Will get consent for wife to sign, she will try to be here today, having transportation issues.

## 2014-03-29 NOTE — Progress Notes (Signed)
Subjective:  Rambling history difficult to obtain, mildly confused.  He is diuresing with weight down some.  No overt shortness of breath today.  Objective:  Vital Signs in the last 24 hours: BP 179/83 mmHg  Pulse 70  Temp(Src) 98.6 F (37 C) (Oral)  Resp 20  Ht 5\' 5"  (1.651 m)  Wt 87.8 kg (193 lb 9 oz)  BMI 32.21 kg/m2  SpO2 97%  Physical Exam: Middle-aged black male somewhat rambling historian Lungs: Rhonchi bilaterally  Cardiac:  Regular rhythm, normal S1 and S2, no S3, neck veins up some Extremities: 1-2+ edema edema present  Intake/Output from previous day: 02/12 0701 - 02/13 0700 In: 600 [P.O.:600] Out: 1275 [Urine:1275] Weight Filed Weights   03/27/14 0459 03/28/14 0417 03/29/14 0541  Weight: 90.719 kg (200 lb) 88.4 kg (194 lb 14.2 oz) 87.8 kg (193 lb 9 oz)    Lab Results: Basic Metabolic Panel:  Recent Labs  16/11/9600/12/16 0455 03/29/14 0500  NA 142 142  K 4.6 4.5  CL 103 103  CO2 29 31  GLUCOSE 99 156*  BUN 88* 83*  CREATININE 2.54* 2.31*   BNP    Component Value Date/Time   BNP 2224.3* 11/08/2014 1610   Telemetry: Atrial paced ventricular sensed rhythm  Assessment/Plan:  1.  Acute on chronic diastolic heart failure-some diuresis overnight 2.  Stage IV chronic kidney disease-currently stable 3.  Encephalopathy 4.  Coronary artery disease with previous bypass grafting 5.  Previous permanent pacemaker with current atrial pacing 6.  History of graft infection on antibiotic treatment  Recommendations:  Appears to be diuresing and is clinically better.  He is encephalopathic making history difficult.  Weight is down and would continue diuretic dose at current dose.  Renal function stable.      Darden PalmerW. Spencer Elmond Poehlman, Jr.  MD [facility]ACC Cardiology  03/29/2014, 8:42 AM

## 2014-03-30 ENCOUNTER — Inpatient Hospital Stay (HOSPITAL_COMMUNITY): Payer: Medicare HMO

## 2014-03-30 DIAGNOSIS — R410 Disorientation, unspecified: Secondary | ICD-10-CM

## 2014-03-30 LAB — BASIC METABOLIC PANEL
Anion gap: 5 (ref 5–15)
BUN: 77 mg/dL — AB (ref 6–23)
CO2: 33 mmol/L — ABNORMAL HIGH (ref 19–32)
CREATININE: 2.23 mg/dL — AB (ref 0.50–1.35)
Calcium: 8.5 mg/dL (ref 8.4–10.5)
Chloride: 104 mmol/L (ref 96–112)
GFR calc non Af Amer: 29 mL/min — ABNORMAL LOW (ref >90)
GFR, EST AFRICAN AMERICAN: 34 mL/min — AB (ref >90)
Glucose, Bld: 112 mg/dL — ABNORMAL HIGH (ref 70–99)
Potassium: 4.3 mmol/L (ref 3.5–5.1)
Sodium: 142 mmol/L (ref 135–145)

## 2014-03-30 LAB — CULTURE, BLOOD (ROUTINE X 2): Culture: NO GROWTH

## 2014-03-30 LAB — CBC
HEMATOCRIT: 34.5 % — AB (ref 39.0–52.0)
Hemoglobin: 10.6 g/dL — ABNORMAL LOW (ref 13.0–17.0)
MCH: 26.8 pg (ref 26.0–34.0)
MCHC: 30.7 g/dL (ref 30.0–36.0)
MCV: 87.3 fL (ref 78.0–100.0)
Platelets: 373 10*3/uL (ref 150–400)
RBC: 3.95 MIL/uL — ABNORMAL LOW (ref 4.22–5.81)
RDW: 17.8 % — ABNORMAL HIGH (ref 11.5–15.5)
WBC: 12.4 10*3/uL — AB (ref 4.0–10.5)

## 2014-03-30 LAB — GLUCOSE, CAPILLARY
GLUCOSE-CAPILLARY: 131 mg/dL — AB (ref 70–99)
GLUCOSE-CAPILLARY: 128 mg/dL — AB (ref 70–99)
Glucose-Capillary: 181 mg/dL — ABNORMAL HIGH (ref 70–99)
Glucose-Capillary: 205 mg/dL — ABNORMAL HIGH (ref 70–99)

## 2014-03-30 LAB — VITAMIN B12: Vitamin B-12: 1266 pg/mL — ABNORMAL HIGH (ref 211–911)

## 2014-03-30 MED ORDER — LORAZEPAM 1 MG PO TABS
1.0000 mg | ORAL_TABLET | Freq: Once | ORAL | Status: AC
  Administered 2014-03-31: 1 mg via ORAL
  Filled 2014-03-30: qty 1

## 2014-03-30 MED ORDER — FUROSEMIDE 20 MG PO TABS
60.0000 mg | ORAL_TABLET | Freq: Once | ORAL | Status: AC
Start: 2014-03-30 — End: 2014-03-31
  Administered 2014-03-31: 60 mg via ORAL
  Filled 2014-03-30 (×2): qty 1

## 2014-03-30 NOTE — Progress Notes (Signed)
[facility] Progress Note   [name]:[id number] DOB: 06/03/1950 DOA: 03/29/2014 PCP: [name], [name], MD  Brief narrative: [name] is a 64 y.o. 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  He comes in for shortness of breath and hypoxia. For the last 2 days he has not been able to lay flat to sleep and can not walk to the bathroom without getting short of breath. CXR reveals pulm edema. Has chronic pedal edema that was unchanged.  Found to have a fever in the ER as well. Temp was 101 on 2/6.   Subjective: Alert, no complaints. Remains confused on and off.   Assessment/Plan: Principal Problem:   Acute respiratory failure with hypoxia- acute on chronic diastolic CHF- mild troponin elevation without chest pain - suspect acute diastolic CHF- started on lasix infusion, transitioned to BID Lasix - repeat CXR shows continued edema- increased Lasix to TID - obtain strict control of BP  - repeated ECHO - mod LVH with elevated LV filling pressure - cont to give Lasix-  pulled PICC out on 2/12- have replaced- cont IV Lasix   Active Problems: Sepsis- fever on admission, tachypnea (due to CHF likely), leukocytosis  - source uncertain- cont antibiotics for right groin / graft infection- Dapto and Rifampin - fever, leukocytosis and tachypnea have all resolved    Acute encephalopathy - cont to follow- cause is still uncertain - checked CO2 level with ABG and  Ammonia which were both normal - will get CT head and B12 level today     Essential hypertension - d/c Bidil- start higher dose of Hydralazine and Imdur at 30 mg,  - add Norvasc - cont Metoprolol - added Clonidine on 2/10    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 - cont Levemir and sliding scale insulin    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 -cont to follow BUN and Cr with diuresis-  Normocytic anemia: - hemoglobin has remained around 7-8 last several admissions - given 2 U PRBC as Hg was 7.2 (2/7), appropriate increase in Hg post transfusion  Morbid obesity, Body mass index is 33.38   Code Status: full code Family Communication:  Disposition Plan:  will go to SNF DVT prophylaxis: SCDs  Consultants: Vas sx  Procedures:   Antibiotics: Anti-infectives    Start     Dose/Rate Route Frequency Ordered Stop   03/23/14 2200  DAPTOmycin (CUBICIN) 400 mg in sodium chloride 0.9 % IVPB     400 mg 216 mL/hr over 30 Minutes Intravenous Every 48 hours 2015/01/21 2106     2015/01/21 2200  rifampin (RIFADIN) capsule 300 mg     300 mg Oral Every 12 hours 2015/01/21 2007     2015/01/21 2100  DAPTOmycin (CUBICIN) 600 mg in sodium chloride 0.9 % IVPB  Status:  Discontinued     600 mg 224 mL/hr over 30 Minutes Intravenous Every 48 hours 2015/01/21 2007 03/22/14 1106   2015/01/21 2100  levofloxacin (LEVAQUIN) tablet 500 mg  Status:  Discontinued     500 mg Oral Every 48  hours 03/25/2014 2007 03/23/14 1236     Objective: Filed Weights   03/28/14 0417 03/29/14 0541 03/30/14 0500  Weight: 88.4 kg (194 lb 14.2 oz) 87.8 kg (193 lb 9 oz) 84.4 kg (186 lb 1.1 oz)    Intake/Output Summary (Last 24 hours) at 03/30/14 1156 Last data filed at 03/30/14 0500  Gross per 24 hour  Intake    924 ml  Output   3350 ml  Net  -2426 ml     Vitals Filed Vitals:   03/30/14 0246 03/30/14 0500 03/30/14 0553 03/30/14 0900  BP: 168/99  143/95 178/98  Pulse: 70  78 80  Temp: 98.5 F (36.9 C)  98.3 F (36.8 C)   TempSrc: Oral  Oral   Resp: 20  20    Height:      Weight:  84.4 kg (186 lb 1.1 oz)    SpO2: 100%  100%     Exam: General: Awake and alert, confused, No acute respiratory distress Lungs: CTA b/l  Cardiovascular: Regular rate and rhythm without murmur gallop or rub normal S1 and S2 Abdomen: Nontender, nondistended, soft, bowel sounds positive, no rebound, no ascites, no appreciable mass Extremities: No significant cyanosis, clubbing, +2 edema bilateral lower extremities  Data Reviewed: Basic Metabolic Panel:  Recent Labs Lab 03/26/14 0505 03/27/14 0421 03/28/14 0455 03/29/14 0500 03/30/14 0417  NA 137 138 142 142 142  K 5.6* 4.9 4.6 4.5 4.3  CL 101 103 103 103 104  CO2 33*  GLUCOSE 122* 171* 99 156* 112*  BUN 96* 100* 88* 83* 77*  CREATININE 3.21* 2.86* 2.54* 2.31* 2.23*  CALCIUM 8.5 8.4 8.6 8.3* 8.5  MG  --  2.3  --   --   --    Liver Function Tests: No results for input(s): AST, ALT, ALKPHOS, BILITOT, PROT, ALBUMIN in the last 168 hours. No results for input(s): LIPASE, AMYLASE in the last 168 hours.  Recent Labs Lab 03/27/14 1125  AMMONIA 39*   CBC:  Recent Labs Lab 03/24/14 0507 03/25/14 0430 03/26/14 0505 03/30/14 0417  WBC 12.9* 12.2* 10.3 12.4*  HGB 10.4* 10.0* 10.1* 10.6*  HCT 32.9* 32.6* 32.4* 34.5*  MCV 85.5 86.0 85.5 87.3  PLT 362 369 352 373   Cardiac Enzymes:  Recent Labs Lab 03/29/14 0500  CKTOTAL 117   BNP (last 3 results)  Recent Labs  02/17/14 1030 03/11/14 2225 03/30/2014 1610  BNP 1410.9* 2715.9* 2224.3*    ProBNP (last 3 results) No results for input(s): PROBNP in the last 8760 hours.  CBG:  Recent Labs Lab 03/29/14 0756 03/29/14 1141 03/29/14 1757 03/29/14 2139 03/30/14 0800  GLUCAP 158* 177* 118* 175* 131*    Recent Results (from the past 240 hour(s))  MRSA PCR Screening     Status: None   Collection Time: 04/10/2014  8:30 PM  Result Value Ref Range Status   MRSA by PCR NEGATIVE NEGATIVE Final    Comment:        The GeneXpert MRSA  Assay (FDA approved for NASAL specimens only), is one component of a comprehensive MRSA colonization surveillance program. It is not intended to diagnose MRSA infection nor to guide or monitor treatment for MRSA infections.   Culture, blood (routine x 2)     Status: None   Collection Time: 03/22/14 12:06 PM  Result Value Ref Range Status   Specimen Description BLOOD RIGHT ARM  10 ML IN [facility] BOTTLE  Final   Special Requests NONE  Final   Culture   Final    NO GROWTH 5 DAYS Performed at [facility]    Report Status 03/28/2014 FINAL  Final  Urine culture     Status: None   Collection Time: 03/22/14  3:24 PM  Result Value Ref Range Status   Specimen Description URINE, CATHETERIZED  Final   Special Requests NONE  Final   Colony Count NO GROWTH Performed at [facility]   Final   Culture NO GROWTH Performed at [facility]   Final   Report Status 03/24/2014 FINAL  Final  Culture, blood (routine x 2)     Status: None (Preliminary result)   Collection Time: 03/22/14  3:30 PM  Result Value Ref Range Status   Specimen Description BLOOD RIGHT HAND  Final   Special Requests BOTTLES DRAWN AEROBIC AND ANAEROBIC 5CC EACH  Final   Culture   Final           BLOOD CULTURE RECEIVED NO GROWTH TO DATE CULTURE WILL BE HELD FOR 5 DAYS BEFORE ISSUING A FINAL NEGATIVE REPORT Performed at [facility]    Report Status PENDING  Incomplete     Studies:  Recent x-ray studies have been reviewed in detail by the Attending Physician  Scheduled Meds:  Scheduled Meds: . amLODipine  10 mg Oral Daily  . antiseptic oral rinse  7 mL Mouth Rinse BID  . aspirin EC  81 mg Oral Daily  . atorvastatin  80 mg Oral q1800  . DAPTOmycin (CUBICIN)  IV  400 mg Intravenous Q48H  . feeding supplement (PRO-STAT SUGAR FREE 64)  30 mL Oral Daily  . furosemide  80 mg Intravenous Q6H  . hydrALAZINE  100 mg Oral 3 times per day  . insulin aspart  0-20 Units Subcutaneous TID WC  .  insulin aspart  0-5 Units Subcutaneous QHS  . insulin detemir  20 Units Subcutaneous QHS  . isosorbide mononitrate  30 mg Oral Daily  . labetalol  10 mg Intravenous Q4H  . metoprolol tartrate  50 mg Oral BID  . polyethylene glycol  17 g Oral Daily  . protein supplement  1 scoop Oral BID WC  . rifampin  300 mg Oral Q12H  . sodium chloride  3 mL Intravenous Q12H  . vitamin C  500 mg Oral BID   Continuous Infusions:   Time spent on care of this patient: 35 min   Kafi Dotter, MD 03/30/2014, 11:56 AM  LOS: 9 days   [facility] Office  [phone] Pager - Text Page per [email or web address]  If 7PM-7AM, please contact night-coverage [email or web address]

## 2014-03-30 NOTE — Progress Notes (Signed)
Pharmacy called. Unclear why patient on both metoprolol and labetalol.  IV labetalol ordered by IM. D/w Dr. Donnie Ahoilley who prefers the patient is on 1 BB rather than co-managing both. He recommends d/c IV labetalol and continue metoprolol. Metoprolol held this AM in light of all of this - have asked pharmacy to go ahead and give dose since BP running higher. If she needs additional BP control would further titrate metoprolol. Epimenio Schetter PA-C

## 2014-03-30 NOTE — Progress Notes (Signed)
Subjective:  Rambling history difficult to obtain, mildly confused.  He is diuresing with weight down some.  No overt shortness of breath today.  Objective:  Vital Signs in the last 24 hours: BP 143/95 mmHg  Pulse 78  Temp(Src) 98.3 F (36.8 C) (Oral)  Resp 20  Ht 5\' 5"  (1.651 m)  Wt 84.4 kg (186 lb 1.1 oz)  BMI 30.96 kg/m2  SpO2 100%  Physical Exam: Middle-aged black male somewhat rambling historian, mumbling and confused Lungs: Rhonchi bilaterally  Cardiac:  Regular rhythm, normal S1 and S2, no S3, neck veins up some Extremities: 1-2+ edema edema present  Intake/Output from previous day: 02/13 0701 - 02/14 0700 In: 1167 [P.O.:840; I.V.:3; IV Piggyback:324] Out: 3350 [Urine:3350] Weight Filed Weights   03/28/14 0417 03/29/14 0541 03/30/14 0500  Weight: 88.4 kg (194 lb 14.2 oz) 87.8 kg (193 lb 9 oz) 84.4 kg (186 lb 1.1 oz)    Lab Results: Basic Metabolic Panel:  Recent Labs  16/11/9600/13/16 0500 03/30/14 0417  NA 142 142  K 4.5 4.3  CL 103 104  CO2 31 33*  GLUCOSE 156* 112*  BUN 83* 77*  CREATININE 2.31* 2.23*   BNP    Component Value Date/Time   BNP 2224.3* 2014/12/12 1610   Telemetry: Atrial paced ventricular sensed rhythm PVC"s  Assessment/Plan:  1.  Acute on chronic diastolic heart failure-some diuresis overnight 2.  Stage IV chronic kidney disease-currently stable creatinine down some 3.  Encephalopathy 4.  Coronary artery disease with previous bypass grafting 5.  Previous permanent pacemaker with current atrial pacing 6.  History of graft infection on antibiotic treatment  Recommendations:  Continue diuresis.  Looks ill and encephalopathic.     Darden PalmerW. Spencer Rondell Pardon, Jr.  MD [facility]  03/30/2014, 9:01 AM

## 2014-03-30 NOTE — Progress Notes (Signed)
Patient pulled out PICC line. PCP on call notified

## 2014-03-31 MED FILL — Medication: Qty: 1 | Status: AC

## 2014-04-02 ENCOUNTER — Encounter: Payer: Medicare HMO | Admitting: Vascular Surgery

## 2014-04-03 ENCOUNTER — Inpatient Hospital Stay: Payer: Medicare HMO | Admitting: Infectious Disease

## 2014-04-04 ENCOUNTER — Other Ambulatory Visit: Payer: Medicare HMO | Admitting: Internal Medicine

## 2014-04-14 ENCOUNTER — Ambulatory Visit: Payer: Medicare HMO | Admitting: Family Medicine

## 2014-04-15 NOTE — Progress Notes (Signed)
The patient's monitor was reading Asystole. RN checked on patient. Pt. was pulseless. Compressions were started. The medical emergency code was sounded. Code team came to the code and a code was run from 0305. Numerous cycles of compressions were done. Patient was shocked. Patient was intubated. IO access was gained. The family was contacted. MD ran the code until 0352 until the code was ended.

## 2014-04-15 NOTE — Progress Notes (Signed)
RN spoke with [name] on the phone regarding the patient's belongings.  Mrs. [name] stated that she will have someone pick up the belongings when the roads are a little clearer. Just now the roads are icy . The belongings will be labelled and placed in the conference room cabinet.

## 2014-04-15 NOTE — Discharge Summary (Addendum)
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 Exact reason for death is unclear. He had pulled out his PICC line earlier in the evening for the second time during the hospital stay.The patient had a pacemaker which did not capture during the asystole.  Time of death was [id number]. Below is my last progress note from 2/14.    Brief narrative: [name] is a [age] y.o. 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  He comes in for shortness of breath and hypoxia. For the last 2 days he has not been able to lay flat to sleep and can not walk to the bathroom without getting short of breath. CXR reveals pulm edema. Has chronic pedal edema that was unchanged. Found to have a fever in the ER as well. Temp was 101 on 2/6.   Assessment/Plan: Principal Problem:  Acute respiratory failure with hypoxia- acute on chronic diastolic CHF- mild troponin elevation without chest  pain  - suspect acute diastolic CHF-  - repeat CXR shows continued edema- increased Lasix to TID - obtain strict control of BP  - repeated ECHO - mod LVH with elevated LV filling pressure - cont to give Lasix- pulled PICC out on 2/12- have replaced- cont IV Lasix   Active Problems: Sepsis- fever on admission, tachypnea (due to CHF likely), leukocytosis  - source uncertain- cont antibiotics for right groin / graft infection- Dapto and Rifampin - PICC line pulled by patient was replaced  - fever, leukocytosis and tachypnea have all resolved   Acute encephalopathy - cont to follow- cause is still uncertain - checked CO2 level with ABG and Ammonia which were both normal - will get CT head and B12 level today   Essential hypertension - d/c Bidil- start higher dose of Hydralazine and Imdur at 30 mg,  - add Norvasc - cont Metoprolol - added Clonidine on 2/10   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

## 2014-04-15 NOTE — ED Provider Notes (Addendum)
Pt with extensive cardiac history with bypass grafting, diabetes and hypertension. Recently admitted for hypoxia thought to be due to pulmonary edema. Also noted to be febrile. As of late the patient has had increased confusion recently pulled his PICC line. Called to [facility] after RN initiated CODE BLUE upon finding the patient unresponsive and in asystole. This was at 0305. CPR initiated by staff and was in progress when I arrived at 0310. Pt was intubated by anesthesia with bl breath sounds. Initial rhythm appeared to be asystole vs very fine vfib. After several rounds of epi, pt was shocked with change of rhythm to PEA. He then reverted to fine ventricular fibrilaation/asystole. Was given amiodarone and 2 amp of bicarb. Pt oscillated between these rhythms but never regained palpable pulses. Multiple doses of epinephrine were administered through an IO which I placed in the patient's proximal tibia on the left as well as IV fluids. I discussed discontinuation of CPR with the patient's wife at 0350 after 45 minutes of CPR. She agreed with plan to discontinue further effort. Vital rhythm check patient was in PEA with no signs of life. Time of death was called at [id number].   Loren Raceravid Teren Zurcher, MD 17-Jun-2014 0413   CRITICAL CARE Performed by: Loren RacerYELVERTON, Lindsee Labarre Total critical care time:42 min Critical care time was exclusive of separately billable procedures and treating other patients. Critical care was necessary to treat or prevent imminent or life-threatening deterioration. Critical care was time spent personally by me on the following activities: development of treatment plan with patient and/or surrogate as well as nursing, discussions with consultants, evaluation of patient's response to treatment, examination of patient, obtaining history from patient or surrogate, ordering and performing treatments and interventions, ordering and review of laboratory studies, ordering and review of radiographic studies, pulse  oximetry and re-evaluation of patient's condition.   Loren Raceravid Sahana Boyland, MD 17-Jun-2014 [phone]

## 2014-04-15 NOTE — Progress Notes (Signed)
[facility]  [facility] was [facility].

## 2014-04-15 DEATH — deceased

## 2014-04-23 ENCOUNTER — Ambulatory Visit: Payer: Medicare HMO | Admitting: Vascular Surgery

## 2014-04-23 ENCOUNTER — Encounter (HOSPITAL_COMMUNITY): Payer: Medicare HMO

## 2015-11-14 IMAGING — CR DG CHEST 1V PORT
1 series · 1 of 1 positions shown · non-contrast
Comparison: Portable chest x-ray dated February 12, 2014

CLINICAL DATA: Lethargy and disorientation; history of coronary
artery disease and peripheral vascular disease, hypertension, and
diabetes

EXAM:
PORTABLE CHEST - 1 VIEW

[AP]
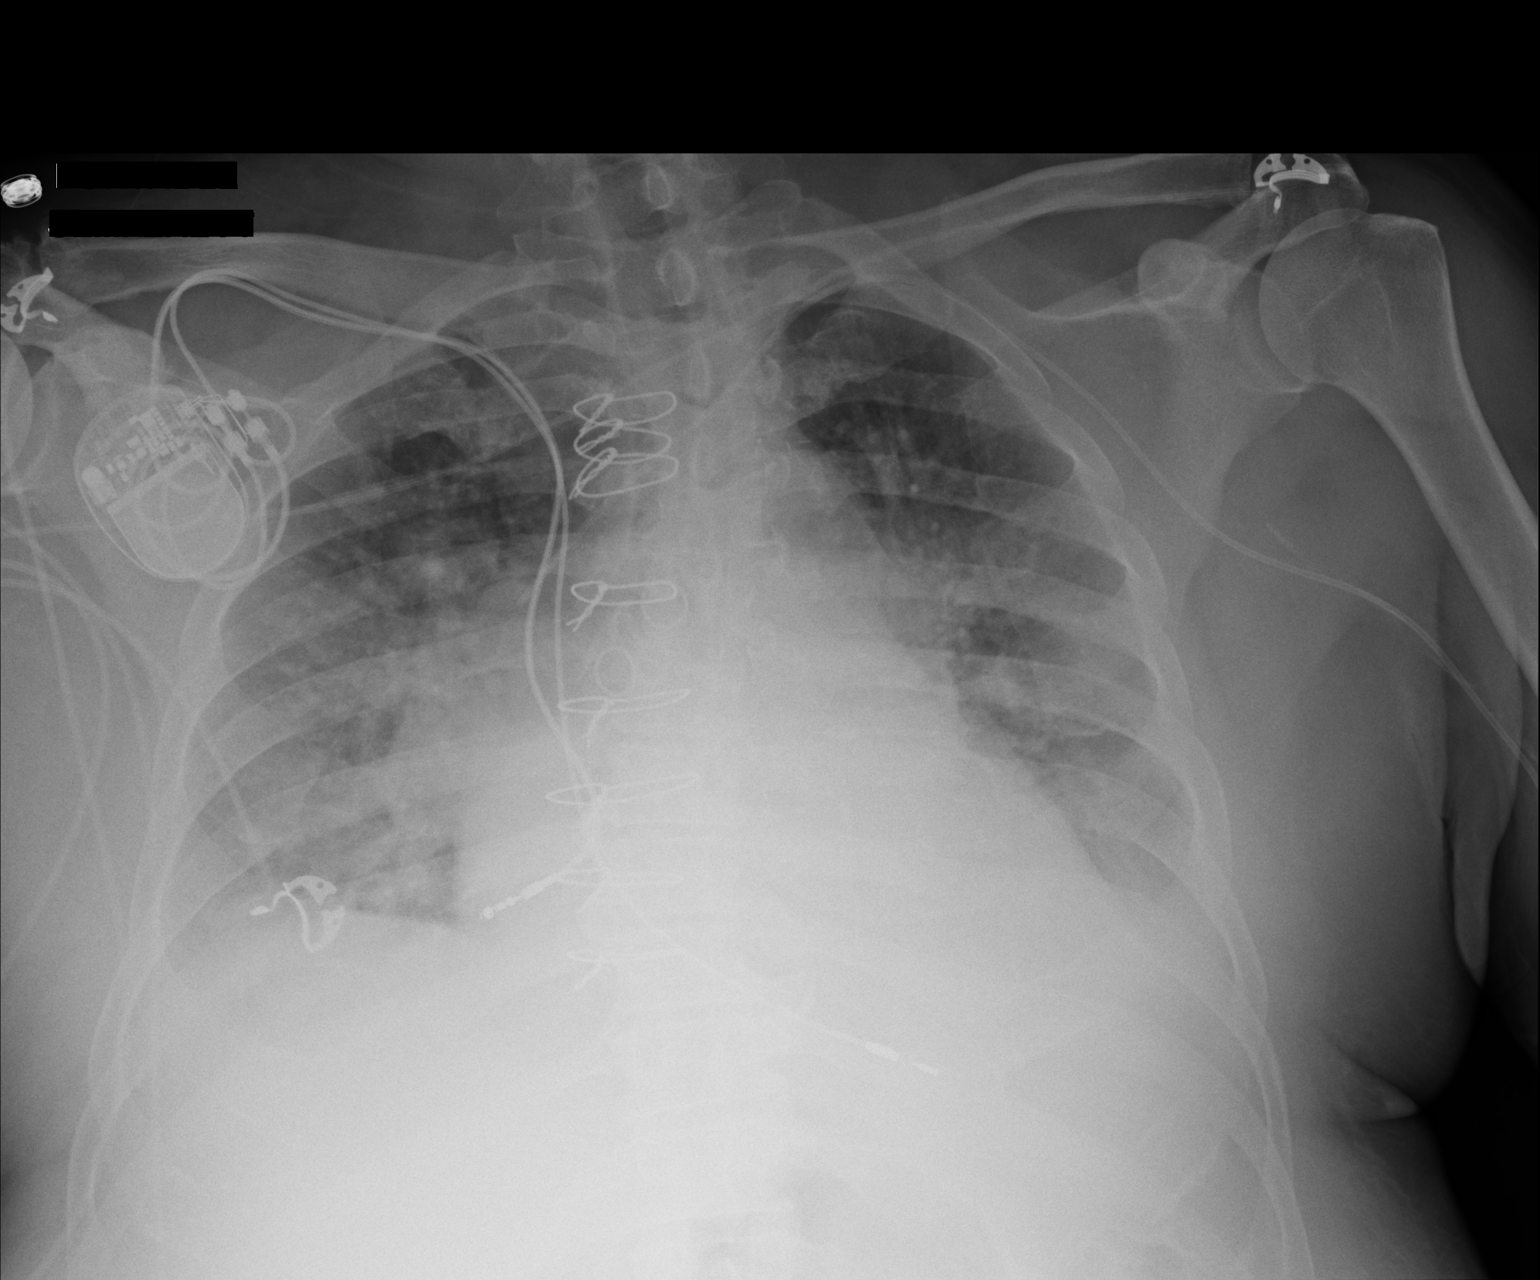

[1 of 1 positions shown; findings below may reference images not displayed]

FINDINGS: The lungs are mildly hypoinflated. The interstitial markings are
increased bilaterally. The cardiac silhouette is enlarged. The
pulmonary vascularity is engorged. The retrocardiac region is dense.
The hemidiaphragms are mildly blunted. The permanent pacemaker is in
appropriate position radiographically. There are post CABG changes.
IMPRESSION: Congestive heart failure with moderate interstitial edema and small
bilateral pleural effusions.

## 2015-11-16 IMAGING — CR DG CHEST 1V PORT
1 series · 1 of 1 positions shown · non-contrast
Comparison: 02/24/2014

CLINICAL DATA: Shortness of Breath

EXAM:
PORTABLE CHEST - 1 VIEW

[AP]
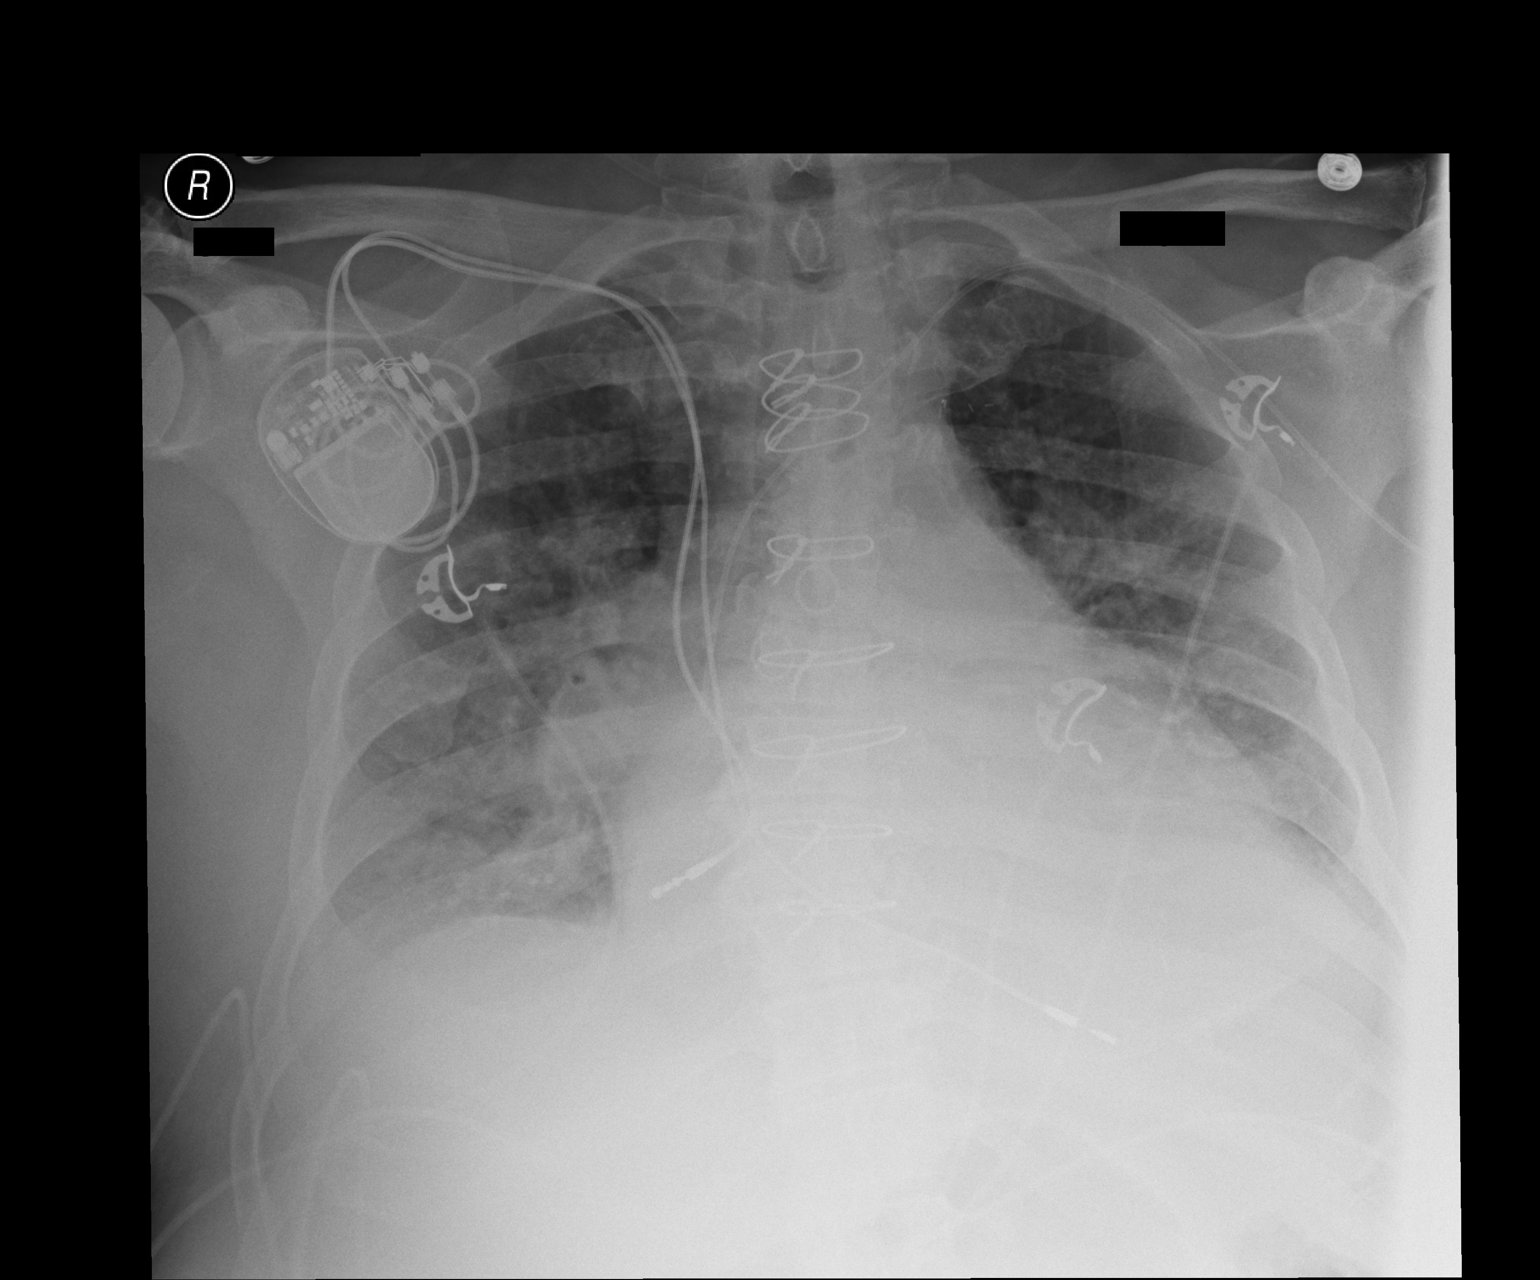

[1 of 1 positions shown; findings below may reference images not displayed]

FINDINGS: Cardiomegaly again noted. Status post CABG. Persistent mild
interstitial edema bilaterally. Probable small bilateral pleural
effusion with bilateral basilar atelectasis or infiltrate. Dual lead
cardiac pacemaker is unchanged in position. Stable left arm PICC
line position with tip in distal SVC.
IMPRESSION: Persistent mild interstitial edema bilaterally. Cardiomegaly again
noted. Small bilateral pleural effusion with bilateral basilar
atelectasis or infiltrate. Status post CABG. Dual lead cardiac
pacemaker is unchanged in position.
